# Patient Record
Sex: Female | Born: 1960 | Race: Black or African American | Hispanic: No | Marital: Married | Smoking: Former smoker
Health system: Southern US, Community
[De-identification: ages and names within clinical notes are randomized; demographics above are authoritative.]

## PROBLEM LIST (undated history)

## (undated) DIAGNOSIS — K635 Polyp of colon: Secondary | ICD-10-CM

## (undated) DIAGNOSIS — D126 Benign neoplasm of colon, unspecified: Secondary | ICD-10-CM

## (undated) DIAGNOSIS — D649 Anemia, unspecified: Secondary | ICD-10-CM

## (undated) DIAGNOSIS — I729 Aneurysm of unspecified site: Secondary | ICD-10-CM

## (undated) DIAGNOSIS — M1990 Unspecified osteoarthritis, unspecified site: Secondary | ICD-10-CM

## (undated) DIAGNOSIS — I10 Essential (primary) hypertension: Secondary | ICD-10-CM

## (undated) DIAGNOSIS — I471 Supraventricular tachycardia, unspecified: Secondary | ICD-10-CM

## (undated) DIAGNOSIS — I209 Angina pectoris, unspecified: Secondary | ICD-10-CM

## (undated) DIAGNOSIS — Z96651 Presence of right artificial knee joint: Secondary | ICD-10-CM

## (undated) DIAGNOSIS — C801 Malignant (primary) neoplasm, unspecified: Secondary | ICD-10-CM

## (undated) DIAGNOSIS — Z96652 Presence of left artificial knee joint: Secondary | ICD-10-CM

## (undated) DIAGNOSIS — R002 Palpitations: Secondary | ICD-10-CM

## (undated) DIAGNOSIS — J189 Pneumonia, unspecified organism: Secondary | ICD-10-CM

## (undated) DIAGNOSIS — E039 Hypothyroidism, unspecified: Principal | ICD-10-CM

## (undated) DIAGNOSIS — R112 Nausea with vomiting, unspecified: Secondary | ICD-10-CM

## (undated) DIAGNOSIS — M069 Rheumatoid arthritis, unspecified: Secondary | ICD-10-CM

## (undated) DIAGNOSIS — F419 Anxiety disorder, unspecified: Secondary | ICD-10-CM

## (undated) DIAGNOSIS — F329 Major depressive disorder, single episode, unspecified: Secondary | ICD-10-CM

## (undated) DIAGNOSIS — Z803 Family history of malignant neoplasm of breast: Secondary | ICD-10-CM

## (undated) DIAGNOSIS — Z9889 Other specified postprocedural states: Secondary | ICD-10-CM

## (undated) HISTORY — DX: Unspecified osteoarthritis, unspecified site: M19.90

## (undated) HISTORY — PX: BUNIONECTOMY: SHX129

## (undated) HISTORY — DX: Anemia, unspecified: D64.9

## (undated) HISTORY — DX: Benign neoplasm of colon, unspecified: D12.6

## (undated) HISTORY — DX: Palpitations: R00.2

## (undated) HISTORY — DX: Aneurysm of unspecified site: I72.9

## (undated) HISTORY — DX: Presence of left artificial knee joint: Z96.652

## (undated) HISTORY — DX: Family history of malignant neoplasm of breast: Z80.3

## (undated) HISTORY — DX: Polyp of colon: K63.5

## (undated) HISTORY — DX: Hypothyroidism, unspecified: E03.9

## (undated) HISTORY — DX: Presence of right artificial knee joint: Z96.651

## (undated) HISTORY — PX: ABDOMINAL HYSTERECTOMY: SHX81

## (undated) HISTORY — DX: Major depressive disorder, single episode, unspecified: F32.9

## (undated) HISTORY — PX: OTHER SURGICAL HISTORY: SHX169

## (undated) HISTORY — DX: Supraventricular tachycardia: I47.1

## (undated) HISTORY — PX: INGUINAL HERNIA REPAIR: SHX194

## (undated) HISTORY — PX: APPENDECTOMY: SHX54

## (undated) HISTORY — PX: HAMMER TOE SURGERY: SHX385

---
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
## 1999-09-20 HISTORY — PX: THYROIDECTOMY: SHX17

## 2004-05-10 ENCOUNTER — Emergency Department (HOSPITAL_COMMUNITY): Admission: EM | Admit: 2004-05-10 | Discharge: 2004-05-10 | Payer: Self-pay | Admitting: Emergency Medicine

## 2005-01-03 ENCOUNTER — Ambulatory Visit (HOSPITAL_COMMUNITY): Admission: RE | Admit: 2005-01-03 | Discharge: 2005-01-04 | Payer: Self-pay | Admitting: Neurosurgery

## 2006-03-09 ENCOUNTER — Emergency Department (HOSPITAL_COMMUNITY): Admission: EM | Admit: 2006-03-09 | Discharge: 2006-03-09 | Payer: Self-pay | Admitting: Family Medicine

## 2007-06-25 ENCOUNTER — Emergency Department (HOSPITAL_COMMUNITY): Admission: EM | Admit: 2007-06-25 | Discharge: 2007-06-25 | Payer: Self-pay | Admitting: Emergency Medicine

## 2007-08-14 ENCOUNTER — Emergency Department (HOSPITAL_COMMUNITY): Admission: EM | Admit: 2007-08-14 | Discharge: 2007-08-14 | Payer: Self-pay | Admitting: Emergency Medicine

## 2007-08-20 ENCOUNTER — Inpatient Hospital Stay (HOSPITAL_COMMUNITY): Admission: AD | Admit: 2007-08-20 | Discharge: 2007-08-20 | Payer: Self-pay | Admitting: Obstetrics and Gynecology

## 2007-09-07 ENCOUNTER — Emergency Department (HOSPITAL_COMMUNITY): Admission: EM | Admit: 2007-09-07 | Discharge: 2007-09-07 | Payer: Self-pay | Admitting: Sports Medicine

## 2007-09-26 ENCOUNTER — Ambulatory Visit: Payer: Self-pay | Admitting: Obstetrics and Gynecology

## 2007-10-17 ENCOUNTER — Ambulatory Visit: Payer: Self-pay | Admitting: Internal Medicine

## 2007-10-17 DIAGNOSIS — E039 Hypothyroidism, unspecified: Secondary | ICD-10-CM

## 2007-10-17 HISTORY — DX: Hypothyroidism, unspecified: E03.9

## 2007-10-18 LAB — CONVERTED CEMR LAB
ALT: 14 units/L (ref 0–35)
AST: 15 units/L (ref 0–37)
Albumin: 4 g/dL (ref 3.5–5.2)
Basophils Absolute: 0 10*3/uL (ref 0.0–0.1)
Bilirubin, Direct: 0.2 mg/dL (ref 0.0–0.3)
Calcium: 9.2 mg/dL (ref 8.4–10.5)
Chloride: 104 meq/L (ref 96–112)
Eosinophils Absolute: 0.1 10*3/uL (ref 0.0–0.6)
Eosinophils Relative: 1.8 % (ref 0.0–5.0)
GFR calc non Af Amer: 97 mL/min
HCT: 35.7 % — ABNORMAL LOW (ref 36.0–46.0)
Hemoglobin: 11.7 g/dL — ABNORMAL LOW (ref 12.0–15.0)
Lymphocytes Relative: 32.7 % (ref 12.0–46.0)
MCV: 86.5 fL (ref 78.0–100.0)
Monocytes Relative: 4.9 % (ref 3.0–11.0)
Platelets: 394 10*3/uL (ref 150–400)
RBC: 4.13 M/uL (ref 3.87–5.11)
RDW: 13.2 % (ref 11.5–14.6)
WBC: 7.3 10*3/uL (ref 4.5–10.5)

## 2007-10-23 ENCOUNTER — Encounter: Payer: Self-pay | Admitting: Internal Medicine

## 2007-10-23 ENCOUNTER — Ambulatory Visit: Payer: Self-pay

## 2007-10-30 ENCOUNTER — Telehealth: Payer: Self-pay | Admitting: Internal Medicine

## 2007-11-01 ENCOUNTER — Telehealth (INDEPENDENT_AMBULATORY_CARE_PROVIDER_SITE_OTHER): Payer: Self-pay | Admitting: *Deleted

## 2007-11-08 ENCOUNTER — Ambulatory Visit: Payer: Self-pay | Admitting: Cardiology

## 2007-12-13 ENCOUNTER — Ambulatory Visit: Payer: Self-pay | Admitting: Internal Medicine

## 2007-12-18 ENCOUNTER — Ambulatory Visit: Payer: Self-pay | Admitting: Internal Medicine

## 2008-01-25 ENCOUNTER — Ambulatory Visit: Payer: Self-pay | Admitting: Cardiology

## 2008-02-04 ENCOUNTER — Telehealth: Payer: Self-pay | Admitting: Internal Medicine

## 2008-02-05 ENCOUNTER — Ambulatory Visit: Payer: Self-pay | Admitting: Internal Medicine

## 2008-02-13 LAB — CONVERTED CEMR LAB
Albumin: 3.8 g/dL (ref 3.5–5.2)
Alkaline Phosphatase: 38 units/L — ABNORMAL LOW (ref 39–117)
Bilirubin, Direct: 0.1 mg/dL (ref 0.0–0.3)
CO2: 30 meq/L (ref 19–32)
GFR calc Af Amer: 116 mL/min
Glucose, Bld: 86 mg/dL (ref 70–99)
HCT: 34.8 % — ABNORMAL LOW (ref 36.0–46.0)
Iron: 28 ug/dL — ABNORMAL LOW (ref 42–145)
Lymphocytes Relative: 32.4 % (ref 12.0–46.0)
MCHC: 32.3 g/dL (ref 30.0–36.0)
Monocytes Absolute: 0.4 10*3/uL (ref 0.1–1.0)
Monocytes Relative: 6.4 % (ref 3.0–12.0)
Neutrophils Relative %: 59.1 % (ref 43.0–77.0)
Potassium: 4 meq/L (ref 3.5–5.1)
RDW: 14.6 % (ref 11.5–14.6)
Sodium: 140 meq/L (ref 135–145)
TSH: 17.67 microintl units/mL — ABNORMAL HIGH (ref 0.35–5.50)
Total Protein: 6.8 g/dL (ref 6.0–8.3)
Transferrin: 348.7 mg/dL (ref >=212.0)

## 2008-03-20 ENCOUNTER — Ambulatory Visit: Payer: Self-pay | Admitting: Obstetrics & Gynecology

## 2008-03-20 ENCOUNTER — Ambulatory Visit (HOSPITAL_COMMUNITY): Admission: RE | Admit: 2008-03-20 | Discharge: 2008-03-20 | Payer: Self-pay | Admitting: Obstetrics & Gynecology

## 2008-03-24 ENCOUNTER — Ambulatory Visit (HOSPITAL_COMMUNITY): Admission: RE | Admit: 2008-03-24 | Discharge: 2008-03-24 | Payer: Self-pay | Admitting: Obstetrics & Gynecology

## 2008-03-24 ENCOUNTER — Encounter: Payer: Self-pay | Admitting: Obstetrics & Gynecology

## 2008-03-24 ENCOUNTER — Ambulatory Visit: Payer: Self-pay | Admitting: Obstetrics & Gynecology

## 2008-03-26 ENCOUNTER — Ambulatory Visit: Payer: Self-pay | Admitting: Internal Medicine

## 2008-03-30 ENCOUNTER — Telehealth: Payer: Self-pay | Admitting: Internal Medicine

## 2008-04-02 ENCOUNTER — Ambulatory Visit: Payer: Self-pay | Admitting: Internal Medicine

## 2008-04-03 LAB — CONVERTED CEMR LAB
Lymphocytes Relative: 27.8 % (ref 12.0–46.0)
MCHC: 32.1 g/dL (ref 30.0–36.0)
Monocytes Absolute: 0.5 10*3/uL (ref 0.1–1.0)
Monocytes Relative: 9.3 % (ref 3.0–12.0)
Neutro Abs: 3.6 10*3/uL (ref 1.4–7.7)

## 2008-04-16 ENCOUNTER — Ambulatory Visit (HOSPITAL_COMMUNITY): Admission: RE | Admit: 2008-04-16 | Discharge: 2008-04-16 | Payer: Self-pay | Admitting: Obstetrics & Gynecology

## 2008-04-16 ENCOUNTER — Ambulatory Visit: Payer: Self-pay | Admitting: Internal Medicine

## 2008-04-16 LAB — CONVERTED CEMR LAB
Basophils Absolute: 0 10*3/uL (ref 0.0–0.1)
Eosinophils Absolute: 0.1 10*3/uL (ref 0.0–0.7)
HCT: 34 % — ABNORMAL LOW (ref 36.0–46.0)
Hemoglobin: 11.4 g/dL — ABNORMAL LOW (ref 12.0–15.0)
Iron: 130 ug/dL (ref 42–145)
MCHC: 33.6 g/dL (ref 30.0–36.0)
MCV: 86.7 fL (ref 78.0–100.0)
Monocytes Absolute: 0.4 10*3/uL (ref 0.1–1.0)
Monocytes Relative: 5.6 % (ref 3.0–12.0)
Platelets: 375 10*3/uL (ref 150–400)
RDW: 14.5 % (ref 11.5–14.6)
Saturation Ratios: 29.2 % (ref 20.0–50.0)

## 2008-04-17 ENCOUNTER — Ambulatory Visit: Payer: Self-pay | Admitting: Obstetrics and Gynecology

## 2008-04-23 ENCOUNTER — Ambulatory Visit: Payer: Self-pay | Admitting: Internal Medicine

## 2008-04-23 DIAGNOSIS — F329 Major depressive disorder, single episode, unspecified: Secondary | ICD-10-CM | POA: Insufficient documentation

## 2008-04-23 DIAGNOSIS — F3289 Other specified depressive episodes: Secondary | ICD-10-CM

## 2008-04-23 DIAGNOSIS — D649 Anemia, unspecified: Secondary | ICD-10-CM

## 2008-04-23 HISTORY — DX: Major depressive disorder, single episode, unspecified: F32.9

## 2008-04-23 HISTORY — DX: Other specified depressive episodes: F32.89

## 2008-04-23 HISTORY — DX: Anemia, unspecified: D64.9

## 2008-04-28 ENCOUNTER — Encounter: Payer: Self-pay | Admitting: Internal Medicine

## 2008-05-15 ENCOUNTER — Ambulatory Visit: Payer: Self-pay | Admitting: Obstetrics & Gynecology

## 2008-06-22 ENCOUNTER — Emergency Department (HOSPITAL_COMMUNITY): Admission: EM | Admit: 2008-06-22 | Discharge: 2008-06-22 | Payer: Self-pay | Admitting: Emergency Medicine

## 2008-06-23 ENCOUNTER — Telehealth: Payer: Self-pay | Admitting: Internal Medicine

## 2008-06-24 ENCOUNTER — Ambulatory Visit: Payer: Self-pay | Admitting: Internal Medicine

## 2008-06-30 ENCOUNTER — Other Ambulatory Visit: Payer: Self-pay | Admitting: Obstetrics and Gynecology

## 2008-07-01 ENCOUNTER — Encounter: Payer: Self-pay | Admitting: Obstetrics & Gynecology

## 2008-07-01 ENCOUNTER — Ambulatory Visit: Payer: Self-pay | Admitting: Obstetrics & Gynecology

## 2008-07-01 ENCOUNTER — Inpatient Hospital Stay (HOSPITAL_COMMUNITY): Admission: RE | Admit: 2008-07-01 | Discharge: 2008-07-02 | Payer: Self-pay | Admitting: Obstetrics & Gynecology

## 2008-07-18 ENCOUNTER — Ambulatory Visit: Payer: Self-pay | Admitting: Obstetrics & Gynecology

## 2008-08-19 ENCOUNTER — Ambulatory Visit: Payer: Self-pay | Admitting: Internal Medicine

## 2008-08-21 LAB — CONVERTED CEMR LAB
AST: 18 units/L (ref 0–37)
Bilirubin, Direct: 0.1 mg/dL (ref 0.0–0.3)
Chloride: 105 meq/L (ref 96–112)
Eosinophils Absolute: 0.1 10*3/uL (ref 0.0–0.7)
Eosinophils Relative: 0.8 % (ref 0.0–5.0)
GFR calc Af Amer: 100 mL/min
GFR calc non Af Amer: 82 mL/min
MCV: 84.1 fL (ref 78.0–100.0)
Monocytes Absolute: 0.3 10*3/uL (ref 0.1–1.0)
Potassium: 3.8 meq/L (ref 3.5–5.1)
Rhuematoid fact SerPl-aCnc: <20 intl units/mL — ABNORMAL LOW (ref 0.0–20.0)
Sodium: 138 meq/L (ref 135–145)
Total Bilirubin: 0.5 mg/dL (ref 0.3–1.2)
Total Protein: 7.1 g/dL (ref 6.0–8.3)
WBC: 7.2 10*3/uL (ref 4.5–10.5)

## 2008-09-04 ENCOUNTER — Encounter: Payer: Self-pay | Admitting: Internal Medicine

## 2008-11-06 ENCOUNTER — Encounter: Payer: Self-pay | Admitting: Internal Medicine

## 2008-12-23 ENCOUNTER — Emergency Department (HOSPITAL_COMMUNITY): Admission: EM | Admit: 2008-12-23 | Discharge: 2008-12-23 | Payer: Self-pay | Admitting: Emergency Medicine

## 2009-02-10 ENCOUNTER — Encounter: Payer: Self-pay | Admitting: Internal Medicine

## 2009-03-02 ENCOUNTER — Encounter: Payer: Self-pay | Admitting: Internal Medicine

## 2009-04-13 ENCOUNTER — Ambulatory Visit: Payer: Self-pay | Admitting: Internal Medicine

## 2009-04-14 ENCOUNTER — Telehealth: Payer: Self-pay | Admitting: Internal Medicine

## 2009-04-15 LAB — CONVERTED CEMR LAB
AST: 22 units/L (ref 0–37)
Alkaline Phosphatase: 37 units/L — ABNORMAL LOW (ref 39–117)
BUN: 11 mg/dL (ref 6–23)
Basophils Absolute: 0.1 10*3/uL (ref 0.0–0.1)
Basophils Relative: 0.8 % (ref 0.0–3.0)
Bilirubin, Direct: 0.1 mg/dL (ref 0.0–0.3)
Creatinine, Ser: 0.7 mg/dL (ref 0.4–1.2)
Eosinophils Absolute: 0.1 10*3/uL (ref 0.0–0.7)
GFR calc non Af Amer: 115.63 mL/min (ref >60)
HCT: 37.9 % (ref 36.0–46.0)
Hemoglobin: 12.6 g/dL (ref 12.0–15.0)
Lymphocytes Relative: 24.3 % (ref 12.0–46.0)
MCV: 88.6 fL (ref 78.0–100.0)
Monocytes Absolute: 0.4 10*3/uL (ref 0.1–1.0)
Neutro Abs: 4.8 10*3/uL (ref 1.4–7.7)
Neutrophils Relative %: 68.5 % (ref 43.0–77.0)
Platelets: 344 10*3/uL (ref 150.0–400.0)
TSH: 6.64 microintl units/mL — ABNORMAL HIGH (ref 0.35–5.50)
WBC: 7.1 10*3/uL (ref 4.5–10.5)

## 2009-04-28 ENCOUNTER — Telehealth: Payer: Self-pay | Admitting: Internal Medicine

## 2009-05-29 ENCOUNTER — Encounter: Payer: Self-pay | Admitting: Internal Medicine

## 2009-06-01 ENCOUNTER — Encounter: Admission: RE | Admit: 2009-06-01 | Discharge: 2009-06-01 | Payer: Self-pay | Admitting: Rheumatology

## 2009-06-29 ENCOUNTER — Ambulatory Visit: Payer: Self-pay | Admitting: Family Medicine

## 2009-06-29 DIAGNOSIS — R634 Abnormal weight loss: Secondary | ICD-10-CM | POA: Insufficient documentation

## 2009-06-29 LAB — CONVERTED CEMR LAB
Blood Glucose, Fingerstick: 91
Glucose, Urine, Semiquant: NEGATIVE
Specific Gravity, Urine: >=1.03
Urobilinogen, UA: 1
pH: 5

## 2009-07-01 LAB — CONVERTED CEMR LAB
ALT: 9 units/L (ref 0–35)
AST: 13 units/L (ref 0–37)
Albumin: 4.1 g/dL (ref 3.5–5.2)
Alkaline Phosphatase: 36 units/L — ABNORMAL LOW (ref 39–117)
Basophils Absolute: 0.1 10*3/uL (ref 0.0–0.1)
Basophils Relative: 1.1 % (ref 0.0–3.0)
Bilirubin, Direct: 0.1 mg/dL (ref 0.0–0.3)
CO2: 28 meq/L (ref 19–32)
Eosinophils Absolute: 0 10*3/uL (ref 0.0–0.7)
Eosinophils Relative: 0.8 % (ref 0.0–5.0)
GFR calc non Af Amer: 99.02 mL/min (ref >60)
Hemoglobin: 13.5 g/dL (ref 12.0–15.0)
Lymphs Abs: 2 10*3/uL (ref 0.7–4.0)
MCHC: 33.1 g/dL (ref 30.0–36.0)
Monocytes Absolute: 0.4 10*3/uL (ref 0.1–1.0)
Neutro Abs: 3.5 10*3/uL (ref 1.4–7.7)
Potassium: 4.2 meq/L (ref 3.5–5.1)
RBC: 4.52 M/uL (ref 3.87–5.11)
Sed Rate: 22 mm/hr (ref 0–22)
Sodium: 142 meq/L (ref 135–145)
Total Protein: 7.2 g/dL (ref 6.0–8.3)
WBC: 6 10*3/uL (ref 4.5–10.5)

## 2009-07-02 ENCOUNTER — Ambulatory Visit: Payer: Self-pay | Admitting: Family Medicine

## 2009-07-08 ENCOUNTER — Ambulatory Visit: Payer: Self-pay | Admitting: Internal Medicine

## 2009-07-10 ENCOUNTER — Telehealth (INDEPENDENT_AMBULATORY_CARE_PROVIDER_SITE_OTHER): Payer: Self-pay | Admitting: *Deleted

## 2009-07-13 ENCOUNTER — Encounter (INDEPENDENT_AMBULATORY_CARE_PROVIDER_SITE_OTHER): Payer: Self-pay | Admitting: *Deleted

## 2009-07-16 ENCOUNTER — Ambulatory Visit: Payer: Self-pay | Admitting: Cardiology

## 2009-07-21 ENCOUNTER — Telehealth: Payer: Self-pay | Admitting: Internal Medicine

## 2009-07-24 ENCOUNTER — Telehealth: Payer: Self-pay | Admitting: Internal Medicine

## 2009-08-04 ENCOUNTER — Emergency Department (HOSPITAL_COMMUNITY): Admission: EM | Admit: 2009-08-04 | Discharge: 2009-08-04 | Payer: Self-pay | Admitting: Emergency Medicine

## 2009-08-22 ENCOUNTER — Emergency Department (HOSPITAL_COMMUNITY): Admission: EM | Admit: 2009-08-22 | Discharge: 2009-08-22 | Payer: Self-pay | Admitting: Emergency Medicine

## 2009-09-30 ENCOUNTER — Encounter: Payer: Self-pay | Admitting: Internal Medicine

## 2009-10-27 ENCOUNTER — Ambulatory Visit: Payer: Self-pay | Admitting: Cardiology

## 2009-10-27 DIAGNOSIS — I471 Supraventricular tachycardia, unspecified: Secondary | ICD-10-CM

## 2009-10-27 HISTORY — DX: Supraventricular tachycardia, unspecified: I47.10

## 2009-10-27 HISTORY — DX: Supraventricular tachycardia: I47.1

## 2009-11-12 ENCOUNTER — Ambulatory Visit: Payer: Self-pay | Admitting: Cardiology

## 2009-11-17 LAB — CONVERTED CEMR LAB
Cholesterol: 169 mg/dL (ref 0–200)
HDL: 42.5 mg/dL (ref >39.00)
Total CHOL/HDL Ratio: 4
Triglycerides: 100 mg/dL (ref 0.0–149.0)

## 2009-11-26 ENCOUNTER — Ambulatory Visit: Payer: Self-pay | Admitting: Cardiology

## 2009-11-30 LAB — CONVERTED CEMR LAB
Free T4: 0.7 ng/dL (ref 0.6–1.6)
T3, Free: 2.5 pg/mL (ref 2.3–4.2)

## 2009-12-14 ENCOUNTER — Telehealth (INDEPENDENT_AMBULATORY_CARE_PROVIDER_SITE_OTHER): Payer: Self-pay | Admitting: *Deleted

## 2009-12-17 ENCOUNTER — Telehealth: Payer: Self-pay | Admitting: Cardiology

## 2010-01-12 ENCOUNTER — Encounter: Payer: Self-pay | Admitting: Internal Medicine

## 2010-01-13 ENCOUNTER — Encounter: Payer: Self-pay | Admitting: Internal Medicine

## 2010-01-20 ENCOUNTER — Ambulatory Visit: Payer: Self-pay | Admitting: Internal Medicine

## 2010-01-20 ENCOUNTER — Encounter (INDEPENDENT_AMBULATORY_CARE_PROVIDER_SITE_OTHER): Payer: Self-pay | Admitting: *Deleted

## 2010-01-20 LAB — CONVERTED CEMR LAB
Albumin: 3.9 g/dL (ref 3.5–5.2)
Alkaline Phosphatase: 46 units/L (ref 39–117)
BUN: 8 mg/dL (ref 6–23)
Basophils Absolute: 0 10*3/uL (ref 0.0–0.1)
Basophils Relative: 0.5 % (ref 0.0–3.0)
Calcium: 8.7 mg/dL (ref 8.4–10.5)
Eosinophils Absolute: 0.1 10*3/uL (ref 0.0–0.7)
Eosinophils Relative: 1.2 % (ref 0.0–5.0)
Hemoglobin: 12.9 g/dL (ref 12.0–15.0)
Lymphocytes Relative: 28.7 % (ref 12.0–46.0)
Lymphs Abs: 2 10*3/uL (ref 0.7–4.0)
MCHC: 33.7 g/dL (ref 30.0–36.0)
MCV: 88.3 fL (ref 78.0–100.0)
Monocytes Absolute: 0.3 10*3/uL (ref 0.1–1.0)
Monocytes Relative: 5.1 % (ref 3.0–12.0)
Potassium: 4.3 meq/L (ref 3.5–5.1)
RDW: 13.2 % (ref 11.5–14.6)
Sodium: 143 meq/L (ref 135–145)
Total Protein: 6.4 g/dL (ref 6.0–8.3)
WBC: 6.9 10*3/uL (ref 4.5–10.5)

## 2010-01-22 LAB — CONVERTED CEMR LAB

## 2010-03-03 ENCOUNTER — Ambulatory Visit: Payer: Self-pay | Admitting: Internal Medicine

## 2010-03-03 LAB — CONVERTED CEMR LAB: T3, Free: 2.6 pg/mL (ref 2.3–4.2)

## 2010-03-10 ENCOUNTER — Ambulatory Visit: Payer: Self-pay | Admitting: Internal Medicine

## 2010-03-24 ENCOUNTER — Ambulatory Visit: Payer: Self-pay | Admitting: Gastroenterology

## 2010-04-29 ENCOUNTER — Ambulatory Visit: Payer: Self-pay | Admitting: Internal Medicine

## 2010-04-29 LAB — CONVERTED CEMR LAB: TSH: 2.94 microintl units/mL (ref 0.35–5.50)

## 2010-09-23 ENCOUNTER — Ambulatory Visit
Admission: RE | Admit: 2010-09-23 | Discharge: 2010-09-23 | Payer: Self-pay | Source: Home / Self Care | Attending: Internal Medicine | Admitting: Internal Medicine

## 2010-09-30 ENCOUNTER — Encounter: Payer: Self-pay | Admitting: Internal Medicine

## 2010-10-10 ENCOUNTER — Encounter: Payer: Self-pay | Admitting: *Deleted

## 2010-10-10 ENCOUNTER — Encounter: Payer: Self-pay | Admitting: Internal Medicine

## 2010-10-21 NOTE — Progress Notes (Signed)
Summary: monitor results 11/12/09-12/02/09   Phone Note Outgoing Call   Call placed by: Katina Dung, RN, BSN,  December 17, 2009 12:47 PM Call placed to: Patient Summary of Call: monitor results  Follow-up for Phone Call        discussed results of monitor 11/12/09-12/02/09(NSR,no significant problems) reviewed by Dr Shirlee Latch  with pt by telephone

## 2010-10-21 NOTE — Letter (Signed)
Summary: Rheumatology-Dr. Stacey Drain  Rheumatology-Dr. Stacey Drain   Imported By: Maryln Gottron 01/21/2010 15:33:14  _____________________________________________________________________  External Attachment:    Type:   Image     Comment:   External Document

## 2010-10-21 NOTE — Progress Notes (Signed)
   Faxed [facility] over to [employer] to Joycelyn Schmid to fax [phone] [facility]  December 14, 2009 9:39 AM

## 2010-10-21 NOTE — Assessment & Plan Note (Signed)
Summary: low grade fever/bodyaches/njr   Vital Signs:  Patient profile:   50 year old female Menstrual status:  hysterectomy Weight:      143 pounds BMI:     26.04 Temp:     98.6 degrees F oral Pulse rate:   88 / minute Pulse rhythm:   regular Resp:     12 per minute BP sitting:   122 / 80  (left arm) Cuff size:   regular  Vitals Entered By: Gladis Riffle, RN (Jan 20, 2010 11:08 AM) CC: c/o low grade fever and body aches intermittent x few weeks--began as couple times a month to more frequent now Is Patient Diabetic? No   Primary Care Provider:  Glendora Clouatre  CC:  c/o low grade fever and body aches intermittent x few weeks--began as couple times a month to more frequent now.  History of Present Illness: She describes intermittent sxs of aching, and fever (she reports 102-max) no headache, no specific sxs: no rash, joint effusions, urinary, gi, cv, pulmonary sxs sxs ongoing for 1-2 years, worse past month  All other systems reviewed and were negative   Preventive Screening-Counseling & Management  Alcohol-Tobacco     Alcohol drinks/day: <1     Smoking Status: current     Smoking Cessation Counseling: yes     Packs/Day: 0.5     Year Started: 1989  Current Medications (verified): 1)  Levothyroxine Sodium 175 Mcg Tabs (Levothyroxine Sodium) .... Take 1 Tablet By Mouth Once A Day  Allergies: 1)  ! Tramadol Hcl (Tramadol Hcl) 2)  ! Vicodin  Physical Exam  General:  alert and well-developed.   Head:  normocephalic and atraumatic.   Eyes:  pupils equal and pupils round.   Ears:  R ear normal and L ear normal.   Neck:  No deformities, masses, or tenderness noted.no adenopathy noted Chest Wall:  No deformities, masses, or tenderness noted. Lungs:  normal respiratory effort and no intercostal retractions.   Heart:  normal rate and regular rhythm.   Abdomen:  she has mild midepigastric tenderness otherwise exam is unremarkable. No organomegaly noted Msk:  No deformity or  scoliosis noted of thoracic or lumbar spine.   Pulses:  R radial normal and L radial normal.   Skin:  no rash Psych:  normally interactive and good eye contact.     Impression & Recommendations:  Problem # 1:  FATIGUE (ICD-780.79)  sxs of fatigue, aching and fever (her report) reviewed records including labs and imaging  CT abnormality---re-refer GI---needs colonoscopy (some of her sxs could be explained by tumor---highly doubt)   Orders: TLB-BMP (Basic Metabolic Panel-BMET) (80048-METABOL) TLB-Hepatic/Liver Function Pnl (80076-HEPATIC) TLB-CBC Platelet - w/Differential (85025-CBCD) TLB-TSH (Thyroid Stimulating Hormone) (84443-TSH) Venipuncture (04540) Sedimentation Rate, non-automated (98119) T-HIV Antibody  (Reflex) ([id number])  Complete Medication List: 1)  Levothyroxine Sodium 175 Mcg Tabs (Levothyroxine sodium) .... Take 1 tablet by mouth once a day  Other Orders: Gastroenterology Referral (GI)  Appended Document: Orders Update     Clinical Lists Changes  Observations: Added new observation of COMMENTS2: Wynona Canes, CMA  Jan 20, 2010 12:27 PM  (01/20/2010 12:27)      Laboratory Results   Blood Tests   Date/Time Recieved: Jan 20, 2010 12:27 PM  Date/Time Reported: Jan 20, 2010 12:27 PM   SED rate: 10  Comments: Wynona Canes, CMA  Jan 20, 2010 12:27 PM

## 2010-10-21 NOTE — Assessment & Plan Note (Signed)
Summary: painful warts on fingers/cjr   Vital Signs:  Patient profile:   50 year old female Menstrual status:  hysterectomy Weight:      155 pounds Temp:     98.8 degrees F oral Pulse rate:   76 / minute Pulse rhythm:   regular BP sitting:   114 / 74  (left arm) Cuff size:   regular  Vitals Entered By: Alfred Levins, CMA (September 23, 2010 9:45 AM) CC: painful warts on hands and feet   Primary Care Provider:  Kjersten Ormiston  CC:  painful warts on hands and feet.  History of Present Illness:  recurrent warts on fingers. Both hands. Sometimes they're painful because of their location. They typically occur in the flexor aspect of the fingers. she denies any surrounding erythema. No other signs of cellulitis.  She denies other complaints.  Current Medications (verified): 1)  Levothyroxine Sodium 200 Mcg  Tabs (Levothyroxine Sodium) .Marland Kitchen.. 1 By Mouth Daily  Allergies (verified): 1)  ! Tramadol Hcl (Tramadol Hcl) 2)  ! Vicodin  Past History:  Past Medical History: Last updated: 11/26/2009 1. 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 Osteoarthritis 3. Hypertension 4. Anemia-NOS 5. Depression 8/09: no meds, no history of suicidal ideation 6. Palpitations: Holter monitor in past showed PACs/PVCs. Event monitor 3/11 showed no significant arrhythmia.   7. Active smoker 8. appendectomy  Past Surgical History: Last updated: 03/10/2010 Inguinal herniorrhaphy Thyroidectomy  2001 for nodules Appendectomy neck fusion X 3  right bunionectomy feb 2011 left bunionectomy may 18,2011  Family History: Last updated: 10/27/2009 father- alzheimers, ? hx of head and neck surgery mother- deceased unknown cancer 55 yo Family History Hypertension-father, mother Grandmother with MI in her 10s and CVA  Social History: Last updated: 10/27/2009 Married 2 healthy kids Current Smoker: 1/2 ppd Alcohol use-yes Regular exercise-no  Risk Factors: Alcohol Use: <1  (01/20/2010) Exercise: no (10/17/2007)  Risk Factors: Smoking Status: current (03/10/2010) Packs/Day: 0.25 (03/10/2010)  Physical Exam  General:   several hypertrophic warts on the flexor aspects of fingers bilaterally. 2 a quite large.   Impression & Recommendations:  Problem # 1:  WARTS, MULTIPLE (ICD-078.10)   I think given the hypertrophic nature she needs to have these frozen. This was done in the office after informed consent. I also asked her to use over-the-counter salicylic acid if they recur. she will call if symptoms persist.  Orders: Cryotherapy/Destruction benign or premalignant lesion (1st lesion)  (17000) Cryotherapy/Destruction benign or premalignant lesion (2nd-14th lesions) (17003)  Complete Medication List: 1)  Levothyroxine Sodium 200 Mcg Tabs (Levothyroxine sodium) .Marland Kitchen.. 1 by mouth daily   Orders Added: 1)  Est. Patient Level II [[address]] 2)  Cryotherapy/Destruction benign or premalignant lesion (1st lesion)  [17000] 3)  Cryotherapy/Destruction benign or premalignant lesion (2nd-14th lesions) [17003]

## 2010-10-21 NOTE — Assessment & Plan Note (Signed)
Summary: 3 month rov/et   Vital Signs:  Patient profile:   50 year old female Menstrual status:  hysterectomy Weight:      144 pounds Temp:     98.9 degrees F oral Pulse rate:   88 / minute Pulse rhythm:   regular Resp:     12 per minute BP sitting:   136 / 88  (left arm) Cuff size:   regular  Vitals Entered By: Gladis Riffle, RN (March 10, 2010 11:40 AM) CC: 3 month rov, labs done--sees GI beginning next month Is Patient Diabetic? No   Primary Care Provider:  Saranya Harlin  CC:  3 month rov and labs done--sees GI beginning next month.  History of Present Illness:  Follow-Up Visit      This is a 50 year old woman who presents for Follow-up visit.  The patient denies chest pain and palpitations.  Since the last visit the patient notes no new problems or concerns.  The patient reports taking meds as prescribed.  When questioned about possible medication side effects, the patient notes none.    All other systems reviewed and were negative   Preventive Screening-Counseling & Management  Alcohol-Tobacco     Smoking Status: current     Packs/Day: 0.25  Current Problems (verified): 1)  Pat  (ICD-427.0) 2)  Loss of Weight  (ICD-783.21) 3)  Depression  (ICD-311) 4)  Anemia-nos  (ICD-285.9) 5)  Hypothyroidism  (ICD-244.9)  Current Medications (verified): 1)  Levothyroxine Sodium 175 Mcg Tabs (Levothyroxine Sodium) .... Take 1 Tablet By Mouth Once A Day  Allergies: 1)  ! Tramadol Hcl (Tramadol Hcl) 2)  ! Vicodin  Past History:  Past Surgical History: Inguinal herniorrhaphy Thyroidectomy  2001 for nodules Appendectomy neck fusion X 3  right bunionectomy feb 2011 left bunionectomy may 18,2011  Social History: Packs/Day:  0.25  Physical Exam  General:  Well-developed,well-nourished,in no acute distress; alert,appropriate and cooperative throughout examination Head:  normocephalic and atraumatic.   Eyes:  pupils equal and pupils round.   Neck:  No deformities, masses, or  tenderness noted.no adenopathy noted Chest Wall:  No deformities, masses, or tenderness noted. Heart:  normal rate and regular rhythm.     Impression & Recommendations:  Problem # 1:  LOSS OF WEIGHT (ICD-783.21)  Problem # 2:  HYPOTHYROIDISM (ICD-244.9)  The following medications were removed from the medication list:    Levothyroxine Sodium 175 Mcg Tabs (Levothyroxine sodium) .Marland Kitchen... Take 1 tablet by mouth once a day Her updated medication list for this problem includes:    Levothyroxine Sodium 200 Mcg Tabs (Levothyroxine sodium) .Marland Kitchen... 1 by mouth daily  Labs Reviewed: TSH: 10.15 (03/03/2010)   Free T4: 0.90 (03/03/2010)    Chol: 169 (11/12/2009)   HDL: 42.50 (11/12/2009)   LDL: 107 (11/12/2009)   TG: 100.0 (11/12/2009)  Complete Medication List: 1)  Levothyroxine Sodium 200 Mcg Tabs (Levothyroxine sodium) .Marland Kitchen.. 1 by mouth daily  Other Orders: Tdap => 53yrs IM (91478) Admin 1st Vaccine (29562)  Patient Instructions: 1)  tsh in 6 weeks: no office visit Prescriptions: LEVOTHYROXINE SODIUM 200 MCG  TABS (LEVOTHYROXINE SODIUM) 1 by mouth daily  #90 x 3   Entered and Authorized by:   Birdie Sons MD   Signed by:   Birdie Sons MD on 03/10/2010   Method used:   Electronically to        [facility] (retail)       [address]  [place], [place]  [id number]       Ph: [phone]       Fax: [phone]   RxID:   [id number] LEVOTHYROXINE SODIUM 200 MCG  TABS (LEVOTHYROXINE SODIUM) 1 by mouth daily  #90 x 3   Entered and Authorized by:   Birdie Sons MD   Signed by:   Birdie Sons MD on 03/10/2010   Method used:   Electronically to        [facility]  [address] [phone][facility] (retail)       [address]       [place], [place]  [address]       Ph: [phone]       Fax: [phone]   RxID:   [id number]    Immunizations Administered:  Tetanus Vaccine:    Vaccine Type: Tdap    Site: right deltoid    Mfr: GlaxoSmithKline     Dose: 0.5 ml    Route: IM    Given by: Gladis Riffle, RN    Exp. Date: 12/11/2011    Lot #: [id number]

## 2010-10-21 NOTE — Letter (Signed)
Summary: New Patient letter  [facility]  [address] [place], [place] [address]   Phone: [phone]  Fax: [phone]       01/20/2010 MRN: [id number]  Iasha BRACHER [address] [place], [place]  [address]  Dear Ms. Laural Benes,  Welcome to the Gastroenterology Division at [facility].    You are scheduled to see Dr.  Christella Hartigan on 02-17-10 at 2:30pm on the 3rd floor at [facility], [address]  We ask that you try to arrive at our office 15 minutes prior to your appointment time to allow for check-in.  We would like you to complete the enclosed self-administered evaluation form prior to your visit and bring it with you on the day of your appointment.  We will review it with you.  Also, please bring a complete list of all your medications or, if you prefer, bring the medication bottles and we will list them.  Please bring your insurance card so that we may make a copy of it.  If your insurance requires a referral to see a specialist, please bring your referral form from your primary care physician.  Co-payments are due at the time of your visit and may be paid by cash, check or credit card.     Your office visit will consist of a consult with your physician (includes a physical exam), any laboratory testing he/she may order, scheduling of any necessary diagnostic testing (e.g. x-ray, ultrasound, CT-scan), and scheduling of a procedure (e.g. Endoscopy, Colonoscopy) if required.  Please allow enough time on your schedule to allow for any/all of these possibilities.    If you cannot keep your appointment, please call [phone] to cancel or reschedule prior to your appointment date.  This allows [place] the opportunity to schedule an appointment for another patient in need of care.  If you do not cancel or reschedule by 5 p.m. the business day prior to your appointment date, you will be charged a $50.00 late cancellation/no-show fee.    Thank you for choosing [facility] for your medical needs.  We appreciate the opportunity to care for you.  Please visit [place] at our website  to learn more about our practice.                     Sincerely,                                                             The Gastroenterology Division

## 2010-10-21 NOTE — Assessment & Plan Note (Signed)
Summary: 1 month rov      Allergies Added:   Primary Provider:  [facility]  CC:  1 month rov. pt feeling good.  recovering from foot surgery. Pt is still using monitor.  Her thyroid level was up last time.  Pt wanting to get repeat labs today.  Pt still smoking.  She has not picked up chantix.  Marland Kitchen  History of Present Illness: 50 yo with history of hypothyroidism and palpitations returns for evaluation of rapid heart rate.  She formerly saw Dr. Diona Browner for palpitations and was found to have PACs/PVCs.  She presened at last appointment for evaluation of rapid heart rate.  She has episodes every week or so where she feels like her heart is running too fast.  It seems to be regular.  She gets somewhat lightheaded but never passes out.  She has had these episodes ever since her thyroidectomy in 2001 but they are getting more frequent now.  Sometimes she feels like the fast heart rate lasts all day.  Sometimes it wakes her up from sleep.  No exertional chest pain or exertional shortness of breath.  She is smoking about 1/2 ppd and has not filled her Chantix prescription.    Since last appointment, cutting back on caffeine has helped the palpitations considerably.  Event monitor showed no significant arrhythmias.    Labs (10/10): creatinine 0.8 Labs (3/11): TSH 6.18 (elevated), LDL 107, HDL 43  Current Medications (verified): 1)  Levothyroxine Sodium 175 Mcg Tabs (Levothyroxine Sodium) .... Take 1 Tablet By Mouth Once A Day  Allergies (verified): 1)  ! Tramadol Hcl (Tramadol Hcl) 2)  ! Vicodin  Past History:  Past Medical History: 1. 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 Osteoarthritis 3. Hypertension 4. Anemia-NOS 5. Depression 8/09: no meds, no history of suicidal ideation 6. Palpitations: Holter monitor in past showed PACs/PVCs. Event monitor 3/11 showed no significant arrhythmia.   7. Active smoker 8. appendectomy  Family History: Reviewed history from  10/27/2009 and no changes required. father- alzheimers, ? hx of head and neck surgery mother- deceased unknown cancer 62 yo Family History Hypertension-father, mother Grandmother with MI in her 20s and CVA  Social History: Reviewed history from 10/27/2009 and no changes required. Married 2 healthy kids Current Smoker: 1/2 ppd Alcohol use-yes Regular exercise-no  Vital Signs:  Patient profile:   50 year old female Menstrual status:  hysterectomy Height:      62.25 inches Weight:      145 pounds BMI:     26.40 Pulse rate:   88 / minute Pulse rhythm:   regular BP sitting:   132 / 88  (left arm) Cuff size:   regular  Vitals Entered By: Judithe Modest CMA (November 26, 2009 10:37 AM)  Physical Exam  General:  Well developed, well nourished, in no acute distress. Neck:  Neck supple, no JVD. No masses, thyromegaly or abnormal cervical nodes. Lungs:  Clear bilaterally to auscultation and percussion. Heart:  Non-displaced PMI, chest non-tender; regular rate and rhythm, S1, S2 without murmurs, rubs or gallops. Carotid upstroke normal, no bruit.  Pedals normal pulses. No edema, no varicosities. Abdomen:  Bowel sounds positive; abdomen soft and non-tender without masses, organomegaly, or hernias noted. No hepatosplenomegaly. Extremities:  No clubbing or cyanosis. Neurologic:  Alert and oriented x 3. Psych:  Normal affect.    Impression & Recommendations:  Problem # 1:  PALPITATIONS (ICD-785.1) Patient has been having frequent palpitations.  These have been considerably improved by cutting back on  caffeine.  Event monitor showed no significant arrhythmia.  I expect that she is having PACs and/or PVCs.  At last appointment, we checked TSH and found it to be mildly elevated.  I will repeat TSH along with free T4 and free T3.  I will fax the results to Dr. Cato Mulligan.   Problem # 2:  SMOKING Patient needs to quit.  I have sent a Chantix prescription to the pharmacy for her and counselled her  to quit.    Other Orders: TLB-TSH (Thyroid Stimulating Hormone) (84443-TSH) TLB-T3, Free (Triiodothyronine) (84481-T3FREE) TLB-T4 (Thyrox), Free [phone])  Patient Instructions: 1)  Your physician recommends that you schedule a follow-up appointment in: one year with Dr. Shirlee Latch. 2)  Your physician recommends that you getr lab work today. 3)  Your physician recommends that you continue on your current medications as directed. Please refer to the Current Medication list given to you today.

## 2010-10-21 NOTE — Consult Note (Signed)
Summary: [facility]   Imported By: Maryln Gottron 10/07/2009 13:29:06  _____________________________________________________________________  External Attachment:    Type:   Image     Comment:   External Document

## 2010-10-21 NOTE — Assessment & Plan Note (Signed)
Summary: [facility]/Former pt of Dr.McDowell-[facility]  Medications Added CHANTIX STARTING MONTH PAK 0.5 MG X 11 & 1 MG X 42 TABS (VARENICLINE TARTRATE) take as directed CHANTIX CONTINUING MONTH PAK 1 MG TABS (VARENICLINE TARTRATE) take as directed      Allergies Added: ! VICODIN  Primary Provider:  Swords  CC:  new patient/former pt of McDowell.  Pt reports fast heartbeat since thyroid removed.  This tachycardia wakes her from her sleep and sometimes she states " it beats so fast my face gets numb and she gets hot and dizzy.  History of Present Illness: 50 yo with history of hypothyroidism and palpitations presents for evaluation of rapid heart rate.  She formerly saw Dr. Diona Browner for palpitations and was found to have PACs/PVCs.  She presents today for evaluation of rapid heart rate.  She has episodes every week or so where she feels like her heart is running too fast.  It seems to be regular.  She gets somewhat lightheaded but never passes out.  She has had these episodes ever since her thyroidectomy in 2001 but they are getting more frequent now.  Sometimes she feels like the fast heart rate lasts all day.  Sometimes it wakes her up from sleep.  No exertional chest pain or exertional shortness of breath.  She was taken off lisinopril in the fall because her BP was running low but it is actually a little high today.  She is smoking about 1/2 ppd.   Labs (10/10): creatinine 0.8  Current Medications (verified): 1)  Levothyroxine Sodium 175 Mcg Tabs (Levothyroxine Sodium) .... Take 1 Tablet By Mouth Once A Day 2)  Chantix Starting Month Pak 0.5 Mg X 11 & 1 Mg X 42 Tabs (Varenicline Tartrate) .... Take As Directed 3)  Chantix Continuing Month Pak 1 Mg Tabs (Varenicline Tartrate) .... Take As Directed  Allergies (verified): 1)  ! Tramadol Hcl (Tramadol Hcl) 2)  ! Vicodin  Past History:  Past Surgical History: Last updated: 10/17/2007 Inguinal herniorrhaphy Thyroidectomy  2001 for  nodules Appendectomy neck fusion X 3  Family History: Last updated: 10/27/2009 father- alzheimers, ? hx of head and neck surgery mother- deceased unknown cancer 3 yo Family History Hypertension-father, mother Grandmother with MI in her 32s and CVA  Social History: Last updated: 10/27/2009 Married 2 healthy kids Current Smoker: 1/2 ppd Alcohol use-yes Regular exercise-no  Past Medical History: 1. 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 Osteoarthritis 3. Hypertension 4. Anemia-NOS 5. Depression 8/09: no meds, no history of suicidal ideation 6. Palpitations: holter monitor in past showed PACs/PVCs 7. Active smoker 8. appendectomy  Family History: father- alzheimers, ? hx of head and neck surgery mother- deceased unknown cancer 48 yo Family History Hypertension-father, mother Grandmother with MI in her 76s and CVA  Social History: Married 2 healthy kids Current Smoker: 1/2 ppd Alcohol use-yes Regular exercise-no  Review of Systems       All systems reviewed and negative except as per HPI.   Vital Signs:  Patient profile:   50 year old female Menstrual status:  hysterectomy Height:      62.25 inches Weight:      145 pounds BMI:     26.40 Pulse rate:   72 / minute Pulse rhythm:   regular BP sitting:   148 / 94  (left arm) Cuff size:   regular  Vitals Entered By: Oswald Hillock (October 27, 2009 4:11 PM)  Physical Exam  General:  Well developed, well nourished, in no acute  distress. Neck:  Neck supple, no JVD. No masses, thyromegaly or abnormal cervical nodes. Lungs:  Clear bilaterally to auscultation and percussion. Heart:  Non-displaced PMI, chest non-tender; regular rate and rhythm, S1, S2 without murmurs, rubs or gallops. Carotid upstroke normal, no bruit.  Pedals normal pulses. No edema, no varicosities. Abdomen:  Bowel sounds positive; abdomen soft and non-tender without masses, organomegaly, or hernias noted. No  hepatosplenomegaly. Extremities:  No clubbing or cyanosis. Neurologic:  Alert and oriented x 3. Psych:  Normal affect.   Impression & Recommendations:  Problem # 1:  PALPITATIONS (ICD-785.1) Patient has runs of racing heart rate for a variable period.  These occur about once a week and are associated with some lightheadedness.  She has a past history of PACs/PVCs causing palpitations.  I will set her up for a 3 week event monitor to look for SVT.   I will check her TSH.   Problem # 2:  SMOKING Patient wants to quit.  I will have her try Chantix.  She carries a diagnosis of depression but is not on meds and has never had suicidal ideation.  She reports that her mood is not depressed currently.   We will check lipids.   Other Orders: EKG w/ Interpretation (93000) Event (Event)  Patient Instructions: 1)  Your physician has recommended you make the following change in your medication:  2)  Start Chantix to help you stop smoking 3)  Your physician recommends that you return for a FASTING lipid profile/TSH 785.1  4)  Your physician has recommended that you wear an event monitor.  Event monitors are medical devices that record the heart's electrical activity. Doctors most often use these monitors to diagnose arrhythmias. Arrhythmias are problems with the speed or rhythm of the heartbeat. The monitor is a small, portable device. You can wear one while you do your normal daily activities. This is usually used to diagnose what is causing palpitations/syncope (passing out). 5)  Your physician recommends that you schedule a follow-up appointment in:  4 weeks (after you have completed the event  monitor) with Dr Shirlee Latch Prescriptions: CHANTIX CONTINUING MONTH PAK 1 MG TABS (VARENICLINE TARTRATE) take as directed  #1 x 1   Entered by:   Katina Dung, RN, BSN   Authorized by:   Marca Ancona, MD   Signed by:   Katina Dung, RN, BSN on 10/27/2009   Method used:   Electronically to        [facility] (retail)       [address]       [place], [place]  [id number]       Ph: [phone]       Fax: [phone]   RxID:   [id number] CHANTIX STARTING MONTH PAK 0.5 MG X 11 & 1 MG X 42 TABS (VARENICLINE TARTRATE) take as directed  #1 x 0   Entered by:   Katina Dung, RN, BSN   Authorized by:   Marca Ancona, MD   Signed by:   Katina Dung, RN, BSN on 10/27/2009   Method used:   Electronically to        [facility] (retail)       [address]       [place], [place]  [id number]       Ph: [phone]       Fax: [phone]   RxID:   [id number]

## 2010-10-25 ENCOUNTER — Encounter: Payer: Self-pay | Admitting: Internal Medicine

## 2010-10-25 ENCOUNTER — Ambulatory Visit (INDEPENDENT_AMBULATORY_CARE_PROVIDER_SITE_OTHER): Payer: 59 | Admitting: Internal Medicine

## 2010-10-25 DIAGNOSIS — E039 Hypothyroidism, unspecified: Secondary | ICD-10-CM

## 2010-10-25 DIAGNOSIS — R51 Headache: Secondary | ICD-10-CM

## 2010-10-25 DIAGNOSIS — D649 Anemia, unspecified: Secondary | ICD-10-CM

## 2010-10-25 LAB — CBC WITH DIFFERENTIAL/PLATELET
Basophils Relative: 0.3 % (ref 0.0–3.0)
Eosinophils Absolute: 0.1 10*3/uL (ref 0.0–0.7)
Eosinophils Relative: 1.2 % (ref 0.0–5.0)
Lymphocytes Relative: 27.2 % (ref 12.0–46.0)
Neutrophils Relative %: 66.8 % (ref 43.0–77.0)
RBC: 4.57 Mil/uL (ref 3.87–5.11)
WBC: 7.5 10*3/uL (ref 4.5–10.5)

## 2010-10-25 LAB — TSH: TSH: 6.74 u[IU]/mL — ABNORMAL HIGH (ref 0.35–5.50)

## 2010-10-25 LAB — SEDIMENTATION RATE: Sed Rate: 27 mm/hr — ABNORMAL HIGH (ref 0–22)

## 2010-10-25 NOTE — Progress Notes (Signed)
  Subjective:    Patient ID: Paula Jacobs, female    DOB: Jun 30, 1961, 50 y.o.   MRN: [id number]  HPI  Headache for two weeks---constant pain that waxes and wanes. Associated symptoms includes blurred vision (I can't focs on the TV"). Pain can be severe. Minimal relief with ibuprofen. No other neurologic sxs.  Describes pain as a deep ache. No previous simialr sxs. "this is the worst headache ever"    Review of Systems No other complaints in a complete ROS: except has had some neck stiffness (seeing dr. Wynetta Emery)    Objective:   Physical Exam      Well-developed well-nourished female in no acute distress. HEENT exam atraumatic, normocephalic symmetric her muscles are intact. Pupils are round and react to light. Fundi appear normal. Neck supple without lymphadenopathy or thyromegaly. Chest clear to auscultation cardiac exam S1-S2 are regular. Abdominal exam active bowel sounds, soft and nontender. Extremities no clubbing cyanosis or edema. Neurologic exam she is alert without any motor or sensory deficits. Gait is normal.   Assessment & Plan:   new-onset headache. Headache is severe she describes it as the worst of her life. I think she needs further evaluation. We'll check laboratory work including a sedimentation rate. Also needs a CT head. I'm not really concerned because her neurologic exam is nonfocal. She does have some visual disturbance with the headache making the need for a CAT scan all the more obvious.

## 2010-10-26 ENCOUNTER — Other Ambulatory Visit: Payer: Self-pay | Admitting: Internal Medicine

## 2010-10-26 DIAGNOSIS — R519 Headache, unspecified: Secondary | ICD-10-CM

## 2010-10-27 ENCOUNTER — Other Ambulatory Visit: Payer: Self-pay | Admitting: *Deleted

## 2010-10-27 DIAGNOSIS — R51 Headache: Secondary | ICD-10-CM

## 2010-10-27 NOTE — Letter (Signed)
Summary: Rheumatology-Dr. Stacey Drain  Rheumatology-Dr. Stacey Drain   Imported By: Maryln Gottron 10/19/2010 13:04:07  _____________________________________________________________________  External Attachment:    Type:   Image     Comment:   External Document

## 2010-10-28 ENCOUNTER — Other Ambulatory Visit: Payer: Self-pay | Admitting: Internal Medicine

## 2010-10-28 DIAGNOSIS — R51 Headache: Secondary | ICD-10-CM

## 2010-10-28 DIAGNOSIS — R519 Headache, unspecified: Secondary | ICD-10-CM

## 2010-10-29 ENCOUNTER — Ambulatory Visit
Admission: RE | Admit: 2010-10-29 | Discharge: 2010-10-29 | Disposition: A | Discharge status: Home / Self Care | Payer: 59 | Source: Ambulatory Visit | Attending: Internal Medicine | Admitting: Internal Medicine

## 2010-11-02 ENCOUNTER — Other Ambulatory Visit: Payer: Self-pay | Admitting: Neurosurgery

## 2010-11-02 DIAGNOSIS — M542 Cervicalgia: Secondary | ICD-10-CM

## 2010-11-02 DIAGNOSIS — R51 Headache: Secondary | ICD-10-CM

## 2010-11-03 ENCOUNTER — Telehealth: Payer: Self-pay | Admitting: Internal Medicine

## 2010-11-03 DIAGNOSIS — R635 Abnormal weight gain: Secondary | ICD-10-CM

## 2010-11-03 DIAGNOSIS — R51 Headache: Secondary | ICD-10-CM

## 2010-11-03 NOTE — Progress Notes (Signed)
LMTCB

## 2010-11-03 NOTE — Telephone Encounter (Signed)
Concerned about her weight.  She has gained 14 lbs since her OV in June 2011.  She is concerned that it due to the levothyroxine.  She does not eat a lot.

## 2010-11-03 NOTE — Telephone Encounter (Signed)
Thyroid tests normal.. Weight gain not due to thyroid status.  Can refer to nutritionist

## 2010-11-04 NOTE — Telephone Encounter (Signed)
Pt would like a ref to a nutritionist.  Referral order entered.

## 2010-11-05 ENCOUNTER — Ambulatory Visit
Admission: RE | Admit: 2010-11-05 | Discharge: 2010-11-05 | Disposition: A | Discharge status: Home / Self Care | Payer: 59 | Source: Ambulatory Visit | Attending: Neurosurgery | Admitting: Neurosurgery

## 2010-11-05 DIAGNOSIS — R51 Headache: Secondary | ICD-10-CM

## 2010-11-05 DIAGNOSIS — M542 Cervicalgia: Secondary | ICD-10-CM

## 2010-11-10 ENCOUNTER — Encounter: Payer: 59 | Attending: Internal Medicine | Admitting: *Deleted

## 2010-11-10 DIAGNOSIS — Z713 Dietary counseling and surveillance: Secondary | ICD-10-CM | POA: Insufficient documentation

## 2010-11-10 DIAGNOSIS — R635 Abnormal weight gain: Secondary | ICD-10-CM | POA: Insufficient documentation

## 2010-11-19 ENCOUNTER — Ambulatory Visit: Payer: Self-pay | Admitting: Cardiology

## 2010-12-23 ENCOUNTER — Other Ambulatory Visit: Payer: Self-pay | Admitting: Neurology

## 2010-12-23 DIAGNOSIS — R51 Headache: Secondary | ICD-10-CM

## 2010-12-28 ENCOUNTER — Other Ambulatory Visit: Payer: 59

## 2011-01-01 ENCOUNTER — Ambulatory Visit
Admission: RE | Admit: 2011-01-01 | Discharge: 2011-01-01 | Disposition: A | Discharge status: Home / Self Care | Payer: 59 | Source: Ambulatory Visit | Attending: Neurology | Admitting: Neurology

## 2011-01-01 DIAGNOSIS — R51 Headache: Secondary | ICD-10-CM

## 2011-01-04 ENCOUNTER — Encounter: Payer: Self-pay | Admitting: Cardiology

## 2011-01-11 ENCOUNTER — Telehealth: Payer: Self-pay | Admitting: Internal Medicine

## 2011-01-11 DIAGNOSIS — F32A Depression, unspecified: Secondary | ICD-10-CM

## 2011-01-11 DIAGNOSIS — F329 Major depressive disorder, single episode, unspecified: Secondary | ICD-10-CM

## 2011-01-11 NOTE — Telephone Encounter (Signed)
Pt called and is req to get a referral to a spec re: depression. Pls advise.

## 2011-01-12 NOTE — Telephone Encounter (Signed)
Refer to psychiatry. 

## 2011-01-13 ENCOUNTER — Other Ambulatory Visit: Payer: Self-pay | Admitting: Neurosurgery

## 2011-01-13 ENCOUNTER — Ambulatory Visit
Admission: RE | Admit: 2011-01-13 | Discharge: 2011-01-13 | Disposition: A | Discharge status: Home / Self Care | Payer: 59 | Source: Ambulatory Visit | Attending: Neurosurgery | Admitting: Neurosurgery

## 2011-01-13 DIAGNOSIS — M25512 Pain in left shoulder: Secondary | ICD-10-CM

## 2011-01-13 DIAGNOSIS — M542 Cervicalgia: Secondary | ICD-10-CM

## 2011-01-16 ENCOUNTER — Other Ambulatory Visit: Payer: 59

## 2011-01-20 ENCOUNTER — Other Ambulatory Visit: Payer: 59

## 2011-01-26 ENCOUNTER — Ambulatory Visit
Admission: RE | Admit: 2011-01-26 | Discharge: 2011-01-26 | Disposition: A | Discharge status: Home / Self Care | Payer: 59 | Source: Ambulatory Visit | Attending: Neurosurgery | Admitting: Neurosurgery

## 2011-01-26 DIAGNOSIS — M25512 Pain in left shoulder: Secondary | ICD-10-CM

## 2011-01-26 MED ORDER — GADOBENATE DIMEGLUMINE 529 MG/ML IV SOLN
15.0000 mL | Freq: Once | INTRAVENOUS | Status: AC | PRN
  Administered 2011-01-26: 15 mL via INTRAVENOUS

## 2011-01-27 ENCOUNTER — Telehealth: Payer: Self-pay | Admitting: Internal Medicine

## 2011-01-27 NOTE — Telephone Encounter (Signed)
Pt called and said she went to the referral that Dr Cato Mulligan recommended re: Neurology, [facility]. Pts bp was at 162/100 during her referral appt, and was told to make an ov with Dr Cato Mulligan to get on a bp med. Pt req work in appt with Dr Cato Mulligan re: bp and to get bp med.

## 2011-01-28 NOTE — Telephone Encounter (Signed)
LMTCB

## 2011-01-31 ENCOUNTER — Ambulatory Visit (INDEPENDENT_AMBULATORY_CARE_PROVIDER_SITE_OTHER): Payer: 59 | Admitting: Family Medicine

## 2011-01-31 ENCOUNTER — Encounter: Payer: Self-pay | Admitting: Family Medicine

## 2011-01-31 DIAGNOSIS — E039 Hypothyroidism, unspecified: Secondary | ICD-10-CM

## 2011-01-31 DIAGNOSIS — R631 Polydipsia: Secondary | ICD-10-CM

## 2011-01-31 DIAGNOSIS — I10 Essential (primary) hypertension: Secondary | ICD-10-CM | POA: Insufficient documentation

## 2011-01-31 MED ORDER — LISINOPRIL-HYDROCHLOROTHIAZIDE 10-12.5 MG PO TABS: 1.0000 | ORAL_TABLET | Freq: Every day | ORAL | Status: DC

## 2011-01-31 NOTE — Telephone Encounter (Signed)
appt scheduled with Dr Caryl Never 5/14 at 3:30, pt aware

## 2011-01-31 NOTE — Progress Notes (Signed)
  Subjective:    Patient ID: Paula Jacobs, female    DOB: [birth year], 50 y.o.   MRN: [id number]  HPI Patient history of hypertension. Taken off medication about a year ago reportedly secondary to low blood pressure reaction. As had recent elevations at neurologist last week 162/100. No headache. 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 some increased peripheral edema. Denies any chest pains or dizziness. No dyspnea.  Also history of hypothyroidism. Recent TSH slightly high. Compliant with therapy. Needs repeat levels. Patient also complains of increased urine frequency and thirst. Nonfasting today- ate about one half hours ago. She does not have a history of diabetes but does have positive family history in grandmother   Review of Systems  Constitutional: Negative for fever, chills and unexpected weight change.  HENT: Negative for sore throat.   Respiratory: Negative for cough, choking, shortness of breath and wheezing.   Cardiovascular: Positive for leg swelling. Negative for chest pain and palpitations.  Gastrointestinal: Negative for abdominal pain.  Genitourinary: Positive for frequency. Negative for dysuria.  Neurological: Negative for dizziness, syncope, weakness and headaches.       Objective:   Physical Exam  Constitutional: She is oriented to person, place, and time. She appears well-developed and well-nourished. No distress.  HENT:  Right Ear: External ear normal.  Left Ear: External ear normal.  Mouth/Throat: No oropharyngeal exudate.  Eyes: Pupils are equal, round, and reactive to light.  Neck: No thyromegaly present.  Cardiovascular: Normal rate, regular rhythm and normal heart sounds.   Pulmonary/Chest: Effort normal and breath sounds normal. No respiratory distress. She has no wheezes. She has no rales.  Musculoskeletal: She exhibits no edema.  Lymphadenopathy:    She has no cervical adenopathy.  Neurological: She is alert and oriented to person, place, and time.    Psychiatric: She has a normal mood and affect. Her behavior is normal.          Assessment & Plan:  #1 hypertension currently not treated. Get back on lisinopril HCTZ 10/12.5 one daily and follow up with primary physician in one month #2 hypothyroidism followup for repeat TSH within the next week #3 symptoms of polyuria and increased thirst. She will return for fasting basic metabolic panel #4 ongoing nicotine use. Encouraged to quit smoking.

## 2011-02-01 NOTE — Op Note (Signed)
NAME:  Paula Jacobs, Paula Jacobs NO.:  [id number]   MEDICAL RECORD NO.:  [id number]          PATIENT TYPE:  INP   LOCATION:  9310                          FACILITY:  [facility]   PHYSICIAN:  Norton Blizzard, MD    DATE OF BIRTH:  1961-05-13   DATE OF PROCEDURE:  07/01/2008  DATE OF DISCHARGE:                               OPERATIVE REPORT   PREOPERATIVE DIAGNOSIS:  Menometrorrhagia.   POSTOPERATIVE DIAGNOSIS:  Menometrorrhagia.   PROCEDURE:  Total vaginal hysterectomy.   SURGEON:  Norton Blizzard, MD   ASSISTANT:  Javier Glazier. Rose, MD   ANESTHESIA:  General.   IV FLUIDS:  1500 mL of lactated Ringer's.   URINE OUTPUT:  50 mL.   ESTIMATED BLOOD LOSS:  100 mL.   INDICATIONS:  The patient is a 50 year old gravida 2, para 2 with a long  history of menometrorrhagia.  The patient also has a history of  hypothyroidism, which was adequately treated.  She did have an  endometrial biopsy which was benign, but the patient continued to have  bleeding despite being treated with Depo-Provera.  She desired  definitive surgical management.  Ultrasound prior to surgery showed a 9-  week size uterus and normal adnexae, and she was counseled regarding  having a total vaginal hysterectomy.  The risks of surgery including  bleeding which may require transfusion, infection, injury to surrounding  organs, need for additional procedures including conversion to  laparotomy, injury to ureters, bladder, rectum, postoperative and  anesthesia complications were also discussed with the patient and  written informed consent was obtained.   FINDINGS:  A 9-week size anteverted uterus, normal fallopian tubes and  ovaries bilaterally.   SPECIMENS:  Uterus and cervix sent to Pathology.   COMPLICATIONS:  None.   PROCEDURE IN DETAIL:  The patient received preoperative antibiotics and  had sequential compression devices placed on the lower extremities in  the preoperative area.  She was then taken to the  operating room where  general anesthesia was administered without difficulty.  The patient was  then placed in a dorsal lithotomy position and prepped and draped in a  sterile manner.  Her bladder was catheterized for an unmeasured amount  of clear yellow urine.  Attention was turned to the patient's pelvis  where a weighted speculum was placed in the vagina and the cervix was  grasped with a toothed tenaculum.  The cervix was then injected  circumferentially with 1% lidocaine plus epinephrine solution to  maintain hemostasis and provide hydrodissection.  The cervix was then  circumferentially incised and the bladder was dissected off the  pubocervical fascia anteriorly without complication.  The anterior cul-  de-sac was then entered sharply without difficulty.  The same procedure  was performed posteriorly, and the posterior cul-de-sac was entered  without difficulty.  A long weighted speculum was inserted into the  posterior cul-de-sac and a Heaney retractor was inserted into the  anterior cul-de-sac, to improve visualization and retract the rectum and  bladder respectively safely out of the surgical field.  The cardinal and  uterosacral ligament complexes were then clamped with Heaney  clamps,  cut, and suture ligated on both sides.  Of note, all the sutures used in  this operation are 0 Vicryl unless otherwise stated.  After the  uterosacral-cardinal ligaments were tied, the suture was not cut off and  was to be used later to transfix the uterosacral ligaments to the  vaginal walls bilaterally.  The uterine arteries and the broad ligaments  were then serially clamped, cut, and suture ligated on both sides.  Excellent hemostasis was noted.  The uterus was then delivered  posteriorly and both cornua were clamped, transected, and the uterus was  delivered and sent to Pathology.  These pedicles were doubly suture  ligated using a free tie and a transfixation suture to ensure  hemostasis.   After completion of the hysterectomy, all pedicles from the  uterosacral ligament to the cornua were examined and reexamined  bilaterally, and hemostasis was confirmed.  Using a free needle, the  suture of bilateral uterosacral ligaments were transfixed to the  ipsilateral vaginal walls to provide support to the vaginal apex.  Overall, good hemostasis was noted.  The vagina was then closed with a  running interlocking suture with care given to incorporate the vaginal  mucosa and the anterior and posterior peritoneal layers. After closure  of the vagina, adequate hemostasis was reconfirmed.  The vagina was  copiously irrigated with normal saline and a Foley catheter was placed  at the end which yielded of about 50 mL of clear yellow urine.  The  patient tolerated the procedure well.  Sponge, needle, and instrument  counts were correct x 3.  She was taken to the recovery room awake,  extubated, and in stable condition.      Norton Blizzard, MD  Electronically Signed     UAD/MEDQ  D:  07/01/2008  T:  07/02/2008  Job:  [id number]

## 2011-02-01 NOTE — Letter (Signed)
November 08, 2007    Bruce H. Swords, MD  [address] [place], [place] [address]   RE:  Paula Jacobs, Paula Jacobs  MRN:  [id number]  /  DOB:  1961-06-13   Dear Smitty Cords:   Thank you for your referral of Paula Jacobs.  As you know she is a  pleasant 50 year old woman with a history of thyroid nodules and goiter  status post thyroidectomy in 2001.  She reports a history of  intermittent palpitations for several years specifically citing the  onset of the symptoms after a thyroidectomy.  She has been on Synthroid  replacement and I see recent blood work documenting normal TSH, normal  T4, and normal T3.  She describes a sense of very rapid palpitations  that can last anywhere from minutes to hours and also a fluttering  sensation in her chest.  These symptoms are very sporadic without known  precipitant.  You referred her for an echocardiogram which was done on  February 3 demonstrating normal left ventricular systolic function with  no other major valvular abnormalities and no pericardial effusion.  She  also wore 24-hour Holter monitor which showed sinus rhythm ranging from  65-139 beats per minute as well as rare premature ventricular and atrial  contractions with a rare ventricular couplet, but no sustained  arrhythmias or pauses.  I reviewed this information with the patient  today.  Her electrocardiogram shows sinus rhythm with nonspecific ST-T  wave changes and normal intervals overall.  She had no frank syncope  associated with her symptoms.  Otherwise not reporting any exertional  chest pain or breathlessness.   ALLERGIES:  No known drug allergies.   MEDICATIONS:  Include Synthroid and 125 mcg p.o. daily.   PAST MEDICAL HISTORY:  As outlined above.  Additional problems include  degenerative joint disease, previous appendectomy, previous neck fusion  surgery, and previous inguinal herniorrhaphy.   SOCIAL HISTORY:  Finds that the patient is married.  She has two  children.   She works as a Financial risk analyst.  She smokes one-half-pack of cigarettes  per day and has done so for 16 years.  She drinks a rare alcoholic  beverage.  She has two caffeinated beverages daily.   FAMILY HISTORY:  Was reviewed significant for father with Alzheimer's  disease and mother with history of cancer who died at age 35.  No  obvious premature cardiovascular disease described.   REVIEW OF SYSTEMS:  As outlined above.  The patient states that she has  a history of anemia.  She also reports episodic feelings of fatigue and  body aching and joint pain with frank fevers at that time reporting  tempers up to 102.  She states this happened sporadically, perhaps twice  a month, and is of unknown etiology at this point.   EXAMINATION:  She is comfortable in no acute distress, appears normally  nourished.  Blood pressure is 129/88, heart rate is 73 and regular.  Weight 153  pounds.  HEENT:  Conjunctiva lids normal.  Pharynx clear.  NECK:  Supple.  No elevated was pressure.  No thyroid fullness or  tenderness.  No carotid bruits.  LUNGS:  Clear without labored breathing.  Cardiac exam reveals a regular rate and rhythm.  No S3 gallop or  pericardial rub.  ABDOMEN:  Soft, nontender, normoactive bowel sounds.  EXTREMITIES:  Exhibit no frank pitting edema.  Distal pulses are 2+.  SKIN:  Warm and dry.  MUSCULOSKELETAL:  No kyphosis noted.  Neuropsychiatric the patient  is alert x3.  Affect seems appropriate.   IMPRESSION/RECOMMENDATIONS:  Paula Jacobs is a 50 year old woman with a  longstanding history of intermittent palpitations as outlined above.  Workup so far reveals occasional premature ventricular and atrial  complexes with a rare couplet but no sustained arrhythmias or pauses.  Nothing to correlate specifically with a prolonged sense of rapid  palpitations, however.  Her ejection fraction is normal by  echocardiography and recent thyroid function studies were also normal.  Her electrocardiogram  shows normal intervals, nonspecific ST changes.  I  suppose it is possible that we missed a more severe episode of  palpitations while she was wearing her monitor for 24 hours.  We  discussed the situation today and my plan is a trial of Lopressor 25 mg  p.o. b.i.d. to see if this makes a positive impact on her symptoms.  If  so, observation may be the next best step.  If not, or she manifests  further symptoms, I would  suggest an event recorder to make sure that we are not missing any other  dysrhythmias.  Regarding her episodic feelings of weakness, arthralgias,  body aches and fevers I asked her to discuss this further with you.  Recent screening blood work was overall normal.  Perhaps other  rheumatologic causes should be considered.    Sincerely,      Jonelle Sidle, MD  Electronically Signed    SGM/MedQ  DD: 11/08/2007  DT: 11/09/2007  Job #: [id number]
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
## 2011-02-01 NOTE — Discharge Summary (Signed)
NAME:  Paula Jacobs, Paula Jacobs NO.:  [id number]   MEDICAL RECORD NO.:  [id number]          PATIENT TYPE:  INP   LOCATION:  9310                          [facility]ACILITY:  [facility]   PHYSICIAN:  Norton Blizzard, MD    DATE OF BIRTH:  September 13, 1961   DATE OF ADMISSION:  07/01/2008  DATE OF DISCHARGE:  07/02/2008                               DISCHARGE SUMMARY   ADMISSION DIAGNOSIS:  Menometrorrhagia.   DISCHARGE DIAGNOSIS:  Status post uncomplicated total vaginal  hysterectomy.   PROCEDURES:  Total vaginal hysterectomy that was done on July 01, 2008.   SURGICAL PATHOLOGY:  Uterus is symmetric measuring 8.7 x 5.5 x 5.5 cm,  weight 125 grams, contains multiple intramural and submucosal  leiomyomata varying in size from 0.3 to 1.5 cm in greatest dimension.  There is a 1.5 cm pedunculated submucosal leiomyoma. Secretory-type  endometrium. Cervix with mild chronic inflammation.   BRIEF HOSPITAL COURSE:  The patient is a 50 year old gravida 2, para 2  with a long history of menometrorrhagia which was not responsive to  hormonal management who desired surgical management.  She underwent an  uncomplicated total vaginal hysterectomy on July 01, 2008.  For  further details of the surgery, please refer to the separate dictated  operative report.  By postoperative day #1, the patient was ambulating  and pain was controlled on oral medications.  She was tolerating regular  diet and passing flatus.  The patient also was noted to have minimal  bleeding on examination and had stable vital signs.  Postoperative  hemoglobin was noted to be 9.1, compared to preoperative hemoglobin of  11.6.  This was rechecked and a repeat hemoglobin came back at 9.0.  The  patient was also noted to be voiding spontaneously with no postvoid  residual on bladder scan.  She was deemed stable for discharge to home.   DISCHARGE CONDITION:  Stable.   DISCHARGE MEDICATIONS:  1. Dilaudid 2-4 mg p.o. q.4 h. p.r.n.  pain.  2. Ibuprofen 600 mg p.o. q.6 h. p.r.n. pain.  3. Colace 100 mg p.o. b.i.d. p.r.n. constipation.   DISCHARGE INSTRUCTIONS:  The patient was given the routine sheet for  postoperative instructions, advised to practice pelvic rest for the next  6 weeks, and to also avoid bathing or sitting in pools of water.  She  was told to call the clinic or to come to the MAU for any postoperative  or gynecologic concerns.  A followup appointment has been scheduled for  the patient on July 18, 2008, at 10:00 a.m.  This was communicated to  the patient.     Norton Blizzard, MD  Electronically Signed    UAD/MEDQ  D:  07/02/2008  T:  07/03/2008  Job:  [id number]
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
## 2011-02-01 NOTE — Group Therapy Note (Signed)
NAME:  Paula Jacobs, Paula Jacobs NO.:  [id number]   MEDICAL RECORD NO.:  [id number]          PATIENT TYPE:  WOC   LOCATION:  [facility]                   FACILITY:  [facility]   PHYSICIAN:  Argentina Donovan, MD        DATE OF BIRTH:  Aug 09, 1961   DATE OF SERVICE:  09/26/2007                                  CLINIC NOTE   The patient is a 50 year old African American female who had a Bartholin  cyst drained several weeks ago in the MAU for which she says has healed  well.  She has not had a Pap smear in a couple years but says she is not  emotionally ready to have that today.  She is going to make another  appointment to come back for that.  However, significant is a history of  hypothyroidism secondary to thyroidectomy in early 2000.  She has been  on thyroid 1.25 mg some time but not having a private physician has had  to go the emergency room et [facility] to get her prescription.  She made a  mistake by coming to our clinic today she states and did not know she  had to go to a medical clinic.  She has a Media planner who told her  she could go to any private internal medicine specialists or make an  appointment in medical clinic.  Meanwhile drew TSH, T3-T4 on the patient  and at least make sure she is not too much thyroid.  She says she has  occasional chest pain and I told it would be best if she gets to make  appointment in very near future to be seen.  She also has not had a  mammogram in some time.  We will schedule her for that and give her  prescription for her renewal on the thyroid, and will give her call with  the results of the laboratory tests.   On examination she has no sign of any shaking, palpitations at this  time.  HEART:  No murmur, normal sinus rhythm.  No sign of exophthalmos.   IMPRESSION:  Hypothyroidism.   The patient needs Pap smear and was scheduled for mammogram.           ______________________________  Argentina Donovan, MD     PR/MEDQ  D:   09/26/2007  T:  09/26/2007  Job:  [id number]

## 2011-02-01 NOTE — Assessment & Plan Note (Signed)
[facility]                            CARDIOLOGY OFFICE NOTE   NAME:Paula Jacobs, CHLOE FLIS                   MRN:          [id number]  DATE:01/25/2008                            DOB:          02/26/1961    PRIMARY CARE PHYSICIAN:  Valetta Mole. Swords, MD   REASON FOR VISIT:  Routine follow-up.   HISTORY OF PRESENT ILLNESS:  I saw Ms. Nuss back in February.  Her  history is detailed in my previous note.  Given her history of  palpitations we instituted a trial of Lopressor 25 mg p.o. b.i.d.  She  stated significant improvement in her palpitations with this  intervention.  She ran out of it and began to feel more palpitations, a  sense of chest discomfort and breathlessness which improved as soon as  she was able to take the medicine again.  Electrocardiogram today shows  sinus rhythm with nonspecific T-wave changes.  She had no dizziness or  syncope.   ALLERGIES:  NO KNOWN DRUG ALLERGIES.   MEDICATIONS:  1. Synthroid 125 mcg p.o. daily.  2. Metoprolol 20 mg p.o. b.i.d.   REVIEW OF SYSTEMS:  As discussed in history of present illness.  She  complains of problems with arthralgias involving her right hand,  swelling in her right arm, occasional swelling in her legs, fatigue.  Otherwise negative.   PHYSICAL EXAMINATION:  VITAL SIGNS:  Blood pressure is 132/80, heart  rate is 80, weight is 156 pounds.  GENERAL:  The patient is comfortable in no acute distress.  NECK:  Reveals no elevated jugular venous pressure, no loud bruits,  thyromegaly.  LUNGS:  Clear without labored breathing.  CARDIAC:  Exam reveals a regular rate and rhythm.  Soft systolic murmur.  No S3 gallop.  EXTREMITIES:  Exhibit no frank pitting edema today.   IMPRESSION AND RECOMMENDATIONS:  Palpitations with previously documented  premature atrial and ventricular complexes in the setting of normal left  ventricular systolic function.  I am going to anticipate continuing beta  blocker therapy as this led to an improvement in her symptoms.  If she  manifests further progressive palpitations, would consider event  recorder on beta blocker therapy.  Otherwise, I would continue follow-up  with Dr. Cato Mulligan and we can see her back as needed.     Jonelle Sidle, MD  Electronically Signed    SGM/MedQ  DD: 01/25/2008  DT: 01/25/2008  Job #: [id number]   cc:   Valetta Mole. Swords, MD

## 2011-02-01 NOTE — Group Therapy Note (Signed)
NAME:  Paula Jacobs, Paula Jacobs NO.:  [id number]   MEDICAL RECORD NO.:  [id number]          PATIENT TYPE:  WOC   LOCATION:  [facility]                   FACILITY:  [facility]   PHYSICIAN:  Johnella Moloney, MD        DATE OF BIRTH:  1960/09/21   DATE OF SERVICE:  05/15/2008                                  CLINIC NOTE   The patient is a 50 year old gravida 2, para 2 with a history of  menometrorrhagia.  The patient was initially seen on March 20, 2008 for  this complaint.  Upon evaluation, the patient was noted to have TSH of  16.57, a hemoglobin of 10.9.  The patient also underwent Pap smear  endometrial biopsy in the operating room as part of her evaluation.  The  Pap smear was negative and the endometrial biopsy showed benign  secretory endometrium and no evidence of hyperplasia or malignancy.  This procedure was done March 24, 2008.  On her followup appointment in  April 17, 2008, the patient still reported having irregular bleeding.  Her last period lasted for 2 weeks prior to that visit.  The patient was  given Depo-Provera injection during this visit to help her  menometrorrhagia.  Since that time the patient reported bleeding for 3  weeks while on Depo-Provera.  She is very frustrated with the  menometrorrhagia.  She has followed up with her primary care physician,  given her hypothyroidism and elevated TSH level.  The patient has also  followed for her hypertension and heart murmur.  She has been placed on  lisinopril 20 mg p.o. q.d. and is still taking metoprolol 25 mg p.o.  b.i.d.  The patient also has reported being very depressed she was  started on Celexa 20 mg p.o. q.d. by her primary care physician.  On  evaluation today, the patient is requesting definitive surgical  management in the form of hysterectomy.  Her last ultrasound on March 20, 2008 showed a 9-week size uterus and normal adnexa.  She does not have  any remarkable surgical history.  For further details of her  medical and  surgical history, please refer to my note on March 20, 2008.  The patient  was told that she would be an excellent candidate for a vaginal  hysterectomy.  The risks of vaginal hysterectomy including bleeding,  infection, injury to surrounding organs, need for additional procedures  including conversion to laparotomy, injury to ureters, bladder, rectum  and anesthesia complications were discussed with the patient.  The  patient is agreeable to this plan.  The surgical scheduler will call the  patient with a date and time for her vaginal hysterectomy.  Of note,  ovaries will be left in place according to the patient's wishes, and so  this was this just be booked as a total vaginal hysterectomy.  The  routine expectations for the surgery were discussed with the patient and  she was also given written information from the Care Notes system,  detailed pre-care, inpatient care, discharge care and post care after an  vaginal hysterectomy.  The patient will have a preoperative appointment  with anesthesia  prior to her surgery given her medical history.           ______________________________  Johnella Moloney, MD     UD/MEDQ  D:  05/15/2008  T:  05/15/2008  Job:  [id number]

## 2011-02-04 NOTE — Op Note (Signed)
NAME:  Paula Jacobs, Paula Jacobs NO.:  [id number]   MEDICAL RECORD NO.:  [id number]          PATIENT TYPE:  OIB   LOCATION:  2899                         FACILITY:  [facility]   PHYSICIAN:  Donalee Citrin, M.D.        DATE OF BIRTH:  [birth year]   DATE OF PROCEDURE:  01/03/2005  DATE OF DISCHARGE:                                 OPERATIVE REPORT   PREOPERATIVE DIAGNOSIS:  Left C5 radiculopathy from cervical spondylosis,  ruptured disk C4-5 left.   PROCEDURE:  Anterior cervical diskectomy and fusion, C4-5 left, using a 6 mm  patellar wedge and a 23 mm Atlantis plate with four 30 mm variable-angled  screws.   SURGEON:  Donalee Citrin, M.D.   ASSISTANT:  Kathaleen Maser. Pool, M.D.   ANESTHESIA:  General endotracheal.   HISTORY OF PRESENT ILLNESS:  The patient is a very pleasant 50 year old  female who has had a long history of neck and left arm pain, underwent C5-6  ACDF many years ago, however over the last several month got progressively  worsening neck and finally left shoulder pain radiating down to just above  her elbow.  This was consistent with a C5 nerve root distribution.  The  patient failed all forms of conservative treatment.  Preoperative imaging  showed disk degeneration with central and leftward disk bulge with free  fragment in the foramen at the C5 nerve root on the left.  Due to the size  of the patient __________ and failure of conservative treatment, the patient  was recommended an anterior cervical diskectomy and fusion.  I extensively  went over the risks and benefits of surgery with her.  She understands and  agreed to proceed forward.   The patient brought into the OR, was induced under general anesthesia, was  positioned supine and the neck in slight extension, five pounds of Holter  traction.  The right side of her neck was prepped and draped in the routine  sterile fashion.  A preoperative x-ray localized the C4-5 disk space.  A  curvilinear incision was made just  off the midline to the anterior border of  the sternocleidomastoid and the superficial layer of platysma was dissected  out and divided longitudinally.  The avascular plane between the  sternocleidomastoid and the strap muscles was developed down to the  prevertebral fascia.  The prevertebral fascia was dissected with Kitners.  Intraoperative x-ray confirmed localization at the C4-5 disk space.  Then  annulotomy was made to mark the disk space with a 15 blade scalpel.  Pituitary rongeurs were then used to remove the anterior margin of the  annulus.  Then the longus colli was reflected laterally and a self-retaining  retractor was placed, annulotomy was extended and a high-speed drill was  used to drill down the disk space down to the posterior annulus and  posterior longitudinal ligament.  At this point the operating microscope was  draped and brought into the field.  Under microscopic illumination, the  posterior longitudinal ligament was removed in piecemeal fashion with a 1 mm  Kerrison punch as well as a large osteophytic  complex coming from the C5  vertebral body was also underbitten.  This exposed the thecal sac and the  proximal C5 nerve root.  Several free fragment disks were removed from the  foramen at the level of the pedicle of the C5 nerve root and after these  were removed, the foramen was widely patent.  It was explored with a blunt  nerve hook and noted to have no further stenosis.  Then attention was taken  to the right side. The right side of the thecal sac and right proximal C5  nerve root was identified and decompressed.  Then the end plates were  scraped and prepared to receive bone graft.  A 6 mm patellar wedge was  sized, selected and inserted approximately 1 mm deep to the anterior  vertebral body line.  Then a 23 mm Atlantis plate was placed and four 30 mm  screws were drilled, tapped, all screws noted to have good purchase.  They  were subsequently tightened down  and the wound was copiously irrigated and  meticulous hemostasis was maintained.  The platysma was reapproximated with  3-0 interrupted Vicryl, the subcutaneous tissue was closed with a running 4-  0 subcuticular, Benzoin and Steri-Strips applied, and the patient went to  the recovery room in stable condition.  At the end of the case the needle  counts and sponge counts were correct.      GC/MEDQ  D:  01/03/2005  T:  01/03/2005  Job:  [id number]

## 2011-02-08 ENCOUNTER — Other Ambulatory Visit (INDEPENDENT_AMBULATORY_CARE_PROVIDER_SITE_OTHER): Payer: 59 | Admitting: Internal Medicine

## 2011-02-08 DIAGNOSIS — I10 Essential (primary) hypertension: Secondary | ICD-10-CM

## 2011-02-08 DIAGNOSIS — E039 Hypothyroidism, unspecified: Secondary | ICD-10-CM

## 2011-02-08 LAB — BASIC METABOLIC PANEL
BUN: 20 mg/dL (ref 6–23)
Calcium: 9.4 mg/dL (ref 8.4–10.5)
Creatinine, Ser: 0.9 mg/dL (ref 0.4–1.2)
GFR: 91.7 mL/min (ref >60.00)
Glucose, Bld: 107 mg/dL — ABNORMAL HIGH (ref 70–99)

## 2011-02-08 NOTE — Progress Notes (Signed)
Quick Note:  Pt informed and she is making a follow-up visit ______

## 2011-03-04 ENCOUNTER — Ambulatory Visit: Payer: 59 | Admitting: Internal Medicine

## 2011-03-04 DIAGNOSIS — Z0289 Encounter for other administrative examinations: Secondary | ICD-10-CM

## 2011-06-16 LAB — CBC
Hemoglobin: 11.3 — ABNORMAL LOW
MCHC: 33.3
RDW: 15.2

## 2011-06-16 LAB — PROTIME-INR: INR: 1

## 2011-06-20 LAB — CBC
HCT: 27.6 — ABNORMAL LOW
HCT: 27.9 — ABNORMAL LOW
HCT: 35.8 — ABNORMAL LOW
Hemoglobin: 9 — ABNORMAL LOW
Hemoglobin: 9.1 — ABNORMAL LOW
MCHC: 32.3
MCV: 86.4
MCV: 87.1
Platelets: 405 — ABNORMAL HIGH
RDW: 13.9
RDW: 14
WBC: 14.9 — ABNORMAL HIGH
WBC: 6.6

## 2011-06-20 LAB — ABO/RH: ABO/RH(D): A POS

## 2011-09-21 ENCOUNTER — Telehealth: Payer: Self-pay | Admitting: Internal Medicine

## 2011-09-21 NOTE — Telephone Encounter (Signed)
Pt is aware waiting on MD °

## 2011-09-21 NOTE — Telephone Encounter (Signed)
Pt would her tsh levels checked. She is on Synthroid. Ok to order?

## 2011-09-22 ENCOUNTER — Encounter: Payer: Self-pay | Admitting: Family

## 2011-09-22 ENCOUNTER — Ambulatory Visit (INDEPENDENT_AMBULATORY_CARE_PROVIDER_SITE_OTHER): Payer: 59 | Admitting: Family

## 2011-09-22 VITALS — BP 128/80 | HR 112 | Temp 98.3°F | Wt 167.0 lb

## 2011-09-22 DIAGNOSIS — F17200 Nicotine dependence, unspecified, uncomplicated: Secondary | ICD-10-CM

## 2011-09-22 DIAGNOSIS — F19939 Other psychoactive substance use, unspecified with withdrawal, unspecified: Secondary | ICD-10-CM

## 2011-09-22 DIAGNOSIS — R002 Palpitations: Secondary | ICD-10-CM

## 2011-09-22 DIAGNOSIS — F17203 Nicotine dependence unspecified, with withdrawal: Secondary | ICD-10-CM

## 2011-09-22 DIAGNOSIS — E039 Hypothyroidism, unspecified: Secondary | ICD-10-CM

## 2011-09-22 LAB — BASIC METABOLIC PANEL
CO2: 32 mEq/L (ref 19–32)
Chloride: 104 mEq/L (ref 96–112)
Glucose, Bld: 116 mg/dL — ABNORMAL HIGH (ref 70–99)
Potassium: 3.8 mEq/L (ref 3.5–5.1)
Sodium: 141 mEq/L (ref 135–145)

## 2011-09-22 MED ORDER — TRAZODONE HCL 50 MG PO TABS: 50.0000 mg | ORAL_TABLET | Freq: Every evening | ORAL | Status: DC | PRN

## 2011-09-22 NOTE — Telephone Encounter (Signed)
Ordered by Adline Mango

## 2011-09-22 NOTE — Progress Notes (Signed)
  Subjective:    Patient ID: Paula Jacobs, female    DOB: May 08, 1961, 51 y.o.   MRN: [id number]  HPI 51 year old white female, in with complaints of heart palpitations has been going on for about a week. Patient recently stopped smoking about a week and half ago and believes the palpitations are coming from her recent smoking cessation. Along the palpitations she found that she is more irritable, unable to sleep, and feeling more depressed. She also has a history of hypothyroidism and has not had a TSH drawn in greater than 6 months. Her biggest concern today is insomnia related to not smoking. She is requesting medication for that. She denies any feelings of helplessness, hopelessness, no thoughts of death or dying.   Review of Systems  Constitutional: Negative.   HENT: Negative.   Eyes: Negative.   Cardiovascular: Positive for palpitations.  Gastrointestinal: Negative.   Genitourinary: Negative.   Musculoskeletal: Negative.   Neurological: Negative.   Hematological: Negative.   Psychiatric/Behavioral: Negative.        Objective:   Physical Exam  Constitutional: She is oriented to person, place, and time. She appears well-developed and well-nourished.  HENT:  Right Ear: External ear normal.  Left Ear: External ear normal.  Nose: Nose normal.  Mouth/Throat: Oropharynx is clear and moist.  Eyes: Conjunctivae are normal.  Neck: Normal range of motion. Neck supple.  Cardiovascular: Normal rate, regular rhythm and normal heart sounds.   Pulmonary/Chest: Effort normal and breath sounds normal.  Abdominal: Soft. Bowel sounds are normal.  Musculoskeletal: Normal range of motion.  Neurological: She is alert and oriented to person, place, and time.  Skin: Skin is warm and dry.  Psychiatric: She has a normal mood and affect.    EKG within normal limits      Assessment & Plan:  Assessment: Palpitations, nicotine withdrawal and abuse, hypothyroidism   Plan: TSH, CMP drawn. She  requests to go back to cardiology. Discussed treatment options for patient today for nicotine withdrawal. Patient elects to simply address the insomnia. Will comment the insomnia and depression with trazodone 50 mg 1 by mouth each bedtime. Patient will recheck in 2 weeks for a complete physical exam. Recheck sooner when necessary

## 2011-09-22 NOTE — Patient Instructions (Signed)
Smoking Cessation This document explains the best ways for you to quit smoking and new treatments to help. It lists new medicines that can double or triple your chances of quitting and quitting for good. It also considers ways to avoid relapses and concerns you may have about quitting, including weight gain. NICOTINE: A POWERFUL ADDICTION If you have tried to quit smoking, you know how hard it can be. It is hard because nicotine is a very addictive drug. For some people, it can be as addictive as heroin or cocaine. Usually, people make 2 or 3 tries, or more, before finally being able to quit. Each time you try to quit, you can learn about what helps and what hurts. Quitting takes hard work and a lot of effort, but you can quit smoking. QUITTING SMOKING IS ONE OF THE MOST IMPORTANT THINGS YOU WILL EVER DO.  You will live longer, feel better, and live better.   The impact on your body of quitting smoking is felt almost immediately:   Within 20 minutes, blood pressure decreases. Pulse returns to its normal level.   After 8 hours, carbon monoxide levels in the blood return to normal. Oxygen level increases.   After 24 hours, chance of heart attack starts to decrease. Breath, hair, and body stop smelling like smoke.   After 48 hours, damaged nerve endings begin to recover. Sense of taste and smell improve.   After 72 hours, the body is virtually free of nicotine. Bronchial tubes relax and breathing becomes easier.   After 2 to 12 weeks, lungs can hold more air. Exercise becomes easier and circulation improves.   Quitting will reduce your risk of having a heart attack, stroke, cancer, or lung disease:   After 1 year, the risk of coronary heart disease is cut in half.   After 5 years, the risk of stroke falls to the same as a nonsmoker.   After 10 years, the risk of lung cancer is cut in half and the risk of other cancers decreases significantly.   After 15 years, the risk of coronary heart  disease drops, usually to the level of a nonsmoker.   If you are pregnant, quitting smoking will improve your chances of having a healthy baby.   The people you live with, especially your children, will be healthier.   You will have extra money to spend on things other than cigarettes.  FIVE KEYS TO QUITTING Studies have shown that these 5 steps will help you quit smoking and quit for good. You have the best chances of quitting if you use them together: 1. Get ready.  2. Get support and encouragement.  3. Learn new skills and behaviors.  4. Get medicine to reduce your nicotine addiction and use it correctly.  5. Be prepared for relapse or difficult situations. Be determined to continue trying to quit, even if you do not succeed at first.  1. GET READY  Set a quit date.   Change your environment.   Get rid of ALL cigarettes, ashtrays, matches, and lighters in your home, car, and place of work.   Do not let people smoke in your home.   Review your past attempts to quit. Think about what worked and what did not.   Once you quit, do not smoke. NOT EVEN A PUFF!  2. GET SUPPORT AND ENCOURAGEMENT Studies have shown that you have a better chance of being successful if you have help. You can get support in many ways.  Tell   your family, friends, and coworkers that you are going to quit and need their support. Ask them not to smoke around you.   Talk to your caregivers (doctor, dentist, nurse, pharmacist, psychologist, and/or smoking counselor).   Get individual, group, or telephone counseling and support. The more counseling you have, the better your chances are of quitting. Programs are available at local hospitals and health centers. Call your local health department for information about programs in your area.   Spiritual beliefs and practices may help some smokers quit.   Quit meters are small computer programs online or downloadable that keep track of quit statistics, such as amount  of "quit-time," cigarettes not smoked, and money saved.   Many smokers find one or more of the many self-help books available useful in helping them quit and stay off tobacco.  3. LEARN NEW SKILLS AND BEHAVIORS  Try to distract yourself from urges to smoke. Talk to someone, go for a walk, or occupy your time with a task.   When you first try to quit, change your routine. Take a different route to work. Drink tea instead of coffee. Eat breakfast in a different place.   Do something to reduce your stress. Take a hot bath, exercise, or read a book.   Plan something enjoyable to do every day. Reward yourself for not smoking.   Explore interactive web-based programs that specialize in helping you quit.  4. GET MEDICINE AND USE IT CORRECTLY Medicines can help you stop smoking and decrease the urge to smoke. Combining medicine with the above behavioral methods and support can quadruple your chances of successfully quitting smoking. The [place]. Food and Drug Administration (FDA) has approved 7 medicines to help you quit smoking. These medicines fall into 3 categories.  Nicotine replacement therapy (delivers nicotine to your body without the negative effects and risks of smoking):   Nicotine gum: Available over-the-counter.   Nicotine lozenges: Available over-the-counter.   Nicotine inhaler: Available by prescription.   Nicotine nasal spray: Available by prescription.   Nicotine skin patches (transdermal): Available by prescription and over-the-counter.   Antidepressant medicine (helps people abstain from smoking, but how this works is unknown):   Bupropion sustained-release (SR) tablets: Available by prescription.   Nicotinic receptor partial agonist (simulates the effect of nicotine in your brain):   Varenicline tartrate tablets: Available by prescription.   Ask your caregiver for advice about which medicines to use and how to use them. Carefully read the information on the package.    Everyone who is trying to quit may benefit from using a medicine. If you are pregnant or trying to become pregnant, nursing an infant, you are under age 18, or you smoke fewer than 10 cigarettes per day, talk to your caregiver before taking any nicotine replacement medicines.   You should stop using a nicotine replacement product and call your caregiver if you experience nausea, dizziness, weakness, vomiting, fast or irregular heartbeat, mouth problems with the lozenge or gum, or redness or swelling of the skin around the patch that does not go away.   Do not use any other product containing nicotine while using a nicotine replacement product.   Talk to your caregiver before using these products if you have diabetes, heart disease, asthma, stomach ulcers, you had a recent heart attack, you have high blood pressure that is not controlled with medicine, a history of irregular heartbeat, or you have been prescribed medicine to help you quit smoking.  5. BE PREPARED FOR RELAPSE OR   DIFFICULT SITUATIONS  Most relapses occur within the first 3 months after quitting. Do not be discouraged if you start smoking again. Remember, most people try several times before they finally quit.   You may have symptoms of withdrawal because your body is used to nicotine. You may crave cigarettes, be irritable, feel very hungry, cough often, get headaches, or have difficulty concentrating.   The withdrawal symptoms are only temporary. They are strongest when you first quit, but they will go away within 10 to 14 days.  Here are some difficult situations to watch for:  Alcohol. Avoid drinking alcohol. Drinking lowers your chances of successfully quitting.   Caffeine. Try to reduce the amount of caffeine you consume. It also lowers your chances of successfully quitting.   Other smokers. Being around smoking can make you want to smoke. Avoid smokers.   Weight gain. Many smokers will gain weight when they quit, usually  less than 10 pounds. Eat a healthy diet and stay active. Do not let weight gain distract you from your main goal, quitting smoking. Some medicines that help you quit smoking may also help delay weight gain. You can always lose the weight gained after you quit.   Bad mood or depression. There are a lot of ways to improve your mood other than smoking.  If you are having problems with any of these situations, talk to your caregiver. SPECIAL SITUATIONS AND CONDITIONS Studies suggest that everyone can quit smoking. Your situation or condition can give you a special reason to quit.  Pregnant women/new mothers: By quitting, you protect your baby's health and your own.   Hospitalized patients: By quitting, you reduce health problems and help healing.   Heart attack patients: By quitting, you reduce your risk of a second heart attack.   Lung, head, and neck cancer patients: By quitting, you reduce your chance of a second cancer.   Parents of children and adolescents: By quitting, you protect your children from illnesses caused by secondhand smoke.  QUESTIONS TO THINK ABOUT Think about the following questions before you try to stop smoking. You may want to talk about your answers with your caregiver.  Why do you want to quit?   If you tried to quit in the past, what helped and what did not?   What will be the most difficult situations for you after you quit? How will you plan to handle them?   Who can help you through the tough times? Your family? Friends? Caregiver?   What pleasures do you get from smoking? What ways can you still get pleasure if you quit?  Here are some questions to ask your caregiver:  How can you help me to be successful at quitting?   What medicine do you think would be best for me and how should I take it?   What should I do if I need more help?   What is smoking withdrawal like? How can I get information on withdrawal?  Quitting takes hard work and a lot of effort,  but you can quit smoking. FOR MORE INFORMATION  Smokefree.gov (http://www.smokefree.gov) provides free, accurate, evidence-based information and professional assistance to help support the immediate and long-term needs of people trying to quit smoking. Document Released: 08/30/2001 Document Revised: 05/18/2011 Document Reviewed: 06/22/2009 Exit[employer] Patient Information 2012 [employer]. 

## 2011-09-26 ENCOUNTER — Telehealth: Payer: Self-pay | Admitting: *Deleted

## 2011-09-26 NOTE — Telephone Encounter (Signed)
Pt is calling for lab results from last week.

## 2011-09-27 ENCOUNTER — Telehealth: Payer: Self-pay | Admitting: *Deleted

## 2011-09-27 NOTE — Telephone Encounter (Signed)
Labs normal.

## 2011-09-27 NOTE — Telephone Encounter (Signed)
Pt aware of lab results and note ready to pick up

## 2011-09-27 NOTE — Telephone Encounter (Signed)
Labs are normal.

## 2011-09-27 NOTE — Telephone Encounter (Signed)
Pt is calling for lab work and a note to return to work, please.

## 2011-09-28 ENCOUNTER — Telehealth: Payer: Self-pay | Admitting: Internal Medicine

## 2011-09-28 MED ORDER — LEVOTHYROXINE SODIUM 200 MCG PO TABS: 200.0000 ug | ORAL_TABLET | Freq: Every day | ORAL | Status: DC

## 2011-09-28 NOTE — Telephone Encounter (Signed)
rx sent in electronically 

## 2011-09-28 NOTE — Telephone Encounter (Signed)
Pt requesting refill on levothyroxine (SYNTHROID, LEVOTHROID) 200 MCG tablet    [facility]

## 2011-10-03 ENCOUNTER — Encounter (HOSPITAL_COMMUNITY): Payer: Self-pay

## 2011-10-03 ENCOUNTER — Emergency Department (INDEPENDENT_AMBULATORY_CARE_PROVIDER_SITE_OTHER)
Admission: EM | Admit: 2011-10-03 | Discharge: 2011-10-03 | Disposition: A | Discharge status: Home / Self Care | Payer: 59 | Source: Home / Self Care

## 2011-10-03 DIAGNOSIS — S40861A Insect bite (nonvenomous) of right upper arm, initial encounter: Secondary | ICD-10-CM

## 2011-10-03 DIAGNOSIS — IMO0001 Reserved for inherently not codable concepts without codable children: Secondary | ICD-10-CM

## 2011-10-03 DIAGNOSIS — T148XXA Other injury of unspecified body region, initial encounter: Secondary | ICD-10-CM

## 2011-10-03 HISTORY — DX: Essential (primary) hypertension: I10

## 2011-10-03 MED ORDER — TRIAMCINOLONE ACETONIDE 0.1 % EX CREA: TOPICAL_CREAM | Freq: Two times a day (BID) | CUTANEOUS | Status: DC

## 2011-10-03 NOTE — ED Provider Notes (Signed)
Medical screening examination/treatment/procedure(s) were performed by non-physician practitioner and as supervising physician I was immediately available for consultation/collaboration.  Luiz Blare MD   Luiz Blare, MD 10/03/11 [phone]

## 2011-10-03 NOTE — ED Notes (Signed)
C/o 3 insect bites to rt arm- states she noted them on Saturday and they are getting worse.  C/o slight itching.  Took one benadryl this am.

## 2011-10-03 NOTE — ED Provider Notes (Signed)
History     CSN: [id number]  Arrival date & time 10/03/11  4098   None     Chief Complaint  Patient presents with  . Rash    (Consider location/radiation/quality/duration/timing/severity/associated sxs/prior treatment) HPI Comments: Pt states she lied down on her bed 2 days ago to take a nap. When she awoke she had 3 bumps on her Rt arm. They are itchy and have increased in size. She has not gotten anymore and her husband has no bumps. She was not outdoors prior to this. She took one Benadryl this morning and has helped with her symptoms.    Past Medical History  Diagnosis Date  . HYPOTHYROIDISM 10/17/2007  . ANEMIA-NOS 04/23/2008  . DEPRESSION 04/23/2008  . Palpitations   . Osteoarthritis   . Hypertension     Past Surgical History  Procedure Date  . Appendectomy   . Inguinal hernia repair   . Thyroidectomy 2001    for nodules  . Neck fusion     x3  . Bunionectomy 10/2009 right & 02/03/10 left  . Abdominal hysterectomy     Family History  Problem Relation Age of Onset  . Cancer Mother     unknown  . Dementia Father   . Heart attack Maternal Grandmother   . Stroke Maternal Grandmother     History  Substance Use Topics  . Smoking status: Former Smoker -- 0.5 packs/day for 20 years    Quit date: 09/15/2011  . Smokeless tobacco: Not on file  . Alcohol Use: No    OB History    Grav Para Term Preterm Abortions TAB SAB Ect Mult Living                  Review of Systems  Constitutional: Negative for fever and chills.  Skin: Positive for rash.    Allergies  Hydrocodone-acetaminophen and Tramadol hcl  Home Medications   Current Outpatient Rx  Name Route Sig Dispense Refill  . LEVOTHYROXINE SODIUM 200 MCG PO TABS Oral Take 1 tablet (200 mcg total) by mouth daily. 90 tablet 0  . LISINOPRIL-HYDROCHLOROTHIAZIDE 10-12.5 MG PO TABS Oral Take 1 tablet by mouth daily. 90 tablet 3  . TRAZODONE HCL 50 MG PO TABS Oral Take 1 tablet (50 mg total) by mouth at bedtime as  needed for sleep. 30 tablet 3  . TRIAMCINOLONE ACETONIDE 0.1 % EX CREA Topical Apply topically 2 (two) times daily. 15 g 0    BP 135/87  Pulse 87  Temp(Src) 97.6 F (36.4 C) (Oral)  Resp 16  SpO2 99%  Physical Exam  Nursing note and vitals reviewed. Constitutional: She appears well-developed and well-nourished. No distress.  HENT:  Head: Normocephalic and atraumatic.  Right Ear: Tympanic membrane, external ear and ear canal normal.  Left Ear: Tympanic membrane, external ear and ear canal normal.  Nose: Nose normal.  Mouth/Throat: Uvula is midline, oropharynx is clear and moist and mucous membranes are normal. No oropharyngeal exudate, posterior oropharyngeal edema or posterior oropharyngeal erythema.  Neck: Neck supple.  Cardiovascular: Normal rate, regular rhythm and normal heart sounds.   Pulmonary/Chest: Effort normal and breath sounds normal. No respiratory distress.  Lymphadenopathy:    She has no cervical adenopathy.  Neurological: She is alert.  Skin: Skin is warm and dry.     Psychiatric: She has a normal mood and affect.    ED Course  Procedures (including critical care time)  Labs Reviewed - No data to display No results found.   1.  Insect bite of arm, right       MDM  Probable insect bites Rt UE.         Melody Comas, [place] 10/03/11 0930

## 2011-10-13 ENCOUNTER — Ambulatory Visit (INDEPENDENT_AMBULATORY_CARE_PROVIDER_SITE_OTHER): Payer: 59 | Admitting: Cardiovascular Disease

## 2011-10-13 ENCOUNTER — Encounter: Payer: Self-pay | Admitting: Cardiovascular Disease

## 2011-10-13 DIAGNOSIS — R002 Palpitations: Secondary | ICD-10-CM

## 2011-10-13 NOTE — Patient Instructions (Signed)
Your physician recommends that you schedule a follow-up appointment in: as needed basis  

## 2011-10-13 NOTE — Assessment & Plan Note (Addendum)
Her symptoms are consistent with premature ventricular contractions. Her thyroid seems to be adequately replaced-her TSH level is normal.  His quite possible that even oh her levels were normal but her bodies not getting to the exact physiologic amount of Synthroid that it requires.  Another alternative is that her potassium levels are low. Her last potassium was 3.8. She also notes that she's been having some muscle cramps. At this point I think that we should try to increase her potassium intake.  I recommended that she drink V8 juice every day for this.  We'll see her again on as-needed basis

## 2011-10-13 NOTE — Progress Notes (Signed)
    Conni Elliot Date of Birth  1960/11/26 [facility]     [place] [facility]  [address]    [address]   [address] [place], [place]  [address]    [place], [place]  [address] [phone]  Fax  [phone]  [phone]  Fax [phone]   Problem List: 1. Palpitations- 2. Tthyroidectomy for goiter in 2001. 3.  Cervical disc disease   History of Present Illness: Amethyst is a 51 year old female who presents today for further evaluation of palpitations. She's had these palpitations several years. They typically occur worse at night. He can keep her awake. They typically do not occur through the day. These episodes Last for variable length of time.  She quit smoking on 09/10/2011.  She doesn't exercise class when she would like  Current Outpatient Prescriptions on File Prior to Visit  Medication Sig Dispense Refill  . levothyroxine (SYNTHROID, LEVOTHROID) 200 MCG tablet Take 1 tablet (200 mcg total) by mouth daily.  90 tablet  0  . lisinopril-hydrochlorothiazide (PRINZIDE,ZESTORETIC) 10-12.5 MG per tablet Take 1 tablet by mouth daily.  90 tablet  3  . traZODone (DESYREL) 50 MG tablet Take 1 tablet (50 mg total) by mouth at bedtime as needed for sleep.  30 tablet  3  . triamcinolone cream (KENALOG) 0.1 % Apply topically 2 (two) times daily.  15 g  0    Allergies  Allergen Reactions  . Hydrocodone-Acetaminophen   . Tramadol Hcl     REACTION: itching, nausea    Past Medical History  Diagnosis Date  . HYPOTHYROIDISM 10/17/2007  . ANEMIA-NOS 04/23/2008  . DEPRESSION 04/23/2008  . Palpitations   . Osteoarthritis   . Hypertension     Past Surgical History  Procedure Date  . Appendectomy   . Inguinal hernia repair   . Thyroidectomy 2001    for nodules  . Neck fusion     x3  . Bunionectomy 10/2009 right & 02/03/10 left  . Abdominal hysterectomy     History  Smoking status  . Former Smoker -- 0.5 packs/day for 20 years  . Quit date: 09/15/2011    Smokeless tobacco  . Not on file   she works for [employer]  History  Alcohol Use No    Family History  Problem Relation Age of Onset  . Cancer Mother     unknown  . Dementia Father   . Heart attack Maternal Grandmother   . Stroke Maternal Grandmother     Reviw of Systems:  Reviewed in the HPI.  All other systems are negative.  Physical Exam: BP 125/80  Pulse 87  Ht 5\' 2"  (1.575 m)  Wt 175 lb 6.4 oz (79.561 kg)  BMI 32.08 kg/m2 The patient is alert and oriented x 3.  The mood and affect are normal.   Skin: warm and dry.  Color is normal.    HEENT:   Moist normocephalic/atraumatic. Carotids normal. No JVD.  Lungs: Lungs are clear.   Heart: Regular rate S1-S2. She has no arrhythmias.    Abdomen: Is normal bowel sounds. There is no hepatosplenomegaly.  Extremities:  No Clubbing cyanosis or edema   Neuro:  Normal neuro exam. Cranial nerves are intact. Motor and sensory function are intact. Her gait is normal.    ECG: And Dr. Marliss Coots office reveals normal sinus rhythm. There are no arrhythmias.  Assessment / Plan:

## 2011-10-21 ENCOUNTER — Encounter: Payer: 59 | Admitting: Internal Medicine

## 2011-11-03 ENCOUNTER — Encounter: Payer: 59 | Admitting: Internal Medicine

## 2011-11-28 ENCOUNTER — Ambulatory Visit (INDEPENDENT_AMBULATORY_CARE_PROVIDER_SITE_OTHER): Payer: 59 | Admitting: Family

## 2011-11-28 ENCOUNTER — Encounter: Payer: Self-pay | Admitting: Family

## 2011-11-28 VITALS — BP 128/84 | Temp 98.1°F | Wt 176.0 lb

## 2011-11-28 DIAGNOSIS — L738 Other specified follicular disorders: Secondary | ICD-10-CM

## 2011-11-28 DIAGNOSIS — L739 Follicular disorder, unspecified: Secondary | ICD-10-CM

## 2011-11-28 DIAGNOSIS — L299 Pruritus, unspecified: Secondary | ICD-10-CM

## 2011-11-28 MED ORDER — CLOTRIMAZOLE-BETAMETHASONE 1-0.05 % EX CREA: TOPICAL_CREAM | Freq: Two times a day (BID) | CUTANEOUS | Status: AC

## 2011-11-28 MED ORDER — DOXYCYCLINE HYCLATE 100 MG PO TABS: 100.0000 mg | ORAL_TABLET | Freq: Two times a day (BID) | ORAL | Status: AC

## 2011-11-28 NOTE — Progress Notes (Signed)
  Subjective:    Patient ID: Paula Jacobs, female    DOB: 11-Mar-1961, 51 y.o.   MRN: [id number]  HPI Comments: C/o painful, itching lesion rt inner forearm new onset 2 months ago. "Went away and then came back 2 weeks ago, and now there is a painful site on lt pinky" Stating was seen originally in urgent care and prescribed hydrocortisone cream with no relief. Denies using any new products, medications, or having pet exposure. Pain described as "tender" 9/10 with palpation.      Review of Systems  Constitutional: Negative.   Respiratory: Negative.   Cardiovascular: Negative.   Skin: Negative for color change and pallor.       Objective:   Physical Exam  Constitutional: She is oriented to person, place, and time. She appears well-developed and well-nourished. No distress.  Cardiovascular: Normal rate, regular rhythm, normal heart sounds and intact distal pulses.  Exam reveals no gallop and no friction rub.   No murmur heard. Pulmonary/Chest: Effort normal and breath sounds normal. No respiratory distress. She has no wheezes. She has no rales. She exhibits no tenderness.  Neurological: She is alert and oriented to person, place, and time.  Skin: Skin is warm and dry. She is not diaphoretic.          Assessment & Plan:  Assessment: Folliculitis, pruritis  Plan: Doxycycline,lotrisone cream, if lesion is oozing keep covered and wash hands with soap and water. OTC ibuprofen prn for discomfort. Teaching handout provided on treatment and diagnosis

## 2011-11-28 NOTE — Patient Instructions (Signed)
Folliculitis  °Folliculitis is an infection and inflammation of the hair follicles. Hair follicles become red and irritated. This inflammation is usually caused by bacteria. The bacteria thrive in warm, moist environments. This condition can be seen anywhere on the body.  °CAUSES °The most common cause of folliculitis is an infection by germs (bacteria). Fungal and viral infections can also cause the condition. Viral infections may be more common in people whose bodies are unable to fight disease well (weakened immune systems). Examples include people with: °· AIDS.  °· An organ transplant.  °· Cancer.  °People with depressed immune systems, diabetes, or obesity, have a greater risk of getting folliculitis than the general population. Certain chemicals, especially oils and tars, also can cause folliculitis. °SYMPTOMS °· An early sign of folliculitis is a small, white or yellow pus-filled, itchy lesion (pustule). These lesions appear on a red, inflamed follicle. They are usually less than 5 mm (.20 inches).  °· The most likely starting points are the scalp, thighs, legs, back and buttocks. Folliculitis is also frequently found in areas of repeated shaving.  °· When an infection of the follicle goes deeper, it becomes a boil or furuncle. A group of closely packed boils create a larger lesion (a carbuncle). These sores (lesions) tend to occur in hairy, sweaty areas of the body.  °TREATMENT  °· A doctor who specializes in skin problems (dermatologists) treats mild cases of folliculitis with antiseptic washes.  °· They also use a skin application which kills germs (topical antibiotics). Tea tree oil is a good topical antiseptic as well. It can be found at a health food store. A small percentage of individuals may develop an allergy to the tea tree oil.  °· Mild to moderate boils respond well to warm water compresses applied three times daily.  °· In some cases, oral antibiotics should be taken with the skin treatment.    °· If lesions contain large quantities of pus or fluid, your caregiver may drain them. This allows the topical antibiotics to get to the affected areas better.  °· Stubborn cases of folliculitis may respond to laser hair removal. This process uses a high intensity light beam (a laser) to destroy the follicle and reduces the scarring from folliculitis. After laser hair removal, hair will no longer grow in the laser treated area.  °Patients with long-lasting folliculitis need to find out where the infection is coming from. Germs can live in the nostrils of the patient. This can trigger an outbreak now and then. Sometimes the bacteria live in the nostrils of a family member. This person does not develop the disorder but they repeatedly re-expose others to the germ. To break the cycle of recurrence in the patient, the family member must also undergo treatment. °PREVENTION  °· Individuals who are predisposed to folliculitis should be extremely careful about personal hygiene.  °· Application of antiseptic washes may help prevent recurrences.  °· A topical antibiotic cream, mupirocin (Bactroban®), has been effective at reducing bacteria in the nostrils. It is applied inside the nose with your little finger. This is done twice daily for a week. Then it is repeated every 6 months.  °· Because follicle disorders tend to come back, patients must receive follow-up care. Your caregiver may be able to recognize a recurrence before it becomes severe.  °SEEK IMMEDIATE MEDICAL CARE IF:  °· You develop redness, swelling, or increasing pain in the area.  °· You have a fever.  °· You are not improving with treatment   or are getting worse.  °· You have any other questions or concerns.  °Document Released: 11/14/2001 Document Revised: 08/25/2011 Document Reviewed: 09/10/2008 °Exit[employer]® Patient Information ©2012 [employer]. °

## 2011-11-30 ENCOUNTER — Encounter: Payer: Self-pay | Admitting: Internal Medicine

## 2011-11-30 ENCOUNTER — Ambulatory Visit (INDEPENDENT_AMBULATORY_CARE_PROVIDER_SITE_OTHER): Payer: 59 | Admitting: Internal Medicine

## 2011-11-30 VITALS — BP 110/80 | HR 72 | Temp 98.2°F | Ht 68.0 in | Wt 178.0 lb

## 2011-11-30 DIAGNOSIS — Z0000 Encounter for general adult medical examination without abnormal findings: Secondary | ICD-10-CM

## 2011-11-30 LAB — LDL CHOLESTEROL, DIRECT: Direct LDL: 134.4 mg/dL

## 2011-11-30 LAB — CBC WITH DIFFERENTIAL/PLATELET
Basophils Relative: 0.1 % (ref 0.0–3.0)
Eosinophils Absolute: 0.3 10*3/uL (ref 0.0–0.7)
HCT: 39.2 % (ref 36.0–46.0)
Lymphs Abs: 1.4 10*3/uL (ref 0.7–4.0)
MCHC: 31.9 g/dL (ref 30.0–36.0)
MCV: 87.5 fl (ref 78.0–100.0)
Monocytes Absolute: 0.4 10*3/uL (ref 0.1–1.0)
Neutrophils Relative %: 68.7 % (ref 43.0–77.0)
Platelets: 346 10*3/uL (ref 150.0–400.0)
RBC: 4.48 Mil/uL (ref 3.87–5.11)

## 2011-11-30 LAB — HEPATIC FUNCTION PANEL
ALT: 13 U/L (ref 0–35)
AST: 15 U/L (ref 0–37)
Bilirubin, Direct: 0 mg/dL (ref 0.0–0.3)
Total Bilirubin: 0.1 mg/dL — ABNORMAL LOW (ref 0.3–1.2)

## 2011-11-30 LAB — LIPID PANEL: Total CHOL/HDL Ratio: 4

## 2011-11-30 NOTE — Progress Notes (Addendum)
Patient ID: Paula Jacobs, female   DOB: 1960/10/15, 51 y.o.   MRN: [id number]   Past Medical History  Diagnosis Date  . HYPOTHYROIDISM 10/17/2007  . ANEMIA-NOS 04/23/2008  . DEPRESSION 04/23/2008  . Palpitations   . Osteoarthritis   . Hypertension     History   Social History  . Marital Status: Married    Spouse Name: N/A    Number of Children: N/A  . Years of Education: N/A   Occupational History  . Not on file.   Social History Main Topics  . Smoking status: Former Smoker -- 0.5 packs/day for 20 years    Quit date: 09/15/2011  . Smokeless tobacco: Not on file  . Alcohol Use: No  . Drug Use: Not on file  . Sexually Active: Not on file   Other Topics Concern  . Not on file   Social History Narrative  . No narrative on file    Past Surgical History  Procedure Date  . Appendectomy   . Inguinal hernia repair   . Thyroidectomy 2001    for nodules  . Neck fusion     x3  . Bunionectomy 10/2009 right & 02/03/10 left  . Abdominal hysterectomy     Family History  Problem Relation Age of Onset  . Cancer Mother     unknown  . Dementia Father   . Heart attack Maternal Grandmother   . Stroke Maternal Grandmother     Allergies  Allergen Reactions  . Hydrocodone-Acetaminophen   . Tramadol Hcl     REACTION: itching, nausea    Current Outpatient Prescriptions on File Prior to Visit  Medication Sig Dispense Refill  . clotrimazole-betamethasone (LOTRISONE) cream Apply topically 2 (two) times daily.  30 g  0  . doxycycline (VIBRA-TABS) 100 MG tablet Take 1 tablet (100 mg total) by mouth 2 (two) times daily.  14 tablet  0  . levothyroxine (SYNTHROID, LEVOTHROID) 200 MCG tablet Take 1 tablet (200 mcg total) by mouth daily.  90 tablet  0  . lisinopril-hydrochlorothiazide (PRINZIDE,ZESTORETIC) 10-12.5 MG per tablet Take 1 tablet by mouth daily.  90 tablet  3     patient denies chest pain, shortness of breath, orthopnea. Denies lower extremity edema, abdominal  pain, change in appetite, change in bowel movements. Patient denies rashes, musculoskeletal complaints. No other specific complaints in a complete review of systems.   BP 110/80  Pulse 72  Temp(Src) 98.2 F (36.8 C) (Oral)  Ht 5\' 8"  (1.727 m)  Wt 178 lb (80.74 kg)  BMI 27.06 kg/m2  Well-developed well-nourished female in no acute distress. HEENT exam atraumatic, normocephalic, extraocular muscles are intact. Neck is supple. No jugular venous distention no thyromegaly. Chest clear to auscultation without increased work of breathing. Cardiac exam S1 and S2 are regular. Abdominal exam active bowel sounds, soft, nontender. Extremities no edema. Neurologic exam she is alert without any motor sensory deficits. Gait is normal.   A/P: Well visit: health maint UTD Discussed need for aggressive weight loss.  She states that she has not been to work since previous OV---return to work note written today

## 2011-12-19 ENCOUNTER — Emergency Department (HOSPITAL_COMMUNITY): Admission: EM | Admit: 2011-12-19 | Discharge: 2011-12-19 | Discharge status: Against Medical Advice | Payer: 59

## 2011-12-19 NOTE — ED Notes (Signed)
Pt called x2 for triage without response. Not found in waiting area.

## 2011-12-19 NOTE — ED Notes (Signed)
Pt called x1 for triage with no response, not found in waiting room. Pt called multiple times previously at 15:40, 15:49 and 15:59 by registration with no response.

## 2012-01-26 ENCOUNTER — Ambulatory Visit (INDEPENDENT_AMBULATORY_CARE_PROVIDER_SITE_OTHER): Payer: 59 | Admitting: Family

## 2012-01-26 ENCOUNTER — Encounter: Payer: Self-pay | Admitting: Family

## 2012-01-26 VITALS — BP 120/72 | Temp 98.4°F | Wt 184.0 lb

## 2012-01-26 DIAGNOSIS — R5383 Other fatigue: Secondary | ICD-10-CM

## 2012-01-26 DIAGNOSIS — E039 Hypothyroidism, unspecified: Secondary | ICD-10-CM

## 2012-01-26 DIAGNOSIS — R635 Abnormal weight gain: Secondary | ICD-10-CM

## 2012-01-26 DIAGNOSIS — I671 Cerebral aneurysm, nonruptured: Secondary | ICD-10-CM

## 2012-01-26 DIAGNOSIS — R350 Frequency of micturition: Secondary | ICD-10-CM

## 2012-01-26 LAB — CBC WITH DIFFERENTIAL/PLATELET
Basophils Relative: 0.4 % (ref 0.0–3.0)
Eosinophils Relative: 1.1 % (ref 0.0–5.0)
HCT: 39.6 % (ref 36.0–46.0)
Hemoglobin: 13.1 g/dL (ref 12.0–15.0)
Lymphs Abs: 2.3 10*3/uL (ref 0.7–4.0)
MCV: 86.1 fl (ref 78.0–100.0)
Monocytes Absolute: 0.5 10*3/uL (ref 0.1–1.0)
Neutro Abs: 4.5 10*3/uL (ref 1.4–7.7)
Platelets: 427 10*3/uL — ABNORMAL HIGH (ref 150.0–400.0)
WBC: 7.4 10*3/uL (ref 4.5–10.5)

## 2012-01-26 LAB — BASIC METABOLIC PANEL
BUN: 16 mg/dL (ref 6–23)
CO2: 27 mEq/L (ref 19–32)
Chloride: 104 mEq/L (ref 96–112)
Creatinine, Ser: 0.8 mg/dL (ref 0.4–1.2)
Glucose, Bld: 84 mg/dL (ref 70–99)
Potassium: 3.8 mEq/L (ref 3.5–5.1)

## 2012-01-26 LAB — POCT URINALYSIS DIPSTICK
Bilirubin, UA: NEGATIVE
Blood, UA: NEGATIVE
Glucose, UA: NEGATIVE
Nitrite, UA: NEGATIVE
Spec Grav, UA: 1.01
pH, UA: 5

## 2012-01-26 LAB — TSH: TSH: 0.99 u[IU]/mL (ref 0.35–5.50)

## 2012-01-26 NOTE — Patient Instructions (Signed)
Urinary Incontinence Your doctor wants you to have this information about urinary incontinence. This is the inability to keep urine in your body until you decide to release it. CAUSES  Prostate gland enlargement is a common cause of urinary incontinence. But there are many different causes for losing urinary control. They include:  Medicines.   Infections.   Prostate problems.   Surgery.   Neurological diseases.   Emotional factors.  DIAGNOSIS  Evaluating the cause of incontinence is important in choosing the best treatment. This may require:  An ultrasound exam.   Kidney and bladder X-rays.   Cystoscopy. This is an exam of the bladder using a narrow scope.  TREATMENT  For incontinent patients, normal daily hygiene and using changing pads or adult diapers regularly will prevent offensive odors and skin damage from the moisture. Changing your medicines may help control incontinence. Your caregiver may prescribe some medicines to help you regain control. Avoid caffeine. It can over-stimulate the bladder. Use the bathroom regularly. Try about every 2 to 3 hours even if you do not feel the need. Take time to empty your bladder completely. After urinating, wait a minute. Then try to urinate again. External devices used to catch urine or an indwelling urine catheter (Foley catheter) may be needed as well. Some prostate gland problems require surgery to correct. Call your caregiver for more information. Document Released: 10/13/2004 Document Revised: 08/25/2011 Document Reviewed: 10/08/2008 [facility] Patient Information 2012 [place], [place].  Depression, Adolescent and Adult Depression is a true and treatable medical condition. In general there are two kinds of depression:  Depression we all experience in some form. For example depression from the death of a loved one, financial distress or natural disasters will trigger or increase depression.   Clinical depression, on the other hand,  appears without an apparent cause or reason. This depression is a disease. Depression may be caused by chemical imbalance in the body and brain or may come as a response to a physical illness. Alcohol and other drugs can cause depression.  DIAGNOSIS  The diagnosis of depression is usually based upon symptoms and medical history. TREATMENT  Treatments for depression fall into three categories. These are:  Drug therapy. There are many medicines that treat depression. Responses may vary and sometimes trial and error is necessary to determine the best medicines and dosage for a particular patient.   Psychotherapy, also called talking treatments, helps people resolve their problems by looking at them from a different point of view and by giving people insight into their own personal makeup. Traditional psychotherapy looks at a childhood source of a problem. Other psychotherapy will look at current conflicts and move toward solving those. If the cause of depression is drug use, counseling is available to help abstain. In time the depression will usually improve. If there were underlying causes for the chemical use, they can be addressed.   ECT (electroconvulsive therapy) or shock treatment is not as commonly used today. It is a very effective treatment for severe suicidal depression. During ECT electrical impulses are applied to the head. These impulses cause a generalized seizure. It can be effective but causes a loss of memory for recent events. Sometimes this loss of memory may include the last several months.  Treat all depression or suicide threats as serious. Obtain professional help. Do not wait to see if serious depression will get better over time without help. Seek help for yourself or those around you. In the [place] the number to the [facility]  Suicide Help Lines With 24 Hour Help Are: 1-800-SUICIDE (314) 768-2095 Document Released: 09/02/2000 Document Revised: 08/25/2011 Document Reviewed:  04/23/2008 [facility] Patient Information 2012 [place], [place].

## 2012-01-26 NOTE — Progress Notes (Signed)
Subjective:    Patient ID: Paula Jacobs, female    DOB: 10/14/1960, 51 y.o.   MRN: [id number]  HPI 51 year old African American female, nonsmoker, patient of Dr. Cato Mulligan is in today with complaints of feeling sluggish, fatigue, increasing thirst, generalized just not feeling well. She stopped smoking 4 months ago. Occasionally she has symptoms of depression. Patient also has concerns of leaking urine throughout the day particularly with coughing and sneezing. She has a history of hypothyroidism and takes Synthroid 200 mics a day. She denies any double vision, blurred vision, lightheadedness, dizziness, chest pain, palpitations, shortness of breath or edema.  Patient also has a history of a brain aneurysm. She is supposed to have MRI annually to have it rechecked. Reports his medical years since she's had it done.  Review of Systems  Constitutional: Positive for fatigue.  HENT: Negative.   Cardiovascular: Negative.   Gastrointestinal: Negative.  Negative for abdominal pain.  Genitourinary: Positive for urgency and frequency. Negative for vaginal bleeding, vaginal discharge and vaginal pain.  Musculoskeletal: Negative.   Skin: Negative.   Neurological: Negative.   Hematological: Negative.   Psychiatric/Behavioral: Negative.    Past Medical History  Diagnosis Date  . HYPOTHYROIDISM 10/17/2007  . ANEMIA-NOS 04/23/2008  . DEPRESSION 04/23/2008  . Palpitations   . Osteoarthritis   . Hypertension     History   Social History  . Marital Status: Married    Spouse Name: N/A    Number of Children: N/A  . Years of Education: N/A   Occupational History  . Not on file.   Social History Main Topics  . Smoking status: Former Smoker -- 0.5 packs/day for 20 years    Quit date: 09/15/2011  . Smokeless tobacco: Not on file  . Alcohol Use: No  . Drug Use: Not on file  . Sexually Active: Not on file   Other Topics Concern  . Not on file   Social History Narrative  . No narrative on  file    Past Surgical History  Procedure Date  . Appendectomy   . Inguinal hernia repair   . Thyroidectomy 2001    for nodules  . Neck fusion     x3  . Bunionectomy 10/2009 right & 02/03/10 left  . Abdominal hysterectomy     Family History  Problem Relation Age of Onset  . Cancer Mother     unknown  . Dementia Father   . Heart attack Maternal Grandmother   . Stroke Maternal Grandmother     Allergies  Allergen Reactions  . Hydrocodone-Acetaminophen   . Tramadol Hcl     REACTION: itching, nausea    Current Outpatient Prescriptions on File Prior to Visit  Medication Sig Dispense Refill  . clotrimazole-betamethasone (LOTRISONE) cream Apply topically 2 (two) times daily.  30 g  0  . levothyroxine (SYNTHROID, LEVOTHROID) 200 MCG tablet Take 1 tablet (200 mcg total) by mouth daily.  90 tablet  0  . lisinopril-hydrochlorothiazide (PRINZIDE,ZESTORETIC) 10-12.5 MG per tablet Take 1 tablet by mouth daily.  90 tablet  3  . traZODone (DESYREL) 50 MG tablet Take 1 tablet by mouth At bedtime.        BP 120/72  Temp(Src) 98.4 F (36.9 C) (Oral)  Wt 184 lb (83.462 kg)chart    Objective:   Physical Exam  Constitutional: She is oriented to person, place, and time. She appears well-developed and well-nourished.  HENT:  Right Ear: External ear normal.  Left Ear: External ear normal.  Nose: Nose  normal.  Mouth/Throat: Oropharynx is clear and moist.  Neck: Normal range of motion. Neck supple.  Cardiovascular: Normal rate, regular rhythm and normal heart sounds.   Pulmonary/Chest: Effort normal and breath sounds normal.  Abdominal: Soft. Bowel sounds are normal.  Musculoskeletal: Normal range of motion.  Neurological: She is alert and oriented to person, place, and time.  Skin: Skin is warm and dry.  Psychiatric: She has a normal mood and affect.          Assessment & Plan:  Assessment: Fatigue, hypothyroidism, weight gain, stress incontinence, brain aneurysm  Plan: Samples  of Detrol LA 4 mg once a day given. Lab sent to include TSH, BMP, CBC will notify patient pending results. Encouraged healthy diet and exercise. MRI of the brain was ordered to evaluate aneurysm. Will bring patient back for recheck of her symptoms in 2 weeks and sooner when necessary.

## 2012-01-27 ENCOUNTER — Other Ambulatory Visit: Payer: Self-pay

## 2012-01-27 DIAGNOSIS — I671 Cerebral aneurysm, nonruptured: Secondary | ICD-10-CM

## 2012-01-30 ENCOUNTER — Telehealth: Payer: Self-pay | Admitting: Family

## 2012-01-30 NOTE — Telephone Encounter (Signed)
Pt needs blood work results °

## 2012-01-30 NOTE — Telephone Encounter (Signed)
Pt aware labs normal  

## 2012-02-01 ENCOUNTER — Other Ambulatory Visit: Payer: Self-pay

## 2012-02-01 ENCOUNTER — Telehealth: Payer: Self-pay | Admitting: Internal Medicine

## 2012-02-01 MED ORDER — DIAZEPAM 5 MG PO TABS: ORAL_TABLET | ORAL | Status: DC

## 2012-02-01 NOTE — Telephone Encounter (Signed)
Done

## 2012-02-01 NOTE — Telephone Encounter (Signed)
Pt is sch for MRI this Friday 02/03/12 at 7:30pm. Pt said that she is closterfobic and is req to get some Valium called in to [facility] on [address].

## 2012-02-03 ENCOUNTER — Ambulatory Visit
Admission: RE | Admit: 2012-02-03 | Discharge: 2012-02-03 | Disposition: A | Discharge status: Home / Self Care | Payer: 59 | Source: Ambulatory Visit | Attending: Family | Admitting: Family

## 2012-02-03 ENCOUNTER — Other Ambulatory Visit: Payer: 59

## 2012-02-03 DIAGNOSIS — I671 Cerebral aneurysm, nonruptured: Secondary | ICD-10-CM

## 2012-02-07 ENCOUNTER — Ambulatory Visit: Payer: 59 | Admitting: Family

## 2012-02-07 DIAGNOSIS — Z0289 Encounter for other administrative examinations: Secondary | ICD-10-CM

## 2012-02-08 ENCOUNTER — Ambulatory Visit (INDEPENDENT_AMBULATORY_CARE_PROVIDER_SITE_OTHER): Payer: 59 | Admitting: Family

## 2012-02-08 ENCOUNTER — Ambulatory Visit: Payer: 59 | Admitting: Family

## 2012-02-08 ENCOUNTER — Ambulatory Visit: Payer: 59 | Admitting: Family Medicine

## 2012-02-08 ENCOUNTER — Encounter: Payer: Self-pay | Admitting: Family

## 2012-02-08 VITALS — BP 118/70 | Temp 98.1°F | Wt 180.0 lb

## 2012-02-08 DIAGNOSIS — R32 Unspecified urinary incontinence: Secondary | ICD-10-CM

## 2012-02-08 DIAGNOSIS — E039 Hypothyroidism, unspecified: Secondary | ICD-10-CM

## 2012-02-08 DIAGNOSIS — E669 Obesity, unspecified: Secondary | ICD-10-CM

## 2012-02-08 MED ORDER — THYROID 120 MG PO TABS: 120.0000 mg | ORAL_TABLET | Freq: Every day | ORAL | Status: DC

## 2012-02-08 NOTE — Progress Notes (Signed)
Subjective:    Patient ID: Paula Jacobs, female    DOB: 03-08-1961, 51 y.o.   MRN: [id number]  HPI 51 year old [place] American female, nonsmoker is in for recheck of urinary incontinence. She was started on Detrol LA 4 mg a day. Reports she continues to trouble urine. The medication has been ineffective. She will like referral to urology.  Since she stopped smoking several months ago, she's had weight gain. However, she attributed to her Synthroid and will like to try a different thyroid medication. She denies any sensitivity to cold or hot, no changes in her skin hair and nails. She has gained 17lbs.    Review of Systems  Constitutional: Negative.   HENT: Negative.   Respiratory: Negative.   Cardiovascular: Negative.   Gastrointestinal: Negative.   Musculoskeletal: Negative.   Skin: Negative.   Neurological: Negative.   Hematological: Negative.   Psychiatric/Behavioral: Negative.    Past Medical History  Diagnosis Date  . HYPOTHYROIDISM 10/17/2007  . ANEMIA-NOS 04/23/2008  . DEPRESSION 04/23/2008  . Palpitations   . Osteoarthritis   . Hypertension     History   Social History  . Marital Status: Married    Spouse Name: N/A    Number of Children: N/A  . Years of Education: N/A   Occupational History  . Not on file.   Social History Main Topics  . Smoking status: Former Smoker -- 0.5 packs/day for 20 years    Quit date: 09/15/2011  . Smokeless tobacco: Not on file  . Alcohol Use: No  . Drug Use: Not on file  . Sexually Active: Not on file   Other Topics Concern  . Not on file   Social History Narrative  . No narrative on file    Past Surgical History  Procedure Date  . Appendectomy   . Inguinal hernia repair   . Thyroidectomy 2001    for nodules  . Neck fusion     x3  . Bunionectomy 10/2009 right & 02/03/10 left  . Abdominal hysterectomy     Family History  Problem Relation Age of Onset  . Cancer Mother     unknown  . Dementia Father   . Heart  attack Maternal Grandmother   . Stroke Maternal Grandmother     Allergies  Allergen Reactions  . Hydrocodone-Acetaminophen   . Tramadol Hcl     REACTION: itching, nausea    Current Outpatient Prescriptions on File Prior to Visit  Medication Sig Dispense Refill  . clotrimazole-betamethasone (LOTRISONE) cream Apply topically 2 (two) times daily.  30 g  0  . diazepam (VALIUM) 5 MG tablet Take one tablet 30 minutes prior to procedure  5 tablet  0  . levothyroxine (SYNTHROID, LEVOTHROID) 200 MCG tablet Take 1 tablet (200 mcg total) by mouth daily.  90 tablet  0  . traZODone (DESYREL) 50 MG tablet Take 1 tablet by mouth At bedtime.      Marland Kitchen lisinopril-hydrochlorothiazide (PRINZIDE,ZESTORETIC) 10-12.5 MG per tablet Take 1 tablet by mouth daily.  90 tablet  3    BP 118/70  Temp(Src) 98.1 F (36.7 C) (Oral)  Wt 180 lb (81.647 kg)chart    Objective:   Physical Exam  Constitutional: She is oriented to person, place, and time. She appears well-developed and well-nourished.  HENT:  Right Ear: External ear normal.  Left Ear: External ear normal.  Nose: Nose normal.  Mouth/Throat: Oropharynx is clear and moist.  Neck: Normal range of motion. Neck supple. No thyromegaly present.  Cardiovascular:  Normal rate, regular rhythm and normal heart sounds.   Pulmonary/Chest: Effort normal and breath sounds normal.  Abdominal: Soft.  Musculoskeletal: Normal range of motion.  Neurological: She is alert and oriented to person, place, and time.  Skin: Skin is warm and dry.  Psychiatric: She has a normal mood and affect.          Assessment & Plan:  Assessment: Hypothyroidism, urinary incontinence  Plan: We'll refer patient to urology for further management. Start Armour Thyroid 2 gr once daily. We'll recheck her thyroid levels in about 6 weeks. Call the office if symptoms worsen or persist. Followup as discussed and sooner when necessary.

## 2012-02-14 ENCOUNTER — Ambulatory Visit: Payer: 59 | Admitting: Family

## 2012-02-29 ENCOUNTER — Other Ambulatory Visit: Payer: Self-pay | Admitting: Family Medicine

## 2012-06-27 ENCOUNTER — Encounter: Payer: Self-pay | Admitting: Internal Medicine

## 2012-06-27 ENCOUNTER — Ambulatory Visit (INDEPENDENT_AMBULATORY_CARE_PROVIDER_SITE_OTHER): Payer: 59 | Admitting: Internal Medicine

## 2012-06-27 ENCOUNTER — Ambulatory Visit: Payer: 59 | Admitting: Family

## 2012-06-27 VITALS — BP 112/80 | Temp 98.0°F | Wt 196.0 lb

## 2012-06-27 DIAGNOSIS — J029 Acute pharyngitis, unspecified: Secondary | ICD-10-CM

## 2012-06-27 MED ORDER — AMOXICILLIN 500 MG PO CAPS: 500.0000 mg | ORAL_CAPSULE | Freq: Two times a day (BID) | ORAL | Status: DC

## 2012-06-27 NOTE — Assessment & Plan Note (Signed)
51 year old [place] American female with acute pharyngitis. She had fever over the weekend to 101F. Treat with amoxicillin 500 mg twice daily for 10 days.  Patient advised to call office if symptoms persist or worsen.

## 2012-06-27 NOTE — Patient Instructions (Addendum)
Gargle with warm salt water 3-4 times per day Please call our office if your symptoms do not improve or gets worse.  

## 2012-06-27 NOTE — Progress Notes (Signed)
  Subjective:    Patient ID: Paula Jacobs, female    DOB: 1960/11/09, 51 y.o.   MRN: [id number]  Sore Throat  This is a new problem. The current episode started in the past 7 days. The problem has been unchanged. The maximum temperature recorded prior to her arrival was 101 - 101.9 F. The fever has been present for less than 1 day. The pain is moderate. Pertinent negatives include no shortness of breath.      Review of Systems  Respiratory: Negative for shortness of breath.    Past Medical History  Diagnosis Date  . HYPOTHYROIDISM 10/17/2007  . ANEMIA-NOS 04/23/2008  . DEPRESSION 04/23/2008  . Palpitations   . Osteoarthritis   . Hypertension     History   Social History  . Marital Status: Married    Spouse Name: N/A    Number of Children: N/A  . Years of Education: N/A   Occupational History  . Not on file.   Social History Main Topics  . Smoking status: Former Smoker -- 0.5 packs/day for 20 years    Quit date: 09/15/2011  . Smokeless tobacco: Not on file  . Alcohol Use: No  . Drug Use: Not on file  . Sexually Active: Not on file   Other Topics Concern  . Not on file   Social History Narrative  . No narrative on file    Past Surgical History  Procedure Date  . Appendectomy   . Inguinal hernia repair   . Thyroidectomy 2001    for nodules  . Neck fusion     x3  . Bunionectomy 10/2009 right & 02/03/10 left  . Abdominal hysterectomy     Family History  Problem Relation Age of Onset  . Cancer Mother     unknown  . Dementia Father   . Heart attack Maternal Grandmother   . Stroke Maternal Grandmother     Allergies  Allergen Reactions  . Hydrocodone-Acetaminophen   . Tramadol Hcl     REACTION: itching, nausea    Current Outpatient Prescriptions on File Prior to Visit  Medication Sig Dispense Refill  . clotrimazole-betamethasone (LOTRISONE) cream Apply topically 2 (two) times daily.  30 g  0  . diazepam (VALIUM) 5 MG tablet Take one tablet 30  minutes prior to procedure  5 tablet  0  . levothyroxine (SYNTHROID, LEVOTHROID) 200 MCG tablet Take 1 tablet (200 mcg total) by mouth daily.  90 tablet  0  . lisinopril-hydrochlorothiazide (PRINZIDE,ZESTORETIC) 10-12.5 MG per tablet take 1 tablet by mouth once daily  90 tablet  3  . thyroid (ARMOUR THYROID) 120 MG tablet Take 1 tablet (120 mg total) by mouth daily.  30 tablet  4  . traZODone (DESYREL) 50 MG tablet Take 1 tablet by mouth At bedtime.        BP 112/80  Temp 98 F (36.7 C) (Oral)  Wt 196 lb (88.905 kg)       Objective:   Physical Exam  Constitutional: She appears well-developed and well-nourished.  HENT:  Head: Normocephalic and atraumatic.  Right Ear: External ear normal.  Left Ear: External ear normal.  Mouth/Throat: No oropharyngeal exudate.       Oropharyngeal erythema  Cardiovascular: Normal rate, regular rhythm and normal heart sounds.   No murmur heard. Pulmonary/Chest: Effort normal and breath sounds normal. She has no wheezes.  Skin: Skin is warm and dry.          Assessment & Plan:

## 2012-10-29 ENCOUNTER — Ambulatory Visit (INDEPENDENT_AMBULATORY_CARE_PROVIDER_SITE_OTHER): Payer: Self-pay | Admitting: Family Medicine

## 2012-10-29 ENCOUNTER — Encounter: Payer: Self-pay | Admitting: Family Medicine

## 2012-10-29 VITALS — BP 110/80 | HR 106 | Temp 98.3°F | Wt 198.0 lb

## 2012-10-29 DIAGNOSIS — T148 Other injury of unspecified body region: Secondary | ICD-10-CM

## 2012-10-29 DIAGNOSIS — W57XXXA Bitten or stung by nonvenomous insect and other nonvenomous arthropods, initial encounter: Secondary | ICD-10-CM

## 2012-10-29 NOTE — Progress Notes (Signed)
Chief Complaint  Patient presents with  . Abrasion    left arm and back; painful and itchy     HPI:  Acute visit for rash on back: -started: noticed yesterday -symptoms: itchy bumps on arms and and back, thought she saw a flea -denies: fevers, chills, pain -has tried: benadryl  ROS: See pertinent positives and negatives per HPI.  Past Medical History  Diagnosis Date  . HYPOTHYROIDISM 10/17/2007  . ANEMIA-NOS 04/23/2008  . DEPRESSION 04/23/2008  . Palpitations   . Osteoarthritis   . Hypertension     Family History  Problem Relation Age of Onset  . Cancer Mother     unknown  . Dementia Father   . Heart attack Maternal Grandmother   . Stroke Maternal Grandmother     History   Social History  . Marital Status: Married    Spouse Name: N/A    Number of Children: N/A  . Years of Education: N/A   Social History Main Topics  . Smoking status: Former Smoker -- 0.50 packs/day for 20 years    Quit date: 09/15/2011  . Smokeless tobacco: None  . Alcohol Use: No  . Drug Use: None  . Sexually Active: None   Other Topics Concern  . None   Social History Narrative  . None    Current outpatient prescriptions:clotrimazole-betamethasone (LOTRISONE) cream, Apply topically 2 (two) times daily., Disp: 30 g, Rfl: 0;  lisinopril-hydrochlorothiazide (PRINZIDE,ZESTORETIC) 10-12.5 MG per tablet, take 1 tablet by mouth once daily, Disp: 90 tablet, Rfl: 3;  thyroid (ARMOUR THYROID) 120 MG tablet, Take 1 tablet (120 mg total) by mouth daily., Disp: 30 tablet, Rfl: 4 traZODone (DESYREL) 50 MG tablet, Take 1 tablet by mouth At bedtime., Disp: , Rfl: ;  amoxicillin (AMOXIL) 500 MG capsule, Take 1 capsule (500 mg total) by mouth 2 (two) times daily., Disp: 20 capsule, Rfl: 0;  diazepam (VALIUM) 5 MG tablet, Take one tablet 30 minutes prior to procedure, Disp: 5 tablet, Rfl: 0;  levothyroxine (SYNTHROID, LEVOTHROID) 200 MCG tablet, Take 1 tablet (200 mcg total) by mouth daily., Disp: 90 tablet, Rfl:  0  EXAM:  Filed Vitals:   10/29/12 1501  BP: 110/80  Pulse: 106  Temp: 98.3 F (36.8 C)    Body mass index is 30.11 kg/(m^2).  GENERAL: vitals reviewed and listed above, alert, oriented, appears well hydrated and in no acute distress  HEENT: atraumatic, conjunttiva clear, no obvious abnormalities on inspection of external nose and ears  NECK: no obvious masses on inspection  SKIN: few scattered papules in patch on arm and on back, do not appear infected  MS: moves all extremities without noticeable abnormality  PSYCH: pleasant and cooperative, no obvious depression or anxiety  ASSESSMENT AND PLAN:  Discussed the following assessment and plan:  1. Insect bites    -lesions consistent with bug bites - likely flea - recs per below -Patient advised to return or notify a doctor immediately if symptoms worsen or persist or new concerns arise.  There are no Patient Instructions on file for this visit.   Kriste Basque R.

## 2012-10-29 NOTE — Patient Instructions (Signed)
-  take claritin or zyrtec daily  -use over the counter hydrocotisone cream for the itchy and do not itch lesions  -call exterminator to home  -follow up if worsening or not improving over next few weeks

## 2012-11-27 ENCOUNTER — Ambulatory Visit (INDEPENDENT_AMBULATORY_CARE_PROVIDER_SITE_OTHER): Payer: Self-pay | Admitting: Family

## 2012-11-27 ENCOUNTER — Encounter: Payer: Self-pay | Admitting: Family

## 2012-11-27 VITALS — BP 120/80 | Temp 98.2°F | Wt 196.0 lb

## 2012-11-27 DIAGNOSIS — R1013 Epigastric pain: Secondary | ICD-10-CM

## 2012-11-27 DIAGNOSIS — Z8719 Personal history of other diseases of the digestive system: Secondary | ICD-10-CM

## 2012-11-27 DIAGNOSIS — K219 Gastro-esophageal reflux disease without esophagitis: Secondary | ICD-10-CM

## 2012-11-27 DIAGNOSIS — N951 Menopausal and female climacteric states: Secondary | ICD-10-CM

## 2012-11-27 DIAGNOSIS — Z8711 Personal history of peptic ulcer disease: Secondary | ICD-10-CM

## 2012-11-27 MED ORDER — OMEPRAZOLE 40 MG PO CPDR: 40.0000 mg | DELAYED_RELEASE_CAPSULE | Freq: Every day | ORAL | Status: DC

## 2012-11-27 MED ORDER — VENLAFAXINE HCL ER 37.5 MG PO CP24: 37.5000 mg | ORAL_CAPSULE | Freq: Every day | ORAL | Status: DC

## 2012-11-27 NOTE — Progress Notes (Signed)
Subjective:    Patient ID: Paula Jacobs, female    DOB: 1961/05/29, 52 y.o.   MRN: [id number]  HPI 52 year old [place] American female, former smoker, patient of Dr. Cato Mulligan is in today with complaints of epigastric pain, blood in her stools, decreased appetite, heartburn and indigestion x1 week and worsening. She has not taken any medication over-the-counter. Reports seeing blood in her stools that was neither bright red nor dark black. She has a history of bleeding ulcers in her 28s. Denies any increase in stress. Attempts to avoid caffeine.  Patient has concerns of hot flashes and night sweats. She also reports unstable mood. Feels like she cries more now than she used to. She had a hysterectomy for benign reasons several years ago. Still has her ovaries. Mother had female cancer, specifics unknown.   Review of Systems  Constitutional: Negative.   HENT: Negative.   Eyes: Negative.   Respiratory: Negative.   Cardiovascular: Negative.   Gastrointestinal: Positive for abdominal pain and blood in stool. Negative for nausea, diarrhea and constipation.  Endocrine: Negative.   Genitourinary: Negative.   Musculoskeletal: Negative.   Skin: Negative.   Allergic/Immunologic: Negative.   Neurological: Negative.   Hematological: Negative.   Psychiatric/Behavioral: Positive for sleep disturbance. Negative for suicidal ideas. The patient is nervous/anxious.    Past Medical History  Diagnosis Date  . HYPOTHYROIDISM 10/17/2007  . ANEMIA-NOS 04/23/2008  . DEPRESSION 04/23/2008  . Palpitations   . Osteoarthritis   . Hypertension     History   Social History  . Marital Status: Married    Spouse Name: N/A    Number of Children: N/A  . Years of Education: N/A   Occupational History  . Not on file.   Social History Main Topics  . Smoking status: Former Smoker -- 0.50 packs/day for 20 years    Quit date: 09/15/2011  . Smokeless tobacco: Never Used  . Alcohol Use: Yes     Comment: rare   . Drug Use: No  . Sexually Active: Not on file   Other Topics Concern  . Not on file   Social History Narrative  . No narrative on file    Past Surgical History  Procedure Laterality Date  . Appendectomy    . Inguinal hernia repair    . Thyroidectomy  2001    for nodules  . Neck fusion      x3  . Bunionectomy  10/2009 right & 02/03/10 left  . Abdominal hysterectomy      Family History  Problem Relation Age of Onset  . Cancer Mother     unknown  . Dementia Father   . Heart attack Maternal Grandmother   . Stroke Maternal Grandmother     Allergies  Allergen Reactions  . Hydrocodone-Acetaminophen   . Tramadol Hcl     REACTION: itching, nausea    Current Outpatient Prescriptions on File Prior to Visit  Medication Sig Dispense Refill  . clotrimazole-betamethasone (LOTRISONE) cream Apply topically 2 (two) times daily.  30 g  0  . levothyroxine (SYNTHROID, LEVOTHROID) 200 MCG tablet Take 1 tablet (200 mcg total) by mouth daily.  90 tablet  0  . lisinopril-hydrochlorothiazide (PRINZIDE,ZESTORETIC) 10-12.5 MG per tablet take 1 tablet by mouth once daily  90 tablet  3  . thyroid (ARMOUR THYROID) 120 MG tablet Take 1 tablet (120 mg total) by mouth daily.  30 tablet  4  . traZODone (DESYREL) 50 MG tablet Take 1 tablet by mouth At bedtime.      Marland Kitchen  amoxicillin (AMOXIL) 500 MG capsule Take 1 capsule (500 mg total) by mouth 2 (two) times daily.  20 capsule  0  . diazepam (VALIUM) 5 MG tablet Take one tablet 30 minutes prior to procedure  5 tablet  0   No current facility-administered medications on file prior to visit.    BP 120/80  Temp(Src) 98.2 F (36.8 C)  Wt 196 lb (88.905 kg)  BMI 29.81 kg/m2chart    Objective:   Physical Exam  Constitutional: She is oriented to person, place, and time. She appears well-developed and well-nourished.  HENT:  Right Ear: External ear normal.  Left Ear: External ear normal.  Nose: Nose normal.  Mouth/Throat: Oropharynx is clear and  moist.  Neck: Normal range of motion. Neck supple.  Cardiovascular: Normal rate, regular rhythm and normal heart sounds.   Pulmonary/Chest: Effort normal and breath sounds normal.  Abdominal: Soft.  Epigastric pain to palpation  Neurological: She is alert and oriented to person, place, and time.  Skin: Skin is warm and dry.  Psychiatric: She has a normal mood and affect.          Assessment & Plan:  Assessment:  1. GERD 2. Epigastric pain 3. Menopausal symptoms  Plan: She has no active blood in her stool. Will treat with omeprazole 40 mg once daily. Recheck in 2 weeks. Start Effexor 37.5 mg once daily to help control menopausal symptoms and moods. Patient to call the office with any questions or concerns. GERD friendly diet.

## 2012-11-27 NOTE — Patient Instructions (Addendum)
Diet for Gastroesophageal Reflux Disease, Adult  Reflux (acid reflux) is when acid from your stomach flows up into the esophagus. When acid comes in contact with the esophagus, the acid causes irritation and soreness (inflammation) in the esophagus. When reflux happens often or so severely that it causes damage to the esophagus, it is called gastroesophageal reflux disease (GERD). Nutrition therapy can help ease the discomfort of GERD.  FOODS OR DRINKS TO AVOID OR LIMIT  · Smoking or chewing tobacco. Nicotine is one of the most potent stimulants to acid production in the gastrointestinal tract.  · Caffeinated and decaffeinated coffee and black tea.  · Regular or low-calorie carbonated beverages or energy drinks (caffeine-free carbonated beverages are allowed).    · Strong spices, such as black pepper, white pepper, red pepper, cayenne, curry powder, and chili powder.  · Peppermint or spearmint.  · Chocolate.  · High-fat foods, including meats and fried foods. Extra added fats including oils, butter, salad dressings, and nuts. Limit these to less than 8 tsp per day.  · Fruits and vegetables if they are not tolerated, such as citrus fruits or tomatoes.  · Alcohol.  · Any food that seems to aggravate your condition.  If you have questions regarding your diet, call your caregiver or a registered dietitian.  OTHER THINGS THAT MAY HELP GERD INCLUDE:   · Eating your meals slowly, in a relaxed setting.  · Eating 5 to 6 small meals per day instead of 3 large meals.  · Eliminating food for a period of time if it causes distress.  · Not lying down until 3 hours after eating a meal.  · Keeping the head of your bed raised 6 to 9 inches (15 to 23 cm) by using a foam wedge or blocks under the legs of the bed. Lying flat may make symptoms worse.  · Being physically active. Weight loss may be helpful in reducing reflux in overweight or obese adults.  · Wear loose fitting clothing  EXAMPLE MEAL PLAN  This meal plan is approximately  2,000 calories based on ChooseMyPlate.gov meal planning guidelines.  Breakfast  · ½ cup cooked oatmeal.  · 1 cup strawberries.  · 1 cup low-fat milk.  · 1 oz almonds.  Snack  · 1 cup cucumber slices.  · 6 oz yogurt (made from low-fat or fat-free milk).  Lunch  · 2 slice whole-wheat bread.  · 2½ oz sliced turkey.  · 2 tsp mayonnaise.  · 1 cup blueberries.  · 1 cup snap peas.  Snack  · 6 whole-wheat crackers.  · 1 oz string cheese.  Dinner  · ½ cup brown rice.  · 1 cup mixed veggies.  · 1 tsp olive oil.  · 3 oz grilled fish.  Document Released: 09/05/2005 Document Revised: 11/28/2011 Document Reviewed: 07/22/2011  ExitCare® Patient Information ©2013 [employer].  Gastroesophageal Reflux Disease, Adult  Gastroesophageal reflux disease (GERD) happens when acid from your stomach flows up into the esophagus. When acid comes in contact with the esophagus, the acid causes soreness (inflammation) in the esophagus. Over time, GERD may create small holes (ulcers) in the lining of the esophagus.  CAUSES   · Increased body weight. This puts pressure on the stomach, making acid rise from the stomach into the esophagus.  · Smoking. This increases acid production in the stomach.  · Drinking alcohol. This causes decreased pressure in the lower esophageal sphincter (valve or ring of muscle between the esophagus and stomach), allowing acid from the stomach   into the esophagus.  · Late evening meals and a full stomach. This increases pressure and acid production in the stomach.  · A malformed lower esophageal sphincter.  Sometimes, no cause is found.  SYMPTOMS   · Burning pain in the lower part of the mid-chest behind the breastbone and in the mid-stomach area. This may occur twice a week or more often.  · Trouble swallowing.  · Sore throat.  · Dry cough.  · Asthma-like symptoms including chest tightness, shortness of breath, or wheezing.  DIAGNOSIS   Your caregiver may be able to diagnose GERD based on your symptoms. In some cases,  X-rays and other tests may be done to check for complications or to check the condition of your stomach and esophagus.  TREATMENT   Your caregiver may recommend over-the-counter or prescription medicines to help decrease acid production. Ask your caregiver before starting or adding any new medicines.   HOME CARE INSTRUCTIONS   · Change the factors that you can control. Ask your caregiver for guidance concerning weight loss, quitting smoking, and alcohol consumption.  · Avoid foods and drinks that make your symptoms worse, such as:  · Caffeine or alcoholic drinks.  · Chocolate.  · Peppermint or mint flavorings.  · Garlic and onions.  · Spicy foods.  · Citrus fruits, such as oranges, lemons, or limes.  · Tomato-based foods such as sauce, chili, salsa, and pizza.  · Fried and fatty foods.  · Avoid lying down for the 3 hours prior to your bedtime or prior to taking a nap.  · Eat small, frequent meals instead of large meals.  · Wear loose-fitting clothing. Do not wear anything tight around your waist that causes pressure on your stomach.  · Raise the head of your bed 6 to 8 inches with wood blocks to help you sleep. Extra pillows will not help.  · Only take over-the-counter or prescription medicines for pain, discomfort, or fever as directed by your caregiver.  · Do not take aspirin, ibuprofen, or other nonsteroidal anti-inflammatory drugs (NSAIDs).  SEEK IMMEDIATE MEDICAL CARE IF:   · You have pain in your arms, neck, jaw, teeth, or back.  · Your pain increases or changes in intensity or duration.  · You develop nausea, vomiting, or sweating (diaphoresis).  · You develop shortness of breath, or you faint.  · Your vomit is green, yellow, black, or looks like coffee grounds or blood.  · Your stool is red, bloody, or black.  These symptoms could be signs of other problems, such as heart disease, gastric bleeding, or esophageal bleeding.  MAKE SURE YOU:   · Understand these instructions.  · Will watch your  condition.  · Will get help right away if you are not doing well or get worse.  Document Released: 06/15/2005 Document Revised: 11/28/2011 Document Reviewed: 03/25/2011  ExitCare® Patient Information ©2013 [employer].

## 2012-12-07 ENCOUNTER — Telehealth: Payer: Self-pay | Admitting: Internal Medicine

## 2012-12-07 MED ORDER — THYROID 120 MG PO TABS: 120.0000 mg | ORAL_TABLET | Freq: Every day | ORAL | Status: DC

## 2012-12-07 NOTE — Telephone Encounter (Signed)
Patient called stating she need a refill of her thyroid (ARMOUR THYROID) 120 MG tablet 1poqd sent to [facility] [address]. Please assist.

## 2012-12-07 NOTE — Telephone Encounter (Signed)
Sending to pharmacy 

## 2013-04-10 ENCOUNTER — Other Ambulatory Visit: Payer: Self-pay | Admitting: Family Medicine

## 2013-05-23 ENCOUNTER — Encounter: Payer: Self-pay | Admitting: Family

## 2013-05-23 ENCOUNTER — Ambulatory Visit (INDEPENDENT_AMBULATORY_CARE_PROVIDER_SITE_OTHER): Payer: BC Managed Care – PPO | Admitting: Family

## 2013-05-23 VITALS — BP 124/82 | HR 61 | Wt 203.0 lb

## 2013-05-23 DIAGNOSIS — M25569 Pain in unspecified knee: Secondary | ICD-10-CM

## 2013-05-23 DIAGNOSIS — M79644 Pain in right finger(s): Secondary | ICD-10-CM

## 2013-05-23 DIAGNOSIS — M2550 Pain in unspecified joint: Secondary | ICD-10-CM

## 2013-05-23 DIAGNOSIS — M25561 Pain in right knee: Secondary | ICD-10-CM

## 2013-05-23 DIAGNOSIS — W19XXXD Unspecified fall, subsequent encounter: Secondary | ICD-10-CM

## 2013-05-23 DIAGNOSIS — M79609 Pain in unspecified limb: Secondary | ICD-10-CM

## 2013-05-23 DIAGNOSIS — W19XXXA Unspecified fall, initial encounter: Secondary | ICD-10-CM

## 2013-05-23 DIAGNOSIS — N951 Menopausal and female climacteric states: Secondary | ICD-10-CM

## 2013-05-23 LAB — BASIC METABOLIC PANEL
BUN: 20 mg/dL (ref 6–23)
CO2: 26 mEq/L (ref 19–32)
Chloride: 105 mEq/L (ref 96–112)
Creatinine, Ser: 0.7 mg/dL (ref 0.4–1.2)
Glucose, Bld: 83 mg/dL (ref 70–99)

## 2013-05-23 LAB — CBC WITH DIFFERENTIAL/PLATELET
Basophils Relative: 0.6 % (ref 0.0–3.0)
Eosinophils Relative: 1.4 % (ref 0.0–5.0)
HCT: 35.6 % — ABNORMAL LOW (ref 36.0–46.0)
Hemoglobin: 11.6 g/dL — ABNORMAL LOW (ref 12.0–15.0)
Lymphs Abs: 2.2 10*3/uL (ref 0.7–4.0)
MCV: 84.6 fl (ref 78.0–100.0)
Monocytes Absolute: 0.4 10*3/uL (ref 0.1–1.0)
Monocytes Relative: 4.6 % (ref 3.0–12.0)
Neutro Abs: 4.9 10*3/uL (ref 1.4–7.7)
WBC: 7.7 10*3/uL (ref 4.5–10.5)

## 2013-05-23 MED ORDER — THYROID 120 MG PO TABS: 120.0000 mg | ORAL_TABLET | Freq: Every day | ORAL | Status: DC

## 2013-05-23 MED ORDER — VENLAFAXINE HCL ER 75 MG PO CP24: 75.0000 mg | ORAL_CAPSULE | Freq: Every day | ORAL | Status: DC

## 2013-05-23 MED ORDER — LISINOPRIL-HYDROCHLOROTHIAZIDE 10-12.5 MG PO TABS: ORAL_TABLET | ORAL | Status: DC

## 2013-05-23 MED ORDER — METHYLPREDNISOLONE 4 MG PO KIT: PACK | ORAL | Status: AC

## 2013-05-23 NOTE — Patient Instructions (Signed)
1. Increase Effexor to 75mg  daily.

## 2013-05-24 ENCOUNTER — Other Ambulatory Visit: Payer: Self-pay | Admitting: Family

## 2013-05-24 ENCOUNTER — Ambulatory Visit (INDEPENDENT_AMBULATORY_CARE_PROVIDER_SITE_OTHER)
Admission: RE | Admit: 2013-05-24 | Discharge: 2013-05-24 | Disposition: A | Discharge status: Home / Self Care | Payer: BC Managed Care – PPO | Source: Ambulatory Visit | Attending: Family | Admitting: Family

## 2013-05-24 DIAGNOSIS — W19XXXD Unspecified fall, subsequent encounter: Secondary | ICD-10-CM

## 2013-05-24 DIAGNOSIS — M2550 Pain in unspecified joint: Secondary | ICD-10-CM

## 2013-05-24 DIAGNOSIS — W19XXXA Unspecified fall, initial encounter: Secondary | ICD-10-CM

## 2013-05-24 LAB — RHEUMATOID FACTOR: Rhuematoid fact SerPl-aCnc: <10 IU/mL (ref <=14)

## 2013-05-24 MED ORDER — POTASSIUM CHLORIDE CRYS ER 20 MEQ PO TBCR: 20.0000 meq | EXTENDED_RELEASE_TABLET | Freq: Every day | ORAL | Status: DC

## 2013-05-24 NOTE — Progress Notes (Signed)
Subjective:    Patient ID: Paula Jacobs, female    DOB: 15-Nov-1960, 52 y.o.   MRN: [id number]  HPI 52 year old [place] American female, nonsmoker is in with complaints of a swollen right middle finger x3 months. She had a fall 3 months ago in [place]. She was seen at an urgent care clinic x-rayed her finger and said that it was normal. Today, she continues to have swelling, decreased range of motion and pain. She had been applying ice and taking ibuprofen.  Patient has concerns of multiple joint pain. She has a history of osteoarthritis and hypothyroidism. She takes ibuprofen daily that helps her symptoms. Rates the pain 5/10, better as the day progresses but worse first thing in the morning.   Review of Systems  Constitutional: Negative.   HENT: Negative.   Respiratory: Negative.   Cardiovascular: Negative.   Gastrointestinal: Negative.   Endocrine: Negative.   Genitourinary: Negative.   Musculoskeletal: Positive for myalgias, joint swelling and arthralgias.  Skin: Negative.   Allergic/Immunologic: Negative.   Neurological: Negative.   Hematological: Negative.   Psychiatric/Behavioral: Negative.    Past Medical History  Diagnosis Date  . HYPOTHYROIDISM 10/17/2007  . ANEMIA-NOS 04/23/2008  . DEPRESSION 04/23/2008  . Palpitations   . Osteoarthritis   . Hypertension     History   Social History  . Marital Status: Married    Spouse Name: N/A    Number of Children: N/A  . Years of Education: N/A   Occupational History  . Not on file.   Social History Main Topics  . Smoking status: Former Smoker -- 0.50 packs/day for 20 years    Quit date: 09/15/2011  . Smokeless tobacco: Never Used  . Alcohol Use: Yes     Comment: rare  . Drug Use: No  . Sexual Activity: Not on file   Other Topics Concern  . Not on file   Social History Narrative  . No narrative on file    Past Surgical History  Procedure Laterality Date  . Appendectomy    . Inguinal hernia repair    .  Thyroidectomy  2001    for nodules  . Neck fusion      x3  . Bunionectomy  10/2009 right & 02/03/10 left  . Abdominal hysterectomy      Family History  Problem Relation Age of Onset  . Cancer Mother     unknown  . Dementia Father   . Heart attack Maternal Grandmother   . Stroke Maternal Grandmother     Allergies  Allergen Reactions  . Hydrocodone-Acetaminophen   . Tramadol Hcl     REACTION: itching, nausea    Current Outpatient Prescriptions on File Prior to Visit  Medication Sig Dispense Refill  . amoxicillin (AMOXIL) 500 MG capsule Take 1 capsule (500 mg total) by mouth 2 (two) times daily.  20 capsule  0  . diazepam (VALIUM) 5 MG tablet Take one tablet 30 minutes prior to procedure  5 tablet  0  . levothyroxine (SYNTHROID, LEVOTHROID) 200 MCG tablet Take 1 tablet (200 mcg total) by mouth daily.  90 tablet  0  . omeprazole (PRILOSEC) 40 MG capsule Take 1 capsule (40 mg total) by mouth daily.  30 capsule  3  . traZODone (DESYREL) 50 MG tablet Take 1 tablet by mouth At bedtime.       No current facility-administered medications on file prior to visit.    BP 124/82  Pulse 61  Wt 203 lb (92.08 kg)  BMI 30.87 kg/m2chart    Objective:   Physical Exam  Constitutional: She is oriented to person, place, and time. She appears well-developed.  Neck: Normal range of motion. Neck supple. No thyromegaly present.  Cardiovascular: Normal rate, regular rhythm and normal heart sounds.   Pulmonary/Chest: Breath sounds normal.  Abdominal: Soft. Bowel sounds are normal.  Musculoskeletal: She exhibits edema. She exhibits no tenderness.   Left middle finger: Swollen, decreased range of motion. Tender at the medial joint.  Neurological: She is alert and oriented to person, place, and time. She has normal reflexes.  Skin: Skin is dry.  Psychiatric: She has a normal mood and affect.          Assessment & Plan:  Assessment: 1. Fall 2. Injury right middle finger 3.  Osteoarthritis 4. Hypothyroidism  Plan: Her labs sent to include rheumatoid arthritis factor. If labs are negative, we'll order an x-ray of her right middle finger. Continue ibuprofen as needed. Continue all other medications the same. On the office with any questions or concerns. Recheck as scheduled and as needed.

## 2013-06-26 ENCOUNTER — Other Ambulatory Visit: Payer: Self-pay | Admitting: Internal Medicine

## 2013-06-26 DIAGNOSIS — Z1231 Encounter for screening mammogram for malignant neoplasm of breast: Secondary | ICD-10-CM

## 2013-07-10 ENCOUNTER — Ambulatory Visit (HOSPITAL_COMMUNITY): Payer: BC Managed Care – PPO | Attending: Internal Medicine

## 2013-08-01 ENCOUNTER — Telehealth: Payer: Self-pay | Admitting: Internal Medicine

## 2013-08-01 ENCOUNTER — Ambulatory Visit (HOSPITAL_COMMUNITY): Payer: BC Managed Care – PPO

## 2013-08-01 NOTE — Telephone Encounter (Signed)
Pt request refill of: lisinopril-hydrochlorothiazide (PRINZIDE,ZESTORETIC) 10-12.5 MG per tablet 1/day thyroid (ARMOUR THYROID) 120 MG tablet  1 /day 90 day on both padonda had written earlier but pt did not fill then b/c she wasn't out. Now they have expired and pt would like them to be called in again, [facility] [address]

## 2013-08-02 MED ORDER — LEVOTHYROXINE SODIUM 200 MCG PO TABS: 200.0000 ug | ORAL_TABLET | Freq: Every day | ORAL | Status: DC

## 2013-08-02 MED ORDER — LISINOPRIL-HYDROCHLOROTHIAZIDE 10-12.5 MG PO TABS: ORAL_TABLET | ORAL | Status: DC

## 2013-08-19 NOTE — Telephone Encounter (Signed)
Pt did not request levothyroxine (SYNTHROID, LEVOTHROID) 200 MCG tablet but that is what was sent to her. Pt has no idea why this was changed. Pt has been taking armour thyroid . Pt has also requested 90 day of both. Only received 30 day. pls advise. [facility]

## 2013-08-19 NOTE — Telephone Encounter (Signed)
Left message for pt to call back  °

## 2013-08-20 ENCOUNTER — Other Ambulatory Visit: Payer: Self-pay

## 2013-08-20 MED ORDER — THYROID 120 MG PO TABS: 120.0000 mg | ORAL_TABLET | Freq: Every day | ORAL | Status: DC

## 2013-08-20 NOTE — Telephone Encounter (Signed)
Pt needed Armour thyroid refilled. Rx sent to pharmacy

## 2013-08-20 NOTE — Telephone Encounter (Signed)
Left message to advise pt to call back if she still has questions about meds

## 2013-09-09 ENCOUNTER — Telehealth: Payer: Self-pay | Admitting: Internal Medicine

## 2013-09-09 NOTE — Telephone Encounter (Signed)
RN/CAN tried returning call, no answer and left message.

## 2013-09-13 ENCOUNTER — Telehealth: Payer: Self-pay | Admitting: Internal Medicine

## 2013-09-13 ENCOUNTER — Encounter (HOSPITAL_COMMUNITY): Payer: Self-pay | Admitting: Emergency Medicine

## 2013-09-13 ENCOUNTER — Emergency Department (HOSPITAL_COMMUNITY)
Admission: EM | Admit: 2013-09-13 | Discharge: 2013-09-13 | Disposition: A | Discharge status: Home / Self Care | Payer: BC Managed Care – PPO | Attending: Emergency Medicine | Admitting: Emergency Medicine

## 2013-09-13 ENCOUNTER — Emergency Department (HOSPITAL_COMMUNITY): Payer: BC Managed Care – PPO

## 2013-09-13 DIAGNOSIS — Y929 Unspecified place or not applicable: Secondary | ICD-10-CM | POA: Insufficient documentation

## 2013-09-13 DIAGNOSIS — R079 Chest pain, unspecified: Secondary | ICD-10-CM | POA: Insufficient documentation

## 2013-09-13 DIAGNOSIS — R0609 Other forms of dyspnea: Secondary | ICD-10-CM | POA: Insufficient documentation

## 2013-09-13 DIAGNOSIS — R059 Cough, unspecified: Secondary | ICD-10-CM | POA: Insufficient documentation

## 2013-09-13 DIAGNOSIS — Z862 Personal history of diseases of the blood and blood-forming organs and certain disorders involving the immune mechanism: Secondary | ICD-10-CM | POA: Insufficient documentation

## 2013-09-13 DIAGNOSIS — Z91040 Latex allergy status: Secondary | ICD-10-CM | POA: Insufficient documentation

## 2013-09-13 DIAGNOSIS — I10 Essential (primary) hypertension: Secondary | ICD-10-CM | POA: Insufficient documentation

## 2013-09-13 DIAGNOSIS — F329 Major depressive disorder, single episode, unspecified: Secondary | ICD-10-CM | POA: Insufficient documentation

## 2013-09-13 DIAGNOSIS — T381X1A Poisoning by thyroid hormones and substitutes, accidental (unintentional), initial encounter: Secondary | ICD-10-CM | POA: Insufficient documentation

## 2013-09-13 DIAGNOSIS — Z87891 Personal history of nicotine dependence: Secondary | ICD-10-CM | POA: Insufficient documentation

## 2013-09-13 DIAGNOSIS — Y939 Activity, unspecified: Secondary | ICD-10-CM | POA: Insufficient documentation

## 2013-09-13 DIAGNOSIS — T38801A Poisoning by unspecified hormones and synthetic substitutes, accidental (unintentional), initial encounter: Secondary | ICD-10-CM | POA: Insufficient documentation

## 2013-09-13 DIAGNOSIS — R0602 Shortness of breath: Secondary | ICD-10-CM

## 2013-09-13 DIAGNOSIS — R05 Cough: Secondary | ICD-10-CM | POA: Insufficient documentation

## 2013-09-13 DIAGNOSIS — T50901A Poisoning by unspecified drugs, medicaments and biological substances, accidental (unintentional), initial encounter: Secondary | ICD-10-CM

## 2013-09-13 DIAGNOSIS — F3289 Other specified depressive episodes: Secondary | ICD-10-CM | POA: Insufficient documentation

## 2013-09-13 DIAGNOSIS — R0989 Other specified symptoms and signs involving the circulatory and respiratory systems: Secondary | ICD-10-CM | POA: Insufficient documentation

## 2013-09-13 DIAGNOSIS — Z79899 Other long term (current) drug therapy: Secondary | ICD-10-CM | POA: Insufficient documentation

## 2013-09-13 DIAGNOSIS — R002 Palpitations: Secondary | ICD-10-CM | POA: Insufficient documentation

## 2013-09-13 DIAGNOSIS — E039 Hypothyroidism, unspecified: Secondary | ICD-10-CM | POA: Insufficient documentation

## 2013-09-13 DIAGNOSIS — Z8739 Personal history of other diseases of the musculoskeletal system and connective tissue: Secondary | ICD-10-CM | POA: Insufficient documentation

## 2013-09-13 LAB — POCT I-STAT, CHEM 8
BUN: 15 mg/dL (ref 6–23)
Creatinine, Ser: 0.7 mg/dL (ref 0.50–1.10)
Hemoglobin: 12.9 g/dL (ref 12.0–15.0)
Potassium: 3.5 mEq/L (ref 3.5–5.1)
Sodium: 144 mEq/L (ref 135–145)

## 2013-09-13 LAB — CBC WITH DIFFERENTIAL/PLATELET
HCT: 37.1 % (ref 36.0–46.0)
Lymphocytes Relative: 25 % (ref 12–46)
Lymphs Abs: 1.6 10*3/uL (ref 0.7–4.0)
MCHC: 32.1 g/dL (ref 30.0–36.0)
Neutrophils Relative %: 68 % (ref 43–77)
Platelets: 375 10*3/uL (ref 150–400)
RBC: 4.3 MIL/uL (ref 3.87–5.11)
WBC: 6.5 10*3/uL (ref 4.0–10.5)

## 2013-09-13 LAB — POCT I-STAT TROPONIN I: Troponin i, poc: 0 ng/mL (ref 0.00–0.08)

## 2013-09-13 LAB — T3, FREE: T3, Free: 5.3 pg/mL — ABNORMAL HIGH (ref 2.3–4.2)

## 2013-09-13 LAB — TSH: TSH: 0.016 u[IU]/mL — ABNORMAL LOW (ref 0.350–4.500)

## 2013-09-13 LAB — T4, FREE: Free T4: 1.87 ng/dL — ABNORMAL HIGH (ref 0.80–1.80)

## 2013-09-13 MED ORDER — DIAZEPAM 5 MG PO TABS
5.0000 mg | ORAL_TABLET | Freq: Once | ORAL | Status: AC
  Administered 2013-09-13: 5 mg via ORAL
  Filled 2013-09-13: qty 1

## 2013-09-13 NOTE — ED Provider Notes (Signed)
Medical screening examination/treatment/procedure(s) were performed by non-physician practitioner and as supervising physician I was immediately available for consultation/collaboration.  EKG Interpretation    Date/Time:  Friday September 13 2013 11:32:33 EST Ventricular Rate:  76 PR Interval:  161 QRS Duration: 83 QT Interval:  387 QTC Calculation: 435 R Axis:   52 Text Interpretation:  Sinus rhythm Confirmed by Kysen Wetherington  DO, Marlia Schewe ([id number]) on 09/13/2013 11:58:47 AM              Layla Maw Jocob Dambach, DO 09/13/13 1541

## 2013-09-13 NOTE — Telephone Encounter (Signed)
Pt is in the ER now.

## 2013-09-13 NOTE — Telephone Encounter (Signed)
Patient Information:  Caller Name: [name]  Phone: [phone]  Patient: Paula, Jacobs  Gender: Female  DOB: Aug 23, 1961  Age: 52 Years  PCP: Birdie Sons (Adults only)  Pregnant: No  Office Follow Up:  Does the office need to follow up with this patient?: No  Instructions For The Office: N/A  RN Note:  Pt agrees with calling 911 for transport to ED for evaluation  Symptoms  Reason For Call & Symptoms: Pt states that increased HR for several days.  SHOB and Left sided breast pain.  Not constant.  Pt states that she has been taking her synthroid instead of Xanex at night for the past 2 weeks.  Pt called the office with the main concern of increased HR.  Pt states that she called office on 12/22.  Pt is reporting an unintentional OD on Synthroid and has been advised to call 911 and transport to ED for eval  Reviewed Health History In EMR: Yes  Reviewed Medications In EMR: Yes  Reviewed Allergies In EMR: Yes  Reviewed Surgeries / Procedures: Yes  Date of Onset of Symptoms: 08/30/2013 OB / GYN:  LMP: Unknown  Guideline(s) Used:  Heart Rate and Heartbeat Questions  Disposition Per Guideline:   Call EMS 911 Now  Reason For Disposition Reached:   Visible sweat on face or sweat dripping down face  Advice Given:  N/A  Patient Will Follow Care Advice:  YES

## 2013-09-13 NOTE — ED Notes (Addendum)
Poison Control, stephanie recommends drawing thyroid labs and DC home.

## 2013-09-13 NOTE — ED Provider Notes (Signed)
CSN: [id number]     Arrival date & time 09/13/13  4098 History   First MD Initiated Contact with Patient 09/13/13 1056     Chief Complaint  Patient presents with  . Drug Overdose    Pt accidentally taking 2 Levothyroxine nightly instead of 2 Xanax x 2 weeks   (Consider location/radiation/quality/duration/timing/severity/associated sxs/prior Treatment) Patient is a 52 y.o. female presenting with Overdose. The history is provided by the patient.  Drug Overdose Associated symptoms include chest pain and coughing (chronic, mild, "from lisinopril"). Pertinent negatives include no abdominal pain, chills, fever, nausea or vomiting.   Patient reports she has had shortness of breath and dyspnea on exertion for the past 2 weeks and several days of left-sided chest pain. States that this all began when she accidentally started taking 2 levothyroxin every night instead of her Xanax. Her last dose was 2 days ago. Left-sided chest pain occurs under her breast that comes and goes occurs every few minutes and lasts only seconds, states it feels like gas. The chest pain and the shortness of breath are worse with exertion patient states she has had intermittent palpitations that have been worse at night on and off for years. Her pulse rate gets as high as 130 or 140. This has been evaluated by [facility] cardiology without known diagnosis.  States he usually only happens every few months but has been constant for the past 2 weeks.  She has also had increased defecation in the past few days.  Denies leg swelling, abdominal pain, N/V.  No hx immobilization, no exogenous estrogen, denies personal or family hx blood clots.    Past Medical History  Diagnosis Date  . HYPOTHYROIDISM 10/17/2007  . ANEMIA-NOS 04/23/2008  . DEPRESSION 04/23/2008  . Palpitations   . Osteoarthritis   . Hypertension    Past Surgical History  Procedure Laterality Date  . Appendectomy    . Inguinal hernia repair    . Thyroidectomy  2001     for nodules  . Neck fusion      x3  . Bunionectomy  10/2009 right & 02/03/10 left  . Abdominal hysterectomy     Family History  Problem Relation Age of Onset  . Cancer Mother     unknown  . Dementia Father   . Heart attack Maternal Grandmother   . Stroke Maternal Grandmother    History  Substance Use Topics  . Smoking status: Former Smoker -- 0.50 packs/day for 20 years    Quit date: 09/15/2011  . Smokeless tobacco: Never Used  . Alcohol Use: Yes     Comment: rare   OB History   Grav Para Term Preterm Abortions TAB SAB Ect Mult Living                 Review of Systems  Constitutional: Negative for fever and chills.  Respiratory: Positive for cough (chronic, mild, "from lisinopril") and shortness of breath.   Cardiovascular: Positive for chest pain and palpitations. Negative for leg swelling.  Gastrointestinal: Negative for nausea, vomiting, abdominal pain and diarrhea.  Genitourinary: Negative for dysuria, urgency and frequency.    Allergies  Hydrocodone-acetaminophen; Latex; and Tramadol hcl  Home Medications   Current Outpatient Rx  Name  Route  Sig  Dispense  Refill  . ALPRAZolam (XANAX) 0.5 MG tablet   Oral   Take 0.5 mg by mouth at bedtime as needed for sleep.         . diazepam (VALIUM) 5 MG tablet  Take one tablet 30 minutes prior to procedure   5 tablet   0   . levothyroxine (SYNTHROID, LEVOTHROID) 200 MCG tablet   Oral   Take 200 mcg by mouth daily.         Marland Kitchen lisinopril-hydrochlorothiazide (PRINZIDE,ZESTORETIC) 10-12.5 MG per tablet   Oral   Take 1 tablet by mouth daily.         Marland Kitchen omeprazole (PRILOSEC) 40 MG capsule   Oral   Take 1 capsule (40 mg total) by mouth daily.   30 capsule   3   . potassium chloride SA (K-DUR,KLOR-CON) 20 MEQ tablet   Oral   Take 1 tablet (20 mEq total) by mouth daily.   30 tablet   3   . thyroid (ARMOUR THYROID) 120 MG tablet   Oral   Take 1 tablet (120 mg total) by mouth daily.   90 tablet   1    . traZODone (DESYREL) 50 MG tablet   Oral   Take 1 tablet by mouth At bedtime.          BP 131/65  Pulse 88  Temp(Src) 98.7 F (37.1 C) (Oral)  Resp 18  Wt 200 lb (90.719 kg)  SpO2 99% Physical Exam  Nursing note and vitals reviewed. Constitutional: She appears well-developed and well-nourished. No distress.  HENT:  Head: Normocephalic and atraumatic.  Neck: Neck supple.  Cardiovascular: Normal rate and regular rhythm.   Pulmonary/Chest: Effort normal and breath sounds normal. No respiratory distress. She has no wheezes. She has no rales.  Abdominal: Soft. She exhibits no distension. There is no tenderness. There is no rebound and no guarding.  Neurological: She is alert.  Skin: She is not diaphoretic.    ED Course  Procedures (including critical care time) Labs Review Labs Reviewed  CBC WITH DIFFERENTIAL - Abnormal; Notable for the following:    Hemoglobin 11.9 (*)    All other components within normal limits  POCT I-STAT, CHEM 8 - Abnormal; Notable for the following:    Glucose, Bld 129 (*)    All other components within normal limits  TSH  T4, FREE  T3, FREE  POCT I-STAT TROPONIN I   Imaging Review Dg Chest 2 View  09/13/2013   CLINICAL DATA:  Chest pain shortness of breath, reported drug overdose.  EXAM: CHEST  2 VIEW  COMPARISON:  None.  FINDINGS: The heart size and mediastinal contours are within normal limits. Both lungs are clear. The visualized skeletal structures are unremarkable. There is mild hyper inflation and flattening of the hemidiaphragms.  IMPRESSION: No active cardiopulmonary disease.  Hyperinflation.   Electronically Signed   By: Salome Holmes M.D.   On: 09/13/2013 11:49    EKG Interpretation    Date/Time:  Friday September 13 2013 11:32:33 EST Ventricular Rate:  76 PR Interval:  161 QRS Duration: 83 QT Interval:  387 QTC Calculation: 435 R Axis:   52 Text Interpretation:  Sinus rhythm Confirmed by WARD  DO, KRISTEN ([id number]) on 09/13/2013  11:58:47 AM           12:37 PM Discussed patient, workup and plan with Dr Elesa Massed.   MDM   1. SOB (shortness of breath)   2. Accidental medication overdose, initial encounter     Pt with SOB/DoE x 2 weeks since accidentally taking 2 of her levothyroxine qHS daily until two days ago.  Notes symptoms are gradually improving.  Has had interrmitent racing HR x years and has previously been evaluated by  [facility]. Likely palpitations and increased stooling related to synthroid.  Thayer Ohm, RN, spoke with poison control and related thyroid labs recommended to order for baseline, to provide PCP levels at follow up.  Pt is not having any withdrawal symptoms from not using her xanax.  Pt to follow up with PCP, cardiology.  D/C home.  Discussed results, findings, treatment, and follow up  with patient.  Pt given return precautions.  Pt verbalizes understanding and agrees with plan.       Trixie Dredge, PA-C 09/13/13 1501

## 2013-09-13 NOTE — ED Notes (Signed)
Pt states she has been accidentally taking 2 Levothyroxine tablets instead of 2 Xanax tablets nightly x 2 weeks.  Pt called her MD office and they told her to call EMS to come to the ED immediately.  Pt states she's had a "fast HR" and trouble sleeping which is why she finally double checked the bottle of meds and realized what she was doing.  Pt used to take Levothyroxine, but no longer takes it.  Pt denies pain.

## 2013-09-16 ENCOUNTER — Ambulatory Visit (INDEPENDENT_AMBULATORY_CARE_PROVIDER_SITE_OTHER): Payer: BC Managed Care – PPO | Admitting: Family

## 2013-09-16 ENCOUNTER — Encounter: Payer: Self-pay | Admitting: Family

## 2013-09-16 VITALS — BP 110/78 | HR 88 | Wt 200.0 lb

## 2013-09-16 DIAGNOSIS — F411 Generalized anxiety disorder: Secondary | ICD-10-CM

## 2013-09-16 DIAGNOSIS — R739 Hyperglycemia, unspecified: Secondary | ICD-10-CM

## 2013-09-16 DIAGNOSIS — R0602 Shortness of breath: Secondary | ICD-10-CM

## 2013-09-16 DIAGNOSIS — Z86010 Personal history of colonic polyps: Secondary | ICD-10-CM

## 2013-09-16 DIAGNOSIS — E039 Hypothyroidism, unspecified: Secondary | ICD-10-CM

## 2013-09-16 DIAGNOSIS — Z87891 Personal history of nicotine dependence: Secondary | ICD-10-CM

## 2013-09-16 DIAGNOSIS — R7309 Other abnormal glucose: Secondary | ICD-10-CM

## 2013-09-16 LAB — TSH: TSH: 0.06 u[IU]/mL — ABNORMAL LOW (ref 0.35–5.50)

## 2013-09-16 LAB — HEMOGLOBIN A1C: Hgb A1c MFr Bld: 5.5 % (ref 4.6–6.5)

## 2013-09-16 NOTE — Progress Notes (Signed)
Subjective:    Patient ID: Paula Jacobs, female    DOB: 12/25/1960, 52 y.o.   MRN: [id number]  HPI  52 year old AAF, nonsmoker, is in today as a hospital follow-up after an accidental overdose of synthroid. She reports she was taking 2 synthroid accidentally, thinking she was taking synthroid and xanax. She presented to the ED 09-13-13 with c/o SOB, accidental OD, increased anxiety related to overdose of Synthroid. She is also reporting a history of colon polyps and is due for colonoscopy. Requesting a GI referral. Patient also complains of shortness of breath and to like her lungs are not expanding well. She has a history of smoking in the past. She had a chest x-ray done at the hospital it showed hyperinflation, otherwise normal.  She also reports having elevated blood sugars at the hospital and in the past.    Review of Systems  Constitutional: Negative.   HENT: Negative.   Respiratory: Positive for shortness of breath. Negative for wheezing.   Cardiovascular: Negative.   Gastrointestinal: Negative.   Endocrine: Negative.   Genitourinary: Negative.   Musculoskeletal: Positive for arthralgias.       Joint stiffness   Skin: Negative.   Allergic/Immunologic: Negative.   Neurological: Negative.   Hematological: Negative.   Psychiatric/Behavioral: Negative.    Past Medical History  Diagnosis Date  . HYPOTHYROIDISM 10/17/2007  . ANEMIA-NOS 04/23/2008  . DEPRESSION 04/23/2008  . Palpitations   . Osteoarthritis   . Hypertension     History   Social History  . Marital Status: Married    Spouse Name: N/A    Number of Children: N/A  . Years of Education: N/A   Occupational History  . Not on file.   Social History Main Topics  . Smoking status: Former Smoker -- 0.50 packs/day for 20 years    Quit date: 09/15/2011  . Smokeless tobacco: Never Used  . Alcohol Use: Yes     Comment: rare  . Drug Use: No  . Sexual Activity: Not on file   Other Topics Concern  . Not on  file   Social History Narrative  . No narrative on file    Past Surgical History  Procedure Laterality Date  . Appendectomy    . Inguinal hernia repair    . Thyroidectomy  2001    for nodules  . Neck fusion      x3  . Bunionectomy  10/2009 right & 02/03/10 left  . Abdominal hysterectomy      Family History  Problem Relation Age of Onset  . Cancer Mother     unknown  . Dementia Father   . Heart attack Maternal Grandmother   . Stroke Maternal Grandmother     Allergies  Allergen Reactions  . Hydrocodone-Acetaminophen Itching  . Latex Itching  . Tramadol Hcl     REACTION: itching, nausea    Current Outpatient Prescriptions on File Prior to Visit  Medication Sig Dispense Refill  . ALPRAZolam (XANAX) 0.5 MG tablet Take 0.5 mg by mouth at bedtime as needed for sleep.      . diazepam (VALIUM) 5 MG tablet Take one tablet 30 minutes prior to procedure  5 tablet  0  . lisinopril-hydrochlorothiazide (PRINZIDE,ZESTORETIC) 10-12.5 MG per tablet Take 1 tablet by mouth daily.      Marland Kitchen omeprazole (PRILOSEC) 40 MG capsule Take 1 capsule (40 mg total) by mouth daily.  30 capsule  3  . potassium chloride SA (K-DUR,KLOR-CON) 20 MEQ tablet Take 1  tablet (20 mEq total) by mouth daily.  30 tablet  3  . thyroid (ARMOUR THYROID) 120 MG tablet Take 1 tablet (120 mg total) by mouth daily.  90 tablet  1  . traZODone (DESYREL) 50 MG tablet Take 1 tablet by mouth At bedtime.       No current facility-administered medications on file prior to visit.    BP 110/78  Pulse 88  Wt 200 lb (90.719 kg)chart    Objective:   Physical Exam  Constitutional: She is oriented to person, place, and time. She appears well-developed and well-nourished.  HENT:  Right Ear: External ear normal.  Left Ear: External ear normal.  Nose: Nose normal.  Mouth/Throat: Oropharynx is clear and moist.  Neck: Normal range of motion. Neck supple.  Cardiovascular: Normal rate, regular rhythm and normal heart sounds.     Pulmonary/Chest: Effort normal and breath sounds normal.  Abdominal: Soft. Bowel sounds are normal. She exhibits no distension. There is no tenderness. There is no rebound.  Musculoskeletal: Normal range of motion. She exhibits no edema and no tenderness.  Neurological: She is alert and oriented to person, place, and time. She has normal reflexes. She displays normal reflexes. No cranial nerve deficit. Coordination normal.  Skin: Skin is warm and dry.  Psychiatric: She has a normal mood and affect.          Assessment & Plan:  Assessment: 1. Hypothyroidism 2. Hypertension 3. Accidental Overdose  4. Colon Polyps  5. Hyperglycemia 6. Shortness of breath related to hyperinflation  Plan: Refer to GI. Recheck TSH in 4 weeks.  Sample of Symbicort 160/12.5 provided today advised patient to let me now how things are in approximately 2 weeks. Continue exercising. Recheck as scheduled, and as needed.

## 2013-09-16 NOTE — Patient Instructions (Signed)

## 2013-09-16 NOTE — Progress Notes (Signed)
Pre visit review using our clinic review tool, if applicable. No additional management support is needed unless otherwise documented below in the visit note. 

## 2013-09-18 ENCOUNTER — Emergency Department (HOSPITAL_COMMUNITY)
Admission: EM | Admit: 2013-09-18 | Discharge: 2013-09-19 | Disposition: A | Discharge status: Home / Self Care | Payer: BC Managed Care – PPO | Attending: Emergency Medicine | Admitting: Emergency Medicine

## 2013-09-18 ENCOUNTER — Telehealth: Payer: Self-pay | Admitting: Internal Medicine

## 2013-09-18 ENCOUNTER — Encounter: Payer: Self-pay | Admitting: Internal Medicine

## 2013-09-18 ENCOUNTER — Emergency Department (HOSPITAL_COMMUNITY): Payer: BC Managed Care – PPO

## 2013-09-18 ENCOUNTER — Encounter (HOSPITAL_COMMUNITY): Payer: Self-pay | Admitting: Emergency Medicine

## 2013-09-18 DIAGNOSIS — I10 Essential (primary) hypertension: Secondary | ICD-10-CM | POA: Insufficient documentation

## 2013-09-18 DIAGNOSIS — R296 Repeated falls: Secondary | ICD-10-CM | POA: Insufficient documentation

## 2013-09-18 DIAGNOSIS — Y939 Activity, unspecified: Secondary | ICD-10-CM | POA: Insufficient documentation

## 2013-09-18 DIAGNOSIS — S86912A Strain of unspecified muscle(s) and tendon(s) at lower leg level, left leg, initial encounter: Secondary | ICD-10-CM

## 2013-09-18 DIAGNOSIS — Z862 Personal history of diseases of the blood and blood-forming organs and certain disorders involving the immune mechanism: Secondary | ICD-10-CM | POA: Insufficient documentation

## 2013-09-18 DIAGNOSIS — F3289 Other specified depressive episodes: Secondary | ICD-10-CM | POA: Insufficient documentation

## 2013-09-18 DIAGNOSIS — F329 Major depressive disorder, single episode, unspecified: Secondary | ICD-10-CM | POA: Insufficient documentation

## 2013-09-18 DIAGNOSIS — M1990 Unspecified osteoarthritis, unspecified site: Secondary | ICD-10-CM | POA: Insufficient documentation

## 2013-09-18 DIAGNOSIS — Z87891 Personal history of nicotine dependence: Secondary | ICD-10-CM | POA: Insufficient documentation

## 2013-09-18 DIAGNOSIS — IMO0002 Reserved for concepts with insufficient information to code with codable children: Secondary | ICD-10-CM | POA: Insufficient documentation

## 2013-09-18 DIAGNOSIS — E039 Hypothyroidism, unspecified: Secondary | ICD-10-CM | POA: Insufficient documentation

## 2013-09-18 DIAGNOSIS — Z91040 Latex allergy status: Secondary | ICD-10-CM | POA: Insufficient documentation

## 2013-09-18 DIAGNOSIS — Y929 Unspecified place or not applicable: Secondary | ICD-10-CM | POA: Insufficient documentation

## 2013-09-18 DIAGNOSIS — Z79899 Other long term (current) drug therapy: Secondary | ICD-10-CM | POA: Insufficient documentation

## 2013-09-18 MED ORDER — MORPHINE SULFATE 4 MG/ML IJ SOLN
4.0000 mg | Freq: Once | INTRAMUSCULAR | Status: AC
  Administered 2013-09-18: 4 mg via INTRAVENOUS
  Filled 2013-09-18: qty 1

## 2013-09-18 MED ORDER — ONDANSETRON HCL 4 MG/2ML IJ SOLN
4.0000 mg | Freq: Once | INTRAMUSCULAR | Status: AC
  Administered 2013-09-18: 4 mg via INTRAVENOUS
  Filled 2013-09-18: qty 2

## 2013-09-18 MED ORDER — OXYCODONE-ACETAMINOPHEN 5-325 MG PO TABS: 2.0000 | ORAL_TABLET | ORAL | Status: DC | PRN

## 2013-09-18 NOTE — ED Notes (Signed)
ems gave pt 50 mcg of fenatanly. Pt with #20 in right ac.
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
## 2013-09-18 NOTE — Telephone Encounter (Signed)
Patient called stating she dropped information off from having a previous colonoscopy done in [facility],  Patient stated she was told by the gastro doctor that she was referred to they will cancel her appointment  If the information is not showing in there system -Please contact  Patient and advise

## 2013-09-18 NOTE — ED Notes (Signed)
Bed: GN56 Expected date: 09/18/13 Expected time: 9:26 PM Means of arrival: Ambulance Comments: Fall, knee pain

## 2013-09-19 HISTORY — PX: COLONOSCOPY W/ POLYPECTOMY: SHX1380

## 2013-09-19 MED ORDER — OXYCODONE-ACETAMINOPHEN 5-325 MG PO TABS
2.0000 | ORAL_TABLET | Freq: Once | ORAL | Status: AC
  Administered 2013-09-19: 2 via ORAL
  Filled 2013-09-19: qty 2

## 2013-09-24 NOTE — Telephone Encounter (Signed)
Padonda, pt stated that you had seen this report.  Its not scanned in and I can't find it.  Do you know where it is?

## 2013-09-24 NOTE — Telephone Encounter (Signed)
It was sent to scan. I have no idea where it is after it leaves my office. Request records if unable to locate

## 2013-09-24 NOTE — Telephone Encounter (Signed)
Left message for pt to call back  °

## 2013-09-26 ENCOUNTER — Ambulatory Visit (INDEPENDENT_AMBULATORY_CARE_PROVIDER_SITE_OTHER): Payer: BC Managed Care – PPO | Admitting: Internal Medicine

## 2013-09-26 ENCOUNTER — Encounter: Payer: Self-pay | Admitting: Internal Medicine

## 2013-09-26 VITALS — BP 120/90 | HR 82 | Temp 97.5°F | Wt 212.0 lb

## 2013-09-26 DIAGNOSIS — R059 Cough, unspecified: Secondary | ICD-10-CM

## 2013-09-26 DIAGNOSIS — M069 Rheumatoid arthritis, unspecified: Secondary | ICD-10-CM | POA: Insufficient documentation

## 2013-09-26 DIAGNOSIS — R05 Cough: Secondary | ICD-10-CM

## 2013-09-26 MED ORDER — PREDNISONE 10 MG PO TABS: 10.0000 mg | ORAL_TABLET | Freq: Every day | ORAL | Status: DC

## 2013-09-26 MED ORDER — PROMETHAZINE-CODEINE 6.25-10 MG/5ML PO SYRP: 10.0000 mL | ORAL_SOLUTION | Freq: Every evening | ORAL | Status: DC | PRN

## 2013-09-26 NOTE — Progress Notes (Signed)
Pre visit review using our clinic review tool, if applicable. No additional management support is needed unless otherwise documented below in the visit note. 

## 2013-09-26 NOTE — Progress Notes (Signed)
Subjective:    Patient ID: Paula Jacobs, female    DOB: 03-26-1961, 53 y.o.   MRN: [id number]  HPI Cold and cough for the last 10 days: congestion, wheezing. Mucus is yellow. She has had some fever in the beginning but with ibuprofen and theraflu she has done better. Mild myalgia. She is not taking any medication for cough. No N/V/D.   Past Medical History  Diagnosis Date  . HYPOTHYROIDISM 10/17/2007  . ANEMIA-NOS 04/23/2008  . DEPRESSION 04/23/2008  . Palpitations   . Osteoarthritis   . Hypertension    Past Surgical History  Procedure Laterality Date  . Appendectomy    . Inguinal hernia repair    . Thyroidectomy  2001    for nodules  . Neck fusion      x3  . Bunionectomy  10/2009 right & 02/03/10 left  . Abdominal hysterectomy     Family History  Problem Relation Age of Onset  . Cancer Mother     unknown  . Dementia Father   . Heart attack Maternal Grandmother   . Stroke Maternal Grandmother    History   Social History  . Marital Status: Married    Spouse Name: N/A    Number of Children: N/A  . Years of Education: N/A   Occupational History  . Not on file.   Social History Main Topics  . Smoking status: Former Smoker -- 0.50 packs/day for 20 years    Quit date: 09/15/2011  . Smokeless tobacco: Never Used  . Alcohol Use: Yes     Comment: rare  . Drug Use: No  . Sexual Activity: Not on file   Other Topics Concern  . Not on file   Social History Narrative  . No narrative on file    Current Outpatient Prescriptions on File Prior to Visit  Medication Sig Dispense Refill  . ALPRAZolam (XANAX) 0.5 MG tablet Take 0.5 mg by mouth at bedtime as needed for sleep.      Marland Kitchen lisinopril-hydrochlorothiazide (PRINZIDE,ZESTORETIC) 10-12.5 MG per tablet Take 1 tablet by mouth daily.      Marland Kitchen omeprazole (PRILOSEC) 40 MG capsule Take 1 capsule (40 mg total) by mouth daily.  30 capsule  3  . oxyCODONE-acetaminophen (PERCOCET) 5-325 MG per tablet Take 2 tablets by mouth  every 4 (four) hours as needed.  20 tablet  0  . potassium chloride SA (K-DUR,KLOR-CON) 20 MEQ tablet Take 1 tablet (20 mEq total) by mouth daily.  30 tablet  3  . thyroid (ARMOUR THYROID) 120 MG tablet Take 1 tablet (120 mg total) by mouth daily.  90 tablet  1   No current facility-administered medications on file prior to visit.      Review of Systems System review is negative for any constitutional, cardiac, pulmonary, GI or neuro symptoms or complaints other than as described in the HPI.     Objective:   Physical Exam Filed Vitals:   09/26/13 1057  BP: 120/90  Pulse: 82  Temp: 97.5 F (36.4 C)   Wt Readings from Last 3 Encounters:  09/26/13 212 lb (96.163 kg)  09/16/13 200 lb (90.719 kg)  09/13/13 200 lb (90.719 kg)   BP Readings from Last 3 Encounters:  09/26/13 120/90  09/18/13 138/79  09/16/13 110/78   Gen'l - obese woman in no distress HEENT- TMs normal, no sinus tenderness Cor- RRR Pulm - CTAP with deep inspiration Neuro - A&O x 3.       Assessment & Plan:  Cough - no signs of an active bacterial infection; lungs are clear. Plan Robitussin DM every 4 hours during the day  Phenergan/codeine - 1-2 tsp at bedtime for cough and sleep  Prednisone 20 mg daily x 7 days - to treat the inflammation and irritation of the trachea which keeps you coughing (double up on you current dose)  Hydrate.

## 2013-09-26 NOTE — Patient Instructions (Signed)
Cough - no signs of an active bacterial infection; lungs are clear. Plan Robitussin DM every 4 hours during the day  Phenergan/codeine - 1-2 tsp at bedtime for cough and sleep  Prednisone 20 mg daily x 7 days - to treat the inflammation and irritation of the trachea which keeps you coughing (double up on you current dose)  Hydrate.

## 2013-09-26 NOTE — Telephone Encounter (Signed)
Report was just scanned in per Padonda.  Pt aware

## 2013-10-02 ENCOUNTER — Ambulatory Visit (AMBULATORY_SURGERY_CENTER): Payer: BC Managed Care – PPO | Admitting: *Deleted

## 2013-10-02 VITALS — Ht 62.0 in | Wt 210.2 lb

## 2013-10-02 DIAGNOSIS — Z800 Family history of malignant neoplasm of digestive organs: Secondary | ICD-10-CM

## 2013-10-02 DIAGNOSIS — Z86010 Personal history of colonic polyps: Secondary | ICD-10-CM

## 2013-10-02 MED ORDER — MOVIPREP 100 G PO SOLR: 1.0000 | Freq: Once | ORAL | Status: DC

## 2013-10-02 NOTE — Progress Notes (Signed)
Denies allergies to eggs or soy products. Denies complications with sedation or anesthesia. 

## 2013-10-03 ENCOUNTER — Encounter: Payer: Self-pay | Admitting: Internal Medicine

## 2013-10-14 ENCOUNTER — Other Ambulatory Visit: Payer: BC Managed Care – PPO

## 2013-10-16 ENCOUNTER — Encounter: Payer: Self-pay | Admitting: *Deleted

## 2013-10-16 ENCOUNTER — Encounter: Payer: Self-pay | Admitting: Internal Medicine

## 2013-10-16 ENCOUNTER — Ambulatory Visit (AMBULATORY_SURGERY_CENTER): Payer: BC Managed Care – PPO | Admitting: Internal Medicine

## 2013-10-16 VITALS — BP 122/75 | HR 70 | Temp 97.6°F | Resp 20 | Ht 62.0 in | Wt 210.0 lb

## 2013-10-16 DIAGNOSIS — D126 Benign neoplasm of colon, unspecified: Secondary | ICD-10-CM

## 2013-10-16 DIAGNOSIS — Z86010 Personal history of colonic polyps: Secondary | ICD-10-CM

## 2013-10-16 DIAGNOSIS — Z800 Family history of malignant neoplasm of digestive organs: Secondary | ICD-10-CM

## 2013-10-16 MED ORDER — SODIUM CHLORIDE 0.9 % IV SOLN: 500.0000 mL | INTRAVENOUS | Status: DC

## 2013-10-16 MED ORDER — OMEPRAZOLE 40 MG PO CPDR: 40.0000 mg | DELAYED_RELEASE_CAPSULE | Freq: Every day | ORAL | Status: DC

## 2013-10-16 NOTE — Patient Instructions (Signed)

## 2013-10-16 NOTE — Progress Notes (Signed)
Stable to RR 

## 2013-10-16 NOTE — Progress Notes (Signed)
Called to room to assist during endoscopic procedure.  Patient ID and intended procedure confirmed with present staff. Received instructions for my participation in the procedure from the performing physician.  

## 2013-10-16 NOTE — Op Note (Signed)
[facility]  [address]. [place], [address]   COLONOSCOPY PROCEDURE REPORT  PATIENT: Paula, Jacobs  MR#: [id number] BIRTHDATE: 07/19/1961 , 52  yrs. old GENDER: Female ENDOSCOPIST: Lafayette Dragon, MD REFERRED ID:[id number] PROCEDURE DATE:  10/16/2013 PROCEDURE:   Colonoscopy with cold biopsy polypectomy First Screening Colonoscopy - Avg.  risk and is 50 yrs.  old or older - No.  Prior Negative Screening - Now for repeat screening. N/A  History of Adenoma - Now for follow-up colonoscopy & has been > or = to 3 yrs.  Polyps Removed Today? Yes. ASA CLASS:   Class II INDICATIONS:ccolonoscopy in [place] in May 2014 showed 2 cm cecal polyp which was a tubal villous adenoma.  Positive family history of colon cancer in a direct relative. MEDICATIONS: MAC sedation, administered by CRNA and Propofol (Diprivan) 340 mg IV  DESCRIPTION OF PROCEDURE:   After the risks benefits and alternatives of the procedure were thoroughly explained, informed consent was obtained.  A digital rectal exam revealed no abnormalities of the rectum.   The LB [id number]  endoscope was introduced through the anus and advanced to the cecum, which was identified by both the appendix and ileocecal valve. No adverse events experienced.   The quality of the prep was good, using MoviPrep  The instrument was then slowly withdrawn as the colon was fully examined.      COLON FINDINGS: Three polypoid shaped sessile polyps ranging between 3-61mm in size were found at the cecum x2.Endo Clip was intubated in the cecal pouch at the site of the prior polyp pieces were taken from around the metal clip to remove remaining polyp tissue.,another 3 mm polyp was  located  in the vicinity and was removed with cold biopsies .there were no other remnants from the cecal polyp ring .sigmoid polyp was removed with cold biopsies from 10 cm  The resection was complete and the polyp tissue was completely  retrieved.  Retroflexed views revealed no abnormalities. The time to cecum=9 minutes 11 seconds.  Withdrawal time=12 minutes 32 seconds.  The scope was withdrawn and the procedure completed. COMPLICATIONS: There were no complications.  ENDOSCOPIC IMPRESSION: Three sessile polyps ranging between 3-4mm in size were found at the cecum and in the sigmoid colon; polypectomy was performed with cold forceps Metal clip embedded in the cecal pouch at the site of previous polypectomy from 01/2013, not removed  RECOMMENDATIONS: 1.  Await pathology results 2.  high-fiber diet Recall colonoscopy pending biopsies Upper abdominal ultrasound says dyspepsia Trial of PPI Office visit for followup of dyspepsia   eSigned:  Lafayette Dragon, MD 10/16/2013 8:49 AM   cc:

## 2013-10-17 ENCOUNTER — Telehealth: Payer: Self-pay | Admitting: *Deleted

## 2013-10-17 ENCOUNTER — Other Ambulatory Visit (INDEPENDENT_AMBULATORY_CARE_PROVIDER_SITE_OTHER): Payer: BC Managed Care – PPO

## 2013-10-17 ENCOUNTER — Other Ambulatory Visit: Payer: Self-pay | Admitting: *Deleted

## 2013-10-17 DIAGNOSIS — E039 Hypothyroidism, unspecified: Secondary | ICD-10-CM

## 2013-10-17 DIAGNOSIS — R1013 Epigastric pain: Secondary | ICD-10-CM

## 2013-10-17 LAB — TSH: TSH: 0.51 u[IU]/mL (ref 0.35–5.50)

## 2013-10-17 NOTE — Telephone Encounter (Signed)
Message copied by Hulan Saas on Thu Oct 17, 2013  9:28 AM ------      Message from: Lafayette Dragon      Created: Wed Oct 16, 2013  1:28 PM       Yes, please, schedule uppe abd. Ultrasound for dyspepsia before her  OV. thanx DB      ----- Message -----         From: Hulan Saas, RN         Sent: 10/16/2013   9:26 AM           To: Lafayette Dragon, MD            Dr. Olevia Perches,      I was not clear on procedure report if this patient needs a abdominal ultrasound for dyspepsia.  Does she?      Letrell Attwood       ------

## 2013-10-17 NOTE — Telephone Encounter (Signed)
Patient given appointment date, time and instructions for Korea and OV.

## 2013-10-17 NOTE — Telephone Encounter (Signed)
Scheduled Korea of abdomen at [facility] radiology(alisha) on 10/21/13 at 8:00 AM. NPO after midnight.Scheduled OV on 10/29/13 at 3:00 PM with Dr. Olevia Perches. Left a message for patient to call me.

## 2013-10-17 NOTE — Telephone Encounter (Signed)
Disregard call,cancelled call

## 2013-10-17 NOTE — Telephone Encounter (Signed)
  Follow up Call-  Call back number 10/16/2013  Post procedure Call Back phone  # [phone]  Permission to leave phone message Yes     Patient questions:  Do you have a fever, pain , or abdominal swelling? no Pain Score  0 *  Have you tolerated food without any problems? yes  Have you been able to return to your normal activities? yes  Do you have any questions about your discharge instructions: Diet   no Medications  no Follow up visit  no  Do you have questions or concerns about your Care? no  Actions: * If pain score is 4 or above: No action needed, pain <4.

## 2013-10-18 ENCOUNTER — Encounter: Payer: Self-pay | Admitting: *Deleted

## 2013-10-21 ENCOUNTER — Ambulatory Visit (HOSPITAL_COMMUNITY)
Admission: RE | Admit: 2013-10-21 | Discharge: 2013-10-21 | Disposition: A | Discharge status: Home / Self Care | Payer: BC Managed Care – PPO | Source: Ambulatory Visit | Attending: Internal Medicine | Admitting: Internal Medicine

## 2013-10-21 DIAGNOSIS — R0989 Other specified symptoms and signs involving the circulatory and respiratory systems: Secondary | ICD-10-CM | POA: Insufficient documentation

## 2013-10-21 DIAGNOSIS — R109 Unspecified abdominal pain: Secondary | ICD-10-CM | POA: Insufficient documentation

## 2013-10-21 DIAGNOSIS — R1013 Epigastric pain: Secondary | ICD-10-CM

## 2013-10-21 DIAGNOSIS — R0609 Other forms of dyspnea: Secondary | ICD-10-CM | POA: Insufficient documentation

## 2013-10-22 ENCOUNTER — Encounter: Payer: Self-pay | Admitting: Internal Medicine

## 2013-10-23 ENCOUNTER — Encounter: Payer: Self-pay | Admitting: *Deleted

## 2013-10-29 ENCOUNTER — Encounter: Payer: BC Managed Care – PPO | Admitting: Internal Medicine

## 2013-10-29 NOTE — Progress Notes (Signed)
This encounter was created in error - please disregard.

## 2013-11-12 ENCOUNTER — Ambulatory Visit: Payer: BC Managed Care – PPO | Admitting: Family

## 2013-11-14 ENCOUNTER — Ambulatory Visit: Payer: BC Managed Care – PPO | Admitting: Family

## 2013-11-22 ENCOUNTER — Ambulatory Visit: Payer: BC Managed Care – PPO | Admitting: Family

## 2013-11-27 ENCOUNTER — Encounter: Payer: Self-pay | Admitting: Family

## 2013-11-27 ENCOUNTER — Ambulatory Visit (INDEPENDENT_AMBULATORY_CARE_PROVIDER_SITE_OTHER): Payer: BC Managed Care – PPO | Admitting: Family

## 2013-11-27 ENCOUNTER — Telehealth: Payer: Self-pay | Admitting: Internal Medicine

## 2013-11-27 VITALS — BP 112/78 | HR 87 | Temp 97.9°F | Wt 216.0 lb

## 2013-11-27 DIAGNOSIS — E669 Obesity, unspecified: Secondary | ICD-10-CM

## 2013-11-27 DIAGNOSIS — E039 Hypothyroidism, unspecified: Secondary | ICD-10-CM

## 2013-11-27 DIAGNOSIS — R0781 Pleurodynia: Secondary | ICD-10-CM

## 2013-11-27 DIAGNOSIS — I10 Essential (primary) hypertension: Secondary | ICD-10-CM

## 2013-11-27 DIAGNOSIS — R079 Chest pain, unspecified: Secondary | ICD-10-CM

## 2013-11-27 DIAGNOSIS — E876 Hypokalemia: Secondary | ICD-10-CM

## 2013-11-27 LAB — TSH: TSH: 7.95 u[IU]/mL — ABNORMAL HIGH (ref 0.35–5.50)

## 2013-11-27 LAB — COMPREHENSIVE METABOLIC PANEL
ALK PHOS: 50 U/L (ref 39–117)
ALT: 16 U/L (ref 0–35)
AST: 13 U/L (ref 0–37)
Albumin: 3.6 g/dL (ref 3.5–5.2)
BUN: 14 mg/dL (ref 6–23)
CO2: 26 mEq/L (ref 19–32)
Calcium: 8.6 mg/dL (ref 8.4–10.5)
Chloride: 107 mEq/L (ref 96–112)
Creatinine, Ser: 0.7 mg/dL (ref 0.4–1.2)
GFR: 106.38 mL/min (ref >60.00)
Glucose, Bld: 78 mg/dL (ref 70–99)
POTASSIUM: 3.6 meq/L (ref 3.5–5.1)
SODIUM: 139 meq/L (ref 135–145)
Total Bilirubin: 0.3 mg/dL (ref 0.3–1.2)
Total Protein: 6.6 g/dL (ref 6.0–8.3)

## 2013-11-27 MED ORDER — AMLODIPINE BESYLATE 5 MG PO TABS: 5.0000 mg | ORAL_TABLET | Freq: Every day | ORAL | Status: DC

## 2013-11-27 NOTE — Progress Notes (Signed)
Subjective:    Patient ID: Paula Jacobs, female    DOB: 03/30/1961, 53 y.o.   MRN: [id number]  HPI 53 y.o. Black female who presents today with chief complaints of " Cramping due to bp medicine, rib pain and SOB when walking up stairs". Pt states that the cramping is all day, almost every day and at time almost brings her to tears. Pt states that she knows she should be taking potassium supplements but that they make her nauseous. She does not get any relief from the cramping. Pt states that her rib pain is a 8-9 on a 1-10 scale, it last for about one hour, occurs at least twice every day, does not radiate, is dull at times and sharp at other times and IBuprofen helps relieve pain. She recently had an Ultra Sound done that showed structures were normal according to patient. Pt states she gets SOB when walking up stairs and when laying down. This has been occuring for 3 months now. Pt denies fever, fatigue, chills, change in appetite.     Review of Systems  Constitutional: Positive for activity change.  HENT: Negative.   Eyes: Negative.   Respiratory: Positive for shortness of breath.        When walking up stairs and laying down.   Cardiovascular: Negative.   Gastrointestinal: Negative.   Endocrine: Negative.   Genitourinary: Negative.   Musculoskeletal:       Constant generalized cramping   Skin: Negative.   Neurological: Negative.   Hematological: Negative.   Psychiatric/Behavioral: Negative.    Past Medical History  Diagnosis Date  . HYPOTHYROIDISM 10/17/2007  . ANEMIA-NOS 04/23/2008  . DEPRESSION 04/23/2008  . Palpitations   . Osteoarthritis   . Hypertension   . Tubulovillous adenoma of colon     History   Social History  . Marital Status: Married    Spouse Name: N/A    Number of Children: 2  . Years of Education: 14   Occupational History  .  [employer]   Social History Main Topics  . Smoking status: Former Smoker -- 0.50 packs/day for 20 years    Quit  date: 09/15/2011  . Smokeless tobacco: Never Used  . Alcohol Use: Yes     Comment: rare  . Drug Use: No  . Sexual Activity: Not on file   Other Topics Concern  . 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 Married - 2006  1 son '92  1 dtr '94. Work - at [employer].    Past Surgical History  Procedure Laterality Date  . Appendectomy    . Inguinal hernia repair    . Thyroidectomy  2001    for nodules  . Neck fusion      x3  . Bunionectomy  10/2009 right & 02/03/10 left  . Abdominal hysterectomy      Family History  Problem Relation Age of Onset  . Cancer Mother     unknown  . Colon cancer Mother   . Dementia Father   . Heart attack Maternal Grandmother   . Stroke Maternal Grandmother   . Diabetes Maternal Grandmother   . Heart disease Maternal Grandmother   . Esophageal cancer Neg Hx   . Rectal cancer Neg Hx   . Stomach cancer Neg Hx     Allergies  Allergen Reactions  . Hydrocodone-Acetaminophen Itching  . Latex Itching  . Tramadol Hcl     REACTION:  itching, nausea    Current Outpatient Prescriptions on File Prior to Visit  Medication Sig Dispense Refill  . ALPRAZolam (XANAX) 0.5 MG tablet Take 0.5 mg by mouth at bedtime as needed for sleep.      Marland Kitchen omeprazole (PRILOSEC) 40 MG capsule Take 1 capsule (40 mg total) by mouth daily.  30 capsule  6  . potassium chloride SA (K-DUR,KLOR-CON) 20 MEQ tablet Take 1 tablet (20 mEq total) by mouth daily.  30 tablet  3  . predniSONE (DELTASONE) 10 MG tablet Take 1 tablet (10 mg total) by mouth daily.  21 tablet  0  . thyroid (ARMOUR THYROID) 120 MG tablet Take 1 tablet (120 mg total) by mouth daily.  90 tablet  1   No current facility-administered medications on file prior to visit.    BP 112/78  Pulse 87  Temp(Src) 97.9 F (36.6 C) (Oral)  Wt 216 lb (97.977 kg)  SpO2 98%chart    Objective:   Physical Exam  Constitutional: She is oriented to person, place, and time. She appears  well-developed and well-nourished. She is active.  Cardiovascular: Normal rate, regular rhythm and normal heart sounds.   Pulmonary/Chest: Effort normal and breath sounds normal.  Neurological: She is alert and oriented to person, place, and time.  Skin: Skin is warm, dry and intact.  Psychiatric: She has a normal mood and affect. Her speech is normal and behavior is normal. Thought content normal.          Assessment & Plan:  53 y.o. Black female presents today with multiple chief complaints including: "cramping, rib pain and SOB with walking stairs and laying down".  - Cramping/Rib pain: Pt has been taking Hydrochlorothiazide and not taking potassium supplements as instructed.   - CMP to check electrolytes  - DC Lisinopril/Hydrochlorothiazide and start Norvas 5mg  po. QDAY -Hypothyroid: Recheck TSH to see if any adjustment needs to be made.  - Education: Exercise daily, Eat healthy diet, eat foods high in potassium.  - Follow up: 2 weeks

## 2013-11-27 NOTE — Telephone Encounter (Signed)
Relevant patient education assigned to patient using Emmi. ° °

## 2013-11-27 NOTE — Patient Instructions (Signed)

## 2013-11-27 NOTE — Progress Notes (Signed)
Pre visit review using our clinic review tool, if applicable. No additional management support is needed unless otherwise documented below in the visit note. 

## 2013-11-28 ENCOUNTER — Telehealth: Payer: Self-pay | Admitting: Internal Medicine

## 2013-11-28 NOTE — Telephone Encounter (Signed)
Pt would like a referral to another orthopedic, not [facility]. Pt was a pt there before, and states they gave her a hard time when she tried to make an appt w/ them. They were rude, and acting like they did not want to schedule her anything. Pt states her knee is hurting severely and would like appt with ortho ASAP. Whoever you think would be good, just not [facility].

## 2013-11-29 MED ORDER — THYROID 130 MG PO TABS: 130.0000 mg | ORAL_TABLET | Freq: Every day | ORAL | Status: DC

## 2013-11-29 NOTE — Telephone Encounter (Signed)
Left message to advise pt to call back with reason for ortho referral

## 2013-11-29 NOTE — Telephone Encounter (Signed)
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 She is awaiting a returned call concerning that. Advise pt that if she still needs a referral for ortho she will need to schedule an appt for documentation purposes because we have nothing documented about her knee injury.  Pt also states that she has been taking her thyroid medication consistently. Increase armour thyroid to 130mg , per Padonda. Pt awared

## 2013-12-12 ENCOUNTER — Ambulatory Visit: Payer: BC Managed Care – PPO | Admitting: Family

## 2013-12-19 ENCOUNTER — Ambulatory Visit (INDEPENDENT_AMBULATORY_CARE_PROVIDER_SITE_OTHER): Payer: BC Managed Care – PPO | Admitting: Family

## 2013-12-19 ENCOUNTER — Encounter: Payer: Self-pay | Admitting: Family

## 2013-12-19 VITALS — BP 120/84 | HR 88 | Wt 222.0 lb

## 2013-12-19 DIAGNOSIS — R609 Edema, unspecified: Secondary | ICD-10-CM

## 2013-12-19 DIAGNOSIS — I10 Essential (primary) hypertension: Secondary | ICD-10-CM

## 2013-12-19 DIAGNOSIS — M069 Rheumatoid arthritis, unspecified: Secondary | ICD-10-CM

## 2013-12-19 DIAGNOSIS — E039 Hypothyroidism, unspecified: Secondary | ICD-10-CM

## 2013-12-19 MED ORDER — ALPRAZOLAM 0.5 MG PO TABS: 0.5000 mg | ORAL_TABLET | Freq: Every evening | ORAL | Status: DC | PRN

## 2013-12-19 MED ORDER — FUROSEMIDE 20 MG PO TABS: 20.0000 mg | ORAL_TABLET | Freq: Every day | ORAL | Status: DC | PRN

## 2013-12-19 NOTE — Patient Instructions (Signed)

## 2013-12-19 NOTE — Progress Notes (Signed)
Pre visit review using our clinic review tool, if applicable. No additional management support is needed unless otherwise documented below in the visit note. 

## 2013-12-19 NOTE — Progress Notes (Signed)
Subjective:    Patient ID: Paula Jacobs, female    DOB: 15-Mar-1961, 53 y.o.   MRN: [id number]  HPI 53 year old [place] American female, nonsmoker who presents today for recheck of hypertension. At her last office visit we discontinued lisinopril HCT due to cramping and initiated Norvasc 5 mg once a day. She is doing well on the medication and it is controlling her blood pressure well. However, is beginning to have peripheral edema. Is not currently exercising. Has a history of rheumatoid arthritis and is on prednisone daily.   Review of Systems  Constitutional: Negative.   HENT: Negative.   Respiratory: Negative.   Cardiovascular: Positive for leg swelling.  Gastrointestinal: Negative.   Endocrine: Negative.   Genitourinary: Negative.   Musculoskeletal: Negative.   Skin: Negative.   Neurological: Negative.   Hematological: Negative.   Psychiatric/Behavioral: Negative.    Past Medical History  Diagnosis Date  . HYPOTHYROIDISM 10/17/2007  . ANEMIA-NOS 04/23/2008  . DEPRESSION 04/23/2008  . Palpitations   . Osteoarthritis   . Hypertension   . Tubulovillous adenoma of colon     History   Social History  . Marital Status: Married    Spouse Name: N/A    Number of Children: 2  . Years of Education: 14   Occupational History  .  [employer]   Social History Main Topics  . Smoking status: Former Smoker -- 0.50 packs/day for 20 years    Quit date: 09/15/2011  . Smokeless tobacco: Never Used  . Alcohol Use: Yes     Comment: rare  . Drug Use: No  . Sexual Activity: Not on file   Other Topics Concern  . 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 Married - 2006  1 son '92  1 dtr '94. Work - at [employer].    Past Surgical History  Procedure Laterality Date  . Appendectomy    . Inguinal hernia repair    . Thyroidectomy  2001    for nodules  . Neck fusion      x3  . Bunionectomy  10/2009 right & 02/03/10 left  . Abdominal  hysterectomy      Family History  Problem Relation Age of Onset  . Cancer Mother     unknown  . Colon cancer Mother   . Dementia Father   . Heart attack Maternal Grandmother   . Stroke Maternal Grandmother   . Diabetes Maternal Grandmother   . Heart disease Maternal Grandmother   . Esophageal cancer Neg Hx   . Rectal cancer Neg Hx   . Stomach cancer Neg Hx     Allergies  Allergen Reactions  . Hydrocodone-Acetaminophen Itching  . Latex Itching  . Tramadol Hcl     REACTION: itching, nausea    Current Outpatient Prescriptions on File Prior to Visit  Medication Sig Dispense Refill  . amLODipine (NORVASC) 5 MG tablet Take 1 tablet (5 mg total) by mouth daily.  90 tablet  3  . omeprazole (PRILOSEC) 40 MG capsule Take 1 capsule (40 mg total) by mouth daily.  30 capsule  6  . potassium chloride SA (K-DUR,KLOR-CON) 20 MEQ tablet Take 1 tablet (20 mEq total) by mouth daily.  30 tablet  3  . predniSONE (DELTASONE) 10 MG tablet Take 1 tablet (10 mg total) by mouth daily.  21 tablet  0  . thyroid (ARMOUR) 130 MG tablet Take 1 tablet (130 mg total) by mouth  daily.  90 tablet  1   No current facility-administered medications on file prior to visit.    BP 120/84  Pulse 88  Wt 222 lb (100.699 kg)chart    Objective:   Physical Exam  Constitutional: She is oriented to person, place, and time. She appears well-developed and well-nourished.  HENT:  Right Ear: External ear normal.  Left Ear: External ear normal.  Nose: Nose normal.  Mouth/Throat: Oropharynx is clear and moist.  Neck: Normal range of motion. Neck supple.  Cardiovascular: Normal rate, regular rhythm and normal heart sounds.   Pulmonary/Chest: Effort normal and breath sounds normal.  Abdominal: Soft. Bowel sounds are normal.  Musculoskeletal: She exhibits edema. She exhibits no tenderness.  Neurological: She is oriented to person, place, and time.  Skin: Skin is warm and dry.  Psychiatric: She has a normal mood and  affect.          Assessment & Plan:  Paula Jacobs was seen today for hypertension.  Diagnoses and associated orders for this visit:  Rheumatoid arthritis(714.0)  Unspecified essential hypertension  Unspecified hypothyroidism  Peripheral edema  Other Orders - furosemide (LASIX) 20 MG tablet; Take 1 tablet (20 mg total) by mouth daily as needed. - ALPRAZolam (XANAX) 0.5 MG tablet; Take 1 tablet (0.5 mg total) by mouth at bedtime as needed for sleep.   Call the office with any questions or concerns. Recheck as scheduled

## 2014-01-14 ENCOUNTER — Ambulatory Visit (INDEPENDENT_AMBULATORY_CARE_PROVIDER_SITE_OTHER): Payer: BC Managed Care – PPO | Admitting: Family

## 2014-01-14 ENCOUNTER — Encounter: Payer: Self-pay | Admitting: Family

## 2014-01-14 VITALS — BP 144/80 | HR 87 | Temp 98.0°F | Wt 222.0 lb

## 2014-01-14 DIAGNOSIS — H6120 Impacted cerumen, unspecified ear: Secondary | ICD-10-CM

## 2014-01-14 DIAGNOSIS — I10 Essential (primary) hypertension: Secondary | ICD-10-CM

## 2014-01-14 DIAGNOSIS — J45909 Unspecified asthma, uncomplicated: Secondary | ICD-10-CM

## 2014-01-14 MED ORDER — AMLODIPINE BESY-BENAZEPRIL HCL 5-10 MG PO CAPS: 1.0000 | ORAL_CAPSULE | Freq: Every day | ORAL | Status: DC

## 2014-01-14 MED ORDER — METHYLPREDNISOLONE 4 MG PO KIT: PACK | ORAL | Status: AC

## 2014-01-14 NOTE — Progress Notes (Signed)
Subjective:    Patient ID: Paula Jacobs, female    DOB: [birth year], 53 y.o.   MRN: [id number]  HPI 53 year old African American female, nonsmoker is in today with complaints of cough, congestion, sore throat x4 days and worsening. She's been taking TheraFlu and Advil without much relief. Has a history of allergic rhinitis. Also reports elevated blood pressure that was found and the neurologist's office. Due to having an aneurysm, they have suggested her blood pressure be less than 762 systolically. Also reports cerumen impaction bilaterally.   Review of Systems  Constitutional: Negative.   HENT: Positive for congestion, postnasal drip, rhinorrhea, sneezing and sore throat.        Cerumen impaction  Respiratory: Positive for cough and wheezing.   Cardiovascular: Negative.   Gastrointestinal: Negative.   Endocrine: Negative.   Genitourinary: Negative.   Musculoskeletal: Negative.   Skin: Negative.   Allergic/Immunologic: Positive for environmental allergies.  Psychiatric/Behavioral: Negative.    Past Medical History  Diagnosis Date  . HYPOTHYROIDISM 10/17/2007  . ANEMIA-NOS 04/23/2008  . DEPRESSION 04/23/2008  . Palpitations   . Osteoarthritis   . Hypertension   . Tubulovillous adenoma of colon     History   Social History  . Marital Status: Married    Spouse Name: N/A    Number of Children: 2  . Years of Education: 14   Occupational History  .  [employer]   Social History Main Topics  . Smoking status: Former Smoker -- 0.50 packs/day for 20 years    Quit date: 09/15/2011  . Smokeless tobacco: Never Used  . Alcohol Use: Yes     Comment: rare  . Drug Use: No  . Sexual Activity: Not on file   Other Topics Concern  . 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 Married - 2006  1 son '92  1 dtr '94. Work - at [employer].    Past Surgical History  Procedure Laterality Date  . Appendectomy    . Inguinal hernia repair    .  Thyroidectomy  2001    for nodules  . Neck fusion      x3  . Bunionectomy  10/2009 right & 02/03/10 left  . Abdominal hysterectomy      Family History  Problem Relation Age of Onset  . Cancer Mother     unknown  . Colon cancer Mother   . Dementia Father   . Heart attack Maternal Grandmother   . Stroke Maternal Grandmother   . Diabetes Maternal Grandmother   . Heart disease Maternal Grandmother   . Esophageal cancer Neg Hx   . Rectal cancer Neg Hx   . Stomach cancer Neg Hx     Allergies  Allergen Reactions  . Hydrocodone-Acetaminophen Itching  . Latex Itching  . Tramadol Hcl     REACTION: itching, nausea    Current Outpatient Prescriptions on File Prior to Visit  Medication Sig Dispense Refill  . ALPRAZolam (XANAX) 0.5 MG tablet Take 1 tablet (0.5 mg total) by mouth at bedtime as needed for sleep.  30 tablet  2  . furosemide (LASIX) 20 MG tablet Take 1 tablet (20 mg total) by mouth daily as needed.  30 tablet  3  . omeprazole (PRILOSEC) 40 MG capsule Take 1 capsule (40 mg total) by mouth daily.  30 capsule  6  . potassium chloride SA (K-DUR,KLOR-CON) 20 MEQ tablet Take 1 tablet (20 mEq total) by  mouth daily.  30 tablet  3  . thyroid (ARMOUR) 130 MG tablet Take 1 tablet (130 mg total) by mouth daily.  90 tablet  1   No current facility-administered medications on file prior to visit.    BP 144/80  Pulse 87  Temp(Src) 98 F (36.7 C) (Oral)  Wt 222 lb (100.699 kg)  SpO2 95%chart    Objective:   Physical Exam  Constitutional: She is oriented to person, place, and time. She appears well-developed and well-nourished.  HENT:  Nose: Nose normal.  Mouth/Throat: Oropharynx is clear and moist.  Cerumen impaction bilaterally.   Neck: Normal range of motion. Neck supple.  Cardiovascular: Normal rate, regular rhythm and normal heart sounds.   Pulmonary/Chest: Effort normal and breath sounds normal.  Abdominal: Soft. Bowel sounds are normal.  Musculoskeletal: Normal range of  motion.  Neurological: She is alert and oriented to person, place, and time.  Skin: Skin is warm and dry.  Psychiatric: She has a normal mood and affect.          Assessment & Plan:  Romayne was seen today for sore throat, generalized body aches, otalgia and nasal congestion.  Diagnoses and associated orders for this visit:  Hay fever with asthma  Cerumen impaction  Unspecified essential hypertension  Other Orders - amLODipine-benazepril (LOTREL) 5-10 MG per capsule; Take 1 capsule by mouth daily. - methylPREDNISolone (MEDROL DOSEPAK) 4 MG tablet; follow package directions   Recheck in 2-3 week to assess blood pressure.

## 2014-01-14 NOTE — Patient Instructions (Signed)
Sodium-Controlled Diet Sodium is a mineral. It is found in many foods. Sodium may be found naturally or added during the making of a food. The most common form of sodium is salt, which is made up of sodium and chloride. Reducing your sodium intake involves changing your eating habits. The following guidelines will help you reduce the sodium in your diet:  Stop using the salt shaker.  Use salt sparingly in cooking and baking.  Substitute with sodium-free seasonings and spices.  Do not use a salt substitute (potassium chloride) without your caregiver's permission.  Include a variety of fresh, unprocessed foods in your diet.  Limit the use of processed and convenience foods that are high in sodium. USE THE FOLLOWING FOODS SPARINGLY: Breads/Starches  Commercial bread stuffing, commercial pancake or waffle mixes, coating mixes. Waffles. Croutons. Prepared (boxed or frozen) potato, rice, or noodle mixes that contain salt or sodium. Salted French fries or hash browns. Salted popcorn, breads, crackers, chips, or snack foods. Vegetables  Vegetables canned with salt or prepared in cream, butter, or cheese sauces. Sauerkraut. Tomato or vegetable juices canned with salt.  Fresh vegetables are allowed if rinsed thoroughly. Fruit  Fruit is okay to eat. Meat and Meat Substitutes  Salted or smoked meats, such as bacon or Canadian bacon, chipped or corned beef, hot dogs, salt pork, luncheon meats, pastrami, ham, or sausage. Canned or smoked fish, poultry, or meat. Processed cheese or cheese spreads, blue or Roquefort cheese. Battered or frozen fish products. Prepared spaghetti sauce. Baked beans. Reuben sandwiches. Salted nuts. Caviar. Milk  Limit buttermilk to 1 cup per week. Soups and Combination Foods  Bouillon cubes, canned or dried soups, broth, consomm. Convenience (frozen or packaged) dinners with more than 600 mg sodium. Pot pies, pizza, Asian food, fast food cheeseburgers, and specialty  sandwiches. Desserts and Sweets  Regular (salted) desserts, pie, commercial fruit snack pies, commercial snack cakes, canned puddings.  Eat desserts and sweets in moderation. Fats and Oils  Gravy mixes or canned gravy. No more than 1 to 2 tbs of salad dressing. Chip dips.  Eat fats and oils in moderation. Beverages  See those listed under the vegetables and milk groups. Condiments  Ketchup, mustard, meat sauces, salsa, regular (salted) and lite soy sauce or mustard. Dill pickles, olives, meat tenderizer. Prepared horseradish or pickle relish. Dutch-processed cocoa. Baking powder or baking soda used medicinally. Worcestershire sauce. "Light" salt. Salt substitute, unless approved by your caregiver. Document Released: 02/25/2002 Document Revised: 11/28/2011 Document Reviewed: 09/28/2009 Exit[employer] Patient Information 2014 [employer].  

## 2014-01-14 NOTE — Progress Notes (Signed)
Pre visit review using our clinic review tool, if applicable. No additional management support is needed unless otherwise documented below in the visit note. 

## 2014-01-16 ENCOUNTER — Other Ambulatory Visit: Payer: Self-pay | Admitting: Neurology

## 2014-01-16 DIAGNOSIS — I729 Aneurysm of unspecified site: Secondary | ICD-10-CM

## 2014-01-23 ENCOUNTER — Ambulatory Visit
Admission: RE | Admit: 2014-01-23 | Discharge: 2014-01-23 | Disposition: A | Discharge status: Home / Self Care | Payer: BC Managed Care – PPO | Source: Ambulatory Visit | Attending: Neurology | Admitting: Neurology

## 2014-01-23 DIAGNOSIS — I729 Aneurysm of unspecified site: Secondary | ICD-10-CM

## 2014-02-18 ENCOUNTER — Ambulatory Visit: Payer: BC Managed Care – PPO | Admitting: Family

## 2014-03-25 ENCOUNTER — Other Ambulatory Visit: Payer: Self-pay | Admitting: Family

## 2014-03-26 NOTE — Telephone Encounter (Signed)
Last filled December 19, 2013 for #30 with 2 refills. Ok to fill?

## 2014-03-27 NOTE — Telephone Encounter (Signed)
Pt called back to ask if her ALPRAZolam Duanne Moron) 0.5 MG tablet is going to refilled   Pharmacy [facility] [address]

## 2014-03-28 ENCOUNTER — Encounter: Payer: Self-pay | Admitting: Family

## 2014-03-28 ENCOUNTER — Ambulatory Visit (INDEPENDENT_AMBULATORY_CARE_PROVIDER_SITE_OTHER): Payer: BC Managed Care – PPO | Admitting: Family

## 2014-03-28 VITALS — BP 118/78 | HR 88 | Wt 218.3 lb

## 2014-03-28 DIAGNOSIS — G4700 Insomnia, unspecified: Secondary | ICD-10-CM

## 2014-03-28 DIAGNOSIS — I10 Essential (primary) hypertension: Secondary | ICD-10-CM

## 2014-03-28 MED ORDER — TRAZODONE HCL 50 MG PO TABS: 25.0000 mg | ORAL_TABLET | Freq: Every evening | ORAL | Status: DC | PRN

## 2014-03-28 NOTE — Progress Notes (Signed)
Subjective:    Patient ID: Paula Jacobs, female    DOB: 1960/11/04, 53 y.o.   MRN: [id number]  HPI 53 year old nonsmoking African American female presents to clinic for follow up on insomnia. Her PMH is complicated by hypothryroidism, HTN, RA, and insomnia. She has been taking 0.5 mg Xanax daily. Her last dose of Xanax was on Sunday. She denies any symptoms of withdrawal at this time.    Review of Systems  Constitutional: Negative.   HENT: Negative.   Eyes: Negative.   Respiratory: Negative.   Cardiovascular: Negative.   Gastrointestinal: Negative.   Endocrine: Negative.   Genitourinary: Negative.   Musculoskeletal: Positive for arthralgias.       RA - managed by Rheumatologist   Skin: Negative.   Allergic/Immunologic: Negative.   Neurological: Negative.   Hematological: Negative.   Psychiatric/Behavioral: Positive for sleep disturbance. Negative for suicidal ideas, confusion, self-injury and agitation.   Past Medical History  Diagnosis Date  . HYPOTHYROIDISM 10/17/2007  . ANEMIA-NOS 04/23/2008  . DEPRESSION 04/23/2008  . Palpitations   . Osteoarthritis   . Hypertension   . Tubulovillous adenoma of colon     History   Social History  . Marital Status: Married    Spouse Name: N/A    Number of Children: 2  . Years of Education: 14   Occupational History  .  [employer]   Social History Main Topics  . Smoking status: Former Smoker -- 0.50 packs/day for 20 years    Quit date: 09/15/2011  . Smokeless tobacco: Never Used  . Alcohol Use: Yes     Comment: rare  . Drug Use: No  . Sexual Activity: Not on file   Other Topics Concern  . 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 Married - 2006  1 son '92  1 dtr '94. Work - at [employer].    Past Surgical History  Procedure Laterality Date  . Appendectomy    . Inguinal hernia repair    . Thyroidectomy  2001    for nodules  . Neck fusion      x3  . Bunionectomy  10/2009  right & 02/03/10 left  . Abdominal hysterectomy      Family History  Problem Relation Age of Onset  . Cancer Mother     unknown  . Colon cancer Mother   . Dementia Father   . Heart attack Maternal Grandmother   . Stroke Maternal Grandmother   . Diabetes Maternal Grandmother   . Heart disease Maternal Grandmother   . Esophageal cancer Neg Hx   . Rectal cancer Neg Hx   . Stomach cancer Neg Hx     Allergies  Allergen Reactions  . Hydrocodone-Acetaminophen Itching  . Latex Itching  . Tramadol Hcl     REACTION: itching, nausea    Current Outpatient Prescriptions on File Prior to Visit  Medication Sig Dispense Refill  . ALPRAZolam (XANAX) 0.5 MG tablet Take 1 tablet (0.5 mg total) by mouth at bedtime as needed for sleep.  30 tablet  2  . amLODipine-benazepril (LOTREL) 5-10 MG per capsule Take 1 capsule by mouth daily.  30 capsule  2  . furosemide (LASIX) 20 MG tablet Take 1 tablet (20 mg total) by mouth daily as needed.  30 tablet  3  . omeprazole (PRILOSEC) 40 MG capsule Take 1 capsule (40 mg total) by mouth daily.  30 capsule  6  .  potassium chloride SA (K-DUR,KLOR-CON) 20 MEQ tablet Take 1 tablet (20 mEq total) by mouth daily.  30 tablet  3  . thyroid (ARMOUR) 130 MG tablet Take 1 tablet (130 mg total) by mouth daily.  90 tablet  1   No current facility-administered medications on file prior to visit.    BP 118/78  Pulse 88  Wt 218 lb 4.8 oz (99.02 kg)chart     Objective:   Physical Exam  Constitutional: She is oriented to person, place, and time. She appears well-developed and well-nourished.  HENT:  Head: Normocephalic and atraumatic.  Eyes: EOM are normal. Pupils are equal, round, and reactive to light.  Neck: Normal range of motion.  Cardiovascular: Normal rate, regular rhythm and normal heart sounds.   Pulmonary/Chest: Effort normal and breath sounds normal.  Musculoskeletal: She exhibits no edema.  Cane as assistive device  Neurological: She is alert and  oriented to person, place, and time.  Skin: Skin is warm and dry.  Psychiatric: She has a normal mood and affect. Her behavior is normal. Judgment and thought content normal.      Assessment & Plan:  Paula Jacobs was seen today for medication refill.  Diagnoses and associated orders for this visit:  Insomnia - traZODone (DESYREL) 50 MG tablet; Take 0.5-1 tablets (25-50 mg total) by mouth at bedtime as needed for sleep.   Follow up in 3 weeks.

## 2014-03-28 NOTE — Progress Notes (Signed)
Pre visit review using our clinic review tool, if applicable. No additional management support is needed unless otherwise documented below in the visit note. 

## 2014-03-28 NOTE — Patient Instructions (Signed)
Insomnia Insomnia is frequent trouble falling and/or staying asleep. Insomnia can be a long term problem or a short term problem. Both are common. Insomnia can be a short term problem when the wakefulness is related to a certain stress or worry. Long term insomnia is often related to ongoing stress during waking hours and/or poor sleeping habits. Overtime, sleep deprivation itself can make the problem worse. Every little thing feels more severe because you are overtired and your ability to cope is decreased. CAUSES   Stress, anxiety, and depression.  Poor sleeping habits.  Distractions such as TV in the bedroom.  Naps close to bedtime.  Engaging in emotionally charged conversations before bed.  Technical reading before sleep.  Alcohol and other sedatives. They may make the problem worse. They can hurt normal sleep patterns and normal dream activity.  Stimulants such as caffeine for several hours prior to bedtime.  Pain syndromes and shortness of breath can cause insomnia.  Exercise late at night.  Changing time zones may cause sleeping problems (jet lag). It is sometimes helpful to have someone observe your sleeping patterns. They should look for periods of not breathing during the night (sleep apnea). They should also look to see how long those periods last. If you live alone or observers are uncertain, you can also be observed at a sleep clinic where your sleep patterns will be professionally monitored. Sleep apnea requires a checkup and treatment. Give your caregivers your medical history. Give your caregivers observations your family has made about your sleep.  SYMPTOMS   Not feeling rested in the morning.  Anxiety and restlessness at bedtime.  Difficulty falling and staying asleep. TREATMENT   Your caregiver may prescribe treatment for an underlying medical disorders. Your caregiver can give advice or help if you are using alcohol or other drugs for self-medication. Treatment  of underlying problems will usually eliminate insomnia problems.  Medications can be prescribed for short time use. They are generally not recommended for lengthy use.  Over-the-counter sleep medicines are not recommended for lengthy use. They can be habit forming.  You can promote easier sleeping by making lifestyle changes such as:  Using relaxation techniques that help with breathing and reduce muscle tension.  Exercising earlier in the day.  Changing your diet and the time of your last meal. No night time snacks.  Establish a regular time to go to bed.  Counseling can help with stressful problems and worry.  Soothing music and white noise may be helpful if there are background noises you cannot remove.  Stop tedious detailed work at least one hour before bedtime. HOME CARE INSTRUCTIONS   Keep a diary. Inform your caregiver about your progress. This includes any medication side effects. See your caregiver regularly. Take note of:  Times when you are asleep.  Times when you are awake during the night.  The quality of your sleep.  How you feel the next day. This information will help your caregiver care for you.  Get out of bed if you are still awake after 15 minutes. Read or do some quiet activity. Keep the lights down. Wait until you feel sleepy and go back to bed.  Keep regular sleeping and waking hours. Avoid naps.  Exercise regularly.  Avoid distractions at bedtime. Distractions include watching television or engaging in any intense or detailed activity like attempting to balance the household checkbook.  Develop a bedtime ritual. Keep a familiar routine of bathing, brushing your teeth, climbing into bed at the same   time each night, listening to soothing music. Routines increase the success of falling to sleep faster.  Use relaxation techniques. This can be using breathing and muscle tension release routines. It can also include visualizing peaceful scenes. You can  also help control troubling or intruding thoughts by keeping your mind occupied with boring or repetitive thoughts like the old concept of counting sheep. You can make it more creative like imagining planting one beautiful flower after another in your backyard garden.  During your day, work to eliminate stress. When this is not possible use some of the previous suggestions to help reduce the anxiety that accompanies stressful situations. MAKE SURE YOU:   Understand these instructions.  Will watch your condition.  Will get help right away if you are not doing well or get worse. Document Released: 09/02/2000 Document Revised: 11/28/2011 Document Reviewed: 10/03/2007 Exit[employer] Patient Information 2015 [employer]. This information is not intended to replace advice given to you by your health care provider. Make sure you discuss any questions you have with your health care provider.  

## 2014-04-02 ENCOUNTER — Encounter: Payer: Self-pay | Admitting: Internal Medicine

## 2014-04-03 ENCOUNTER — Encounter: Payer: Self-pay | Admitting: *Deleted

## 2014-04-12 ENCOUNTER — Other Ambulatory Visit: Payer: Self-pay | Admitting: Family

## 2014-04-18 ENCOUNTER — Encounter: Payer: Self-pay | Admitting: Family

## 2014-04-18 ENCOUNTER — Ambulatory Visit (INDEPENDENT_AMBULATORY_CARE_PROVIDER_SITE_OTHER): Payer: BC Managed Care – PPO | Admitting: Family

## 2014-04-18 VITALS — BP 118/80 | HR 87 | Wt 218.0 lb

## 2014-04-18 DIAGNOSIS — N951 Menopausal and female climacteric states: Secondary | ICD-10-CM

## 2014-04-18 DIAGNOSIS — G4700 Insomnia, unspecified: Secondary | ICD-10-CM

## 2014-04-18 DIAGNOSIS — Z1239 Encounter for other screening for malignant neoplasm of breast: Secondary | ICD-10-CM

## 2014-04-18 DIAGNOSIS — Z1231 Encounter for screening mammogram for malignant neoplasm of breast: Secondary | ICD-10-CM

## 2014-04-18 DIAGNOSIS — I10 Essential (primary) hypertension: Secondary | ICD-10-CM

## 2014-04-18 NOTE — Progress Notes (Signed)
Pre visit review using our clinic review tool, if applicable. No additional management support is needed unless otherwise documented below in the visit note. 

## 2014-04-18 NOTE — Progress Notes (Signed)
Subjective:    Patient ID: Paula Jacobs, female    DOB: [birth year], 53 y.o.   MRN: [id number]  HPI 53 year old [place] American female, nonsmoker is in today for recheck of insomnia. She has a history of hypertension, hypothyroidism. Patient had initially stopped Xanax and then off of the medication for about 3 days prior to starting trazodone for sleep. Reports feeling muscle weakness and feeling strange overall after discontinuing the Xanax. Had been on the medication for several months. She discontinued the trazodone as a result.  Also has concerns of vaginal dryness. Had a partial hysterectomy 4 years ago. She is not sexually active and denies any desire for intercourse.   Review of Systems  Constitutional: Negative.   HENT: Negative.   Respiratory: Negative.   Cardiovascular: Negative.   Gastrointestinal: Negative.   Endocrine: Positive for heat intolerance. Negative for cold intolerance, polydipsia and polyphagia.  Genitourinary: Negative.        Vaginal dryness  Musculoskeletal: Negative.   Skin: Negative.   Neurological: Negative.   Psychiatric/Behavioral: Negative.    Past Medical History  Diagnosis Date  . HYPOTHYROIDISM 10/17/2007  . ANEMIA-NOS 04/23/2008  . DEPRESSION 04/23/2008  . Palpitations   . Osteoarthritis   . Hypertension   . Tubulovillous adenoma of colon   . Hyperplastic colon polyp     History   Social History  . Marital Status: Married    Spouse Name: N/A    Number of Children: 2  . Years of Education: 14   Occupational History  .  [employer]   Social History Main Topics  . Smoking status: Former Smoker -- 0.50 packs/day for 20 years    Quit date: 09/15/2011  . Smokeless tobacco: Never Used  . Alcohol Use: Yes     Comment: rare  . Drug Use: No  . Sexual Activity: Not on file   Other Topics Concern  . 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 Married - 2006  1 son '92  1 dtr '94. Work - at [employer].    Past Surgical History  Procedure Laterality Date  . Appendectomy    . Inguinal hernia repair    . Thyroidectomy  2001    for nodules  . Neck fusion      x3  . Bunionectomy  10/2009 right & 02/03/10 left  . Abdominal hysterectomy      Family History  Problem Relation Age of Onset  . Colon cancer Mother   . Dementia Father   . Heart attack Maternal Grandmother   . Stroke Maternal Grandmother   . Diabetes Maternal Grandmother   . Heart disease Maternal Grandmother   . Esophageal cancer Neg Hx   . Rectal cancer Neg Hx   . Stomach cancer Neg Hx     Allergies  Allergen Reactions  . Hydrocodone-Acetaminophen Itching  . Latex Itching  . Tramadol Hcl     REACTION: itching, nausea    Current Outpatient Prescriptions on File Prior to Visit  Medication Sig Dispense Refill  . amLODipine-benazepril (LOTREL) 5-10 MG per capsule take 1 capsule by mouth once daily  30 capsule  2  . furosemide (LASIX) 20 MG tablet Take 1 tablet (20 mg total) by mouth daily as needed.  30 tablet  3  . omeprazole (PRILOSEC) 40 MG capsule Take 1 capsule (40 mg total) by mouth daily.  30 capsule  6  . potassium chloride SA (K-DUR,KLOR-CON)  20 MEQ tablet Take 1 tablet (20 mEq total) by mouth daily.  30 tablet  3  . thyroid (ARMOUR) 130 MG tablet Take 1 tablet (130 mg total) by mouth daily.  90 tablet  1  . traZODone (DESYREL) 50 MG tablet Take 0.5-1 tablets (25-50 mg total) by mouth at bedtime as needed for sleep.  30 tablet  2   No current facility-administered medications on file prior to visit.    BP 118/80  Pulse 87  Wt 218 lb (98.884 kg)chart    Objective:   Physical Exam  Constitutional: She is oriented to person, place, and time. She appears well-developed and well-nourished.  HENT:  Right Ear: External ear normal.  Left Ear: External ear normal.  Nose: Nose normal.  Mouth/Throat: Oropharynx is clear and moist.  Neck: Normal range of motion. Neck supple. No thyromegaly  present.  Cardiovascular: Normal rate, regular rhythm and normal heart sounds.   Pulmonary/Chest: Effort normal and breath sounds normal.  Abdominal: Soft. Bowel sounds are normal.  Musculoskeletal: Normal range of motion.  Neurological: She is alert and oriented to person, place, and time.  Skin: Skin is warm and dry.  Psychiatric: She has a normal mood and affect.          Assessment & Plan:  Paula Jacobs was seen today for follow-up.  Diagnoses and associated orders for this visit:  Menopausal symptom  Insomnia  Unspecified essential hypertension  Breast cancer screening, high risk patient - MM Digital Screening; Future    Resume trazodone at bedtime for sleep. Try OTC Estroven or a water based lubricant for vaginal dryness.Recheck for pelvic in 3 months.

## 2014-04-18 NOTE — Patient Instructions (Signed)
Menopause and Herbal Products Menopause is the normal time of life when menstrual periods stop completely. Menopause is complete when you have missed 12 consecutive menstrual periods. It usually occurs between the ages of 48 to 55, with an average age of 51. Very rarely does a woman develop menopause before 51 years old. At menopause, your ovaries stop producing the female hormones, estrogen and progesterone. This can cause undesirable symptoms and also affect your health. Sometimes the symptoms can occur 4 to 5 years before the menopause begins. There is no relationship between menopause and:  Oral contraceptives.  Number of children you had.  Race.  The age your menstrual periods started (menarche). Heavy smokers and very thin women may develop menopause earlier in life. Estrogen and progesterone hormone treatment is the usual method of treating menopausal symptoms. However, there are women who should not take hormone treatment. This is true of:   Women that have breast or uterine cancer.  Women who prefer not to take hormones because of certain side effects (abnormal uterine bleeding).  Women who are afraid that hormones may cause breast cancer.  Women who have a history of liver disease, heart disease, stroke, or blood clots. For these women, there are other medications that may help treat their menopausal symptoms. These medications are found in plants and botanical products. They can be found in the form of herbs, teas, oils, tinctures, and pills.  CAUSES:  The ovaries stop producing the female hormones estrogen and progesterone.  Other causes include:  Surgery to remove both ovaries.  The ovaries stop functioning for no know reason.  Tumors of the pituitary gland in the brain.  Medical disease that affects the ovaries and hormone production.  Radiation treatment to the abdomen or pelvis.  Chemotherapy that affects the ovaries. PHYTOESTROGENS: Phytoestrogens occur  naturally in plants and plant products. They act like estrogen in the body. Herbal medications are made from these plants and botanical steroids. There are 3 types of phytoestrogens:  Isoflavones (genistein and daidzein) are found in soy, garbanzo beans, miso and tofu foods.  Ligins are found in the shell of seeds. They are used to make oils like flaxseed oil. The bacteria in your intestine act on these foods to produce the estrogen-like hormones.  Coumestans are estrogen-like. Some of the foods they are found in include sunflower seeds and bean sprouts. CONDITIONS AND THEIR POSSIBLE HERBAL TREATMENT:  Hot flashes and night sweats.  Soy, black cohosh and evening primrose.  Irritability, insomnia, depression and memory problems.  Chasteberry, ginseng, and soy.  St. John's wort may be helpful for depression. However, there is a concern of it causing cataracts of the eye and may have bad effects on other medications. St. John's wort should not be taken for long time and without your caregiver's advice.  Loss of libido and vaginal and skin dryness.  Wild yam and soy.  Prevention of coronary heart disease and osteoporosis.  Soy and Isoflavones. Several studies have shown that some women benefit from herbal medications, but most of the studies have not consistently shown that these supplements are much better than placebo. Other forms of treatment to help women with menopausal symptoms include a balanced diet, rest, exercise, vitamin and calcium (with vitamin D) supplements, acupuncture, and group therapy when necessary. THOSE WHO SHOULD NOT TAKE HERBAL MEDICATIONS INCLUDE:  Women who are planning on getting pregnant unless told by your caregiver.  Women who are breastfeeding unless told by your caregiver.  Women who are taking other   prescription medications unless told by your caregiver.  Infants, children, and elderly women unless told by your caregiver. Different herbal medications  have different and unmeasured amounts of the herbal ingredients. There are no regulations, quality control, and standardization of the ingredients in herbal medications. Therefore, the amount of the ingredient in the medication may vary from one herb, pill, tea, oil or tincture to another. Many herbal medications can cause serious problems and can even have poisonous effects if taken too much or too long. If problems develop, the medication should be stopped and recorded by your caregiver. HOME CARE INSTRUCTIONS  Do not take or give children herbal medications without your caregiver's advice.  Let your caregiver know all the medications you are taking. This includes prescription, over-the-counter, eye drops, and creams.  Do not take herbal medications longer or more than recommended.  Tell your caregiver about any side effects from the medication. SEEK MEDICAL CARE IF:  You develop a fever of 102 F (38.9 C), or as directed by your caregiver.  You feel sick to your stomach (nauseous), vomit, or have diarrhea.  You develop a rash.  You develop abdominal pain.  You develop severe headaches.  You start to have vision problems.  You feel dizzy or faint.  You start to feel numbness in any part of your body.  You start shaking (have convulsions). Document Released: 02/22/2008 Document Revised: 08/22/2012 Document Reviewed: 09/21/2010 [facility] Patient Information 2015 [place], [place]. This information is not intended to replace advice given to you by your health care provider. Make sure you discuss any questions you have with your health care provider.  1. Trazadone at bedtime to help sleep 2. Consider Estoven OTC or a water based lubricant for vaginal dryness.

## 2014-04-30 ENCOUNTER — Ambulatory Visit: Payer: BC Managed Care – PPO

## 2014-05-02 ENCOUNTER — Ambulatory Visit
Admission: RE | Admit: 2014-05-02 | Discharge: 2014-05-02 | Disposition: A | Discharge status: Home / Self Care | Payer: BC Managed Care – PPO | Source: Ambulatory Visit | Attending: Family | Admitting: Family

## 2014-05-02 DIAGNOSIS — Z1239 Encounter for other screening for malignant neoplasm of breast: Secondary | ICD-10-CM

## 2014-05-05 ENCOUNTER — Other Ambulatory Visit: Payer: Self-pay | Admitting: Family

## 2014-05-05 DIAGNOSIS — R928 Other abnormal and inconclusive findings on diagnostic imaging of breast: Secondary | ICD-10-CM

## 2014-05-12 ENCOUNTER — Ambulatory Visit
Admission: RE | Admit: 2014-05-12 | Discharge: 2014-05-12 | Disposition: A | Discharge status: Home / Self Care | Payer: BC Managed Care – PPO | Source: Ambulatory Visit | Attending: Family | Admitting: Family

## 2014-05-12 DIAGNOSIS — R928 Other abnormal and inconclusive findings on diagnostic imaging of breast: Secondary | ICD-10-CM

## 2014-05-19 ENCOUNTER — Ambulatory Visit: Payer: BC Managed Care – PPO | Admitting: Family

## 2014-06-10 ENCOUNTER — Encounter: Payer: Self-pay | Admitting: Internal Medicine

## 2014-06-10 ENCOUNTER — Ambulatory Visit: Payer: BC Managed Care – PPO | Admitting: Internal Medicine

## 2014-06-10 NOTE — Progress Notes (Signed)
The patient's chart has been reviewed by Dr. Delfin Edis  and the recommendations are noted below.  No follow up necessary. If patient wishes to follow up, she may do so at her convenience.  I have attempted to reach patient at home number. There was no answer. Letter sent.

## 2014-07-18 ENCOUNTER — Encounter: Payer: Self-pay | Admitting: Family

## 2014-07-18 ENCOUNTER — Ambulatory Visit (INDEPENDENT_AMBULATORY_CARE_PROVIDER_SITE_OTHER): Payer: BC Managed Care – PPO | Admitting: Family

## 2014-07-18 VITALS — BP 130/80 | HR 87 | Wt 222.0 lb

## 2014-07-18 DIAGNOSIS — L739 Follicular disorder, unspecified: Secondary | ICD-10-CM

## 2014-07-18 DIAGNOSIS — M069 Rheumatoid arthritis, unspecified: Secondary | ICD-10-CM

## 2014-07-18 MED ORDER — CEPHALEXIN 500 MG PO CAPS: 500.0000 mg | ORAL_CAPSULE | Freq: Three times a day (TID) | ORAL | Status: DC

## 2014-07-18 NOTE — Progress Notes (Signed)
Subjective:    Patient ID: Paula Jacobs, female    DOB: Jul 09, 1961, 53 y.o.   MRN: [id number]  HPI 53 year old African-American female with a history of rheumatoid arthritis and osteoarthritis is in today with complaints of a rash to the back of her right upper arm present 4 days that she describes as itching and burning. Reports this rash pops up about once a month typically during stressful times and resolves on its own. She describes is blisters with clear drainage and tender to touch. Denies any changes in detergents, soaps, lotions. No new medications. Continues to have significant joint pain with rheumatoid arthritis.   Review of Systems  Constitutional: Negative.   HENT: Negative.   Respiratory: Negative.   Cardiovascular: Negative.   Endocrine: Negative.   Genitourinary: Negative.   Musculoskeletal: Positive for arthralgias.  Skin: Positive for rash. Pallor: rash to the back of the right arm.   Neurological: Negative.   Psychiatric/Behavioral: Negative.    Past Medical History  Diagnosis Date  . HYPOTHYROIDISM 10/17/2007  . ANEMIA-NOS 04/23/2008  . DEPRESSION 04/23/2008  . Palpitations   . Osteoarthritis   . Hypertension   . Tubulovillous adenoma of colon   . Hyperplastic colon polyp     History   Social History  . Marital Status: Married    Spouse Name: N/A    Number of Children: 2  . Years of Education: 14   Occupational History  .  [employer]   Social History Main Topics  . Smoking status: Former Smoker -- 0.50 packs/day for 20 years    Quit date: 09/15/2011  . Smokeless tobacco: Never Used  . Alcohol Use: Yes     Comment: rare  . Drug Use: No  . Sexual Activity: Not on file   Other Topics Concern  . 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 Married - 2006  1 son '92  1 dtr '94. Work - at [employer].    Past Surgical History  Procedure Laterality Date  . Appendectomy    . Inguinal hernia repair    .  Thyroidectomy  2001    for nodules  . Neck fusion      x3  . Bunionectomy  10/2009 right & 02/03/10 left  . Abdominal hysterectomy      Family History  Problem Relation Age of Onset  . Colon cancer Mother   . Dementia Father   . Heart attack Maternal Grandmother   . Stroke Maternal Grandmother   . Diabetes Maternal Grandmother   . Heart disease Maternal Grandmother   . Esophageal cancer Neg Hx   . Rectal cancer Neg Hx   . Stomach cancer Neg Hx     Allergies  Allergen Reactions  . Hydrocodone-Acetaminophen Itching  . Latex Itching  . Tramadol Hcl     REACTION: itching, nausea    Current Outpatient Prescriptions on File Prior to Visit  Medication Sig Dispense Refill  . amLODipine-benazepril (LOTREL) 5-10 MG per capsule take 1 capsule by mouth once daily  30 capsule  2  . furosemide (LASIX) 20 MG tablet Take 1 tablet (20 mg total) by mouth daily as needed.  30 tablet  3  . omeprazole (PRILOSEC) 40 MG capsule Take 1 capsule (40 mg total) by mouth daily.  30 capsule  6  . potassium chloride SA (K-DUR,KLOR-CON) 20 MEQ tablet Take 1 tablet (20 mEq total) by mouth daily.  30 tablet  3  . thyroid (ARMOUR) 130 MG tablet Take 1 tablet (130 mg total) by mouth daily.  90 tablet  1  . traZODone (DESYREL) 50 MG tablet Take 0.5-1 tablets (25-50 mg total) by mouth at bedtime as needed for sleep.  30 tablet  2   No current facility-administered medications on file prior to visit.    BP 130/80  Pulse 87  Wt 222 lb (100.699 kg)chart    Objective:   Physical Exam  Constitutional: She is oriented to person, place, and time. She appears well-developed and well-nourished.  Neck: Normal range of motion. Neck supple. No thyromegaly present.  Cardiovascular: Normal rate, regular rhythm and normal heart sounds.   Pulmonary/Chest: Effort normal and breath sounds normal.  Musculoskeletal: Normal range of motion.  Neurological: She is alert and oriented to person, place, and time.  Skin: Rash  noted.  Red, minimally tender rash to the posterior right upper arm. Clear drainage.  Psychiatric: She has a normal mood and affect.          Assessment & Plan:  Aivah was seen today for no specified reason.  Diagnoses and associated orders for this visit:  Folliculitis  Rheumatoid arthritis involving multiple joints  Other Orders - cephALEXin (KEFLEX) 500 MG capsule; Take 1 capsule (500 mg total) by mouth 3 (three) times daily.    We'll treat for folluculitis. Consider dermatology referral if necessary. Consider autoimmune cause. Continue current medications.

## 2014-07-18 NOTE — Progress Notes (Signed)
Pre visit review using our clinic review tool, if applicable. No additional management support is needed unless otherwise documented below in the visit note. 

## 2014-07-18 NOTE — Patient Instructions (Signed)

## 2014-08-15 ENCOUNTER — Other Ambulatory Visit: Payer: Self-pay | Admitting: Family

## 2014-09-03 ENCOUNTER — Ambulatory Visit: Payer: BC Managed Care – PPO | Admitting: Family

## 2014-10-04 ENCOUNTER — Other Ambulatory Visit: Payer: Self-pay | Admitting: Family

## 2014-10-15 ENCOUNTER — Encounter (HOSPITAL_COMMUNITY)
Admission: RE | Admit: 2014-10-15 | Discharge: 2014-10-15 | Disposition: A | Discharge status: Home / Self Care | Payer: BLUE CROSS/BLUE SHIELD | Source: Ambulatory Visit | Attending: Orthopedic Surgery | Admitting: Orthopedic Surgery

## 2014-10-15 ENCOUNTER — Other Ambulatory Visit: Payer: Self-pay

## 2014-10-15 ENCOUNTER — Ambulatory Visit (HOSPITAL_COMMUNITY)
Admission: RE | Admit: 2014-10-15 | Discharge: 2014-10-15 | Disposition: A | Discharge status: Home / Self Care | Payer: BLUE CROSS/BLUE SHIELD | Source: Ambulatory Visit | Attending: Anesthesiology | Admitting: Anesthesiology

## 2014-10-15 ENCOUNTER — Encounter (HOSPITAL_COMMUNITY): Payer: Self-pay

## 2014-10-15 DIAGNOSIS — I10 Essential (primary) hypertension: Secondary | ICD-10-CM

## 2014-10-15 HISTORY — DX: Rheumatoid arthritis, unspecified: M06.9

## 2014-10-15 HISTORY — DX: Other specified postprocedural states: R11.2

## 2014-10-15 HISTORY — DX: Unspecified osteoarthritis, unspecified site: M19.90

## 2014-10-15 HISTORY — DX: Other specified postprocedural states: Z98.890

## 2014-10-15 LAB — BASIC METABOLIC PANEL
ANION GAP: 7 (ref 5–15)
BUN: 14 mg/dL (ref 6–23)
CO2: 28 mmol/L (ref 19–32)
Calcium: 9 mg/dL (ref 8.4–10.5)
Chloride: 103 mmol/L (ref 96–112)
Creatinine, Ser: 0.6 mg/dL (ref 0.50–1.10)
GFR calc non Af Amer: >90 mL/min (ref >90)
Glucose, Bld: 107 mg/dL — ABNORMAL HIGH (ref 70–99)
POTASSIUM: 4 mmol/L (ref 3.5–5.1)
SODIUM: 138 mmol/L (ref 135–145)

## 2014-10-15 LAB — URINALYSIS, ROUTINE W REFLEX MICROSCOPIC
BILIRUBIN URINE: NEGATIVE
GLUCOSE, UA: NEGATIVE mg/dL
Hgb urine dipstick: NEGATIVE
KETONES UR: NEGATIVE mg/dL
LEUKOCYTES UA: NEGATIVE
NITRITE: NEGATIVE
Protein, ur: NEGATIVE mg/dL
SPECIFIC GRAVITY, URINE: 1.012 (ref 1.005–1.030)
Urobilinogen, UA: 0.2 mg/dL (ref 0.0–1.0)
pH: 7 (ref 5.0–8.0)

## 2014-10-15 LAB — CBC
HEMATOCRIT: 39 % (ref 36.0–46.0)
Hemoglobin: 12.1 g/dL (ref 12.0–15.0)
MCH: 26.8 pg (ref 26.0–34.0)
MCHC: 31 g/dL (ref 30.0–36.0)
MCV: 86.3 fL (ref 78.0–100.0)
Platelets: 421 10*3/uL — ABNORMAL HIGH (ref 150–400)
RBC: 4.52 MIL/uL (ref 3.87–5.11)
RDW: 14 % (ref 11.5–15.5)
WBC: 6.7 10*3/uL (ref 4.0–10.5)

## 2014-10-15 LAB — SURGICAL PCR SCREEN
MRSA, PCR: NEGATIVE
STAPHYLOCOCCUS AUREUS: POSITIVE — AB

## 2014-10-15 LAB — ABO/RH: ABO/RH(D): A POS

## 2014-10-15 LAB — APTT: APTT: 33 s (ref 24–37)

## 2014-10-15 LAB — PROTIME-INR
INR: 1.07 (ref 0.00–1.49)
PROTHROMBIN TIME: 14 s (ref 11.6–15.2)

## 2014-10-15 NOTE — Progress Notes (Signed)
10-15-14 1705 Positive for Staph aureus -Mupirocin called to [facility] [phone], pt  To use as directed. Note to Dr. Alvan Dame pod [phone].

## 2014-10-15 NOTE — Patient Instructions (Addendum)
Paula Jacobs  10/15/2014   Your procedure is scheduled on: Tuesday 10/21/14  Report to [facility] Main  Entrance and follow signs to               [place] at 09:55 AM.  Call this number if you have problems the morning of surgery [phone]   Remember:  Do not eat food or drink liquids :After Midnight.     Take these medicines the morning of surgery with A SIP OF WATER: thyroid                               You may not have any metal on your body including hair pins and              piercings  Do not wear jewelry, make-up, lotions, powders or perfumes.             Do not wear nail polish.  Do not shave  48 hours prior to surgery.              Men may shave face and neck.  Do not bring valuables to the hospital. [place].  Contacts, dentures or bridgework may not be worn into surgery.  Leave suitcase in the car. After surgery it may be brought to your room.               Please read over the following fact sheets you were given: MRSA information _____________________________________________________________________           [facility] - Preparing for Surgery Before surgery, you can play an important role.  Because skin is not sterile, your skin needs to be as free of germs as possible.  You can reduce the number of germs on your skin by washing with CHG (chlorahexidine gluconate) soap before surgery.  CHG is an antiseptic cleaner which kills germs and bonds with the skin to continue killing germs even after washing. Please DO NOT use if you have an allergy to CHG or antibacterial soaps.  If your skin becomes reddened/irritated stop using the CHG and inform your nurse when you arrive at Short Stay. Do not shave (including legs and underarms) for at least 48 hours prior to the first CHG shower.  You may shave your face/neck. Please follow these instructions carefully:  1.  Shower with CHG Soap the  night before surgery and the  morning of Surgery.  2.  If you choose to wash your hair, wash your hair first as usual with your  normal  shampoo.  3.  After you shampoo, rinse your hair and body thoroughly to remove the  shampoo.                            4.  Use CHG as you would any other liquid soap.  You can apply chg directly  to the skin and wash                       Gently with a scrungie or clean washcloth.  5.  Apply the CHG Soap to your body ONLY FROM THE NECK DOWN.   Do not use on face/  open                           Wound or open sores. Avoid contact with eyes, ears mouth and genitals (private parts).                       Wash face,  Genitals (private parts) with your normal soap.             6.  Wash thoroughly, paying special attention to the area where your surgery  will be performed.  7.  Thoroughly rinse your body with warm water from the neck down.  8.  DO NOT shower/wash with your normal soap after using and rinsing off  the CHG Soap.                9.  Pat yourself dry with a clean towel.            10.  Wear clean pajamas.            11.  Place clean sheets on your bed the night of your first shower and do not  sleep with pets. Day of Surgery : Do not apply any lotions/deodorants the morning of surgery.  Please wear clean clothes to the hospital/surgery center.  FAILURE TO FOLLOW THESE INSTRUCTIONS MAY RESULT IN THE CANCELLATION OF YOUR SURGERY PATIENT SIGNATURE_________________________________  NURSE SIGNATURE__________________________________  ________________________________________________________________________  WHAT IS A BLOOD TRANSFUSION? Blood Transfusion Information  A transfusion is the replacement of blood or some of its parts. Blood is made up of multiple cells which provide different functions.  Red blood cells carry oxygen and are used for blood loss replacement.  White blood cells fight against infection.  Platelets control bleeding.  Plasma  helps clot blood.  Other blood products are available for specialized needs, such as hemophilia or other clotting disorders. BEFORE THE TRANSFUSION  Who gives blood for transfusions?   Healthy volunteers who are fully evaluated to make sure their blood is safe. This is blood bank blood. Transfusion therapy is the safest it has ever been in the practice of medicine. Before blood is taken from a donor, a complete history is taken to make sure that person has no history of diseases nor engages in risky social behavior (examples are intravenous drug use or sexual activity with multiple partners). The donor's travel history is screened to minimize risk of transmitting infections, such as malaria. The donated blood is tested for signs of infectious diseases, such as HIV and hepatitis. The blood is then tested to be sure it is compatible with you in order to minimize the chance of a transfusion reaction. If you or a relative donates blood, this is often done in anticipation of surgery and is not appropriate for emergency situations. It takes many days to process the donated blood. RISKS AND COMPLICATIONS Although transfusion therapy is very safe and saves many lives, the main dangers of transfusion include:  1. Getting an infectious disease. 2. Developing a transfusion reaction. This is an allergic reaction to something in the blood you were given. Every precaution is taken to prevent this. The decision to have a blood transfusion has been considered carefully by your caregiver before blood is given. Blood is not given unless the benefits outweigh the risks. AFTER THE TRANSFUSION  Right after receiving a blood transfusion, you will usually feel much better and more energetic. This is especially true if your red blood  cells have gotten low (anemic). The transfusion raises the level of the red blood cells which carry oxygen, and this usually causes an energy increase.  The nurse administering the transfusion  will monitor you carefully for complications. HOME CARE INSTRUCTIONS  No special instructions are needed after a transfusion. You may find your energy is better. Speak with your caregiver about any limitations on activity for underlying diseases you may have. SEEK MEDICAL CARE IF:   Your condition is not improving after your transfusion.  You develop redness or irritation at the intravenous (IV) site. SEEK IMMEDIATE MEDICAL CARE IF:  Any of the following symptoms occur over the next 12 hours:  Shaking chills.  You have a temperature by mouth above 102 F (38.9 C), not controlled by medicine.  Chest, back, or muscle pain.  People around you feel you are not acting correctly or are confused.  Shortness of breath or difficulty breathing.  Dizziness and fainting.  You get a rash or develop hives.  You have a decrease in urine output.  Your urine turns a dark color or changes to pink, red, or brown. Any of the following symptoms occur over the next 10 days:  You have a temperature by mouth above 102 F (38.9 C), not controlled by medicine.  Shortness of breath.  Weakness after normal activity.  The white part of the eye turns yellow (jaundice).  You have a decrease in the amount of urine or are urinating less often.  Your urine turns a dark color or changes to pink, red, or brown. Document Released: 09/02/2000 Document Revised: 11/28/2011 Document Reviewed: 04/21/2008 ExitCare Patient Information 2014 [place].  _______________________________________________________________________  Incentive Spirometer  An incentive spirometer is a tool that can help keep your lungs clear and active. This tool measures how well you are filling your lungs with each breath. Taking long deep breaths may help reverse or decrease the chance of developing breathing (pulmonary) problems (especially infection) following:  A long period of time when you are unable to move or be  active. BEFORE THE PROCEDURE   If the spirometer includes an indicator to show your best effort, your nurse or respiratory therapist will set it to a desired goal.  If possible, sit up straight or lean slightly forward. Try not to slouch.  Hold the incentive spirometer in an upright position. INSTRUCTIONS FOR USE  3. Sit on the edge of your bed if possible, or sit up as far as you can in bed or on a chair. 4. Hold the incentive spirometer in an upright position. 5. Breathe out normally. 6. Place the mouthpiece in your mouth and seal your lips tightly around it. 7. Breathe in slowly and as deeply as possible, raising the piston or the ball toward the top of the column. 8. Hold your breath for 3-5 seconds or for as long as possible. Allow the piston or ball to fall to the bottom of the column. 9. Remove the mouthpiece from your mouth and breathe out normally. 10. Rest for a few seconds and repeat Steps 1 through 7 at least 10 times every 1-2 hours when you are awake. Take your time and take a few normal breaths between deep breaths. 11. The spirometer may include an indicator to show your best effort. Use the indicator as a goal to work toward during each repetition. 12. After each set of 10 deep breaths, practice coughing to be sure your lungs are clear. If you have an incision (the cut made  at the time of surgery), support your incision when coughing by placing a pillow or rolled up towels firmly against it. Once you are able to get out of bed, walk around indoors and cough well. You may stop using the incentive spirometer when instructed by your caregiver.  RISKS AND COMPLICATIONS  Take your time so you do not get dizzy or light-headed.  If you are in pain, you may need to take or ask for pain medication before doing incentive spirometry. It is harder to take a deep breath if you are having pain. AFTER USE  Rest and breathe slowly and easily.  It can be helpful to keep track of a log of  your progress. Your caregiver can provide you with a simple table to help with this. If you are using the spirometer at home, follow these instructions: [place] IF:   You are having difficultly using the spirometer.  You have trouble using the spirometer as often as instructed.  Your pain medication is not giving enough relief while using the spirometer.  You develop fever of 100.5 F (38.1 C) or higher. SEEK IMMEDIATE MEDICAL CARE IF:   You cough up bloody sputum that had not been present before.  You develop fever of 102 F (38.9 C) or greater.  You develop worsening pain at or near the incision site. MAKE SURE YOU:   Understand these instructions.  Will watch your condition.  Will get help right away if you are not doing well or get worse. Document Released: 01/16/2007 Document Revised: 11/28/2011 Document Reviewed: 03/19/2007 [facility] Patient Information 2014 [place], [place].   ________________________________________________________________________

## 2014-10-15 NOTE — Pre-Procedure Instructions (Signed)
10-15-14 1710- Pt. Left voice message to pick up Mupirocin Ointment from [facility] [address] [facility] and use as directed, please return a call to inform of your receiving this message.

## 2014-10-17 ENCOUNTER — Telehealth: Payer: Self-pay

## 2014-10-17 NOTE — Telephone Encounter (Signed)
Left message to advise pt surgical clearance form faxed to Ortho

## 2014-10-19 NOTE — H&P (Signed)
TOTAL KNEE ADMISSION H&P  Patient is being admitted for left total knee arthroplasty.  Subjective:  Chief Complaint:   Left knee primary OA / pain.  HPI: Paula Jacobs, 54 y.o. female, has a history of pain and functional disability in the left knee due to arthritis and has failed non-surgical conservative treatments for greater than 12 weeks to include NSAID's and/or analgesics, corticosteriod injections, viscosupplementation injections, use of assistive devices and activity modification.  Onset of symptoms was gradual, starting September 18, 2013 with gradually worsening course since that time. The patient noted prior procedures on the knee to include  arthroscopy and menisectomy on the left knee(s).  Patient currently rates pain in the left knee(s) at 8 out of 10 with activity. Patient has night pain, worsening of pain with activity and weight bearing, pain that interferes with activities of daily living, pain with passive range of motion, crepitus and joint swelling.  Patient has evidence of periarticular osteophytes and joint space narrowing by imaging studies.  There is no active infection.  Risks, benefits and expectations were discussed with the patient.  Risks including but not limited to the risk of anesthesia, blood clots, nerve damage, blood vessel damage, failure of the prosthesis, infection and up to and including death.  Patient understand the risks, benefits and expectations and wishes to proceed with surgery.   PCP: CAMPBELL, PADONDA BOYD, FNP  D/C Plans:      SNF  Post-op Meds:       No Rx given  Tranexamic Acid:      To be given - IV   Decadron:      Is to be given  FYI:     ASA post-op  Oxycodone post-op    Patient Active Problem List   Diagnosis Date Noted  . Rheumatoid arthritis 09/26/2013  . Acute pharyngitis 06/27/2012  . Hypertension 01/31/2011  . PAT 10/27/2009  . HYPOTHYROIDISM 10/17/2007   Past Medical History  Diagnosis Date  . HYPOTHYROIDISM  10/17/2007  . ANEMIA-NOS 04/23/2008  . DEPRESSION 04/23/2008  . Palpitations     freq at night  . Osteoarthritis   . Hypertension   . Tubulovillous adenoma of colon   . Hyperplastic colon polyp     x2  . RA (rheumatoid arthritis)     "problems in feet, hands, and knees"  . DJD (degenerative joint disease)   . PONV (postoperative nausea and vomiting)     only after 1979 surgery    Past Surgical History  Procedure Laterality Date  . Appendectomy    . Inguinal hernia repair    . Thyroidectomy  2001    for nodules  . Neck fusion      x3  . Bunionectomy  10/2009 right & 02/03/10 left  . Abdominal hysterectomy    . Colonoscopy w/ polypectomy  2015  . 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 Hammer toe surgery      "all toes have pins, done with bunionectomy"    No prescriptions prior to admission   Allergies  Allergen Reactions  . Hydrocodone-Acetaminophen Itching  . Latex Itching  . Tramadol Hcl     REACTION: itching, nausea    History  Substance Use Topics  . Smoking status: Former Smoker -- 0.50 packs/day for 33 years    Quit date: 09/15/2011  . Smokeless tobacco: Never Used  . Alcohol Use: Yes     Comment: rare    Family History  Problem Relation Age of Onset  . Colon cancer Mother   . Dementia Father   . Heart attack Maternal Grandmother   . Stroke Maternal Grandmother   . Diabetes Maternal Grandmother   . Heart disease Maternal Grandmother   . Esophageal cancer Neg Hx   . Rectal cancer Neg Hx   . Stomach cancer Neg Hx      Review of Systems  Constitutional: Negative.   HENT: Negative.   Eyes: Negative.   Respiratory: Negative.   Cardiovascular: Negative.   Gastrointestinal: Negative.   Genitourinary: Negative.   Musculoskeletal: Positive for joint pain.  Skin: Negative.   Neurological: Negative.   Endo/Heme/Allergies: Negative.   Psychiatric/Behavioral: Negative.     Objective:  Physical Exam  Constitutional: She is oriented to  person, place, and time. She appears well-developed and well-nourished.  HENT:  Head: Normocephalic and atraumatic.  Eyes: Pupils are equal, round, and reactive to light.  Neck: Neck supple. No JVD present. No tracheal deviation present. No thyromegaly present.  Cardiovascular: Normal rate, regular rhythm, normal heart sounds and intact distal pulses.   Respiratory: Effort normal and breath sounds normal. No respiratory distress. She has no wheezes.  GI: Soft. There is no tenderness. There is no guarding.  Musculoskeletal:       Left knee: She exhibits decreased range of motion, swelling and bony tenderness. She exhibits no ecchymosis, no deformity, no laceration and no erythema. Tenderness found.  Lymphadenopathy:    She has no cervical adenopathy.  Neurological: She is alert and oriented to person, place, and time.  Skin: Skin is warm and dry.  Psychiatric: She has a normal mood and affect.      Labs:  Estimated body mass index is 40.59 kg/(m^2) as calculated from the following:   Height as of 10/16/13: 5\' 2"  (1.575 m).   Weight as of 07/18/14: 100.699 kg (222 lb).   Imaging Review Plain radiographs demonstrate severe degenerative joint disease of the left knee(s). The overall alignment is neutral. The bone quality appears to be good for age and reported activity level.  Assessment/Plan:  End stage arthritis, left knee   The patient history, physical examination, clinical judgment of the provider and imaging studies are consistent with end stage degenerative joint disease of the left knee(s) and total knee arthroplasty is deemed medically necessary. The treatment options including medical management, injection therapy arthroscopy and arthroplasty were discussed at length. The risks and benefits of total knee arthroplasty were presented and reviewed. The risks due to aseptic loosening, infection, stiffness, patella tracking problems, thromboembolic complications and other  imponderables were discussed. The patient acknowledged the explanation, agreed to proceed with the plan and consent was signed. Patient is being admitted for inpatient treatment for surgery, pain control, PT, OT, prophylactic antibiotics, VTE prophylaxis, progressive ambulation and ADL's and discharge planning. The patient is planning to be discharged to skilled nursing facility.      West Pugh Rahel Carlton   PA-C  10/19/2014, 12:40 PM

## 2014-10-21 ENCOUNTER — Inpatient Hospital Stay (HOSPITAL_COMMUNITY): Payer: BLUE CROSS/BLUE SHIELD | Admitting: Anesthesiology

## 2014-10-21 ENCOUNTER — Encounter (HOSPITAL_COMMUNITY): Payer: Self-pay | Admitting: *Deleted

## 2014-10-21 ENCOUNTER — Inpatient Hospital Stay (HOSPITAL_COMMUNITY)
Admission: RE | Admit: 2014-10-21 | Discharge: 2014-10-22 | DRG: 470 | Disposition: A | Discharge status: Home Health | Payer: BLUE CROSS/BLUE SHIELD | Source: Ambulatory Visit | Attending: Orthopedic Surgery | Admitting: Orthopedic Surgery

## 2014-10-21 ENCOUNTER — Encounter (HOSPITAL_COMMUNITY)
Admission: RE | Disposition: A | Discharge status: Home Health | Payer: Self-pay | Source: Ambulatory Visit | Attending: Orthopedic Surgery

## 2014-10-21 DIAGNOSIS — Z96652 Presence of left artificial knee joint: Secondary | ICD-10-CM

## 2014-10-21 DIAGNOSIS — M659 Synovitis and tenosynovitis, unspecified: Secondary | ICD-10-CM | POA: Diagnosis present

## 2014-10-21 DIAGNOSIS — I10 Essential (primary) hypertension: Secondary | ICD-10-CM | POA: Diagnosis present

## 2014-10-21 DIAGNOSIS — Z89429 Acquired absence of other toe(s), unspecified side: Secondary | ICD-10-CM

## 2014-10-21 DIAGNOSIS — Z87891 Personal history of nicotine dependence: Secondary | ICD-10-CM | POA: Diagnosis not present

## 2014-10-21 DIAGNOSIS — Z6841 Body Mass Index (BMI) 40.0 and over, adult: Secondary | ICD-10-CM

## 2014-10-21 DIAGNOSIS — M25562 Pain in left knee: Secondary | ICD-10-CM | POA: Diagnosis present

## 2014-10-21 DIAGNOSIS — E039 Hypothyroidism, unspecified: Secondary | ICD-10-CM | POA: Diagnosis present

## 2014-10-21 DIAGNOSIS — M1712 Unilateral primary osteoarthritis, left knee: Secondary | ICD-10-CM | POA: Diagnosis present

## 2014-10-21 DIAGNOSIS — Z981 Arthrodesis status: Secondary | ICD-10-CM | POA: Diagnosis not present

## 2014-10-21 HISTORY — PX: TOTAL KNEE ARTHROPLASTY: SHX125

## 2014-10-21 HISTORY — DX: Presence of left artificial knee joint: Z96.652

## 2014-10-21 LAB — TYPE AND SCREEN
ABO/RH(D): A POS
Antibody Screen: NEGATIVE

## 2014-10-21 SURGERY — ARTHROPLASTY, KNEE, TOTAL
Anesthesia: Spinal | Site: Knee | Laterality: Left

## 2014-10-21 MED ORDER — MEPERIDINE HCL 50 MG/ML IJ SOLN: 6.2500 mg | INTRAMUSCULAR | Status: DC | PRN

## 2014-10-21 MED ORDER — METHOCARBAMOL 1000 MG/10ML IJ SOLN
500.0000 mg | Freq: Four times a day (QID) | INTRAMUSCULAR | Status: DC | PRN
  Administered 2014-10-21: 500 mg via INTRAVENOUS
  Filled 2014-10-21 (×2): qty 5

## 2014-10-21 MED ORDER — BISACODYL 10 MG RE SUPP: 10.0000 mg | Freq: Every day | RECTAL | Status: DC | PRN

## 2014-10-21 MED ORDER — SODIUM CHLORIDE 0.9 % IJ SOLN
INTRAMUSCULAR | Status: AC
  Filled 2014-10-21: qty 50

## 2014-10-21 MED ORDER — PROPOFOL 10 MG/ML IV BOLUS
INTRAVENOUS | Status: AC
  Filled 2014-10-21: qty 20

## 2014-10-21 MED ORDER — FERROUS SULFATE 325 (65 FE) MG PO TABS
325.0000 mg | ORAL_TABLET | Freq: Three times a day (TID) | ORAL | Status: DC
  Filled 2014-10-21 (×4): qty 1

## 2014-10-21 MED ORDER — PROPOFOL INFUSION 10 MG/ML OPTIME
INTRAVENOUS | Status: DC | PRN
  Administered 2014-10-21: 70 ug/kg/min via INTRAVENOUS

## 2014-10-21 MED ORDER — PROMETHAZINE HCL 25 MG/ML IJ SOLN: 6.2500 mg | INTRAMUSCULAR | Status: DC | PRN

## 2014-10-21 MED ORDER — MIDAZOLAM HCL 2 MG/2ML IJ SOLN
INTRAMUSCULAR | Status: AC
  Filled 2014-10-21: qty 2

## 2014-10-21 MED ORDER — AMLODIPINE BESYLATE 5 MG PO TABS
5.0000 mg | ORAL_TABLET | Freq: Once | ORAL | Status: AC
  Administered 2014-10-21: 5 mg via ORAL
  Filled 2014-10-21: qty 1

## 2014-10-21 MED ORDER — DEXAMETHASONE SODIUM PHOSPHATE 10 MG/ML IJ SOLN
INTRAMUSCULAR | Status: AC
  Filled 2014-10-21: qty 1

## 2014-10-21 MED ORDER — DEXAMETHASONE SODIUM PHOSPHATE 10 MG/ML IJ SOLN
10.0000 mg | Freq: Once | INTRAMUSCULAR | Status: AC
  Administered 2014-10-21: 10 mg via INTRAVENOUS

## 2014-10-21 MED ORDER — FENTANYL CITRATE 0.05 MG/ML IJ SOLN
INTRAMUSCULAR | Status: DC | PRN
  Administered 2014-10-21: 100 ug via INTRAVENOUS

## 2014-10-21 MED ORDER — ONDANSETRON HCL 4 MG PO TABS
4.0000 mg | ORAL_TABLET | Freq: Four times a day (QID) | ORAL | Status: DC | PRN
  Administered 2014-10-22: 4 mg via ORAL
  Filled 2014-10-21: qty 1

## 2014-10-21 MED ORDER — FENTANYL CITRATE 0.05 MG/ML IJ SOLN
INTRAMUSCULAR | Status: AC
  Filled 2014-10-21: qty 2

## 2014-10-21 MED ORDER — CELECOXIB 200 MG PO CAPS
200.0000 mg | ORAL_CAPSULE | Freq: Two times a day (BID) | ORAL | Status: DC
  Administered 2014-10-21 – 2014-10-22 (×2): 200 mg via ORAL
  Filled 2014-10-21 (×3): qty 1

## 2014-10-21 MED ORDER — SODIUM CHLORIDE 0.9 % IR SOLN
Status: DC | PRN
  Administered 2014-10-21: 1000 mL

## 2014-10-21 MED ORDER — METOCLOPRAMIDE HCL 5 MG/ML IJ SOLN: 5.0000 mg | Freq: Three times a day (TID) | INTRAMUSCULAR | Status: DC | PRN

## 2014-10-21 MED ORDER — BUPIVACAINE-EPINEPHRINE 0.25% -1:200000 IJ SOLN
INTRAMUSCULAR | Status: AC
  Filled 2014-10-21: qty 1

## 2014-10-21 MED ORDER — MIDAZOLAM HCL 5 MG/5ML IJ SOLN
INTRAMUSCULAR | Status: DC | PRN
  Administered 2014-10-21 (×2): 2 mg via INTRAVENOUS

## 2014-10-21 MED ORDER — ONDANSETRON HCL 4 MG/2ML IJ SOLN
INTRAMUSCULAR | Status: DC | PRN
  Administered 2014-10-21: 4 mg via INTRAVENOUS

## 2014-10-21 MED ORDER — MENTHOL 3 MG MT LOZG
1.0000 | LOZENGE | OROMUCOSAL | Status: DC | PRN
  Filled 2014-10-21: qty 9

## 2014-10-21 MED ORDER — METOCLOPRAMIDE HCL 10 MG PO TABS: 5.0000 mg | ORAL_TABLET | Freq: Three times a day (TID) | ORAL | Status: DC | PRN

## 2014-10-21 MED ORDER — SODIUM CHLORIDE 0.9 % IV SOLN
INTRAVENOUS | Status: DC
  Administered 2014-10-21 – 2014-10-22 (×2): via INTRAVENOUS
  Filled 2014-10-21 (×5): qty 1000

## 2014-10-21 MED ORDER — DOCUSATE SODIUM 100 MG PO CAPS
100.0000 mg | ORAL_CAPSULE | Freq: Two times a day (BID) | ORAL | Status: DC
  Administered 2014-10-21 – 2014-10-22 (×2): 100 mg via ORAL

## 2014-10-21 MED ORDER — OXYCODONE HCL 5 MG PO TABS
5.0000 mg | ORAL_TABLET | ORAL | Status: DC
  Administered 2014-10-21: 15 mg via ORAL
  Administered 2014-10-21: 5 mg via ORAL
  Administered 2014-10-21: 10 mg via ORAL
  Administered 2014-10-22 (×3): 15 mg via ORAL
  Filled 2014-10-21: qty 3
  Filled 2014-10-21: qty 1
  Filled 2014-10-21: qty 3
  Filled 2014-10-21: qty 2
  Filled 2014-10-21 (×2): qty 3

## 2014-10-21 MED ORDER — BUPIVACAINE-EPINEPHRINE (PF) 0.25% -1:200000 IJ SOLN
INTRAMUSCULAR | Status: DC | PRN
  Administered 2014-10-21: 30 mL

## 2014-10-21 MED ORDER — CEFAZOLIN SODIUM-DEXTROSE 2-3 GM-% IV SOLR
INTRAVENOUS | Status: AC
  Filled 2014-10-21: qty 50

## 2014-10-21 MED ORDER — THYROID 120 MG PO TABS
120.0000 mg | ORAL_TABLET | Freq: Every day | ORAL | Status: DC
  Administered 2014-10-22: 120 mg via ORAL
  Filled 2014-10-21 (×2): qty 1

## 2014-10-21 MED ORDER — HYDROMORPHONE HCL 1 MG/ML IJ SOLN
0.5000 mg | INTRAMUSCULAR | Status: DC | PRN
  Administered 2014-10-21 – 2014-10-22 (×4): 1 mg via INTRAVENOUS
  Filled 2014-10-21 (×5): qty 1

## 2014-10-21 MED ORDER — CEFAZOLIN SODIUM-DEXTROSE 2-3 GM-% IV SOLR
2.0000 g | Freq: Four times a day (QID) | INTRAVENOUS | Status: AC
  Administered 2014-10-21 – 2014-10-22 (×2): 2 g via INTRAVENOUS
  Filled 2014-10-21 (×2): qty 50

## 2014-10-21 MED ORDER — ONDANSETRON HCL 4 MG/2ML IJ SOLN
4.0000 mg | Freq: Four times a day (QID) | INTRAMUSCULAR | Status: DC | PRN
  Administered 2014-10-22: 4 mg via INTRAVENOUS
  Filled 2014-10-21: qty 2

## 2014-10-21 MED ORDER — KETOROLAC TROMETHAMINE 30 MG/ML IJ SOLN
INTRAMUSCULAR | Status: AC
  Filled 2014-10-21: qty 1

## 2014-10-21 MED ORDER — ALUM & MAG HYDROXIDE-SIMETH 200-200-20 MG/5ML PO SUSP: 30.0000 mL | ORAL | Status: DC | PRN

## 2014-10-21 MED ORDER — BUPIVACAINE HCL (PF) 0.75 % IJ SOLN
INTRAMUSCULAR | Status: DC | PRN
  Administered 2014-10-21: 1.8 mL

## 2014-10-21 MED ORDER — SODIUM CHLORIDE 0.9 % IJ SOLN
INTRAMUSCULAR | Status: DC | PRN
  Administered 2014-10-21: 30 mL

## 2014-10-21 MED ORDER — CEFAZOLIN SODIUM-DEXTROSE 2-3 GM-% IV SOLR
2.0000 g | INTRAVENOUS | Status: AC
  Administered 2014-10-21: 2 g via INTRAVENOUS

## 2014-10-21 MED ORDER — LACTATED RINGERS IV SOLN
INTRAVENOUS | Status: DC
  Administered 2014-10-21 (×2): via INTRAVENOUS
  Administered 2014-10-21: 1000 mL via INTRAVENOUS

## 2014-10-21 MED ORDER — ASPIRIN EC 325 MG PO TBEC
325.0000 mg | DELAYED_RELEASE_TABLET | Freq: Two times a day (BID) | ORAL | Status: DC
  Administered 2014-10-22: 325 mg via ORAL
  Filled 2014-10-21 (×3): qty 1

## 2014-10-21 MED ORDER — METHOCARBAMOL 500 MG PO TABS
500.0000 mg | ORAL_TABLET | Freq: Four times a day (QID) | ORAL | Status: DC | PRN
  Administered 2014-10-21 – 2014-10-22 (×3): 500 mg via ORAL
  Filled 2014-10-21 (×4): qty 1

## 2014-10-21 MED ORDER — KETOROLAC TROMETHAMINE 30 MG/ML IJ SOLN
INTRAMUSCULAR | Status: DC | PRN
  Administered 2014-10-21: 30 mg

## 2014-10-21 MED ORDER — DIPHENHYDRAMINE HCL 25 MG PO CAPS
25.0000 mg | ORAL_CAPSULE | Freq: Four times a day (QID) | ORAL | Status: DC | PRN
  Administered 2014-10-22: 25 mg via ORAL
  Filled 2014-10-21: qty 1

## 2014-10-21 MED ORDER — ONDANSETRON HCL 4 MG/2ML IJ SOLN
INTRAMUSCULAR | Status: AC
  Filled 2014-10-21: qty 2

## 2014-10-21 MED ORDER — MAGNESIUM CITRATE PO SOLN: 1.0000 | Freq: Once | ORAL | Status: AC | PRN

## 2014-10-21 MED ORDER — DEXAMETHASONE SODIUM PHOSPHATE 10 MG/ML IJ SOLN
10.0000 mg | Freq: Once | INTRAMUSCULAR | Status: DC
  Filled 2014-10-21: qty 1

## 2014-10-21 MED ORDER — POLYETHYLENE GLYCOL 3350 17 G PO PACK
17.0000 g | PACK | Freq: Two times a day (BID) | ORAL | Status: DC
  Administered 2014-10-21: 17 g via ORAL

## 2014-10-21 MED ORDER — TRANEXAMIC ACID 100 MG/ML IV SOLN
1000.0000 mg | Freq: Once | INTRAVENOUS | Status: AC
  Administered 2014-10-21: 1000 mg via INTRAVENOUS
  Filled 2014-10-21: qty 10

## 2014-10-21 MED ORDER — HYDROMORPHONE HCL 1 MG/ML IJ SOLN: 0.2500 mg | INTRAMUSCULAR | Status: DC | PRN

## 2014-10-21 MED ORDER — PHENOL 1.4 % MT LIQD
1.0000 | OROMUCOSAL | Status: DC | PRN
  Filled 2014-10-21: qty 177

## 2014-10-21 SURGICAL SUPPLY — 51 items
BAG ZIPLOCK 12X15 (MISCELLANEOUS) IMPLANT
BANDAGE ELASTIC 6 VELCRO ST LF (GAUZE/BANDAGES/DRESSINGS) ×2 IMPLANT
BANDAGE ESMARK 6X9 LF (GAUZE/BANDAGES/DRESSINGS) ×1 IMPLANT
BLADE SAW SGTL 13.0X1.19X90.0M (BLADE) ×2 IMPLANT
BNDG ESMARK 6X9 LF (GAUZE/BANDAGES/DRESSINGS) ×2
BOWL SMART MIX CTS (DISPOSABLE) ×2 IMPLANT
CAPT KNEE TOTAL 3 ATTUNE ×2 IMPLANT
CEMENT HV SMART SET (Cement) ×4 IMPLANT
CUFF TOURN SGL QUICK 34 (TOURNIQUET CUFF) ×1
CUFF TRNQT CYL 34X4X40X1 (TOURNIQUET CUFF) ×1 IMPLANT
DECANTER SPIKE VIAL GLASS SM (MISCELLANEOUS) ×2 IMPLANT
DRAPE EXTREMITY T 121X128X90 (DRAPE) ×2 IMPLANT
DRAPE POUCH INSTRU U-SHP 10X18 (DRAPES) ×2 IMPLANT
DRAPE U-SHAPE 47X51 STRL (DRAPES) ×2 IMPLANT
DRSG AQUACEL AG ADV 3.5X10 (GAUZE/BANDAGES/DRESSINGS) ×2 IMPLANT
DURAPREP 26ML APPLICATOR (WOUND CARE) ×4 IMPLANT
ELECT REM PT RETURN 9FT ADLT (ELECTROSURGICAL) ×2
ELECTRODE REM PT RTRN 9FT ADLT (ELECTROSURGICAL) ×1 IMPLANT
FACESHIELD WRAPAROUND (MASK) ×10 IMPLANT
GLOVE BIOGEL PI IND STRL 7.5 (GLOVE) ×1 IMPLANT
GLOVE BIOGEL PI IND STRL 8.5 (GLOVE) ×1 IMPLANT
GLOVE BIOGEL PI INDICATOR 7.5 (GLOVE) ×1
GLOVE BIOGEL PI INDICATOR 8.5 (GLOVE) ×1
GLOVE ECLIPSE 8.0 STRL XLNG CF (GLOVE) IMPLANT
GLOVE ORTHO TXT STRL SZ7.5 (GLOVE) IMPLANT
GLOVE SURG SS PI 7.5 STRL IVOR (GLOVE) ×4 IMPLANT
GLOVE SURG SS PI 8.0 STRL IVOR (GLOVE) ×4 IMPLANT
GOWN SPEC L3 XXLG W/TWL (GOWN DISPOSABLE) ×2 IMPLANT
GOWN STRL REUS W/TWL LRG LVL3 (GOWN DISPOSABLE) ×2 IMPLANT
HANDPIECE INTERPULSE COAX TIP (DISPOSABLE) ×1
KIT BASIN OR (CUSTOM PROCEDURE TRAY) ×2 IMPLANT
LIQUID BAND (GAUZE/BANDAGES/DRESSINGS) ×2 IMPLANT
MANIFOLD NEPTUNE II (INSTRUMENTS) ×2 IMPLANT
NDL SAFETY ECLIPSE 18X1.5 (NEEDLE) ×2 IMPLANT
NEEDLE HYPO 18GX1.5 SHARP (NEEDLE) ×2
PACK TOTAL JOINT (CUSTOM PROCEDURE TRAY) ×2 IMPLANT
POSITIONER SURGICAL ARM (MISCELLANEOUS) ×2 IMPLANT
SET HNDPC FAN SPRY TIP SCT (DISPOSABLE) ×1 IMPLANT
SET PAD KNEE POSITIONER (MISCELLANEOUS) ×2 IMPLANT
SUCTION FRAZIER 12FR DISP (SUCTIONS) ×2 IMPLANT
SUT MNCRL AB 4-0 PS2 18 (SUTURE) ×2 IMPLANT
SUT VIC AB 1 CT1 36 (SUTURE) ×2 IMPLANT
SUT VIC AB 2-0 CT1 27 (SUTURE) ×3
SUT VIC AB 2-0 CT1 TAPERPNT 27 (SUTURE) ×3 IMPLANT
SUT VLOC 180 0 24IN GS25 (SUTURE) ×2 IMPLANT
TOWEL OR 17X26 10 PK STRL BLUE (TOWEL DISPOSABLE) ×2 IMPLANT
TOWEL OR NON WOVEN STRL DISP B (DISPOSABLE) IMPLANT
TRAY FOLEY CATH 14FRSI W/METER (CATHETERS) IMPLANT
TRAY FOLEY METER SIL LF 16FR (CATHETERS) ×2 IMPLANT
WATER STERILE IRR 1500ML POUR (IV SOLUTION) ×2 IMPLANT
WRAP KNEE MAXI GEL POST OP (GAUZE/BANDAGES/DRESSINGS) ×2 IMPLANT

## 2014-10-21 NOTE — Interval H&P Note (Signed)
History and Physical Interval Note:  10/21/2014 11:19 AM  [name]  has presented today for surgery, with the diagnosis of left knee osteoarthritis  The various methods of treatment have been discussed with the patient and family. After consideration of risks, benefits and other options for treatment, the patient has consented to  Procedure(s): LEFT TOTAL KNEE ARTHROPLASTY (Left) as a surgical intervention .  The patient's history has been reviewed, patient examined, no change in status, stable for surgery.  I have reviewed the patient's chart and labs.  Questions were answered to the patient's satisfaction.     Paula Jacobs

## 2014-10-21 NOTE — Anesthesia Postprocedure Evaluation (Signed)
Anesthesia Post Note  Patient: Paula Jacobs  Procedure(s) Performed: Procedure(s) (LRB): LEFT TOTAL KNEE ARTHROPLASTY (Left)  Anesthesia type: Spinal  Patient location: PACU  Post pain: Pain level controlled  Post assessment: Post-op Vital signs reviewed  Last Vitals: BP 117/67 mmHg  Pulse 67  Temp(Src) 36.5 C (Oral)  Resp 16  Ht 5\' 2"  (1.575 m)  Wt 220 lb (99.791 kg)  BMI 40.23 kg/m2  SpO2 97%  Post vital signs: Reviewed  Level of consciousness: sedated  Complications: No apparent anesthesia complications

## 2014-10-21 NOTE — Transfer of Care (Signed)
Immediate Anesthesia Transfer of Care Note  Patient: Paula Jacobs  Procedure(s) Performed: Procedure(s): LEFT TOTAL KNEE ARTHROPLASTY (Left)  Patient Location: PACU  Anesthesia Type:Spinal  Level of Consciousness: sedated  Airway & Oxygen Therapy: Patient Spontanous Breathing and Patient connected to face mask oxygen  Post-op Assessment: Report given to RN and Post -op Vital signs reviewed and stable  Post vital signs: Reviewed and stable  Last Vitals:  Filed Vitals:   10/21/14 0936  BP: 132/78  Pulse: 81  Temp: 36.6 C  Resp: 18    Complications: No apparent anesthesia complications

## 2014-10-21 NOTE — Anesthesia Procedure Notes (Signed)
Spinal Patient location during procedure: OR Start time: 10/21/2014 12:20 PM End time: 10/21/2014 12:25 PM Staffing Resident/CRNA: Harle Stanford R Performed by: resident/CRNA  Preanesthetic Checklist Completed: patient identified, site marked, surgical consent, pre-op evaluation, timeout performed, IV checked, risks and benefits discussed and monitors and equipment checked Spinal Block Patient position: sitting Prep: Betadine Patient monitoring: heart rate, cardiac monitor, continuous pulse ox and blood pressure Approach: midline Location: L3-4 Injection technique: single-shot Needle Needle type: Spinocan  Needle gauge: 22 G Needle length: 9 cm Needle insertion depth: 7 cm Assessment Sensory level: T6

## 2014-10-21 NOTE — Progress Notes (Signed)
Utilization review completed.  

## 2014-10-21 NOTE — Progress Notes (Signed)
CSW met with pt / family this afternoon to assist with d/c planning. Pt is interested in CIR vs home. Pt is  Declining  SNF placement at this time. " If I'm unable to go to in pt. rehab I would prefer to go home. I have my husband and two grown children living with me. I have a very good support system. "  CSW is available to assist with SNF placement if pt changes her mind. Will review PT recommendations and assist with d/c planning as needed.  Werner Lean LCSW [phone]

## 2014-10-21 NOTE — Anesthesia Preprocedure Evaluation (Signed)
Anesthesia Evaluation  Patient identified by MRN, date of birth, ID band Patient awake    Reviewed: Allergy & Precautions, NPO status , Patient's Chart, lab work & pertinent test results  History of Anesthesia Complications (+) PONV and history of anesthetic complications  Airway Mallampati: II  TM Distance: >3 FB Neck ROM: Full    Dental no notable dental hx.    Pulmonary neg pulmonary ROS, former smoker,  breath sounds clear to auscultation  Pulmonary exam normal       Cardiovascular hypertension, Pt. on medications Rhythm:Regular Rate:Normal     Neuro/Psych PSYCHIATRIC DISORDERS Depression negative neurological ROS     GI/Hepatic negative GI ROS, Neg liver ROS,   Endo/Other  Hypothyroidism Morbid obesity  Renal/GU negative Renal ROS     Musculoskeletal  (+) Arthritis -,   Abdominal (+) + obese,   Peds  Hematology negative hematology ROS (+) anemia ,   Anesthesia Other Findings   Reproductive/Obstetrics negative OB ROS                             Anesthesia Physical Anesthesia Plan  ASA: III  Anesthesia Plan: Spinal   Post-op Pain Management:    Induction: Intravenous  Airway Management Planned:   Additional Equipment:   Intra-op Plan:   Post-operative Plan:   Informed Consent: I have reviewed the patients History and Physical, chart, labs and discussed the procedure including the risks, benefits and alternatives for the proposed anesthesia with the patient or authorized representative who has indicated his/her understanding and acceptance.   Dental advisory given  Plan Discussed with: CRNA  Anesthesia Plan Comments:         Anesthesia Quick Evaluation

## 2014-10-21 NOTE — Op Note (Signed)
NAME:  Paula Jacobs RECORD NO.:  [id number]                             FACILITY:  [facility]      PHYSICIAN:  Pietro Cassis. Alvan Dame, M.D.  DATE OF BIRTH:  Aug 12, 1961      DATE OF PROCEDURE:  10/21/2014                                     OPERATIVE REPORT         PREOPERATIVE DIAGNOSIS:  Left knee osteoarthritis.      POSTOPERATIVE DIAGNOSIS:  Left knee osteoarthritis.      FINDINGS:  The patient was noted to have complete loss of cartilage and   bone-on-bone arthritis with associated osteophytes in the lateral and patellofemoral compartments of   the knee with a significant synovitis and associated effusion.      PROCEDURE:  Left total knee replacement.      COMPONENTS USED:  DePuy Attune rotating platform posterior stabilized knee   system, a size 3 femur, 4 tibia, size 8 mm AOX PS insert, and 32 patellar   button.      SURGEON:  Pietro Cassis. Alvan Dame, M.D.      ASSISTANT:  Danae Orleans, PA-C.      ANESTHESIA:  Spinal.      SPECIMENS:  None.      COMPLICATION:  None.      DRAINS:  None.  EBL: <100cc      TOURNIQUET TIME:   Total Tourniquet Time Documented: Thigh (Left) - 33 minutes Total: Thigh (Left) - 33 minutes  .      The patient was stable to the recovery room.      INDICATION FOR PROCEDURE:  Paula Jacobs is a 54 y.o. female patient of   mine.  The patient had been seen, evaluated, and treated conservatively in the   office with medication, activity modification, and injections.  The patient had   radiographic changes of bone-on-bone arthritis with endplate sclerosis and osteophytes noted.      The patient failed conservative measures including medication, injections, and activity modification, and at this Paula was ready for more definitive measures.   Based on the radiographic changes and failed conservative measures, the patient   decided to proceed with total knee replacement.  Risks of infection,   DVT, component failure,  need for revision surgery, postop course, and   expectations were all   discussed and reviewed.  Consent was obtained for benefit of pain   relief.      PROCEDURE IN DETAIL:  The patient was brought to the operative theater.   Once adequate anesthesia, preoperative antibiotics, 2 gm of Ancef, 1 gm of Tranexamic Acid, and 10mg  of Decadron administered, the patient was positioned supine with the left thigh tourniquet placed.  The  left lower extremity was prepped and draped in sterile fashion.  A time-   out was performed identifying the patient, planned procedure, and   extremity.      The left lower extremity was placed in the [facility] leg holder.  The leg was   exsanguinated, tourniquet elevated to 250 mmHg.  A midline incision was   made followed by  median parapatellar arthrotomy.  Following initial   exposure, attention was first directed to the patella.  Precut   measurement was noted to be 20 mm.  I resected down to 14 mm and used a   32 patellar button to restore patellar height as well as cover the cut   surface.      The lug holes were drilled and a metal shim was placed to protect the   patella from retractors and saw blades.      At this Paula, attention was now directed to the femur.  The femoral   canal was opened with a drill, irrigated to try to prevent fat emboli.  An   intramedullary rod was passed at 3 degrees valgus, 8 mm of bone was   resected off the distal femur due to pre-operative hyper-extension.  Following this resection, the tibia was   subluxated anteriorly.  Using the extramedullary guide, 4 mm of bone was resected off   the proximal medial tibia.  We confirmed the gap would be   stable medially and laterally with a size 6 mm insert as well as confirmed   the cut was perpendicular in the coronal plane, checking with an alignment rod.      Once this was done, I sized the femur to be a size 3 in the anterior-   posterior dimension, chose a standard component  based on medial and   lateral dimension.  The size 3 rotation block was then pinned in   position anterior referenced using the C-clamp to set rotation.  The   anterior, posterior, and  chamfer cuts were made without difficulty nor   notching making certain that I was along the anterior cortex to help   with flexion gap stability.      The final box cut was made off the lateral aspect of distal femur.      At this Paula, the tibia was sized to be a size 4, the size 4 tray was   then pinned in position through the medial third of the tubercle,   drilled, and keel punched.  Trial reduction was now carried with a 3 femur,  4 tibia, a 7 then 8 mm insert, and the 32 patella botton.  The knee was brought to   extension, full extension with good flexion stability with the patella   tracking through the trochlea without application of pressure.  Given   all these findings, the trial components removed.  Final components were   opened and cement was mixed.  The knee was irrigated with normal saline   solution and pulse lavage.  The synovial lining was   then injected with 30cc of 0.25% Marcaine with epinephrine and 1 cc of Toradol plus 30cc of NS for a   total of 61 cc.      The knee was irrigated.  Final implants were then cemented onto clean and   dried cut surfaces of bone with the knee brought to extension with a 8 mm trial insert.      Once the cement had fully cured, the excess cement was removed   throughout the knee.  I confirmed I was satisfied with the range of   motion and stability, and the final 8 mm AOX PS insert was chosen.  It was   placed into the knee.      The tourniquet had been let down at 33 minutes.  No significant   hemostasis required.  The  extensor mechanism was then reapproximated using #1 Vicryl and # 0 V-lock sutures with the knee   in flexion.  The   remaining wound was closed with 2-0 Vicryl and running 4-0 Monocryl.   The knee was cleaned, dried, dressed  sterilely using Dermabond and   Aquacel dressing.  The patient was then   brought to recovery room in stable condition, tolerating the procedure   well.   Please note that Physician Assistant, Danae Orleans, PA-C, was present for the entirety of the case, and was utilized for pre-operative positioning, peri-operative retractor management, general facilitation of the procedure.  He was also utilized for primary wound closure at the end of the case.              Pietro Cassis Alvan Dame, M.D.    10/21/2014 1:59 PM

## 2014-10-22 ENCOUNTER — Encounter (HOSPITAL_COMMUNITY): Payer: Self-pay | Admitting: Orthopedic Surgery

## 2014-10-22 LAB — CBC
HEMATOCRIT: 32.7 % — AB (ref 36.0–46.0)
HEMOGLOBIN: 10.1 g/dL — AB (ref 12.0–15.0)
MCH: 26.9 pg (ref 26.0–34.0)
MCHC: 30.9 g/dL (ref 30.0–36.0)
MCV: 87 fL (ref 78.0–100.0)
Platelets: 367 10*3/uL (ref 150–400)
RBC: 3.76 MIL/uL — AB (ref 3.87–5.11)
RDW: 14 % (ref 11.5–15.5)
WBC: 14.7 10*3/uL — ABNORMAL HIGH (ref 4.0–10.5)

## 2014-10-22 LAB — BASIC METABOLIC PANEL
Anion gap: 7 (ref 5–15)
BUN: 14 mg/dL (ref 6–23)
CO2: 27 mmol/L (ref 19–32)
CREATININE: 0.71 mg/dL (ref 0.50–1.10)
Calcium: 8.5 mg/dL (ref 8.4–10.5)
Chloride: 105 mmol/L (ref 96–112)
Glucose, Bld: 140 mg/dL — ABNORMAL HIGH (ref 70–99)
POTASSIUM: 4.2 mmol/L (ref 3.5–5.1)
Sodium: 139 mmol/L (ref 135–145)

## 2014-10-22 MED ORDER — METHOCARBAMOL 500 MG PO TABS: 500.0000 mg | ORAL_TABLET | Freq: Four times a day (QID) | ORAL | Status: DC | PRN

## 2014-10-22 MED ORDER — POLYETHYLENE GLYCOL 3350 17 G PO PACK: 17.0000 g | PACK | Freq: Two times a day (BID) | ORAL | Status: DC

## 2014-10-22 MED ORDER — FERROUS SULFATE 325 (65 FE) MG PO TABS: 325.0000 mg | ORAL_TABLET | Freq: Three times a day (TID) | ORAL | Status: DC

## 2014-10-22 MED ORDER — OXYCODONE HCL 5 MG PO TABS: 5.0000 mg | ORAL_TABLET | ORAL | Status: DC | PRN

## 2014-10-22 MED ORDER — DOCUSATE SODIUM 100 MG PO CAPS: 100.0000 mg | ORAL_CAPSULE | Freq: Two times a day (BID) | ORAL | Status: DC

## 2014-10-22 MED ORDER — ASPIRIN 325 MG PO TBEC: 325.0000 mg | DELAYED_RELEASE_TABLET | Freq: Two times a day (BID) | ORAL | Status: AC

## 2014-10-22 NOTE — Evaluation (Signed)
Physical Therapy Evaluation Patient Details Name: KALAYNA NOY MRN: [id number] DOB: 07-06-1961 Today's Date: 10/22/2014   History of Present Illness  LTKR  Clinical Impression  Patient  tolerating activity well. plans DC today. Patient will benefit from PT to address problems listed in note below.    Follow Up Recommendations Home health PT;Supervision/Assistance - 24 hour    Equipment Recommendations  Rolling walker with 5" wheels;3in1 (PT)    Recommendations for Other Services       Precautions / Restrictions Precautions Precautions: Knee      Mobility  Bed Mobility Overal bed mobility: Needs Assistance Bed Mobility: Supine to Sit     Supine to sit: Min assist     General bed mobility comments: cues for sequence, support LLE  Transfers Overall transfer level: Needs assistance Equipment used: Rolling walker (2 wheeled) Transfers: Sit to/from Stand Sit to Stand: Supervision         General transfer comment: cues for hand and LLE position  Ambulation/Gait Ambulation/Gait assistance: Min assist Ambulation Distance (Feet): 125 Feet Assistive device: Rolling walker (2 wheeled) Gait Pattern/deviations: Step-to pattern;Step-through pattern;Antalgic     General Gait Details: cues for sequence and step length and posture  Stairs            Wheelchair Mobility    Modified Rankin (Stroke Patients Only)       Balance                                             Pertinent Vitals/Pain Pain Assessment: 0-10 Pain Score: 4  Pain Location: L knee Pain Descriptors / Indicators: Sore;Discomfort Pain Intervention(s): Monitored during session;Premedicated before session;Ice applied    Home Living Family/patient expects to be discharged to:: Private residence Living Arrangements: Spouse/significant other Available Help at Discharge: Family Type of Home: House Home Access: Stairs to enter Entrance Stairs-Rails: None Entrance  Stairs-Number of Steps: 15   Home Equipment: None      Prior Function Level of Independence: Independent               Hand Dominance        Extremity/Trunk Assessment   Upper Extremity Assessment: Overall WFL for tasks assessed           Lower Extremity Assessment: LLE deficits/detail   LLE Deficits / Details: knee flexion 70 in sitting, performs SLR.     Communication   Communication: No difficulties  Cognition Arousal/Alertness: Awake/alert Behavior During Therapy: WFL for tasks assessed/performed Overall Cognitive Status: Within Functional Limits for tasks assessed                      General Comments      Exercises Total Joint Exercises Quad Sets: AROM;Left;10 reps Towel Squeeze: AROM;Left;10 reps Short Arc Quad: AROM;Left;10 reps Heel Slides: AROM;Left;10 reps Hip ABduction/ADduction: AROM;Left;10 reps Straight Leg Raises: AAROM;Left;10 reps      Assessment/Plan    PT Assessment Patient needs continued PT services  PT Diagnosis Difficulty walking;Acute pain   PT Problem List Decreased strength;Decreased range of motion;Decreased activity tolerance;Decreased mobility;Decreased knowledge of precautions;Decreased safety awareness;Decreased knowledge of use of DME;Pain  PT Treatment Interventions DME instruction;Gait training;Stair training;Functional mobility training;Therapeutic activities;Therapeutic exercise;Patient/family education   PT Goals (Current goals can be found in the Care Plan section) Acute Rehab PT Goals Patient Stated Goal: to go home, work hard. PT Goal  Formulation: With patient Time For Goal Achievement: 10/23/14 Potential to Achieve Goals: Good    Frequency 7X/week   Barriers to discharge        Co-evaluation               End of Session   Activity Tolerance: Patient tolerated treatment well Patient left: in chair;with call bell/phone within reach Nurse Communication: Mobility status          Time: 1026-1052 PT Time Calculation (min) (ACUTE ONLY): 26 min   Charges:   PT Evaluation $Initial PT Evaluation Tier I: 1 Procedure PT Treatments $Gait Training: 8-22 mins   PT G Codes:        Claretha Cooper 10/22/2014, 12:55 PM

## 2014-10-22 NOTE — Progress Notes (Signed)
     Subjective: 1 Day Post-Op Procedure(s) (LRB): LEFT TOTAL KNEE ARTHROPLASTY (Left)   Patient reports pain as mild, pain controlled. No events throughout the night. Ready to be discharged home.  Objective:   VITALS:   Filed Vitals:   10/22/14 0612  BP: 112/82  Pulse: 63  Temp: 98.6 F (37 C)  Resp: 16    Dorsiflexion/Plantar flexion intact Incision: dressing C/D/I No cellulitis present Compartment soft  LABS  Recent Labs  10/22/14 0523  HGB 10.1*  HCT 32.7*  WBC 14.7*  PLT 367     Recent Labs  10/22/14 0523  NA 139  K 4.2  BUN 14  CREATININE 0.71  GLUCOSE 140*     Assessment/Plan: 1 Day Post-Op Procedure(s) (LRB): LEFT TOTAL KNEE ARTHROPLASTY (Left) Foley cath d/c'ed Advance diet Up with therapy D/C IV fluids Discharge home with home health  Follow up in 2 weeks at [facility]. Follow up with OLIN,Chrysten Woulfe D in 2 weeks.  Contact information:  [facility] [address], [place] [address] [phone]    Morbid Obesity (BMI >40)  Estimated body mass index is 40.23 kg/(m^2) as calculated from the following:   Height as of this encounter: 5\' 2"  (1.575 m).   Weight as of this encounter: 99.791 kg (220 lb). Patient also counseled that weight may inhibit the healing process Patient counseled that losing weight will help with future health issues        Paula Jacobs. Paula Jacobs   PAC  10/22/2014, 9:32 AM

## 2014-10-22 NOTE — Care Management Note (Signed)
    Page 1 of 2   10/22/2014     11:18:22 AM CARE MANAGEMENT NOTE 10/22/2014  Patient:  ZSOFIA, PROUT   Account Number:  [id number]  Date Initiated:  10/22/2014  Documentation initiated by:  [facility]  Subjective/Objective Assessment:   adm: LEFT TOTAL KNEE ARTHROPLASTY (Left)     Action/Plan:   discharge plannig   Anticipated DC Date:  10/22/2014   Anticipated DC Plan:  [place]  CM consult      [facility] Choice  HOME HEALTH   Choice offered to / List presented to:  C-1 Patient   DME arranged  3-N-1  Vassie Moselle      DME agency  [employer] arranged  [place]   Status of service:  Completed, signed off Medicare Important Message given?   (If response is "NO", the following Medicare IM given date fields will be blank) Date Medicare IM given:   Medicare IM given by:   Date Additional Medicare IM given:   Additional Medicare IM given by:    Discharge Disposition:  [place]  Per UR Regulation:    If discussed at Long Length of Stay Meetings, dates discussed:    Comments:  10/22/14 07:50 Cm met with pt in room to offer choice of home health agency.  Pt chooses [facility] to render HHPT.  Address and contact information verified by pt.  CM called [facility] DME rep, Lecretia to please deliver the rolling walker and 3n1 to room prior to discharge.  Referral given to [employer], Tim.  NO other Cm needs were communicated. Mariane Masters, BSN, CM [phone].

## 2014-10-22 NOTE — Progress Notes (Signed)
Physical Therapy Treatment Patient Details Name: Paula Jacobs MRN: [id number] DOB: December 29, 1960 Today's Date: 10/22/2014    History of Present Illness LTKR    PT Comments    Patient has practiced steps, has a broken rail so used crutches.  Can review with spouse as needed.   Follow Up Recommendations  Home health PT;Supervision/Assistance - 24 hour     Equipment Recommendations  Rolling walker with 5" wheels;3in1 (PT)    Recommendations for Other Services       Precautions / Restrictions Precautions Precautions: Knee    Mobility  Bed Mobility Overal bed mobility: Needs Assistance Bed Mobility: Supine to Sit     Supine to sit: Min assist     General bed mobility comments: cues for sequence, support LLE  Transfers Overall transfer level: Needs assistance Equipment used: Rolling walker (2 wheeled) Transfers: Sit to/from Stand Sit to Stand: Supervision         General transfer comment: cues for hand and LLE position  Ambulation/Gait Ambulation/Gait assistance: Supervision Ambulation Distance (Feet): 100 Feet Assistive device: Rolling walker (2 wheeled) Gait Pattern/deviations: Step-to pattern;Step-through pattern     General Gait Details: cues for sequence and step length and posture   Stairs Stairs: Yes     Number of Stairs: 4 General stair comments: up  forwards with 2 crutches and down with 1 crutch and rail, min assist and cues for sequence and safety. left handout on steps for pt to review with spouse and page me if needed to go over with spouse.  Wheelchair Mobility    Modified Rankin (Stroke Patients Only)       Balance                                    Cognition Arousal/Alertness: Awake/alert Behavior During Therapy: WFL for tasks assessed/performed Overall Cognitive Status: Within Functional Limits for tasks assessed                      Exercises Total Joint Exercises Quad Sets: AROM;Left;10  reps Towel Squeeze: AROM;Left;10 reps Short Arc Quad: AROM;Left;10 reps Heel Slides: AROM;Left;10 reps Hip ABduction/ADduction: AROM;Left;10 reps Straight Leg Raises: AAROM;Left;10 reps    General Comments        Pertinent Vitals/Pain Pain Assessment: 0-10 Pain Score: 4  Pain Location: L knee Pain Descriptors / Indicators: Sore;Discomfort Pain Intervention(s): Monitored during session;Premedicated before session;Ice applied    Home Living Family/patient expects to be discharged to:: Private residence Living Arrangements: Spouse/significant other Available Help at Discharge: Family Type of Home: House Home Access: Stairs to enter Entrance Stairs-Rails: None   Home Equipment: None      Prior Function Level of Independence: Independent          PT Goals (current goals can now be found in the care plan section) Acute Rehab PT Goals Patient Stated Goal: to go home, work hard. PT Goal Formulation: With patient Time For Goal Achievement: 10/23/14 Potential to Achieve Goals: Good Progress towards PT goals: Progressing toward goals    Frequency  7X/week    PT Plan Current plan remains appropriate    Co-evaluation             End of Session   Activity Tolerance: Patient tolerated treatment well Patient left: in chair;with call bell/phone within reach     Time: 1120-1137 PT Time Calculation (min) (ACUTE ONLY): 17 min  Charges:  $Gait  Training: 8-22 mins                    G Codes:      Claretha Cooper 10/22/2014, 1:02 PM Tresa Endo PT [phone]

## 2014-10-22 NOTE — Evaluation (Signed)
Occupational Therapy Evaluation Patient Details Name: DELONA CLASBY MRN: [id number] DOB: [birth year] Today's Date: 10/22/2014    History of Present Illness LTKR   Clinical Impression   Education complete s/p TKR    Follow Up Recommendations  No OT follow up    Equipment Recommendations  3 in 1 bedside comode    Recommendations for Other Services       Precautions / Restrictions Precautions Precautions: Knee      Mobility Bed Mobility               General bed mobility comments: pt in chair  Transfers Overall transfer level: Needs assistance Equipment used: Rolling walker (2 wheeled) Transfers: Sit to/from Stand Sit to Stand: Modified independent (Device/Increase time)              Balance                                            ADL Overall ADL's : At baseline                                       General ADL Comments: Educated on ADL activity s/p TKR.  Pt able to perform with increased time and VC.  Education provided     Environmental education officer      Pertinent Vitals/Pain Pain Assessment: 0-10 Pain Score: 4  Pain Descriptors / Indicators: Sore Pain Intervention(s): Ice applied;Limited activity within patient's tolerance;Monitored during session     Hand Dominance     Extremity/Trunk Assessment Upper Extremity Assessment Upper Extremity Assessment: Overall WFL for tasks assessed           Communication Communication Communication: No difficulties   Cognition Arousal/Alertness: Awake/alert Behavior During Therapy: WFL for tasks assessed/performed Overall Cognitive Status: Within Functional Limits for tasks assessed                     General Comments       Exercises       Shoulder Instructions      Home Living Family/patient expects to be discharged to:: Private residence Living Arrangements: Alone Available Help at Discharge:  Family Type of Home: House             Bathroom Shower/Tub: Tub/shower unit Shower/tub characteristics: Door       Home Equipment: None          Prior Functioning/Environment Level of Independence: Independent             OT Diagnosis:     OT Problem List:     OT Treatment/Interventions:      OT Goals(Current goals can be found in the care plan section)    OT Frequency:     Barriers to D/C:            Co-evaluation              End of Session Nurse Communication: Mobility status  Activity Tolerance: Patient tolerated treatment well Patient left: in chair;with call bell/phone within reach   Time: 1150-1213 OT Time Calculation (min): 23 min Charges:  OT Evaluation $Initial OT Evaluation Tier I: 1  Procedure OT Treatments $Self Care/Home Management : 8-22 mins G-Codes:    Betsy Pries 10-29-2014, 12:22 PM

## 2014-10-28 ENCOUNTER — Ambulatory Visit: Payer: BC Managed Care – PPO | Admitting: Family

## 2014-10-29 NOTE — Discharge Summary (Signed)
Physician Discharge Summary  Patient ID: Paula Jacobs MRN: [id number] DOB/AGE: 1961/01/03 54 y.o.  Admit date: 10/21/2014 Discharge date: 10/22/2014   Procedures:  Procedure(s) (LRB): LEFT TOTAL KNEE ARTHROPLASTY (Left)  Attending Physician:  Dr. [name]   Admission Diagnoses:   Left knee primary OA / pain.  Discharge Diagnoses:  Principal Problem:   S/P left TKA Active Problems:   S/P knee replacement   Morbid obesity  Past Medical History  Diagnosis Date  . HYPOTHYROIDISM 10/17/2007  . ANEMIA-NOS 04/23/2008  . DEPRESSION 04/23/2008  . Palpitations     freq at night  . Osteoarthritis   . Hypertension   . Tubulovillous adenoma of colon   . Hyperplastic colon polyp     x2  . RA (rheumatoid arthritis)     "problems in feet, hands, and knees"  . DJD (degenerative joint disease)   . PONV (postoperative nausea and vomiting)     only after 1979 surgery    HPI:    Paula Jacobs, 54 y.o. female, has a history of pain and functional disability in the left knee due to arthritis and has failed non-surgical conservative treatments for greater than 12 weeks to include NSAID's and/or analgesics, corticosteriod injections, viscosupplementation injections, use of assistive devices and activity modification. Onset of symptoms was gradual, starting September 18, 2013 with gradually worsening course since that time. The patient noted prior procedures on the knee to include arthroscopy and menisectomy on the left knee(s). Patient currently rates pain in the left knee(s) at 8 out of 10 with activity. Patient has night pain, worsening of pain with activity and weight bearing, pain that interferes with activities of daily living, pain with passive range of motion, crepitus and joint swelling. Patient has evidence of periarticular osteophytes and joint space narrowing by imaging studies. There is no active infection. Risks, benefits and expectations were discussed with the patient.  Risks including but not limited to the risk of anesthesia, blood clots, nerve damage, blood vessel damage, failure of the prosthesis, infection and up to and including death. Patient understand the risks, benefits and expectations and wishes to proceed with surgery.   PCP: Donia Ast, FNP   Discharged Condition: good  Hospital Course:  Patient underwent the above stated procedure on 10/21/2014. Patient tolerated the procedure well and brought to the recovery room in good condition and subsequently to the floor.  POD #1 BP: 112/82 ; Pulse: 63 ; Temp: 98.6 F (37 C) ; Resp: 16 Patient reports pain as mild, pain controlled. No events throughout the night. Ready to be discharged home. Dorsiflexion/plantar flexion intact, incision: dressing C/D/I, no cellulitis present and compartment soft.   LABS  Basename    HGB  10.1  HCT  32.7    Discharge Exam: General appearance: alert, cooperative and no distress Extremities: Homans sign is negative, no sign of DVT, no edema, redness or tenderness in the calves or thighs and no ulcers, gangrene or trophic changes  Disposition: Home with follow up in 2 weeks   Follow-up Information    Follow up with [name], MD. Schedule an appointment as soon as possible for a visit in 2 weeks.   Specialty:  Orthopedic Surgery   Contact information:   [address] [place] [address] [phone]       Follow up with [facility].   Why:  rolling walker and 3n1 (commode)   Contact information:   [address] [facility]  [place] [address] [phone]       Follow up with [facility].   Why:  home health physical therapy   Contact information:   [place] [address] [phone]       Discharge Instructions    Call MD / Call 911    Complete by:  As directed   If you experience chest pain or shortness of breath, CALL 911 and be transported to the hospital  emergency room.  If you develope a fever above 101 F, pus (white drainage) or increased drainage or redness at the wound, or calf pain, call your surgeon's office.     Change dressing    Complete by:  As directed   Maintain surgical dressing until follow up in the clinic. If the edges start to pull up, may reinforce with tape. If the dressing is no longer working, may remove and cover with gauze and tape, but must keep the area dry and clean.  Call with any questions or concerns.     Constipation Prevention    Complete by:  As directed   Drink plenty of fluids.  Prune juice may be helpful.  You may use a stool softener, such as Colace (over the counter) 100 mg twice a day.  Use MiraLax (over the counter) for constipation as needed.     Diet - low sodium heart healthy    Complete by:  As directed      Discharge instructions    Complete by:  As directed   Maintain surgical dressing until follow up in the clinic. If the edges start to pull up, may reinforce with tape. If the dressing is no longer working, may remove and cover with gauze and tape, but must keep the area dry and clean.  Follow up in 2 weeks at [facility]. Call with any questions or concerns.     Increase activity slowly as tolerated    Complete by:  As directed      TED hose    Complete by:  As directed   Use stockings (TED hose) for 2 weeks on both leg(s).  You may remove them at night for sleeping.     Weight bearing as tolerated    Complete by:  As directed   Laterality:  left  Extremity:  Lower             Medication List    STOP taking these medications        cephALEXin 500 MG capsule  Commonly known as:  KEFLEX     ibuprofen 200 MG tablet  Commonly known as:  ADVIL,MOTRIN      TAKE these medications        amLODipine-benazepril 5-10 MG per capsule  Commonly known as:  LOTREL  take 1 capsule by mouth once daily     ARMOUR THYROID 120 MG tablet  Generic drug:  thyroid  take 1 tablet by mouth  once daily     aspirin 325 MG EC tablet  Take 1 tablet (325 mg total) by mouth 2 (two) times daily.     docusate sodium 100 MG capsule  Commonly known as:  COLACE  Take 1 capsule (100 mg total) by mouth 2 (two) times daily.     ferrous sulfate 325 (65 FE) MG tablet  Take 1 tablet (325 mg total) by mouth 3 (three) times daily after meals.     furosemide 20 MG tablet  Commonly known as:  LASIX  Take 1 tablet (20 mg total) by mouth daily as needed.     methocarbamol 500 MG tablet  Commonly known as:  ROBAXIN  Take 1 tablet (500 mg total) by mouth every 6 (six) hours as needed for muscle spasms.     omeprazole 40 MG capsule  Commonly known as:  PRILOSEC  Take 1 capsule (40 mg total) by mouth daily.     oxyCODONE 5 MG immediate release tablet  Commonly known as:  Oxy IR/ROXICODONE  Take 1-3 tablets (5-15 mg total) by mouth every 4 (four) hours as needed for severe pain.     polyethylene glycol packet  Commonly known as:  MIRALAX / GLYCOLAX  Take 17 g by mouth 2 (two) times daily.     potassium chloride SA 20 MEQ tablet  Commonly known as:  K-DUR,KLOR-CON  Take 1 tablet (20 mEq total) by mouth daily.     traZODone 50 MG tablet  Commonly known as:  DESYREL  Take 0.5-1 tablets (25-50 mg total) by mouth at bedtime as needed for sleep.         Signed: [place] P[place]. Theophile Harvie   PA-C  10/29/2014, 1:03 PM

## 2014-12-30 ENCOUNTER — Ambulatory Visit (INDEPENDENT_AMBULATORY_CARE_PROVIDER_SITE_OTHER): Payer: BLUE CROSS/BLUE SHIELD | Admitting: Family Medicine

## 2014-12-30 DIAGNOSIS — R69 Illness, unspecified: Secondary | ICD-10-CM

## 2014-12-30 NOTE — Progress Notes (Signed)
NO SHOW

## 2015-01-26 ENCOUNTER — Telehealth: Payer: Self-pay | Admitting: Family Medicine

## 2015-01-26 DIAGNOSIS — Z96652 Presence of left artificial knee joint: Secondary | ICD-10-CM

## 2015-01-26 NOTE — Telephone Encounter (Signed)
Patient informed of the message below and she is aware the referral was placed.

## 2015-01-26 NOTE — Telephone Encounter (Signed)
Pt would like a referral to see Dr Rush Farmer at [facility] for a second opinion for her left knee pain status post total knee in Feb.

## 2015-01-26 NOTE — Telephone Encounter (Signed)
It appears she recently had a surgery with Dr. Alvan Dame, did she follow up there? If, she did but still wants referral, ok to place, but the other ortho office may require review of ortho records before they see her. Just a heads up.

## 2015-01-26 NOTE — Telephone Encounter (Signed)
Yes - she would like a second opinion.

## 2015-01-29 ENCOUNTER — Ambulatory Visit (INDEPENDENT_AMBULATORY_CARE_PROVIDER_SITE_OTHER): Payer: BLUE CROSS/BLUE SHIELD | Admitting: Family Medicine

## 2015-01-29 ENCOUNTER — Encounter: Payer: Self-pay | Admitting: Family Medicine

## 2015-01-29 VITALS — BP 124/90 | HR 104 | Temp 98.2°F | Ht 62.0 in | Wt 200.5 lb

## 2015-01-29 DIAGNOSIS — Z96652 Presence of left artificial knee joint: Secondary | ICD-10-CM | POA: Diagnosis not present

## 2015-01-29 DIAGNOSIS — M069 Rheumatoid arthritis, unspecified: Secondary | ICD-10-CM

## 2015-01-29 DIAGNOSIS — G8929 Other chronic pain: Secondary | ICD-10-CM

## 2015-01-29 DIAGNOSIS — F39 Unspecified mood [affective] disorder: Secondary | ICD-10-CM | POA: Diagnosis not present

## 2015-01-29 DIAGNOSIS — I10 Essential (primary) hypertension: Secondary | ICD-10-CM

## 2015-01-29 DIAGNOSIS — E039 Hypothyroidism, unspecified: Secondary | ICD-10-CM

## 2015-01-29 DIAGNOSIS — G894 Chronic pain syndrome: Secondary | ICD-10-CM

## 2015-01-29 LAB — TSH: TSH: 4.36 u[IU]/mL (ref 0.35–4.50)

## 2015-01-29 MED ORDER — DULOXETINE HCL 30 MG PO CPEP: 60.0000 mg | ORAL_CAPSULE | Freq: Every day | ORAL | Status: DC

## 2015-01-29 MED ORDER — AMLODIPINE BESY-BENAZEPRIL HCL 5-10 MG PO CAPS: 1.0000 | ORAL_CAPSULE | Freq: Every day | ORAL | Status: DC

## 2015-01-29 NOTE — Progress Notes (Signed)
HPI:  Paula Jacobs is a 54 yo prior patient of Dr. Leanne Chang and Roxy Cedar with a PMH of RA, HTN, GERD, hypothyroidism and Depression, without a PCP currently, here for an acute visit for:  Depressed mood: -diagnosed: reports intermittent her whole adult life -symptoms: depressed mood, irritable, poor concentration, low energy, hopeless at times -denies: no SI, hx of manic symptoms, hospitalizations recently (hx of SI as a teenager and 8 years ago in [place]) -treatments: trazadone, seroquel -she used to see a psychiatrist remotely -denies: alcohol, smoking, drugs  HTN: -meds:amlodipine-benazepril -denies: CP, SOB, DOE  Hypothyroidism: -meds: armor thyroid 120mg  daily; reports changed to this as the levels were up and down on synthroid -wants to check level  Chronic Pain: -had surgery in 10/2014 for L knee and still in pain -seeing a 2nd orthopedic doctor for 2nd opinion -also has RA - sees rheumatologist for this (Dr. Charlestine Night) sees him on a regular basis   ROS: See pertinent positives and negatives per HPI.  Past Medical History  Diagnosis Date  . HYPOTHYROIDISM 10/17/2007  . ANEMIA-NOS 04/23/2008  . DEPRESSION 04/23/2008  . Palpitations     freq at night  . Osteoarthritis   . Hypertension   . Tubulovillous adenoma of colon   . Hyperplastic colon polyp     x2  . RA (rheumatoid arthritis)     "problems in feet, hands, and knees"  . DJD (degenerative joint disease)   . PONV (postoperative nausea and vomiting)     only after 1979 surgery    Past Surgical History  Procedure Laterality Date  . Appendectomy    . Inguinal hernia repair    . Thyroidectomy  2001    for nodules  . Neck fusion      x3  . Bunionectomy  10/2009 right & 02/03/10 left  . Abdominal hysterectomy    . Colonoscopy w/ polypectomy  2015  . 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 Hammer toe surgery      "all toes have pins, done with bunionectomy"  . Total knee  arthroplasty Left 10/21/2014    Procedure: LEFT TOTAL KNEE ARTHROPLASTY;  Surgeon: [name], MD;  Location: [facility];  Service: Orthopedics;  Laterality: Left;    Family History  Problem Relation Age of Onset  . Colon cancer Mother   . Dementia Father   . Heart attack Maternal Grandmother   . Stroke Maternal Grandmother   . Diabetes Maternal Grandmother   . Heart disease Maternal Grandmother   . Esophageal cancer Neg Hx   . Rectal cancer Neg Hx   . Stomach cancer Neg Hx     History   Social History  . Marital Status: Married    Spouse Name: N/A  . Number of Children: 2  . Years of Education: 14   Occupational History  .  [employer]   Social History Main Topics  . Smoking status: Former Smoker -- 0.50 packs/day for 33 years    Quit date: 09/15/2011  . Smokeless tobacco: Never Used  . Alcohol Use: Yes     Comment: rare  . Drug Use: No  . Sexual Activity: Not on file   Other Topics Concern  . 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 Married - 2006  1 son '92  1 dtr '94. Work - at [employer].     Current outpatient prescriptions:  .  amLODipine-benazepril (LOTREL) 5-10 MG per capsule, take 1 capsule by mouth once daily, Disp: 30 capsule, Rfl: 2 .  ARMOUR THYROID 120 MG tablet, take 1 tablet by mouth once daily, Disp: 90 tablet, Rfl: 1 .  omeprazole (PRILOSEC) 40 MG capsule, Take 1 capsule (40 mg total) by mouth daily., Disp: 30 capsule, Rfl: 6 .  DULoxetine (CYMBALTA) 30 MG capsule, Take 2 capsules (60 mg total) by mouth daily., Disp: 60 capsule, Rfl: 3  EXAM:  Filed Vitals:   01/29/15 1335  BP: 124/90  Pulse: 104  Temp: 98.2 F (36.8 C)    Body mass index is 36.66 kg/(m^2).  GENERAL: vitals reviewed and listed above, alert, oriented, appears well hydrated and in no acute distress  HEENT: atraumatic, conjunttiva clear, no obvious abnormalities on inspection of external nose and ears  NECK: no obvious masses on  inspection  LUNGS: clear to auscultation bilaterally, no wheezes, rales or rhonchi, good air movement  CV: HRRR, no peripheral edema  MS: moves all extremities without noticeable abnormality  PSYCH: pleasant and cooperative, no obvious depression or anxiety  ASSESSMENT AND PLAN:  Discussed the following assessment and plan:  Mood disorder  - Plan: DULoxetine (CYMBALTA) 30 MG capsule, TSH -discussed tx options for depression and opted for trial of cymbalta -she denies current or recent SI, thoughts of self harm or any past hx of manic symptoms -advised CBT, regular exercise, healthy diet good sleep hygeine -advised of emergency psychiatric care options and return precautions  Chronic pain syndrome -discussed tx options for chronic musculoskeletal pain and advised against narcotic or opoid pain medications on a regular basis -follow up with rheum and ortho -cymbalta, comprehensive approach to whole body health  Essential hypertension/morbid obesity -stable  Hypothyroidism, unspecified hypothyroidism type -check TSH, may change back to synthroid  New patient visit  -Patient advised to return or notify a doctor immediately if symptoms worsen or persist or new concerns arise.  There are no Patient Instructions on file for this visit.   Colin Benton R.

## 2015-01-29 NOTE — Progress Notes (Signed)
Pre visit review using our clinic review tool, if applicable. No additional management support is needed unless otherwise documented below in the visit note. 

## 2015-01-29 NOTE — Addendum Note (Signed)
Addended by: Agnes Lawrence on: 01/29/2015 02:44 PM   Modules accepted: Orders

## 2015-01-29 NOTE — Patient Instructions (Addendum)
BEFORE YOU LEAVE: -labs  Start the Cymbalta 30mg  daily for 1 week, then may increase to 60 mg (2 tablets) daily. Do not every stop this medication suddenly - is to be tapered when stopped.  Call to schedule an appointment with Dr Glennon Hamilton  Follow up as scheduled or in 1 month if needed  Seek emergency care or psychiatric care immediately is worsening depression, thoughts of hurting yourself or other or severe psychiatric symptoms

## 2015-01-30 MED ORDER — THYROID 120 MG PO TABS: 120.0000 mg | ORAL_TABLET | Freq: Every day | ORAL | Status: DC

## 2015-01-30 NOTE — Addendum Note (Signed)
Addended by: Agnes Lawrence on: 01/30/2015 08:25 AM   Modules accepted: Orders

## 2015-02-05 ENCOUNTER — Encounter: Payer: Self-pay | Admitting: Family Medicine

## 2015-02-05 ENCOUNTER — Ambulatory Visit (INDEPENDENT_AMBULATORY_CARE_PROVIDER_SITE_OTHER): Payer: BLUE CROSS/BLUE SHIELD | Admitting: Family Medicine

## 2015-02-05 ENCOUNTER — Telehealth: Payer: Self-pay | Admitting: Family Medicine

## 2015-02-05 VITALS — BP 122/84 | HR 88 | Temp 98.2°F | Ht 62.0 in | Wt 195.2 lb

## 2015-02-05 DIAGNOSIS — I10 Essential (primary) hypertension: Secondary | ICD-10-CM

## 2015-02-05 DIAGNOSIS — R000 Tachycardia, unspecified: Secondary | ICD-10-CM | POA: Diagnosis not present

## 2015-02-05 DIAGNOSIS — E039 Hypothyroidism, unspecified: Secondary | ICD-10-CM | POA: Diagnosis not present

## 2015-02-05 NOTE — Telephone Encounter (Signed)
Patient saw DO Maudie Mercury today

## 2015-02-05 NOTE — Progress Notes (Signed)
Pre visit review using our clinic review tool, if applicable. No additional management support is needed unless otherwise documented below in the visit note. 

## 2015-02-05 NOTE — Telephone Encounter (Signed)
[facility] - Client [facility] Patient Name: Paula Jacobs DOB: 11-05-1960 Initial Comment Caller states she had 150 heart rate at physical therapy, and was told to call her dr, Pervis Hocking from office for triage Nurse Assessment Nurse: Erlene Quan, RN, Manuela Schwartz Date/Time Eilene Ghazi Time): 02/05/2015 12:21:14 PM Confirm and document reason for call. If symptomatic, describe symptoms. ---Caller states she had 150 heart rate at physical therapy, and was told to call her doctor - she had knee replacement in February - xfer from office for triage - states her heart rate was up when she was in the office last and they took it 2 times because it was high but nothing was said about it - states she has flutters too and she has had that since 2001 since she had her thyroid taken out - but it seems to be getting worse now and her PT said they will not let her do PT until she sees her MD Has the patient traveled out of the country within the last 30 days? ---Not Applicable Does the patient require triage? ---Yes Related visit to physician within the last 2 weeks? ---Yes Does the PT have any chronic conditions? (i.e. diabetes, asthma, etc.) ---Yes List chronic conditions. ---EMR Did the patient indicate they were pregnant? ---No Guidelines Guideline Title Affirmed Question Affirmed Notes Heart Rate and Heartbeat Questions [1] Skipped or extra beat(s) AND [2] increases with exercise or exertion Final Disposition User See Physician within 4 Hours (or PCP triage) Erlene Quan, RN, Manuela Schwartz Comments appointment scheduled for 2:15 today with Dr Maudie Mercury

## 2015-02-05 NOTE — Patient Instructions (Signed)
Please see your cardiologist if further concerns  Palpitations A palpitation is the feeling that your heartbeat is irregular. It may feel like your heart is fluttering or skipping a beat. It may also feel like your heart is beating faster than normal. This is usually not a serious problem. In some cases, you may need more medical tests. HOME CARE  Avoid:  Caffeine in coffee, tea, soft drinks, diet pills, and energy drinks.  Chocolate.  Alcohol.   Stop smoking if you smoke.   Reduce your stress and anxiety. Try:  A method that measures bodily functions so you can learn to control them (biofeedback).  Yoga.  Meditation.  Physical activity such as swimming, jogging, or walking.  Get plenty of rest and sleep. GET HELP IF:  Your fast or irregular heartbeat continues after 24 hours.  Your palpitations occur more often. GET HELP RIGHT AWAY IF:   You have chest pain.  You feel short of breath.  You have a very bad headache.  You feel dizzy or pass out (faint). MAKE SURE YOU:   Understand these instructions.  Will watch your condition.  Will get help right away if you are not doing well or get worse. Document Released: 06/14/2008 Document Revised: 01/20/2014 Document Reviewed: 11/04/2011 [facility] Patient Information 2015 [place], [place]. This information is not intended to replace advice given to you by your health care provider. Make sure you discuss any questions you have with your health care provider.

## 2015-02-05 NOTE — Progress Notes (Signed)
HPI:  Acute visit for:  Fast heart rate: -reports hx of palpitations and fast HR since 2001 - reports she saw cardiologist twice in the past for this and was put on medication for fast heart rate (she can't remember the name) but she reports was stopped due to low blood pressure  -reports told had palpitations and HR 150 at physical therapy - she was doing some lower leg workouts on bicycle at the time -she has not been doing any CV exercises otherwise -she has palpitations 1-3 times per week lasting a few seconds chornically -she denies caffiene use, CP, SOB, DOE, swelling, presyncope or dizziness -on armor thyroid with prior PCP, recent tsh fine  ROS: See pertinent positives and negatives per HPI.  Past Medical History  Diagnosis Date  . HYPOTHYROIDISM 10/17/2007  . ANEMIA-NOS 04/23/2008  . DEPRESSION 04/23/2008  . Palpitations     freq at night  . Osteoarthritis   . Hypertension   . Tubulovillous adenoma of colon   . Hyperplastic colon polyp     x2  . RA (rheumatoid arthritis)     "problems in feet, hands, and knees"  . DJD (degenerative joint disease)   . PONV (postoperative nausea and vomiting)     only after 1979 surgery    Past Surgical History  Procedure Laterality Date  . Appendectomy    . Inguinal hernia repair    . Thyroidectomy  2001    for nodules  . Neck fusion      x3  . Bunionectomy  10/2009 right & 02/03/10 left  . Abdominal hysterectomy    . Colonoscopy w/ polypectomy  2015  . 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 Hammer toe surgery      "all toes have pins, done with bunionectomy"  . Total knee arthroplasty Left 10/21/2014    Procedure: LEFT TOTAL KNEE ARTHROPLASTY;  Surgeon: [name], MD;  Location: [facility];  Service: Orthopedics;  Laterality: Left;    Family History  Problem Relation Age of Onset  . Colon cancer Mother   . Dementia Father   . Heart attack Maternal Grandmother   . Stroke Maternal Grandmother   . Diabetes  Maternal Grandmother   . Heart disease Maternal Grandmother   . Esophageal cancer Neg Hx   . Rectal cancer Neg Hx   . Stomach cancer Neg Hx     History   Social History  . Marital Status: Married    Spouse Name: N/A  . Number of Children: 2  . Years of Education: 14   Occupational History  .  [employer]   Social History Main Topics  . Smoking status: Former Smoker -- 0.50 packs/day for 33 years    Quit date: 09/15/2011  . Smokeless tobacco: Never Used  . Alcohol Use: Yes     Comment: rare  . Drug Use: No  . Sexual Activity: Not on file   Other Topics Concern  . 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 Married - 2006  1 son '92  1 dtr '94. Work - at [employer].     Current outpatient prescriptions:  .  amLODipine-benazepril (LOTREL) 5-10 MG per capsule, Take 1 capsule by mouth daily., Disp: 30 capsule, Rfl: 2 .  DULoxetine (CYMBALTA) 30 MG capsule, Take 2 capsules (60 mg total) by mouth daily., Disp: 60 capsule, Rfl: 3 .  omeprazole (PRILOSEC) 40 MG  capsule, Take 1 capsule (40 mg total) by mouth daily., Disp: 30 capsule, Rfl: 6 .  thyroid (ARMOUR THYROID) 120 MG tablet, Take 1 tablet (120 mg total) by mouth daily., Disp: 90 tablet, Rfl: 1  EXAM:  Filed Vitals:   02/05/15 1354  BP: 122/84  Pulse: 88  Temp: 98.2 F (36.8 C)    Body mass index is 35.69 kg/(m^2).  GENERAL: vitals reviewed and listed above, alert, oriented, appears well hydrated and in no acute distress  HEENT: atraumatic, conjunttiva clear, no obvious abnormalities on inspection of external nose and ears  NECK: no obvious masses on inspection  LUNGS: clear to auscultation bilaterally, no wheezes, rales or rhonchi, good air movement  CV: HRRR, no peripheral edema  MS: moves all extremities without noticeable abnormality  PSYCH: pleasant and cooperative, no obvious depression or anxiety  ASSESSMENT AND PLAN:  Discussed the following assessment and  plan:  Tachycardia - Plan: EKG 12-Lead  Hypothyroidism, unspecified hypothyroidism type  Essential hypertension  -EKG unchanged, reviewed prior cards notes - several evals for same in the past -discussed eval and management of palpitations - her's seem fairly mild and with hx hypotension with tx she opted to not treat at this time -advised follow up with her cardiologist if worsening, new symptoms, other concerns -Patient advised to return or notify a doctor immediately if symptoms worsen or persist or new concerns arise.  Patient Instructions  Please see your cardiologist if further concerns  Palpitations A palpitation is the feeling that your heartbeat is irregular. It may feel like your heart is fluttering or skipping a beat. It may also feel like your heart is beating faster than normal. This is usually not a serious problem. In some cases, you may need more medical tests. HOME CARE  Avoid:  Caffeine in coffee, tea, soft drinks, diet pills, and energy drinks.  Chocolate.  Alcohol.   Stop smoking if you smoke.   Reduce your stress and anxiety. Try:  A method that measures bodily functions so you can learn to control them (biofeedback).  Yoga.  Meditation.  Physical activity such as swimming, jogging, or walking.  Get plenty of rest and sleep. GET HELP IF:  Your fast or irregular heartbeat continues after 24 hours.  Your palpitations occur more often. GET HELP RIGHT AWAY IF:   You have chest pain.  You feel short of breath.  You have a very bad headache.  You feel dizzy or pass out (faint). MAKE SURE YOU:   Understand these instructions.  Will watch your condition.  Will get help right away if you are not doing well or get worse. Document Released: 06/14/2008 Document Revised: 01/20/2014 Document Reviewed: 11/04/2011 [facility] Patient Information 2015 [place], [place]. This information is not intended to replace advice given to you by your health care  provider. Make sure you discuss any questions you have with your health care provider.      Colin Benton R.

## 2015-04-06 ENCOUNTER — Ambulatory Visit: Payer: BLUE CROSS/BLUE SHIELD | Admitting: Family Medicine

## 2015-04-08 ENCOUNTER — Other Ambulatory Visit: Payer: Self-pay

## 2015-04-08 ENCOUNTER — Telehealth: Payer: Self-pay | Admitting: Family Medicine

## 2015-04-08 NOTE — Telephone Encounter (Signed)
Pt said she call the breast center to schedule her mammogram and was told she needed a referral for a diagnostic mammogram because of issues with her right breast.

## 2015-04-09 NOTE — Telephone Encounter (Signed)
Per her last mammogram she needs a regular screening mammogram. If she has any breast complaints that are new she would need an office visit to evaluated - otherwise please place orders for regular screening mammogram. Thanks.

## 2015-04-09 NOTE — Telephone Encounter (Signed)
Dr Kim-I called the [facility] and spoke with [facility] and she stated the pt must have some type of problem and needs a diagnostic and ultrasound.  Does the pt need an office visit prior to ordering the tests?

## 2015-04-10 NOTE — Telephone Encounter (Signed)
I called the pt and informed her of the message below and she stated she was having right breast pain 2 weeks ago when she was bending over and she does not have any pain now.  I offered her an appt to be seen and she stated she has an appt next Friday and will discuss this at her visit.

## 2015-04-17 ENCOUNTER — Ambulatory Visit (INDEPENDENT_AMBULATORY_CARE_PROVIDER_SITE_OTHER): Payer: BLUE CROSS/BLUE SHIELD | Admitting: Family Medicine

## 2015-04-17 DIAGNOSIS — R69 Illness, unspecified: Secondary | ICD-10-CM

## 2015-04-17 NOTE — Progress Notes (Signed)
   HPI:  AOLANI PIGGOTT is here to establish care.  Last PCP and physical:  NO SHOW  Talajah Slimp Wollenberg is a 54 yo prior patient of Dr. Leanne Chang and Roxy Cedar with a PMH of RA, HTN, GERD, hypothyroidism and Depression, without a PCP currently. She did not show up for her visit today. On ROC prior to appt:  Depressed mood: -chronic-  intermittent her whole adult life -treatments: trazadone, seroquel in the past; cymbalta started 01/2015 and advised CBT -she used to see a psychiatrist remotely -denies:  History at initial evaluation with me 01/2015: -symptoms: depressed mood, irritable, poor concentration, low energy, hopeless at times, y (hx of SI as a teenager and 8 years ago in [place])  HTN/Palipitations: -meds:amlodipine-benazepril -several evals with cards in the past for mild palpitations -denies:   Hypothyroidism: -meds: armor thyroid 120mg  daily; reported changed to this as the levels were up and down on synthroid -denies: -have discussed issues with armor thyroid and advise levothyroxine  Chronic Pain: -had surgery in 10/2014 for L knee and still in pain -seeing a 2nd orthopedic doctor for 2nd opinion -also has RA - sees rheumatologist for this (Dr. Charlestine Night) sees him on a regular basis

## 2015-04-22 ENCOUNTER — Ambulatory Visit: Payer: BLUE CROSS/BLUE SHIELD | Admitting: Cardiovascular Disease

## 2015-05-01 ENCOUNTER — Encounter: Payer: Self-pay | Admitting: Cardiovascular Disease

## 2015-06-02 ENCOUNTER — Ambulatory Visit (INDEPENDENT_AMBULATORY_CARE_PROVIDER_SITE_OTHER): Payer: BLUE CROSS/BLUE SHIELD | Admitting: Family Medicine

## 2015-06-02 ENCOUNTER — Encounter: Payer: Self-pay | Admitting: Family Medicine

## 2015-06-02 VITALS — BP 138/88 | HR 80 | Temp 98.1°F | Ht 62.0 in | Wt 207.5 lb

## 2015-06-02 DIAGNOSIS — F39 Unspecified mood [affective] disorder: Secondary | ICD-10-CM

## 2015-06-02 DIAGNOSIS — F329 Major depressive disorder, single episode, unspecified: Secondary | ICD-10-CM

## 2015-06-02 DIAGNOSIS — Z23 Encounter for immunization: Secondary | ICD-10-CM

## 2015-06-02 DIAGNOSIS — E039 Hypothyroidism, unspecified: Secondary | ICD-10-CM | POA: Diagnosis not present

## 2015-06-02 DIAGNOSIS — F32A Depression, unspecified: Secondary | ICD-10-CM

## 2015-06-02 DIAGNOSIS — R5382 Chronic fatigue, unspecified: Secondary | ICD-10-CM

## 2015-06-02 DIAGNOSIS — M2550 Pain in unspecified joint: Secondary | ICD-10-CM | POA: Diagnosis not present

## 2015-06-02 LAB — BASIC METABOLIC PANEL
BUN: 15 mg/dL (ref 6–23)
CHLORIDE: 105 meq/L (ref 96–112)
CO2: 28 mEq/L (ref 19–32)
Calcium: 9.1 mg/dL (ref 8.4–10.5)
Creatinine, Ser: 0.6 mg/dL (ref 0.40–1.20)
GFR: 134.7 mL/min (ref >60.00)
Glucose, Bld: 95 mg/dL (ref 70–99)
POTASSIUM: 3.8 meq/L (ref 3.5–5.1)
Sodium: 141 mEq/L (ref 135–145)

## 2015-06-02 LAB — CBC
HEMATOCRIT: 38.3 % (ref 36.0–46.0)
Hemoglobin: 12.4 g/dL (ref 12.0–15.0)
MCHC: 32.5 g/dL (ref 30.0–36.0)
MCV: 83.3 fl (ref 78.0–100.0)
Platelets: 376 10*3/uL (ref 150.0–400.0)
RBC: 4.59 Mil/uL (ref 3.87–5.11)
RDW: 13.7 % (ref 11.5–15.5)
WBC: 6.7 10*3/uL (ref 4.0–10.5)

## 2015-06-02 LAB — T4, FREE: Free T4: 0.61 ng/dL (ref 0.60–1.60)

## 2015-06-02 LAB — TSH: TSH: 6.46 u[IU]/mL — AB (ref 0.35–4.50)

## 2015-06-02 MED ORDER — DULOXETINE HCL 60 MG PO CPEP: 60.0000 mg | ORAL_CAPSULE | Freq: Every day | ORAL | Status: DC

## 2015-06-02 NOTE — Patient Instructions (Signed)
BEFORE YOU LEAVE: -flu shot -see if she wants to add hep c screening to labs -labs -follow up in 3 months  Cymbalta ONE tablet (60mg  ) daily  We placed a referral for you as discussed to the rheumatologist and the endocrinologist. It usually takes about 1-2 weeks to process and schedule this referral. If you have not heard from [place] regarding this appointment in 2 weeks please contact our office.

## 2015-06-02 NOTE — Progress Notes (Signed)
HPI:  Paula Jacobs is a pleasant 54 yo F with a hx of frequent no shows and PMH sig for depression, HTN, hypothyroidism, paliptations, chronic pain and RA (managed by her rheumatologist and several orthopedic doctors) here for an acute visit for multiple issues:  Joint pain: -sees Dr. Charlestine Night for her RA and several orthopedic doctors -started [id number] when thryoid issues started -chronic migratory pain and swelling in joints, hands and wrist more lately -reports Dr. Charlestine Night told her she had RA, then told her he is not sure -she is frustrated and wants a 2nd opinion at a tertiary care center, prefers [facility] -denies: fevers, weight loss, CP, SOB DOE, skin rash  Depression: -on Cymbalta -frustrated with health and suffers from chronic mild depressed mood, wants to increase -denies: SI, thoughts of self harm  Hypothyroidism: -reports chronic issues with getting level right -now on armour thyroid with prior PCP -she feels worse on this and is frustrated as feels thyroid is contributing to her jt issues, she wants to see endo -denies: weight changes, fevers, bowel changes -has chronic fatigue and jt issues which she reports started with thyroid issues   ROS: See pertinent positives and negatives per HPI.  Past Medical History  Diagnosis Date  . HYPOTHYROIDISM 10/17/2007  . ANEMIA-NOS 04/23/2008  . DEPRESSION 04/23/2008  . Palpitations     freq at night  . Osteoarthritis   . Hypertension   . Tubulovillous adenoma of colon   . Hyperplastic colon polyp     x2  . RA (rheumatoid arthritis)     "problems in feet, hands, and knees"  . DJD (degenerative joint disease)   . PONV (postoperative nausea and vomiting)     only after 1979 surgery    Past Surgical History  Procedure Laterality Date  . Appendectomy    . Inguinal hernia repair    . Thyroidectomy  2001    for nodules  . Neck fusion      x3  . Bunionectomy  10/2009 right & 02/03/10 left  . Abdominal hysterectomy    .  Colonoscopy w/ polypectomy  2015  . 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 Hammer toe surgery      "all toes have pins, done with bunionectomy"  . Total knee arthroplasty Left 10/21/2014    Procedure: LEFT TOTAL KNEE ARTHROPLASTY;  Surgeon: [name], MD;  Location: [facility];  Service: Orthopedics;  Laterality: Left;    Family History  Problem Relation Age of Onset  . Colon cancer Mother   . Dementia Father   . Heart attack Maternal Grandmother   . Stroke Maternal Grandmother   . Diabetes Maternal Grandmother   . Heart disease Maternal Grandmother   . Esophageal cancer Neg Hx   . Rectal cancer Neg Hx   . Stomach cancer Neg Hx     Social History   Social History  . Marital Status: Married    Spouse Name: N/A  . Number of Children: 2  . Years of Education: 14   Occupational History  .  [employer]   Social History Main Topics  . Smoking status: Former Smoker -- 0.50 packs/day for 33 years    Quit date: 09/15/2011  . Smokeless tobacco: Never Used  . Alcohol Use: Yes     Comment: rare  . Drug Use: No  . Sexual Activity: Not Asked   Other Topics Concern  . 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 Married - 2006  1 son '92  1 dtr '94. Work - at [employer].     Current outpatient prescriptions:  .  amLODipine-benazepril (LOTREL) 5-10 MG per capsule, Take 1 capsule by mouth daily., Disp: 30 capsule, Rfl: 2 .  DULoxetine (CYMBALTA) 60 MG capsule, Take 1 capsule (60 mg total) by mouth daily., Disp: 30 capsule, Rfl: 3 .  omeprazole (PRILOSEC) 40 MG capsule, Take 1 capsule (40 mg total) by mouth daily., Disp: 30 capsule, Rfl: 6 .  thyroid (ARMOUR THYROID) 120 MG tablet, Take 1 tablet (120 mg total) by mouth daily., Disp: 90 tablet, Rfl: 1  EXAM:  Filed Vitals:   06/02/15 1510  BP: 138/88  Pulse: 80  Temp: 98.1 F (36.7 C)    Body mass index is 37.94 kg/(m^2).  GENERAL: vitals reviewed and listed above, alert,  oriented, appears well hydrated and in no acute distress  HEENT: atraumatic, conjunttiva clear, no obvious abnormalities on inspection of external nose and ears  NECK: no obvious masses on inspection  LUNGS: clear to auscultation bilaterally, no wheezes, rales or rhonchi, good air movement  CV: HRRR, no peripheral edema  MS: moves all extremities without noticeable abnormality  PSYCH: pleasant and cooperative, no obvious depression or anxiety  ASSESSMENT AND PLAN:  Discussed the following assessment and plan:  Joint pain - Plan: Ambulatory referral to Rheumatology -she has a dx of RA per prior PCP notes, no rheum notes in chart - asked assistant to obtain -referral per her request to tertiary care center  Depression - Plan: Basic metabolic panel, CBC (no diff) -cont cymbalta 60mg , CBT advised  Hypothyroidism, unspecified hypothyroidism type - Plan: TSH, T4, Free, Ambulatory referral to Endocrinology -she is frustrated and feels symptoms all related to her thyroid -discussed checking labs, trying trial back on levothyroxine instead of armour thyroid, she opted to see endo and referral placed  Chronic fatigue - Plan: Basic metabolic panel, CBC (no diff)  -discussed importance of healthy lifestyle to support body -Patient advised to return or notify a doctor immediately if symptoms worsen or persist or new concerns arise.  Patient Instructions  BEFORE YOU LEAVE: -flu shot -see if she wants to add hep c screening to labs -labs -follow up in 3 months  Cymbalta ONE tablet (60mg  ) daily  We placed a referral for you as discussed to the rheumatologist and the endocrinologist. It usually takes about 1-2 weeks to process and schedule this referral. If you have not heard from [place] regarding this appointment in 2 weeks please contact our office.      Colin Benton R.

## 2015-06-02 NOTE — Progress Notes (Signed)
Pre visit review using our clinic review tool, if applicable. No additional management support is needed unless otherwise documented below in the visit note. 

## 2015-06-10 ENCOUNTER — Encounter: Payer: Self-pay | Admitting: Endocrinology

## 2015-06-10 ENCOUNTER — Ambulatory Visit (INDEPENDENT_AMBULATORY_CARE_PROVIDER_SITE_OTHER): Payer: BLUE CROSS/BLUE SHIELD | Admitting: Endocrinology

## 2015-06-10 VITALS — BP 137/88 | HR 76 | Temp 98.1°F | Ht 62.0 in | Wt 205.0 lb

## 2015-06-10 DIAGNOSIS — E039 Hypothyroidism, unspecified: Secondary | ICD-10-CM

## 2015-06-10 MED ORDER — LEVOTHYROXINE SODIUM 175 MCG PO TABS: 175.0000 ug | ORAL_TABLET | Freq: Every day | ORAL | Status: DC

## 2015-06-10 NOTE — Progress Notes (Signed)
Subjective:    Patient ID: Paula Jacobs, female    DOB: 1961/08/16, 54 y.o.   MRN: [id number]  HPI Pt had thyroidectomy in 2001, for multiple nodules.  she has never been on prescribed thyroid hormone therapy.  she has never taken kelp or any other type of non-prescribed thyroid product.  she has never had thyroid imaging.  He has never had XRT to the neck.  He has never been on amiodarone or lithium.  In 2014, synthroid was changed to armour thyroid, due to variable TFT.  She has moderate arthralgias throughout the body, and assoc insomnia.   Past Medical History  Diagnosis Date  . HYPOTHYROIDISM 10/17/2007  . ANEMIA-NOS 04/23/2008  . DEPRESSION 04/23/2008  . Palpitations     freq at night  . Osteoarthritis   . Hypertension   . Tubulovillous adenoma of colon   . Hyperplastic colon polyp     x2  . RA (rheumatoid arthritis)     "problems in feet, hands, and knees"  . DJD (degenerative joint disease)   . PONV (postoperative nausea and vomiting)     only after 1979 surgery    Past Surgical History  Procedure Laterality Date  . Appendectomy    . Inguinal hernia repair    . Thyroidectomy  2001    for nodules  . Neck fusion      x3  . Bunionectomy  10/2009 right & 02/03/10 left  . Abdominal hysterectomy    . Colonoscopy w/ polypectomy  2015  . 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 Hammer toe surgery      "all toes have pins, done with bunionectomy"  . Total knee arthroplasty Left 10/21/2014    Procedure: LEFT TOTAL KNEE ARTHROPLASTY;  Surgeon: [name], MD;  Location: [facility];  Service: Orthopedics;  Laterality: Left;    Social History   Social History  . Marital Status: Married    Spouse Name: N/A  . Number of Children: 2  . Years of Education: 14   Occupational History  .  [employer]   Social History Main Topics  . Smoking status: Former Smoker -- 0.50 packs/day for 33 years    Quit date: 09/15/2011  . Smokeless tobacco: Never Used    . Alcohol Use: Yes     Comment: rare  . Drug Use: No  . Sexual Activity: Not on file   Other Topics Concern  . 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 Married - 2006  1 son '92  1 dtr '94. Work - at [employer].    Current Outpatient Prescriptions on File Prior to Visit  Medication Sig Dispense Refill  . amLODipine-benazepril (LOTREL) 5-10 MG per capsule Take 1 capsule by mouth daily. 30 capsule 2  . DULoxetine (CYMBALTA) 60 MG capsule Take 1 capsule (60 mg total) by mouth daily. 30 capsule 3  . omeprazole (PRILOSEC) 40 MG capsule Take 1 capsule (40 mg total) by mouth daily. 30 capsule 6   No current facility-administered medications on file prior to visit.    Allergies  Allergen Reactions  . Hydrocodone-Acetaminophen Itching  . Latex Itching  . Tramadol Hcl     REACTION: itching, nausea    Family History  Problem Relation Age of Onset  . Colon cancer Mother   . Dementia Father   . Heart attack Maternal Grandmother   . Stroke  Maternal Grandmother   . Diabetes Maternal Grandmother   . Heart disease Maternal Grandmother   . Esophageal cancer Neg Hx   . Rectal cancer Neg Hx   . Stomach cancer Neg Hx   . Thyroid disease Maternal Grandmother     BP 137/88 mmHg  Pulse 76  Temp(Src) 98.1 F (36.7 C) (Oral)  Ht 5\' 2"  (1.575 m)  Wt 205 lb (92.987 kg)  BMI 37.49 kg/m2  SpO2 94%  Review of Systems denies sob, constipation, numbness, cold intolerance, muscle weakness, rhinorrhea, easy bruising, and syncope.  She has diffuse itching, leg cramps, depression, chronic blurry vision (sees opthal), weight gain, and fatigue.      Objective:   Physical Exam VS: see vs page GEN: no distress HEAD: head: no deformity eyes: no periorbital swelling, no proptosis external nose and ears are normal.  mouth: no lesion seen NECK: several healed scars are present (thyroid and C-spine).  i do not appreciate a nodule in the thyroid or elsewhere in  the neck CHEST WALL: no deformity LUNGS:  Clear to auscultation CV: reg rate and rhythm, no murmur ABD: abdomen is soft, nontender.  no hepatosplenomegaly.  not distended.  no hernia MUSCULOSKELETAL: muscle bulk and strength are grossly normal.  no obvious joint swelling.  gait is normal and steady EXTEMITIES: no deformity. no edema.   PULSES: no carotid bruit. NEURO:  cn 2-12 grossly intact.   readily moves all 4's.  sensation is intact to touch on all 4's.  SKIN:  Normal texture and temperature.  No rash or suspicious lesion is visible.   NODES:  None palpable at the neck PSYCH: alert, well-oriented.  Does not appear anxious nor depressed.   Lab Results  Component Value Date   TSH 6.46* 06/02/2015   T4TOTAL 7.8 10/17/2007   i personally reviewed electrocardiogram tracing (02/05/15): Indication: HTN Impression: normal    Assessment & Plan:  Chronic postsurgical hypothyroidism, new to me.  i advised her to change back to levothyroxine, and she agrees.    Patient is advised the following: Patient Instructions  i have sent a prescription to your pharmacy, to change back to the levothyroxine. Please repeat the blood tests in 1 month. I would be happy to see you back here as necessary.   Hypothyroidism The thyroid is a large gland located in the lower front of your neck. The thyroid gland helps control metabolism. Metabolism is how your body handles food. It controls metabolism with the hormone thyroxine. When this gland is underactive (hypothyroid), it produces too little hormone.  CAUSES These include:   Absence or destruction of thyroid tissue.  Goiter due to iodine deficiency.  Goiter due to medications.  Congenital defects (since birth).  Problems with the pituitary. This causes a lack of TSH (thyroid stimulating hormone). This hormone tells the thyroid to turn out more hormone. SYMPTOMS  Lethargy (feeling as though you have no energy)  Cold intolerance  Weight  gain (in spite of normal food intake)  Dry skin  Coarse hair  Menstrual irregularity (if severe, may lead to infertility)  Slowing of thought processes Cardiac problems are also caused by insufficient amounts of thyroid hormone. Hypothyroidism in the newborn is cretinism, and is an extreme form. It is important that this form be treated adequately and immediately or it will lead rapidly to retarded physical and mental development. DIAGNOSIS  To prove hypothyroidism, your caregiver may do blood tests and ultrasound tests. Sometimes the signs are hidden. It may be necessary  for your caregiver to watch this illness with blood tests either before or after diagnosis and treatment. TREATMENT  Low levels of thyroid hormone are increased by using synthetic thyroid hormone. This is a safe, effective treatment. It usually takes about four weeks to gain the full effects of the medication. After you have the full effect of the medication, it will generally take another four weeks for problems to leave. Your caregiver may start you on low doses. If you have had heart problems the dose may be gradually increased. It is generally not an emergency to get rapidly to normal. HOME CARE INSTRUCTIONS   Take your medications as your caregiver suggests. Let your caregiver know of any medications you are taking or start taking. Your caregiver will help you with dosage schedules.  As your condition improves, your dosage needs may increase. It will be necessary to have continuing blood tests as suggested by your caregiver.  Report all suspected medication side effects to your caregiver. SEEK MEDICAL CARE IF: Seek medical care if you develop:  Sweating.  Tremulousness (tremors).  Anxiety.  Rapid weight loss.  Heat intolerance.  Emotional swings.  Diarrhea.  Weakness. SEEK IMMEDIATE MEDICAL CARE IF:  You develop chest pain, an irregular heart beat (palpitations), or a rapid heart beat. MAKE SURE YOU:     Understand these instructions.  Will watch your condition.  Will get help right away if you are not doing well or get worse. Document Released: 09/05/2005 Document Revised: 11/28/2011 Document Reviewed: 04/25/2008 [facility] Patient Information 2015 [place], [place]. This information is not intended to replace advice given to you by your health care provider. Make sure you discuss any questions you have with your health care provider.

## 2015-06-10 NOTE — Patient Instructions (Signed)
i have sent a prescription to your pharmacy, to change back to the levothyroxine. Please repeat the blood tests in 1 month. I would be happy to see you back here as necessary.   Hypothyroidism The thyroid is a large gland located in the lower front of your neck. The thyroid gland helps control metabolism. Metabolism is how your body handles food. It controls metabolism with the hormone thyroxine. When this gland is underactive (hypothyroid), it produces too little hormone.  CAUSES These include:   Absence or destruction of thyroid tissue.  Goiter due to iodine deficiency.  Goiter due to medications.  Congenital defects (since birth).  Problems with the pituitary. This causes a lack of TSH (thyroid stimulating hormone). This hormone tells the thyroid to turn out more hormone. SYMPTOMS  Lethargy (feeling as though you have no energy)  Cold intolerance  Weight gain (in spite of normal food intake)  Dry skin  Coarse hair  Menstrual irregularity (if severe, may lead to infertility)  Slowing of thought processes Cardiac problems are also caused by insufficient amounts of thyroid hormone. Hypothyroidism in the newborn is cretinism, and is an extreme form. It is important that this form be treated adequately and immediately or it will lead rapidly to retarded physical and mental development. DIAGNOSIS  To prove hypothyroidism, your caregiver may do blood tests and ultrasound tests. Sometimes the signs are hidden. It may be necessary for your caregiver to watch this illness with blood tests either before or after diagnosis and treatment. TREATMENT  Low levels of thyroid hormone are increased by using synthetic thyroid hormone. This is a safe, effective treatment. It usually takes about four weeks to gain the full effects of the medication. After you have the full effect of the medication, it will generally take another four weeks for problems to leave. Your caregiver may start you on low  doses. If you have had heart problems the dose may be gradually increased. It is generally not an emergency to get rapidly to normal. HOME CARE INSTRUCTIONS   Take your medications as your caregiver suggests. Let your caregiver know of any medications you are taking or start taking. Your caregiver will help you with dosage schedules.  As your condition improves, your dosage needs may increase. It will be necessary to have continuing blood tests as suggested by your caregiver.  Report all suspected medication side effects to your caregiver. SEEK MEDICAL CARE IF: Seek medical care if you develop:  Sweating.  Tremulousness (tremors).  Anxiety.  Rapid weight loss.  Heat intolerance.  Emotional swings.  Diarrhea.  Weakness. SEEK IMMEDIATE MEDICAL CARE IF:  You develop chest pain, an irregular heart beat (palpitations), or a rapid heart beat. MAKE SURE YOU:   Understand these instructions.  Will watch your condition.  Will get help right away if you are not doing well or get worse. Document Released: 09/05/2005 Document Revised: 11/28/2011 Document Reviewed: 04/25/2008 [facility] Patient Information 2015 [place], [place]. This information is not intended to replace advice given to you by your health care provider. Make sure you discuss any questions you have with your health care provider.

## 2015-07-17 ENCOUNTER — Other Ambulatory Visit: Payer: Self-pay | Admitting: Family Medicine

## 2015-08-06 ENCOUNTER — Ambulatory Visit (INDEPENDENT_AMBULATORY_CARE_PROVIDER_SITE_OTHER): Payer: BLUE CROSS/BLUE SHIELD | Admitting: Family Medicine

## 2015-08-06 VITALS — BP 132/84 | HR 82 | Temp 97.8°F | Resp 18 | Ht 63.5 in | Wt 201.0 lb

## 2015-08-06 DIAGNOSIS — Z111 Encounter for screening for respiratory tuberculosis: Secondary | ICD-10-CM | POA: Diagnosis not present

## 2015-08-06 NOTE — Progress Notes (Signed)
[facility] [address] [place] [address] [phone]  Date:  08/06/2015   Name:  Paula Jacobs   DOB:  06/04/1961   MRN:  [id number]  PCP:  [name]., DO    Chief Complaint: Immunizations   History of Present Illness:  Paula Jacobs is a 54 y.o. 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   She has been tested for TB in the past- never tested positive.   She already had the rest of the required exam for this job- just lacks the PPD  Patient Active Problem List   Diagnosis Date Noted  . Morbid obesity ([place]) 10/22/2014  . 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 S/P knee replacement 10/21/2014  . Rheumatoid arthritis ([place]) 09/26/2013  . Acute pharyngitis 06/27/2012  . Hypertension 01/31/2011  . PAT 10/27/2009  . Hypothyroidism 10/17/2007    Past Medical History  Diagnosis Date  . HYPOTHYROIDISM 10/17/2007  . ANEMIA-NOS 04/23/2008  . DEPRESSION 04/23/2008  . Palpitations     freq at night  . Osteoarthritis   . Hypertension   . Tubulovillous adenoma of colon   . Hyperplastic colon polyp     x2  . RA (rheumatoid arthritis) ([place])     "problems in feet, hands, and knees"  . DJD (degenerative joint disease)   . PONV (postoperative nausea and vomiting)     only after 1979 surgery    Past Surgical History  Procedure Laterality Date  . Appendectomy    . Inguinal hernia repair    . Thyroidectomy  2001    for nodules  . Neck fusion      x3  . Bunionectomy  10/2009 right & 02/03/10 left  . Abdominal hysterectomy    . Colonoscopy w/ polypectomy  2015  . 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 Hammer toe surgery      "all toes have pins, done with bunionectomy"  . Total knee arthroplasty Left 10/21/2014    Procedure: LEFT TOTAL KNEE ARTHROPLASTY;  Surgeon: [name], MD;  Location: [facility];  Service: Orthopedics;   Laterality: Left;    Social History  Substance Use Topics  . Smoking status: Former Smoker -- 0.50 packs/day for 33 years    Quit date: 09/15/2011  . Smokeless tobacco: Never Used  . Alcohol Use: Yes     Comment: rare    Family History  Problem Relation Age of Onset  . Colon cancer Mother   . Dementia Father   . Heart attack Maternal Grandmother   . Stroke Maternal Grandmother   . Diabetes Maternal Grandmother   . Heart disease Maternal Grandmother   . Esophageal cancer Neg Hx   . Rectal cancer Neg Hx   . Stomach cancer Neg Hx   . Thyroid disease Maternal Grandmother     Allergies  Allergen Reactions  . Hydrocodone-Acetaminophen Itching  . Latex Itching  . Tramadol Hcl     REACTION: itching, nausea    Medication list has been reviewed and updated.  Current Outpatient Prescriptions on File Prior to Visit  Medication Sig Dispense Refill  . amLODipine-benazepril (LOTREL) 5-10 MG capsule take 1 capsule by mouth once daily 30 capsule 2  . levothyroxine (SYNTHROID, LEVOTHROID) 175 MCG tablet Take 1 tablet (175 mcg total) by mouth daily before breakfast. 30  tablet 11  . omeprazole (PRILOSEC) 40 MG capsule Take 1 capsule (40 mg total) by mouth daily. 30 capsule 6  . DULoxetine (CYMBALTA) 60 MG capsule Take 1 capsule (60 mg total) by mouth daily. (Patient not taking: Reported on 08/06/2015) 30 capsule 3   No current facility-administered medications on file prior to visit.    Review of Systems:  As per HPI- otherwise negative.   Physical Examination: Filed Vitals:   08/06/15 0852  BP: 132/84  Pulse: 82  Temp: 97.8 F (36.6 C)  Resp: 18   Filed Vitals:   08/06/15 0852  Height: 5' 3.5" (1.613 m)  Weight: 201 lb (91.173 kg)   Body mass index is 35.04 kg/(m^2). Ideal Body Weight: Weight in (lb) to have BMI = 25: 143.1  GEN: WDWN, NAD, Non-toxic, A & O x 3, obese, looks well HEENT: Atraumatic, Normocephalic. Neck supple. No masses, No LAD. Ears and Nose: No  external deformity. CV: RRR, No M/G/R. No JVD. No thrill. No extra heart sounds. PULM: CTA B, no wheezes, crackles, rhonchi. No retractions. No resp. distress. No accessory muscle use. EXTR: No c/c/e NEURO Normal gait.  PSYCH: Normally interactive. Conversant. Not depressed or anxious appearing.  Calm demeanor.    Assessment and Plan: Screening for tuberculosis - Plan: TB Skin Test  Place PPD today  Signed Lamar Blinks, MD

## 2015-08-06 NOTE — Progress Notes (Signed)

## 2015-08-06 NOTE — Patient Instructions (Signed)
Please come back for your TB test read as directed.

## 2015-08-08 ENCOUNTER — Encounter: Payer: BLUE CROSS/BLUE SHIELD | Admitting: Family Medicine

## 2015-08-08 DIAGNOSIS — Z111 Encounter for screening for respiratory tuberculosis: Secondary | ICD-10-CM

## 2015-08-08 LAB — TB SKIN TEST
Induration: 0 mm
TB Skin Test: NEGATIVE

## 2015-10-13 ENCOUNTER — Other Ambulatory Visit: Payer: Self-pay | Admitting: Family Medicine

## 2015-10-13 MED ORDER — AMLODIPINE BESY-BENAZEPRIL HCL 5-10 MG PO CAPS: 1.0000 | ORAL_CAPSULE | Freq: Every day | ORAL | Status: DC

## 2016-01-18 ENCOUNTER — Other Ambulatory Visit: Payer: Self-pay | Admitting: Family Medicine

## 2016-06-05 ENCOUNTER — Other Ambulatory Visit: Payer: Self-pay | Admitting: Family Medicine

## 2016-09-16 ENCOUNTER — Encounter: Payer: Self-pay | Admitting: Family Medicine

## 2016-09-16 ENCOUNTER — Ambulatory Visit (INDEPENDENT_AMBULATORY_CARE_PROVIDER_SITE_OTHER): Payer: Managed Care, Other (non HMO) | Admitting: Family Medicine

## 2016-09-16 VITALS — BP 112/78 | HR 98 | Temp 98.4°F | Ht 63.5 in | Wt 207.0 lb

## 2016-09-16 DIAGNOSIS — E039 Hypothyroidism, unspecified: Secondary | ICD-10-CM | POA: Diagnosis not present

## 2016-09-16 DIAGNOSIS — I10 Essential (primary) hypertension: Secondary | ICD-10-CM | POA: Diagnosis not present

## 2016-09-16 DIAGNOSIS — Z1159 Encounter for screening for other viral diseases: Secondary | ICD-10-CM

## 2016-09-16 DIAGNOSIS — M069 Rheumatoid arthritis, unspecified: Secondary | ICD-10-CM | POA: Diagnosis not present

## 2016-09-16 DIAGNOSIS — F3342 Major depressive disorder, recurrent, in full remission: Secondary | ICD-10-CM

## 2016-09-16 LAB — TSH: TSH: 1.36 u[IU]/mL (ref 0.35–4.50)

## 2016-09-16 NOTE — Progress Notes (Signed)
HPI:  Paula Jacobs is a pleasant 55 year old here for an acute visit for follow-up. She has not been in in some time. She reports that she didn't feel well for several days last week. symptoms included urinary frequency and fatigue. She went to an urgent care and she brings her lab results which included a CBC, urinalysis, blood chemistry  -all unremarkable. She reports she was told to follow-up with [place] to check her thyroid, as they were not able to check this at their office. Since then her symptoms have resolved. She reports that she is frustrated with her weight. She is not getting any regular exercise, but is fairly active at work. She feels that her diet not great. Loves fried chicken and it seems eats out fast food on a regular basis. Denies CP, SOB, DOE, any worsening or uncontrolled depression, constipation or bowel changes. She reports she saw Dr. Charlestine Night recently for follow up regarding her rheumatoid arthritis.   ROS: See pertinent positives and negatives per HPI.  Past Medical History:  Diagnosis Date  . ANEMIA-NOS 04/23/2008  . DEPRESSION 04/23/2008  . DJD (degenerative joint disease)   . Hyperplastic colon polyp    x2  . Hypertension   . HYPOTHYROIDISM 10/17/2007  . Osteoarthritis   . Palpitations    freq at night  . PONV (postoperative nausea and vomiting)    only after 1979 surgery  . RA (rheumatoid arthritis) ([place])    "problems in feet, hands, and knees"  . Tubulovillous adenoma of colon     Past Surgical History:  Procedure Laterality Date  . ABDOMINAL HYSTERECTOMY    . APPENDECTOMY    . BUNIONECTOMY  10/2009 right & 02/03/10 left  . COLONOSCOPY W/ POLYPECTOMY  2015  . HAMMER TOE SURGERY     "all toes have pins, done with bunionectomy"  . INGUINAL HERNIA REPAIR    . neck fusion     x3  . THYROIDECTOMY  2001   for nodules  . TOE AMPUTATION  1979   6th toe removed from each foot  . TOTAL KNEE ARTHROPLASTY Left 10/21/2014   Procedure: LEFT TOTAL KNEE  ARTHROPLASTY;  Surgeon: [name], MD;  Location: [facility];  Service: Orthopedics;  Laterality: Left;    Family History  Problem Relation Age of Onset  . Colon cancer Mother   . Dementia Father   . Heart attack Maternal Grandmother   . Stroke Maternal Grandmother   . Diabetes Maternal Grandmother   . Heart disease Maternal Grandmother   . Esophageal cancer Neg Hx   . Rectal cancer Neg Hx   . Stomach cancer Neg Hx   . Thyroid disease Maternal Grandmother     Social History   Social History  . Marital status: Married    Spouse name: N/A  . Number of children: 2  . Years of education: 14   Occupational History  .  [employer]   Social History Main Topics  . Smoking status: Former Smoker    Packs/day: 0.50    Years: 33.00    Quit date: 09/15/2011  . Smokeless tobacco: Never Used  . Alcohol use Yes     Comment: rare  . Drug use: No  . Sexual activity: Not Asked   Other Topics Concern  . 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 Married - 2006  1 son '92  1 dtr '94. Work - at [employer].  Current Outpatient Prescriptions:  .  amLODipine-benazepril (LOTREL) 5-10 MG capsule, take 1 capsule by mouth once daily, Disp: 30 capsule, Rfl: 0 .  DULoxetine (CYMBALTA) 60 MG capsule, Take 1 capsule (60 mg total) by mouth daily., Disp: 30 capsule, Rfl: 3 .  levothyroxine (SYNTHROID, LEVOTHROID) 175 MCG tablet, Take 1 tablet (175 mcg total) by mouth daily before breakfast., Disp: 30 tablet, Rfl: 11 .  omeprazole (PRILOSEC) 40 MG capsule, Take 1 capsule (40 mg total) by mouth daily., Disp: 30 capsule, Rfl: 6  EXAM:  Vitals:   09/16/16 1303  BP: 112/78  Pulse: 98  Temp: 98.4 F (36.9 C)    Body mass index is 36.09 kg/m.  GENERAL: vitals reviewed and listed above, alert, oriented, appears well hydrated and in no acute distress  HEENT: atraumatic, conjunttiva clear, no obvious abnormalities on inspection of external nose and  ears  NECK: no obvious masses on inspection  LUNGS: clear to auscultation bilaterally, no wheezes, rales or rhonchi, good air movement  CV: HRRR, no peripheral edema  MS: moves all extremities without noticeable abnormality  PSYCH: pleasant and cooperative, no obvious depression or anxiety  ASSESSMENT AND PLAN:  Discussed the following assessment and plan:  Hypothyroidism, unspecified type - Plan: TSH  Essential hypertension  Rheumatoid arthritis, involving unspecified site, unspecified rheumatoid factor presence (HCC)  Morbid obesity ([place])  Need for hepatitis C screening test - Plan: Hepatitis C antibody  Recurrent major depressive disorder, in full remission ([place])  -labs per orders -lengthy discussion of healthy lifestyle and advise med diet and regular aerobic exercise with goal to add two 20 minute upper body and 2 20 minute core workouts per week -we did a diet review and made suggestions for each meal and snacks -advised to schedule mammogram -follow up 3 months -Patient advised to return or notify a doctor immediately if symptoms worsen or persist or new concerns arise.  Patient Instructions  BEFORE YOU LEAVE: -follow up: 3 months -labs  Please call the breast center today to schedule your mammogram.  We have ordered labs or studies at this visit. It can take up to 1-2 weeks for results and processing. IF results require follow up or explanation, we will call you with instructions. Clinically stable results will be released to your [facility]. If you have not heard from [place] or cannot find your results in [facility] in 2 weeks please contact our office at [phone].  If you are not yet signed up for [facility], please consider signing up.   We recommend the following healthy lifestyle for LIFE: 1) Small portions.   Tip: eat off of a salad plate instead of a dinner plate.  Tip: It is ok to feel hungry after a meal of proper portion sizes  Tip: if you need more or a  snack choose fruits, veggies and/or a handful of nuts or seeds.  2) Eat a healthy clean diet.  * Tip: Avoid (less then 1 serving per week): processed foods, sweets, sweetened drinks, white starches (rice, flour, bread, potatoes, pasta, etc), red meat, fast foods, butter  *Tip: CHOOSE instead   * 5-9 servings per day of fresh or frozen fruits and vegetables (but not corn, potatoes, bananas, canned or dried fruit)   *nuts and seeds, beans   *olives and olive oil   *small portions of lean meats such as fish and white chicken    *small portions of whole grains  3)Get at least 150 minutes of sweaty aerobic exercise per week.  4)Reduce  stress - consider counseling, meditation and relaxation to balance other aspects of your life.        Colin Benton R., DO

## 2016-09-16 NOTE — Patient Instructions (Signed)
BEFORE YOU LEAVE: -follow up: 3 months -labs  Please call the breast center today to schedule your mammogram.  We have ordered labs or studies at this visit. It can take up to 1-2 weeks for results and processing. IF results require follow up or explanation, we will call you with instructions. Clinically stable results will be released to your [facility]. If you have not heard from [place] or cannot find your results in [facility] in 2 weeks please contact our office at [phone].  If you are not yet signed up for [facility], please consider signing up.   We recommend the following healthy lifestyle for LIFE: 1) Small portions.   Tip: eat off of a salad plate instead of a dinner plate.  Tip: It is ok to feel hungry after a meal of proper portion sizes  Tip: if you need more or a snack choose fruits, veggies and/or a handful of nuts or seeds.  2) Eat a healthy clean diet.  * Tip: Avoid (less then 1 serving per week): processed foods, sweets, sweetened drinks, white starches (rice, flour, bread, potatoes, pasta, etc), red meat, fast foods, butter  *Tip: CHOOSE instead   * 5-9 servings per day of fresh or frozen fruits and vegetables (but not corn, potatoes, bananas, canned or dried fruit)   *nuts and seeds, beans   *olives and olive oil   *small portions of lean meats such as fish and white chicken    *small portions of whole grains  3)Get at least 150 minutes of sweaty aerobic exercise per week.  4)Reduce stress - consider counseling, meditation and relaxation to balance other aspects of your life.

## 2016-09-16 NOTE — Progress Notes (Signed)
Pre visit review using our clinic review tool, if applicable. No additional management support is needed unless otherwise documented below in the visit note. 

## 2016-09-17 LAB — HEPATITIS C ANTIBODY: HCV Ab: NEGATIVE

## 2016-10-04 ENCOUNTER — Other Ambulatory Visit: Payer: Self-pay | Admitting: Endocrinology

## 2016-10-04 ENCOUNTER — Telehealth: Payer: Self-pay | Admitting: *Deleted

## 2016-10-04 MED ORDER — AMLODIPINE BESY-BENAZEPRIL HCL 5-10 MG PO CAPS: 1.0000 | ORAL_CAPSULE | Freq: Every day | ORAL | 3 refills | Status: DC

## 2016-10-04 NOTE — Telephone Encounter (Signed)
Pt left msg on [facility]/triage stating fail to ask Dr. Maudie Mercury for refills on her Levothyroxine & Norvasc. Wrong office forwarding msg to [place] " Dr. Maudie Mercury"...Johny Chess

## 2016-10-04 NOTE — Telephone Encounter (Signed)
Looks like Dr. Loanne Drilling sent levothyroxine. BP refills sent.

## 2016-12-16 ENCOUNTER — Ambulatory Visit: Payer: Managed Care, Other (non HMO) | Admitting: Family Medicine

## 2016-12-21 NOTE — Progress Notes (Deleted)
HPI:  Follow up: Rarely comes in, hx poor compliance with follow up and recommendations and frequently frustrated. Due for mammogram, labs, CPE next visit.  HTN: -meds amlodipine-benazapril  Depression: -meds: cymbalta  Obesity: -she reported a healthy diet (but then found she eats loves fried chicken and eat fast food on a regular basis) -no aerobic exercise a last visit  Hypothyroidism/thyroid nodules: -sees Dr. Loanne Drilling, Endocrinology  Rheumatoid arthritis: -sees Dr. Charlestine Night, Rheumatology -reportedly frustrated w/ dx of possible RA then told did not have RA, referred for 2nd opinion in the past  ROS: See pertinent positives and negatives per HPI.  Past Medical History:  Diagnosis Date  . ANEMIA-NOS 04/23/2008  . DEPRESSION 04/23/2008  . DJD (degenerative joint disease)   . Hyperplastic colon polyp    x2  . Hypertension   . HYPOTHYROIDISM 10/17/2007  . Osteoarthritis   . Palpitations    freq at night  . PONV (postoperative nausea and vomiting)    only after 1979 surgery  . RA (rheumatoid arthritis) ([place])    "problems in feet, hands, and knees"  . Tubulovillous adenoma of colon     Past Surgical History:  Procedure Laterality Date  . ABDOMINAL HYSTERECTOMY    . APPENDECTOMY    . BUNIONECTOMY  10/2009 right & 02/03/10 left  . COLONOSCOPY W/ POLYPECTOMY  2015  . HAMMER TOE SURGERY     "all toes have pins, done with bunionectomy"  . INGUINAL HERNIA REPAIR    . neck fusion     x3  . THYROIDECTOMY  2001   for nodules  . TOE AMPUTATION  1979   6th toe removed from each foot  . TOTAL KNEE ARTHROPLASTY Left 10/21/2014   Procedure: LEFT TOTAL KNEE ARTHROPLASTY;  Surgeon: [name], MD;  Location: [facility];  Service: Orthopedics;  Laterality: Left;    Family History  Problem Relation Age of Onset  . Colon cancer Mother   . Dementia Father   . Heart attack Maternal Grandmother   . Stroke Maternal Grandmother   . Diabetes Maternal Grandmother   . Heart disease  Maternal Grandmother   . Esophageal cancer Neg Hx   . Rectal cancer Neg Hx   . Stomach cancer Neg Hx   . Thyroid disease Maternal Grandmother     Social History   Social History  . Marital status: Married    Spouse name: N/A  . Number of children: 2  . Years of education: 14   Occupational History  .  [employer]   Social History Main Topics  . Smoking status: Former Smoker    Packs/day: 0.50    Years: 33.00    Quit date: 09/15/2011  . Smokeless tobacco: Never Used  . Alcohol use Yes     Comment: rare  . Drug use: No  . Sexual activity: Not on file   Other Topics Concern  . 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 Married - 2006  1 son '92  1 dtr '94. Work - at [employer].     Current Outpatient Prescriptions:  .  amLODipine-benazepril (LOTREL) 5-10 MG capsule, Take 1 capsule by mouth daily., Disp: 30 capsule, Rfl: 3 .  DULoxetine (CYMBALTA) 60 MG capsule, Take 1 capsule (60 mg total) by mouth daily., Disp: 30 capsule, Rfl: 3 .  levothyroxine (SYNTHROID, LEVOTHROID) 175 MCG tablet, TAKE 1 TABLET BY MOUTH ONCE DAILY BEFORE BREAKFAST, Disp: 30 tablet, Rfl: 0 .  omeprazole (PRILOSEC) 40 MG capsule, Take 1 capsule (40 mg total) by mouth daily., Disp: 30 capsule, Rfl: 6  EXAM:  There were no vitals filed for this visit.  There is no height or weight on file to calculate BMI.  GENERAL: vitals reviewed and listed above, alert, oriented, appears well hydrated and in no acute distress  HEENT: atraumatic, conjunttiva clear, no obvious abnormalities on inspection of external nose and ears  NECK: no obvious masses on inspection  LUNGS: clear to auscultation bilaterally, no wheezes, rales or rhonchi, good air movement  CV: HRRR, no peripheral edema  MS: moves all extremities without noticeable abnormality  PSYCH: pleasant and cooperative, no obvious depression or anxiety  ASSESSMENT AND PLAN:  Discussed the following  assessment and plan:  No diagnosis found.  -Patient advised to return or notify a doctor immediately if symptoms worsen or persist or new concerns arise.  There are no Patient Instructions on file for this visit.  Colin Benton R., DO

## 2016-12-22 ENCOUNTER — Encounter: Payer: Self-pay | Admitting: Internal Medicine

## 2016-12-22 ENCOUNTER — Ambulatory Visit (INDEPENDENT_AMBULATORY_CARE_PROVIDER_SITE_OTHER): Payer: Managed Care, Other (non HMO) | Admitting: Internal Medicine

## 2016-12-22 ENCOUNTER — Ambulatory Visit: Payer: Managed Care, Other (non HMO) | Admitting: Family Medicine

## 2016-12-22 VITALS — BP 120/84 | HR 84 | Temp 98.2°F | Ht 63.5 in | Wt 213.8 lb

## 2016-12-22 DIAGNOSIS — I10 Essential (primary) hypertension: Secondary | ICD-10-CM | POA: Diagnosis not present

## 2016-12-22 DIAGNOSIS — M069 Rheumatoid arthritis, unspecified: Secondary | ICD-10-CM

## 2016-12-22 DIAGNOSIS — E890 Postprocedural hypothyroidism: Secondary | ICD-10-CM | POA: Diagnosis not present

## 2016-12-22 DIAGNOSIS — R079 Chest pain, unspecified: Secondary | ICD-10-CM | POA: Diagnosis not present

## 2016-12-22 DIAGNOSIS — R7989 Other specified abnormal findings of blood chemistry: Secondary | ICD-10-CM | POA: Diagnosis not present

## 2016-12-22 DIAGNOSIS — Z87891 Personal history of nicotine dependence: Secondary | ICD-10-CM

## 2016-12-22 DIAGNOSIS — R0609 Other forms of dyspnea: Secondary | ICD-10-CM | POA: Diagnosis not present

## 2016-12-22 NOTE — Progress Notes (Signed)
Chief Complaint  Patient presents with  . Asthma    started tuesday  . Wheezing    chest tightness    HPI: Paula Jacobs 56 y.o.   SDA  Because  PCP NA  Patient states for the last 1-1/2 months she is having increasing shortness of breath and mid upper chest pain that is unrelenting may be worse with breathing. She feels that she is wheezing. She states she remotely had asthma but is not on medicine for it over years. She works in [place] and went to the emergency room [facility]  2 days ago because of increasing chest pain and shortness of breath.  states she had what a CT scan to make sure she didn't have a blood clot and they told her her heart was okay. However she may be should have a stress test. Blood work was "elevated" but was unable to tell me which was elevated  She's not currently smoking his quit for 7 years. States that she's had on amlodipine for her blood pressure for the last year and a half she had a side effect of the ACE inhibitor's. Blood pressures generally well controlled but was very high in the emergency room. Denies swallowing uses omeprazole as needed a couple times a week because she has a lot of gas. She is not taking Cymbalta that's on her med list tried it irregularly not helpful didn't like the way it made her feel so she stopped it for the last few months. No unusual swelling hemoptysis does get a cough at night sometimes but no serious allergy symptoms.   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  She states she has rheumatoid arthritis and is takes an occasional tramadol even though gives her side effects and her rheumatologist is retiring. She has declined medications for disease state at but it has used an occasional prednisone.  ROS: See pertinent positives and negatives per HPI. Non fever chills with loss  Edema  Says taking amlodipine  Plain and not lotrel.   Past Medical  History:  Diagnosis Date  . ANEMIA-NOS 04/23/2008  . DEPRESSION 04/23/2008  . DJD (degenerative joint disease)   . Hyperplastic colon polyp    x2  . Hypertension   . HYPOTHYROIDISM 10/17/2007  . Osteoarthritis   . Palpitations    freq at night  . PONV (postoperative nausea and vomiting)    only after 1979 surgery  . RA (rheumatoid arthritis) ([place])    "problems in feet, hands, and knees"  . Tubulovillous adenoma of colon     Family History  Problem Relation Age of Onset  . Colon cancer Mother   . Dementia Father   . Heart attack Maternal Grandmother   . Stroke Maternal Grandmother   . Diabetes Maternal Grandmother   . Heart disease Maternal Grandmother   . Esophageal cancer Neg Hx   . Rectal cancer Neg Hx   . Stomach cancer Neg Hx   . Thyroid disease Maternal Grandmother     Social History   Social History  . Marital status: Married    Spouse name: N/A  . Number of children: 2  . Years of education: 14   Occupational History  .  [employer]   Social History Main Topics  . Smoking status: Former Smoker    Packs/day: 0.50    Years: 33.00    Quit date: 09/15/2011  . Smokeless  tobacco: Never Used  . Alcohol use Yes     Comment: rare  . Drug use: No  . Sexual activity: Not Asked   Other Topics Concern  . 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 Married - 2006  1 son '92  1 dtr '94. Work - at [employer].    Outpatient Medications Prior to Visit  Medication Sig Dispense Refill  . amLODipine-benazepril (LOTREL) 5-10 MG capsule Take 1 capsule by mouth daily. 30 capsule 3  . levothyroxine (SYNTHROID, LEVOTHROID) 175 MCG tablet TAKE 1 TABLET BY MOUTH ONCE DAILY BEFORE BREAKFAST 30 tablet 0  . DULoxetine (CYMBALTA) 60 MG capsule Take 1 capsule (60 mg total) by mouth daily. (Patient not taking: Reported on 12/22/2016) 30 capsule 3  . omeprazole (PRILOSEC) 40 MG capsule Take 1 capsule (40 mg total) by mouth daily. (Patient not taking:  Reported on 12/22/2016) 30 capsule 6   No facility-administered medications prior to visit.      EXAM:  BP 120/84 (BP Location: Left Arm, Patient Position: Sitting, Cuff Size: Large)   Pulse 84   Temp 98.2 F (36.8 C) (Oral)   Ht 5' 3.5" (1.613 m)   Wt 213 lb 12.8 oz (97 kg)   SpO2 98%   BMI 37.28 kg/m   Body mass index is 37.28 kg/m.  GENERAL: vitals reviewed and listed above, alert, oriented, appears well hydrated and in no acute distress HEENT: atraumatic, conjunctiva  clear, no obvious abnormalities on inspection of external nose and ears OP : no lesion edema or exudate  NECK: no obvious masses on inspection palpation  LUNGS: clear to auscultation bilaterally, no wheezes, rales or rhonchi,  Abdomen:  Sof,t normal bowel sounds without hepatosplenomegaly, no guarding rebound or masses no CVA tenderness Points to mid sternal area but no mass or tenderness  CV: HRRR, s1 s2 seem nl  no clubbing cyanosis ,trace .   peripheral edema nl cap refill  MS: moves all extremities without noticeable focal  abnormality PSYCH: pleasant and cooperative, no obvious depression or anxiety Lab Results  Component Value Date   WBC 6.7 06/02/2015   HGB 12.4 06/02/2015   HCT 38.3 06/02/2015   PLT 376.0 06/02/2015   GLUCOSE 95 06/02/2015   CHOL 211 (H) 11/30/2011   TRIG 120.0 11/30/2011   HDL 50.40 11/30/2011   LDLDIRECT 134.4 11/30/2011   LDLCALC 107 (H) 11/12/2009   ALT 16 11/27/2013   AST 13 11/27/2013   NA 141 06/02/2015   K 3.8 06/02/2015   CL 105 06/02/2015   CREATININE 0.60 06/02/2015   BUN 15 06/02/2015   CO2 28 06/02/2015   TSH 1.36 09/16/2016   INR 1.07 10/15/2014   HGBA1C 5.5 09/16/2013   Wt Readings from Last 3 Encounters:  12/22/16 213 lb 12.8 oz (97 kg)  09/16/16 207 lb (93.9 kg)  08/06/15 201 lb (91.2 kg)   BP Readings from Last 3 Encounters:  12/22/16 120/84  09/16/16 112/78  08/06/15 132/84    ASSESSMENT AND PLAN:  Discussed the following assessment and  plan:  Chest pain, unspecified type - neg troponin in ed  ct no calcium in  arteries on ct  - Plan: Pulmonary function test  DOE (dyspnea on exertion) - Plan: Pulmonary function test  Essential hypertension - pt states on plain amlodipine not lotrel  ? med lsit is not accurate?  Postoperative hypothyroidism  Rheumatoid arthritis, involving unspecified site, unspecified rheumatoid factor presence (HCC) - no on  dmmeds per patient choice   History of tobacco use - quit for 7 years   Positive D dimer - neg chset ct for clot   Post emergency room evaluation for 1-1/2 months of increasing chest pain shortness of breath that does not sound acutely  ischemic  fortunately her exam is reassuring today. She does state her CT was normal and did not have any heart attack as far she knows. reviewe showed COPD changes  ? And scaring  Not consistent with typical asthma although could have underlying lung disease. As cause   Sent for pulmonary function tests and spirometry Follow-up needed if need stress test cardiology evaluation would have PCP help with this. She is off work this week but needs a note to go back. -Patient advised to retunn or notify health care team  if symptoms worsen ,persist or new concerns arise. Optimize her acid reflux treatment and have her take the omeprazole every day for the next 2 weeks. We'll put in referrals but is getting worse contact for reevaluation. Record reveiwe   cw poss copd      Order for pfts may benefit from  Copd rx   consdier ech o etc to check for elevated PAP etc  But best ordered by PCP    Note for work fu if worsening int he interim .  Continue tobacco free.    Expectant management.    Patient Instructions   doesn't sound like classic asthma. Poss copd or lung scarring   dosent explain the chest pain.  We'll arrange lung function tests to check your lung function. Increase your omeprazole and take it every day for the next 2 weeks. We may get a  cardiology consult about advice for further cardiovascular testing such as stress test and echo tests. There are many causes of your symptoms but fortunately it sounds like he did not have an acute heart attack or blood clot based on your history and record  reviewed.       Standley Brooking. Paula Jacobs M.D.

## 2016-12-22 NOTE — Patient Instructions (Addendum)
doesn't sound like classic asthma. Poss copd or lung scarring   dosent explain the chest pain.  We'll arrange lung function tests to check your lung function. Increase your omeprazole and take it every day for the next 2 weeks. We may get a cardiology consult about advice for further cardiovascular testing such as stress test and echo tests. There are many causes of your symptoms but fortunately it sounds like he did not have an acute heart attack or blood clot based on your history and record  reviewed.

## 2016-12-26 ENCOUNTER — Encounter: Payer: Self-pay | Admitting: Family Medicine

## 2016-12-26 ENCOUNTER — Ambulatory Visit (INDEPENDENT_AMBULATORY_CARE_PROVIDER_SITE_OTHER): Payer: Managed Care, Other (non HMO) | Admitting: Family Medicine

## 2016-12-26 VITALS — BP 112/80 | HR 87 | Temp 98.2°F | Ht 63.5 in | Wt 215.1 lb

## 2016-12-26 DIAGNOSIS — R059 Cough, unspecified: Secondary | ICD-10-CM

## 2016-12-26 DIAGNOSIS — M069 Rheumatoid arthritis, unspecified: Secondary | ICD-10-CM | POA: Diagnosis not present

## 2016-12-26 DIAGNOSIS — R0600 Dyspnea, unspecified: Secondary | ICD-10-CM

## 2016-12-26 DIAGNOSIS — R05 Cough: Secondary | ICD-10-CM

## 2016-12-26 DIAGNOSIS — R0789 Other chest pain: Secondary | ICD-10-CM | POA: Diagnosis not present

## 2016-12-26 MED ORDER — PREDNISONE 20 MG PO TABS
40.0000 mg | ORAL_TABLET | Freq: Every day | ORAL | 0 refills | Status: DC
Start: 2016-12-26 — End: 2017-04-11

## 2016-12-26 MED ORDER — DOXYCYCLINE HYCLATE 100 MG PO TABS: 100.0000 mg | ORAL_TABLET | Freq: Two times a day (BID) | ORAL | 0 refills | Status: DC

## 2016-12-26 MED ORDER — ALBUTEROL SULFATE HFA 108 (90 BASE) MCG/ACT IN AERS: 2.0000 | INHALATION_SPRAY | Freq: Four times a day (QID) | RESPIRATORY_TRACT | 0 refills | Status: DC | PRN

## 2016-12-26 NOTE — Progress Notes (Signed)
Pre visit review using our clinic review tool, if applicable. No additional management support is needed unless otherwise documented below in the visit note. 

## 2016-12-26 NOTE — Progress Notes (Signed)
HPI:  Paula Jacobs is a pleasant 56 yo, with hx poor compliance, hx RA (sees rheumatologist, Depression, HTN, Hypothyroidism and morbid obesity here for follow up. Went to ER and then saw Dr. Regis Bill for atypical chest pain 12/23/16. Had negative CT chest, neg trops, labs, ? Mild PNA or COPD on cxr. Dr. Regis Bill ordered PFT. Reports long hx intermittent albuterol use as an adult. Reports has felt wheezy and has had cough with this. The R parasternal region hurts with coughing or deep breathing. She has mild SOB at times. No CP with activity, DOE, orthopnea,  Palpitations, hemoptysis, wt loss, fevers, swelling.   ROS: See pertinent positives and negatives per HPI.  Past Medical History:  Diagnosis Date  . ANEMIA-NOS 04/23/2008  . DEPRESSION 04/23/2008  . DJD (degenerative joint disease)   . Hyperplastic colon polyp    x2  . Hypertension   . HYPOTHYROIDISM 10/17/2007  . Osteoarthritis   . Palpitations    freq at night  . PONV (postoperative nausea and vomiting)    only after 1979 surgery  . RA (rheumatoid arthritis) ([place])    "problems in feet, hands, and knees"  . Tubulovillous adenoma of colon     Past Surgical History:  Procedure Laterality Date  . ABDOMINAL HYSTERECTOMY    . APPENDECTOMY    . BUNIONECTOMY  10/2009 right & 02/03/10 left  . COLONOSCOPY W/ POLYPECTOMY  2015  . HAMMER TOE SURGERY     "all toes have pins, done with bunionectomy"  . INGUINAL HERNIA REPAIR    . neck fusion     x3  . THYROIDECTOMY  2001   for nodules  . TOE AMPUTATION  1979   6th toe removed from each foot  . TOTAL KNEE ARTHROPLASTY Left 10/21/2014   Procedure: LEFT TOTAL KNEE ARTHROPLASTY;  Surgeon: [name], MD;  Location: [facility];  Service: Orthopedics;  Laterality: Left;    Family History  Problem Relation Age of Onset  . Colon cancer Mother   . Dementia Father   . Heart attack Maternal Grandmother   . Stroke Maternal Grandmother   . Diabetes Maternal Grandmother   . Heart disease  Maternal Grandmother   . Esophageal cancer Neg Hx   . Rectal cancer Neg Hx   . Stomach cancer Neg Hx   . Thyroid disease Maternal Grandmother     Social History   Social History  . Marital status: Married    Spouse name: N/A  . Number of children: 2  . Years of education: 14   Occupational History  .  [employer]   Social History Main Topics  . Smoking status: Former Smoker    Packs/day: 0.50    Years: 33.00    Quit date: 09/15/2011  . Smokeless tobacco: Never Used  . Alcohol use Yes     Comment: rare  . Drug use: No  . Sexual activity: Not Asked   Other Topics Concern  . 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 Married - 2006  1 son '92  1 dtr '94. Work - at [employer].     Current Outpatient Prescriptions:  .  amLODipine-benazepril (LOTREL) 5-10 MG capsule, Take 1 capsule by mouth daily., Disp: 30 capsule, Rfl: 3 .  levothyroxine (SYNTHROID, LEVOTHROID) 175 MCG tablet, TAKE 1 TABLET BY MOUTH ONCE DAILY BEFORE BREAKFAST, Disp: 30 tablet, Rfl: 0 .  albuterol (PROVENTIL HFA;VENTOLIN HFA) 108 (90 Base) MCG/ACT inhaler, Inhale 2  puffs into the lungs every 6 (six) hours as needed., Disp: 1 Inhaler, Rfl: 0 .  doxycycline (VIBRA-TABS) 100 MG tablet, Take 1 tablet (100 mg total) by mouth 2 (two) times daily., Disp: 10 tablet, Rfl: 0 .  predniSONE (DELTASONE) 20 MG tablet, Take 2 tablets (40 mg total) by mouth daily with breakfast., Disp: 8 tablet, Rfl: 0  EXAM:  Vitals:   12/26/16 1342  BP: 112/80  Pulse: 87  Temp: 98.2 F (36.8 C)    Body mass index is 37.51 kg/m.  GENERAL: vitals reviewed and listed above, alert, oriented, appears well hydrated and in no acute distress  HEENT: atraumatic, conjunttiva clear, no obvious abnormalities on inspection of external nose and ears  NECK: no obvious masses on inspection  LUNGS: clear to auscultation bilaterally, no wheezes, rales or rhonchi, good air movement  CV: HRRR, no peripheral  edema  MS: moves all extremities without noticeable abnormality  PSYCH: pleasant and cooperative, no obvious depression or anxiety  ASSESSMENT AND PLAN:  Discussed the following assessment and plan:  Cough - Plan: predniSONE (DELTASONE) 20 MG tablet, doxycycline (VIBRA-TABS) 100 MG tablet  Dyspnea, unspecified type - Plan: predniSONE (DELTASONE) 20 MG tablet, doxycycline (VIBRA-TABS) 100 MG tablet  Chest wall pain  Rheumatoid arthritis, involving unspecified site, unspecified rheumatoid factor presence ([place]) - Plan: Ambulatory referral to Rheumatology  -query COPD with exacerbation and chest wall pain - pain is reproducible on exam in parasternal cartilage -opted for trial prednisone and doxy -rx for alb per her request -advise her to call in 1 week if not better and will have her see pulmonologist if not improving and order ESE -PFT pending -she also requests referral to new rheumatologist as her rheumatologist is retiring -labs at f/u -Patient advised to return or notify a doctor immediately if symptoms worsen or persist or new concerns arise.  Patient Instructions  BEFORE YOU LEAVE: -follow up: 1 month  Take the prednisone and the doxycycline (antibiotic) as prescribed.  [place] the albuterol if needed.  I hope you are feeling better soon! Call me in a week to update [place] on how you are feeling. Seek care immediately if worsening, new concerns or you are not improving with treatment.      Colin Benton R., DO

## 2016-12-26 NOTE — Patient Instructions (Signed)
BEFORE YOU LEAVE: -follow up: 1 month  Take the prednisone and the doxycycline (antibiotic) as prescribed.  [place] the albuterol if needed.  I hope you are feeling better soon! Call me in a week to update [place] on how you are feeling. Seek care immediately if worsening, new concerns or you are not improving with treatment.

## 2017-01-23 ENCOUNTER — Telehealth: Payer: Self-pay | Admitting: *Deleted

## 2017-01-23 ENCOUNTER — Ambulatory Visit: Payer: Managed Care, Other (non HMO) | Admitting: Family Medicine

## 2017-01-23 NOTE — Telephone Encounter (Signed)
Patient was a no-show for today's visit.  I called the pt to check on her and she stated she is at work and will call back for an appt.

## 2017-01-23 NOTE — Progress Notes (Deleted)
HPI:  PMH RA (sees rheumatologist), depression, HTN, Hypothyroidism and morbid obesity. Recently had atypical CP with extensive work up. Reproducible pain and resp symptoms with ? Asthma hx. Treated with prednisone and doxy, alb prn. Neg CXR. Referred for PFTs by another provider. Reports. Denies. ROS: See pertinent positives and negatives per HPI.  Past Medical History:  Diagnosis Date  . ANEMIA-NOS 04/23/2008  . DEPRESSION 04/23/2008  . DJD (degenerative joint disease)   . Hyperplastic colon polyp    x2  . Hypertension   . HYPOTHYROIDISM 10/17/2007  . Osteoarthritis   . Palpitations    freq at night  . PONV (postoperative nausea and vomiting)    only after 1979 surgery  . RA (rheumatoid arthritis) ([place])    "problems in feet, hands, and knees"  . Tubulovillous adenoma of colon     Past Surgical History:  Procedure Laterality Date  . ABDOMINAL HYSTERECTOMY    . APPENDECTOMY    . BUNIONECTOMY  10/2009 right & 02/03/10 left  . COLONOSCOPY W/ POLYPECTOMY  2015  . HAMMER TOE SURGERY     "all toes have pins, done with bunionectomy"  . INGUINAL HERNIA REPAIR    . neck fusion     x3  . THYROIDECTOMY  2001   for nodules  . TOE AMPUTATION  1979   6th toe removed from each foot  . TOTAL KNEE ARTHROPLASTY Left 10/21/2014   Procedure: LEFT TOTAL KNEE ARTHROPLASTY;  Surgeon: [name], MD;  [facility];  Service: Orthopedics;  Laterality: Left;    Family History  Problem Relation Age of Onset  . Colon cancer Mother   . Dementia Father   . Heart attack Maternal Grandmother   . Stroke Maternal Grandmother   . Diabetes Maternal Grandmother   . Heart disease Maternal Grandmother   . Esophageal cancer Neg Hx   . Rectal cancer Neg Hx   . Stomach cancer Neg Hx   . Thyroid disease Maternal Grandmother     Social History   Social History  . Marital status: Married    Spouse name: N/A  . Number of children: 2  . Years of education: 14   Occupational History  .  [employer]   Social History Main Topics  . Smoking status: Former Smoker    Packs/day: 0.50    Years: 33.00    Quit date: 09/15/2011  . Smokeless tobacco: Never Used  . Alcohol use Yes     Comment: rare  . Drug use: No  . Sexual activity: Not on file   Other Topics Concern  . 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 Married - 2006  1 son '92  1 dtr '94. Work - at [employer].     Current Outpatient Prescriptions:  .  albuterol (PROVENTIL HFA;VENTOLIN HFA) 108 (90 Base) MCG/ACT inhaler, Inhale 2 puffs into the lungs every 6 (six) hours as needed., Disp: 1 Inhaler, Rfl: 0 .  amLODipine-benazepril (LOTREL) 5-10 MG capsule, Take 1 capsule by mouth daily., Disp: 30 capsule, Rfl: 3 .  doxycycline (VIBRA-TABS) 100 MG tablet, Take 1 tablet (100 mg total) by mouth 2 (two) times daily., Disp: 10 tablet, Rfl: 0 .  levothyroxine (SYNTHROID, LEVOTHROID) 175 MCG tablet, TAKE 1 TABLET BY MOUTH ONCE DAILY BEFORE BREAKFAST, Disp: 30 tablet, Rfl: 0 .  predniSONE (DELTASONE) 20 MG tablet, Take 2 tablets (40 mg total) by mouth daily with breakfast., Disp: 8 tablet,  Rfl: 0  EXAM:  There were no vitals filed for this visit.  There is no height or weight on file to calculate BMI.  GENERAL: vitals reviewed and listed above, alert, oriented, appears well hydrated and in no acute distress  HEENT: atraumatic, conjunttiva clear, no obvious abnormalities on inspection of external nose and ears  NECK: no obvious masses on inspection  LUNGS: clear to auscultation bilaterally, no wheezes, rales or rhonchi, good air movement  CV: HRRR, no peripheral edema  MS: moves all extremities without noticeable abnormality  PSYCH: pleasant and cooperative, no obvious depression or anxiety  ASSESSMENT AND PLAN:  Discussed the following assessment and plan:  No diagnosis found.  -Patient advised to return or notify a doctor immediately if symptoms worsen or persist or new  concerns arise.  There are no Patient Instructions on file for this visit.  Colin Benton R., DO

## 2017-03-13 ENCOUNTER — Other Ambulatory Visit: Payer: Self-pay | Admitting: Endocrinology

## 2017-03-14 ENCOUNTER — Telehealth: Payer: Self-pay | Admitting: Family Medicine

## 2017-03-14 MED ORDER — LEVOTHYROXINE SODIUM 175 MCG PO TABS: 175.0000 ug | ORAL_TABLET | Freq: Every morning | ORAL | 0 refills | Status: DC

## 2017-03-14 MED ORDER — AMLODIPINE BESY-BENAZEPRIL HCL 5-10 MG PO CAPS: 1.0000 | ORAL_CAPSULE | Freq: Every day | ORAL | 0 refills | Status: DC

## 2017-03-14 NOTE — Telephone Encounter (Signed)
Ok to refill meds x 30 days. Needs follow appt. For further refills. Thanks.

## 2017-03-14 NOTE — Telephone Encounter (Signed)
I called the pt and scheduled an appt for 6/28.  Patient is aware the refills were sent to her pharmacy.

## 2017-03-14 NOTE — Telephone Encounter (Signed)
Pt need new Rx for amlodipine benazepril and levothyroxine  Pharm:  [place], [place]    Pt is out of medications as of 03/12/17 pt is at work and will be able to come in Wednesday 6/27 or Thursday 6/28 to see Dr. Maudie Mercury.

## 2017-03-15 NOTE — Progress Notes (Deleted)
HPI:  Paula Jacobs is a pleasant 56 y.o. here for follow up. Chronic medical problems summarized below were reviewed for changes. Hx of poor compliance. ***. Denies CP, SOB, DOE, treatment intolerance or new symptoms. Due for labs S/p hysterectomy - update pap  HTN: -meds: amlodipine-benazapril  Morbid Obesity/HLD:  Hypothyroidism: -levothyroxine  Depression:  ? COPD: -had wheezing and chest discomfort eval in 4.2018 -advised PFT last visit, she did not do it  RA: -sees rheumatologist for management  ROS: See pertinent positives and negatives per HPI.  Past Medical History:  Diagnosis Date  . ANEMIA-NOS 04/23/2008  . DEPRESSION 04/23/2008  . DJD (degenerative joint disease)   . Hyperplastic colon polyp    x2  . Hypertension   . HYPOTHYROIDISM 10/17/2007  . Osteoarthritis   . Palpitations    freq at night  . PONV (postoperative nausea and vomiting)    only after 1979 surgery  . RA (rheumatoid arthritis) ([place])    "problems in feet, hands, and knees"  . Tubulovillous adenoma of colon     Past Surgical History:  Procedure Laterality Date  . ABDOMINAL HYSTERECTOMY    . APPENDECTOMY    . BUNIONECTOMY  10/2009 right & 02/03/10 left  . COLONOSCOPY W/ POLYPECTOMY  2015  . HAMMER TOE SURGERY     "all toes have pins, done with bunionectomy"  . INGUINAL HERNIA REPAIR    . neck fusion     x3  . THYROIDECTOMY  2001   for nodules  . TOE AMPUTATION  1979   6th toe removed from each foot  . TOTAL KNEE ARTHROPLASTY Left 10/21/2014   Procedure: LEFT TOTAL KNEE ARTHROPLASTY;  Surgeon: [name], MD;  Location: [facility];  Service: Orthopedics;  Laterality: Left;    Family History  Problem Relation Age of Onset  . Colon cancer Mother   . Dementia Father   . Heart attack Maternal Grandmother   . Stroke Maternal Grandmother   . Diabetes Maternal Grandmother   . Heart disease Maternal Grandmother   . Esophageal cancer Neg Hx   . Rectal cancer Neg Hx   . Stomach  cancer Neg Hx   . Thyroid disease Maternal Grandmother     Social History   Social History  . Marital status: Married    Spouse name: N/A  . Number of children: 2  . Years of education: 14   Occupational History  .  [employer]   Social History Main Topics  . Smoking status: Former Smoker    Packs/day: 0.50    Years: 33.00    Quit date: 09/15/2011  . Smokeless tobacco: Never Used  . Alcohol use Yes     Comment: rare  . Drug use: No  . Sexual activity: Not on file   Other Topics Concern  . 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 Married - 2006  1 son '92  1 dtr '94. Work - at [employer].     Current Outpatient Prescriptions:  .  albuterol (PROVENTIL HFA;VENTOLIN HFA) 108 (90 Base) MCG/ACT inhaler, Inhale 2 puffs into the lungs every 6 (six) hours as needed., Disp: 1 Inhaler, Rfl: 0 .  amLODipine-benazepril (LOTREL) 5-10 MG capsule, Take 1 capsule by mouth daily., Disp: 30 capsule, Rfl: 0 .  doxycycline (VIBRA-TABS) 100 MG tablet, Take 1 tablet (100 mg total) by mouth 2 (two) times daily., Disp: 10 tablet, Rfl: 0 .  levothyroxine (SYNTHROID, LEVOTHROID)  175 MCG tablet, Take 1 tablet (175 mcg total) by mouth every morning., Disp: 30 tablet, Rfl: 0 .  predniSONE (DELTASONE) 20 MG tablet, Take 2 tablets (40 mg total) by mouth daily with breakfast., Disp: 8 tablet, Rfl: 0  EXAM:  There were no vitals filed for this visit.  There is no height or weight on file to calculate BMI.  GENERAL: vitals reviewed and listed above, alert, oriented, appears well hydrated and in no acute distress  HEENT: atraumatic, conjunttiva clear, no obvious abnormalities on inspection of external nose and ears  NECK: no obvious masses on inspection  LUNGS: clear to auscultation bilaterally, no wheezes, rales or rhonchi, good air movement  CV: HRRR, no peripheral edema  MS: moves all extremities without noticeable abnormality  PSYCH: pleasant and  cooperative, no obvious depression or anxiety  ASSESSMENT AND PLAN:  Discussed the following assessment and plan:  No diagnosis found.  -Patient advised to return or notify a doctor immediately if symptoms worsen or persist or new concerns arise.  There are no Patient Instructions on file for this visit.  Colin Benton R., DO

## 2017-03-16 ENCOUNTER — Ambulatory Visit: Payer: Managed Care, Other (non HMO) | Admitting: Family Medicine

## 2017-04-11 ENCOUNTER — Ambulatory Visit (INDEPENDENT_AMBULATORY_CARE_PROVIDER_SITE_OTHER): Payer: Managed Care, Other (non HMO) | Admitting: Family Medicine

## 2017-04-11 ENCOUNTER — Encounter: Payer: Self-pay | Admitting: Family Medicine

## 2017-04-11 VITALS — BP 122/82 | HR 75 | Temp 98.2°F | Ht 63.5 in | Wt 210.0 lb

## 2017-04-11 DIAGNOSIS — E890 Postprocedural hypothyroidism: Secondary | ICD-10-CM | POA: Diagnosis not present

## 2017-04-11 DIAGNOSIS — Z1389 Encounter for screening for other disorder: Secondary | ICD-10-CM

## 2017-04-11 DIAGNOSIS — I10 Essential (primary) hypertension: Secondary | ICD-10-CM

## 2017-04-11 DIAGNOSIS — I471 Supraventricular tachycardia, unspecified: Secondary | ICD-10-CM

## 2017-04-11 DIAGNOSIS — M069 Rheumatoid arthritis, unspecified: Secondary | ICD-10-CM

## 2017-04-11 DIAGNOSIS — Z6836 Body mass index (BMI) 36.0-36.9, adult: Secondary | ICD-10-CM

## 2017-04-11 DIAGNOSIS — Z1331 Encounter for screening for depression: Secondary | ICD-10-CM

## 2017-04-11 DIAGNOSIS — F3342 Major depressive disorder, recurrent, in full remission: Secondary | ICD-10-CM

## 2017-04-11 LAB — BASIC METABOLIC PANEL
BUN: 12 mg/dL (ref 6–23)
CALCIUM: 9.2 mg/dL (ref 8.4–10.5)
CHLORIDE: 107 meq/L (ref 96–112)
CO2: 30 meq/L (ref 19–32)
CREATININE: 0.67 mg/dL (ref 0.40–1.20)
GFR: 117.77 mL/min (ref >60.00)
GLUCOSE: 79 mg/dL (ref 70–99)
Potassium: 3.9 mEq/L (ref 3.5–5.1)
Sodium: 143 mEq/L (ref 135–145)

## 2017-04-11 LAB — CBC
HEMATOCRIT: 39.3 % (ref 36.0–46.0)
Hemoglobin: 12.5 g/dL (ref 12.0–15.0)
MCHC: 31.9 g/dL (ref 30.0–36.0)
MCV: 85.6 fl (ref 78.0–100.0)
Platelets: 384 10*3/uL (ref 150.0–400.0)
RBC: 4.59 Mil/uL (ref 3.87–5.11)
RDW: 13.4 % (ref 11.5–15.5)
WBC: 5.8 10*3/uL (ref 4.0–10.5)

## 2017-04-11 LAB — TSH: TSH: 0.56 u[IU]/mL (ref 0.35–4.50)

## 2017-04-11 LAB — HEMOGLOBIN A1C: HEMOGLOBIN A1C: 5.1 % (ref 4.6–6.5)

## 2017-04-11 NOTE — Patient Instructions (Addendum)
BEFORE YOU LEAVE: -phq9  -follow up: 3 months -labs  We have ordered labs or studies at this visit. It can take up to 1-2 weeks for results and processing. IF results require follow up or explanation, we will call you with instructions. Clinically stable results will be released to your [facility]. If you have not heard from [place] or cannot find your results in [facility] in 2 weeks please contact our office at [phone].  If you are not yet signed up for [facility], please consider signing up.  We recommend the following healthy lifestyle for LIFE: 1) Small portions. Regular healthy meals.  2) Eat a healthy clean diet.   TRY TO EAT: -at least 5-7 servings of low sugar vegetables per day (not corn, potatoes or bananas.) -berries are the best choice if you wish to eat fruit.   -lean meets (fish, chicken or [place] breasts) -vegan proteins for some meals - beans or tofu, whole grains, nuts and seeds -Replace bad fats with good fats - good fats include: fish, nuts and seeds, canola oil, olive oil -small amounts of low fat or non fat dairy -small amounts of100 % whole grains - check the lables  AVOID: -SUGAR, sweets, anything with added sugar, corn syrup or sweeteners -if you must have a sweetener, small amounts of stevia may be best -sweetened beverages -simple starches (rice, bread, potatoes, pasta, chips, etc - small amounts of 100% whole grains are ok) -red meat, pork, butter -fried foods, fast food, processed food, excessive dairy, eggs and coconut.  3)Get at least 150 minutes of sweaty aerobic exercise per week.  4)Reduce stress - consider counseling, meditation and relaxation to balance other aspects of your life.  WE NOW OFFER   [place] [facility] FAST TRACK!!!  SAME DAY Appointments for ACUTE CARE  Such as: Sprains, Injuries, cuts, abrasions, rashes, muscle pain, joint pain, back pain Colds, flu, sore throats, headache, allergies, cough, fever  Ear pain, sinus and eye  infections Abdominal pain, nausea, vomiting, diarrhea, upset stomach Animal/insect bites  3 Easy Ways to Schedule: Walk-In Scheduling Call in scheduling Mychart Sign-up: [email or web address]

## 2017-04-11 NOTE — Progress Notes (Signed)
HPI:  Follow up. PMH HTN, Hypothyroidism, Obesity, MDD, RA. Reports seeing new rheumatologist and doing ok. Struggles with wt loss. No regular exercise. Diet ok. Interested in healthy diet. No in for some time and is due for labs. Has some loss bowels at times. No Hematochezia, melena, unexplained wt loss, fevers, malaise, CP, SOB, DOE.  ROS: See pertinent positives and negatives per HPI.  Past Medical History:  Diagnosis Date  . ANEMIA-NOS 04/23/2008  . DEPRESSION 04/23/2008  . DJD (degenerative joint disease)   . Hyperplastic colon polyp    x2  . Hypertension   . HYPOTHYROIDISM 10/17/2007  . Osteoarthritis   . Palpitations    freq at night  . PONV (postoperative nausea and vomiting)    only after 1979 surgery  . RA (rheumatoid arthritis) ([place])    "problems in feet, hands, and knees"  . Tubulovillous adenoma of colon     Past Surgical History:  Procedure Laterality Date  . ABDOMINAL HYSTERECTOMY    . APPENDECTOMY    . BUNIONECTOMY  10/2009 right & 02/03/10 left  . COLONOSCOPY W/ POLYPECTOMY  2015  . HAMMER TOE SURGERY     "all toes have pins, done with bunionectomy"  . INGUINAL HERNIA REPAIR    . neck fusion     x3  . THYROIDECTOMY  2001   for nodules  . TOE AMPUTATION  1979   6th toe removed from each foot  . TOTAL KNEE ARTHROPLASTY Left 10/21/2014   Procedure: LEFT TOTAL KNEE ARTHROPLASTY;  Surgeon: [name], MD;  Location: [facility];  Service: Orthopedics;  Laterality: Left;    Family History  Problem Relation Age of Onset  . Colon cancer Mother   . Dementia Father   . Heart attack Maternal Grandmother   . Stroke Maternal Grandmother   . Diabetes Maternal Grandmother   . Heart disease Maternal Grandmother   . Esophageal cancer Neg Hx   . Rectal cancer Neg Hx   . Stomach cancer Neg Hx   . Thyroid disease Maternal Grandmother     Social History   Social History  . Marital status: Married    Spouse name: N/A  . Number of children: 2  . Years of education:  14   Occupational History  .  [employer]   Social History Main Topics  . Smoking status: Former Smoker    Packs/day: 0.50    Years: 33.00    Quit date: 09/15/2011  . Smokeless tobacco: Never Used  . Alcohol use Yes     Comment: rare  . Drug use: No  . Sexual activity: Not Asked   Other Topics Concern  . 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 Married - 2006  1 son '92  1 dtr '94. Work - at [employer].     Current Outpatient Prescriptions:  .  albuterol (PROVENTIL HFA;VENTOLIN HFA) 108 (90 Base) MCG/ACT inhaler, Inhale 2 puffs into the lungs every 6 (six) hours as needed., Disp: 1 Inhaler, Rfl: 0 .  amLODipine-benazepril (LOTREL) 5-10 MG capsule, Take 1 capsule by mouth daily., Disp: 30 capsule, Rfl: 0 .  levothyroxine (SYNTHROID, LEVOTHROID) 175 MCG tablet, Take 1 tablet (175 mcg total) by mouth every morning., Disp: 30 tablet, Rfl: 0  EXAM:  Vitals:   04/11/17 1250  BP: 122/82  Pulse: 75  Temp: 98.2 F (36.8 C)    Body mass index is 36.62 kg/m.  GENERAL: vitals reviewed and listed  above, alert, oriented, appears well hydrated and in no acute distress  HEENT: atraumatic, conjunttiva clear, no obvious abnormalities on inspection of external nose and ears  NECK: no obvious masses on inspection  LUNGS: clear to auscultation bilaterally, no wheezes, rales or rhonchi, good air movement  CV: HRRR, no peripheral edema  MS: moves all extremities without noticeable abnormality  PSYCH: pleasant and cooperative, no obvious depression or anxiety  ASSESSMENT AND PLAN:  Discussed the following assessment and plan:  Essential hypertension - Plan: Basic metabolic panel, CBC  Postoperative hypothyroidism - Plan: TSH  BMI 36.0-36.9,adult - Plan: Hemoglobin A1c  Rheumatoid arthritis, involving unspecified site, unspecified rheumatoid factor presence (HCC)  Recurrent major depressive disorder, in full remission ([place])  Morbid obesity  ([place]), Chronic  Paroxysmal supraventricular tachycardia ([place]), Chronic  -lifestyle recs discussed at length -labs today -cont management RA with rheumatologist -PHQ9 to assess mood -cont current medications -follow up 3 months -Patient advised to return or notify a doctor immediately if symptoms worsen or persist or new concerns arise.  Patient Instructions  BEFORE YOU LEAVE: -phq9  -follow up: 3 months -labs  We have ordered labs or studies at this visit. It can take up to 1-2 weeks for results and processing. IF results require follow up or explanation, we will call you with instructions. Clinically stable results will be released to your [facility]. If you have not heard from [place] or cannot find your results in [facility] in 2 weeks please contact our office at [phone].  If you are not yet signed up for [facility], please consider signing up.  We recommend the following healthy lifestyle for LIFE: 1) Small portions. Regular healthy meals.  2) Eat a healthy clean diet.   TRY TO EAT: -at least 5-7 servings of low sugar vegetables per day (not corn, potatoes or bananas.) -berries are the best choice if you wish to eat fruit.   -lean meets (fish, chicken or [place] breasts) -vegan proteins for some meals - beans or tofu, whole grains, nuts and seeds -Replace bad fats with good fats - good fats include: fish, nuts and seeds, canola oil, olive oil -small amounts of low fat or non fat dairy -small amounts of100 % whole grains - check the lables  AVOID: -SUGAR, sweets, anything with added sugar, corn syrup or sweeteners -if you must have a sweetener, small amounts of stevia may be best -sweetened beverages -simple starches (rice, bread, potatoes, pasta, chips, etc - small amounts of 100% whole grains are ok) -red meat, pork, butter -fried foods, fast food, processed food, excessive dairy, eggs and coconut.  3)Get at least 150 minutes of sweaty aerobic exercise per week.  4)Reduce  stress - consider counseling, meditation and relaxation to balance other aspects of your life.  WE NOW OFFER   [place] [facility] FAST TRACK!!!  SAME DAY Appointments for ACUTE CARE  Such as: Sprains, Injuries, cuts, abrasions, rashes, muscle pain, joint pain, back pain Colds, flu, sore throats, headache, allergies, cough, fever  Ear pain, sinus and eye infections Abdominal pain, nausea, vomiting, diarrhea, upset stomach Animal/insect bites  3 Easy Ways to Schedule: Walk-In Scheduling Call in scheduling Mychart Sign-up: [email or web address]           Colin Benton R., DO

## 2017-05-12 ENCOUNTER — Other Ambulatory Visit: Payer: Self-pay | Admitting: Family Medicine

## 2017-07-13 ENCOUNTER — Ambulatory Visit: Payer: Managed Care, Other (non HMO) | Admitting: Family Medicine

## 2017-07-13 DIAGNOSIS — Z0289 Encounter for other administrative examinations: Secondary | ICD-10-CM

## 2017-07-13 NOTE — Progress Notes (Deleted)
HPI:  Paula Jacobs is a pleasant 56 y.o. here for follow up. Chronic medical problems summarized below were reviewed for changes and stability and were updated as needed below. These issues and their treatment remain stable for the most part. ***. Denies CP, SOB, DOE, treatment intolerance or new symptoms. Due for flu shot and CPE ( mammogram, Pap smear)  HTN: -medications: amlodipine-benazepril ( Lotrel)  Hypothyroidism: -medications: Levothyroxine  MDD: -No medications  RA: -seeing rheumatologist for management  ROS: See pertinent positives and negatives per HPI.  Past Medical History:  Diagnosis Date  . ANEMIA-NOS 04/23/2008  . DEPRESSION 04/23/2008  . DJD (degenerative joint disease)   . Hyperplastic colon polyp    x2  . Hypertension   . HYPOTHYROIDISM 10/17/2007  . Osteoarthritis   . Palpitations    freq at night  . PONV (postoperative nausea and vomiting)    only after 1979 surgery  . RA (rheumatoid arthritis) ([place])    "problems in feet, hands, and knees"  . Tubulovillous adenoma of colon     Past Surgical History:  Procedure Laterality Date  . ABDOMINAL HYSTERECTOMY    . APPENDECTOMY    . BUNIONECTOMY  10/2009 right & 02/03/10 left  . COLONOSCOPY W/ POLYPECTOMY  2015  . HAMMER TOE SURGERY     "all toes have pins, done with bunionectomy"  . INGUINAL HERNIA REPAIR    . neck fusion     x3  . THYROIDECTOMY  2001   for nodules  . TOE AMPUTATION  1979   6th toe removed from each foot  . TOTAL KNEE ARTHROPLASTY Left 10/21/2014   Procedure: LEFT TOTAL KNEE ARTHROPLASTY;  Surgeon: [name], MD;  Location: [facility];  Service: Orthopedics;  Laterality: Left;    Family History  Problem Relation Age of Onset  . Colon cancer Mother   . Dementia Father   . Heart attack Maternal Grandmother   . Stroke Maternal Grandmother   . Diabetes Maternal Grandmother   . Heart disease Maternal Grandmother   . Esophageal cancer Neg Hx   . Rectal cancer Neg Hx   .  Stomach cancer Neg Hx   . Thyroid disease Maternal Grandmother     Social History   Social History  . Marital status: Married    Spouse name: N/A  . Number of children: 2  . Years of education: 14   Occupational History  .  [place]   Social History Main Topics  . Smoking status: Former Smoker    Packs/day: 0.50    Years: 33.00    Quit date: 09/15/2011  . Smokeless tobacco: Never Used  . Alcohol use Yes     Comment: rare  . Drug use: No  . Sexual activity: Not on file   Other Topics Concern  . 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 Married - 2006  1 son '92  1 dtr '94. Work - at [employer].     Current Outpatient Prescriptions:  .  albuterol (PROVENTIL HFA;VENTOLIN HFA) 108 (90 Base) MCG/ACT inhaler, Inhale 2 puffs into the lungs every 6 (six) hours as needed., Disp: 1 Inhaler, Rfl: 0 .  amLODipine-benazepril (LOTREL) 5-10 MG capsule, TAKE ONE CAPSULE BY MOUTH DAILY, Disp: 30 capsule, Rfl: 5 .  levothyroxine (SYNTHROID, LEVOTHROID) 175 MCG tablet, Take 1 tablet (175 mcg total) by mouth every morning., Disp: 30 tablet, Rfl: 0  EXAM:  There were no vitals  filed for this visit.  There is no height or weight on file to calculate BMI.  GENERAL: vitals reviewed and listed above, alert, oriented, appears well hydrated and in no acute distress  HEENT: atraumatic, conjunttiva clear, no obvious abnormalities on inspection of external nose and ears  NECK: no obvious masses on inspection  LUNGS: clear to auscultation bilaterally, no wheezes, rales or rhonchi, good air movement  CV: HRRR, no peripheral edema  MS: moves all extremities without noticeable abnormality  PSYCH: pleasant and cooperative, no obvious depression or anxiety  ASSESSMENT AND PLAN:  Discussed the following assessment and plan:  No diagnosis found.  -Patient advised to return or notify a doctor immediately if symptoms worsen or persist or new concerns  arise.  There are no Patient Instructions on file for this visit.  Colin Benton R., DO

## 2017-07-21 ENCOUNTER — Other Ambulatory Visit: Payer: Self-pay | Admitting: Family Medicine

## 2017-09-04 NOTE — Progress Notes (Signed)
HPI:  Due for mammo, pap smear, flu shot, PHQ9 Declines flu shot Agrees to schedule mammogram and Pap smear with her gynecologist  HTN/Obesity: -meds: amlodipine-benazapril -Getting regular exercise, diet is poor with lots of simple starches and sugar -She rustrated that she is not losing weight  Hypothyroidism: -meds: levothyroxine  MDD, recurrent: -meds: none -mild symptoms last visit -today reports: Worsening for the last several months, see PHQ 9 for symptoms -She has never been on treatment, but wants to consider cognitive behavioral therapy and medication -Has a history of anxiety and panic attacks in the past, never took medication, never hospitalized -Denies any suicidal ideation, thoughts of self-harm or manic symptoms   RA: -sees rheumatologist for management  ROS: See pertinent positives and negatives per HPI.  Past Medical History:  Diagnosis Date  . ANEMIA-NOS 04/23/2008  . DEPRESSION 04/23/2008  . DJD (degenerative joint disease)   . Hyperplastic colon polyp    x2  . Hypertension   . HYPOTHYROIDISM 10/17/2007  . Osteoarthritis   . Palpitations    freq at night  . PONV (postoperative nausea and vomiting)    only after 1979 surgery  . RA (rheumatoid arthritis) ([place])    "problems in feet, hands, and knees"  . Tubulovillous adenoma of colon     Past Surgical History:  Procedure Laterality Date  . ABDOMINAL HYSTERECTOMY    . APPENDECTOMY    . BUNIONECTOMY  10/2009 right & 02/03/10 left  . COLONOSCOPY W/ POLYPECTOMY  2015  . HAMMER TOE SURGERY     "all toes have pins, done with bunionectomy"  . INGUINAL HERNIA REPAIR    . neck fusion     x3  . THYROIDECTOMY  2001   for nodules  . TOE AMPUTATION  1979   6th toe removed from each foot  . TOTAL KNEE ARTHROPLASTY Left 10/21/2014   Procedure: LEFT TOTAL KNEE ARTHROPLASTY;  Surgeon: [name], MD;  Location: [facility];  Service: Orthopedics;  Laterality: Left;    Family History  Problem Relation Age of  Onset  . Colon cancer Mother   . Dementia Father   . Heart attack Maternal Grandmother   . Stroke Maternal Grandmother   . Diabetes Maternal Grandmother   . Heart disease Maternal Grandmother   . Esophageal cancer Neg Hx   . Rectal cancer Neg Hx   . Stomach cancer Neg Hx   . Thyroid disease Maternal Grandmother     Social History   Socioeconomic History  . Marital status: Married    Spouse name: None  . Number of children: 2  . Years of education: 69  . Highest education level: None  Social Needs  . Financial resource strain: None  . Food insecurity - worry: None  . Food insecurity - inability: None  . Transportation needs - medical: None  . Transportation needs - non-medical: None  Occupational History    Employer: [employer]  Tobacco Use  . Smoking status: Former Smoker    Packs/day: 0.50    Years: 33.00    Pack years: 16.50    Last attempt to quit: 09/15/2011    Years since quitting: 5.9  . Smokeless tobacco: Never Used  Substance and Sexual Activity  . Alcohol use: Yes    Comment: rare  . Drug use: No  . Sexual activity: None  Other Topics Concern  . 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 Married - 2006  1 son '92  1  dtr '94. Work - at [employer].     Current Outpatient Medications:  .  albuterol (PROVENTIL HFA;VENTOLIN HFA) 108 (90 Base) MCG/ACT inhaler, Inhale 2 puffs into the lungs every 6 (six) hours as needed., Disp: 1 Inhaler, Rfl: 0 .  amLODipine-benazepril (LOTREL) 5-10 MG capsule, Take 1 capsule by mouth daily., Disp: 30 capsule, Rfl: 5 .  levothyroxine (SYNTHROID, LEVOTHROID) 175 MCG tablet, TAKE 1 TABLET BY MOUTH EVERY MORNING, Disp: 30 tablet, Rfl: 0 .  venlafaxine XR (EFFEXOR XR) 37.5 MG 24 hr capsule, Take 1 capsule (37.5 mg total) by mouth daily with breakfast., Disp: 30 capsule, Rfl: 2  EXAM:  Vitals:   09/05/17 1352  BP: 110/80  Pulse: 90  Temp: 98.7 F (37.1 C)    Body mass index is 38.03  kg/m.  GENERAL: vitals reviewed and listed above, alert, oriented, appears well hydrated and in no acute distress  HEENT: atraumatic, conjunttiva clear, no obvious abnormalities on inspection of external nose and ears  NECK: no obvious masses on inspection  LUNGS: clear to auscultation bilaterally, no wheezes, rales or rhonchi, good air movement  CV: HRRR, no peripheral edema  MS: moves all extremities without noticeable abnormality  PSYCH: pleasant and cooperative, no obvious depression or anxiety  ASSESSMENT AND PLAN:  Discussed the following assessment and plan:  Moderate episode of recurrent major depressive disorder (HCC)  Morbid obesity ([place])  Essential hypertension - Plan: Basic metabolic panel, CBC  Hypothyroidism, unspecified type - Plan: TSH  -Discussed treatment options for depression > 50%15 minutes spent face to face in counseling: Brochure provided to schedule cognitive behavioral therapy, also discussed various medications and risk and she wants to try Effexor, sent rx, follow-up 1 month, sooner as needed, contract for safety verbal -Lifestyle recommendations discussed at length, advised healthy low sugar diet, continue regular exercise -Labs per orders -Patient advised to return or notify a doctor immediately if symptoms worsen or persist or new concerns arise.  Patient Instructions  BEFORE YOU LEAVE: -labs -follow up: 1 month   START the new medication Effexor and according to instructions.  Call the number provided in the brochure to schedule cognitive behavioral therapy.  We have ordered labs or studies at this visit. It can take up to 1-2 weeks for results and processing. IF results require follow up or explanation, we will call you with instructions. Clinically stable results will be released to your [facility]. If you have not heard from [place] or cannot find your results in [facility] in 2 weeks please contact our office at [phone].  If you are not yet  signed up for [facility], please consider signing up.   We recommend the following healthy lifestyle for LIFE: 1) Small portions. But, make sure to get regular (at least 3 per day), healthy meals and small healthy snacks if needed.  2) Eat a healthy clean diet.   TRY TO EAT: -at least 5-7 servings of low sugar, colorful, and nutrient rich vegetables per day (not corn, potatoes or bananas.) -berries are the best choice if you wish to eat fruit (only eat small amounts if trying to reduce weight)  -lean meets (fish, white meat of chicken or [place]) -vegan proteins for some meals - beans or tofu, whole grains, nuts and seeds -Replace bad fats with good fats - good fats include: fish, nuts and seeds, canola oil, olive oil -small amounts of low fat or non fat dairy -small amounts of100 % whole grains - check the lables -drink plenty  of water  AVOID: -SUGAR, sweets, anything with added sugar, corn syrup or sweeteners - must read labels as even foods advertised as "healthy" often are loaded with sugar -if you must have a sweetener, small amounts of stevia may be best -sweetened beverages and artificially sweetened beverages -simple starches (rice, bread, potatoes, pasta, chips, etc - small amounts of 100% whole grains are ok) -red meat, pork, butter -fried foods, fast food, processed food, excessive dairy, eggs and coconut.  3)Get at least 150 minutes of sweaty aerobic exercise per week.  4)Reduce stress - consider counseling, meditation and relaxation to balance other aspects of your life.          Colin Benton R., DO

## 2017-09-05 ENCOUNTER — Ambulatory Visit (INDEPENDENT_AMBULATORY_CARE_PROVIDER_SITE_OTHER): Payer: Managed Care, Other (non HMO) | Admitting: Family Medicine

## 2017-09-05 ENCOUNTER — Encounter: Payer: Self-pay | Admitting: Family Medicine

## 2017-09-05 VITALS — BP 110/80 | HR 90 | Temp 98.7°F | Ht 63.5 in | Wt 218.1 lb

## 2017-09-05 DIAGNOSIS — E039 Hypothyroidism, unspecified: Secondary | ICD-10-CM | POA: Diagnosis not present

## 2017-09-05 DIAGNOSIS — I10 Essential (primary) hypertension: Secondary | ICD-10-CM | POA: Diagnosis not present

## 2017-09-05 DIAGNOSIS — F331 Major depressive disorder, recurrent, moderate: Secondary | ICD-10-CM | POA: Diagnosis not present

## 2017-09-05 DIAGNOSIS — R7989 Other specified abnormal findings of blood chemistry: Secondary | ICD-10-CM | POA: Diagnosis not present

## 2017-09-05 LAB — CBC
HEMATOCRIT: 40.2 % (ref 36.0–46.0)
Hemoglobin: 13 g/dL (ref 12.0–15.0)
MCHC: 32.2 g/dL (ref 30.0–36.0)
MCV: 86.9 fl (ref 78.0–100.0)
Platelets: 411 10*3/uL — ABNORMAL HIGH (ref 150.0–400.0)
RBC: 4.63 Mil/uL (ref 3.87–5.11)
RDW: 13.9 % (ref 11.5–15.5)
WBC: 8.5 10*3/uL (ref 4.0–10.5)

## 2017-09-05 LAB — TSH: TSH: 1.24 u[IU]/mL (ref 0.35–4.50)

## 2017-09-05 LAB — BASIC METABOLIC PANEL
BUN: 17 mg/dL (ref 6–23)
CALCIUM: 8.6 mg/dL (ref 8.4–10.5)
CHLORIDE: 106 meq/L (ref 96–112)
CO2: 26 mEq/L (ref 19–32)
CREATININE: 0.75 mg/dL (ref 0.40–1.20)
GFR: 103.24 mL/min (ref >60.00)
Glucose, Bld: 85 mg/dL (ref 70–99)
Potassium: 3.9 mEq/L (ref 3.5–5.1)
Sodium: 141 mEq/L (ref 135–145)

## 2017-09-05 MED ORDER — AMLODIPINE BESY-BENAZEPRIL HCL 5-10 MG PO CAPS: 1.0000 | ORAL_CAPSULE | Freq: Every day | ORAL | 5 refills | Status: DC

## 2017-09-05 MED ORDER — VENLAFAXINE HCL ER 37.5 MG PO CP24: 37.5000 mg | ORAL_CAPSULE | Freq: Every day | ORAL | 2 refills | Status: DC

## 2017-09-05 NOTE — Patient Instructions (Signed)
BEFORE YOU LEAVE: -labs -follow up: 1 month   START the new medication Effexor and according to instructions.  Call the number provided in the brochure to schedule cognitive behavioral therapy.  We have ordered labs or studies at this visit. It can take up to 1-2 weeks for results and processing. IF results require follow up or explanation, we will call you with instructions. Clinically stable results will be released to your [facility]. If you have not heard from [place] or cannot find your results in [facility] in 2 weeks please contact our office at [phone].  If you are not yet signed up for [facility], please consider signing up.   We recommend the following healthy lifestyle for LIFE: 1) Small portions. But, make sure to get regular (at least 3 per day), healthy meals and small healthy snacks if needed.  2) Eat a healthy clean diet.   TRY TO EAT: -at least 5-7 servings of low sugar, colorful, and nutrient rich vegetables per day (not corn, potatoes or bananas.) -berries are the best choice if you wish to eat fruit (only eat small amounts if trying to reduce weight)  -lean meets (fish, white meat of chicken or [place]) -vegan proteins for some meals - beans or tofu, whole grains, nuts and seeds -Replace bad fats with good fats - good fats include: fish, nuts and seeds, canola oil, olive oil -small amounts of low fat or non fat dairy -small amounts of100 % whole grains - check the lables -drink plenty of water  AVOID: -SUGAR, sweets, anything with added sugar, corn syrup or sweeteners - must read labels as even foods advertised as "healthy" often are loaded with sugar -if you must have a sweetener, small amounts of stevia may be best -sweetened beverages and artificially sweetened beverages -simple starches (rice, bread, potatoes, pasta, chips, etc - small amounts of 100% whole grains are ok) -red meat, pork, butter -fried foods, fast food, processed food, excessive dairy, eggs and  coconut.  3)Get at least 150 minutes of sweaty aerobic exercise per week.  4)Reduce stress - consider counseling, meditation and relaxation to balance other aspects of your life.

## 2017-09-08 ENCOUNTER — Telehealth: Payer: Self-pay | Admitting: Family Medicine

## 2017-09-08 ENCOUNTER — Other Ambulatory Visit: Payer: Self-pay

## 2017-09-08 MED ORDER — LEVOTHYROXINE SODIUM 175 MCG PO TABS: 175.0000 ug | ORAL_TABLET | Freq: Every morning | ORAL | 0 refills | Status: DC

## 2017-09-08 NOTE — Addendum Note (Signed)
Addended by: Agnes Lawrence on: 09/08/2017 10:31 AM   Modules accepted: Orders

## 2017-09-08 NOTE — Telephone Encounter (Signed)
Copied from [place] [id number]. Topic: Quick Communication - See Telephone Encounter >> Sep 08, 2017 10:33 AM Ether Griffins B wrote: CRM for notification. See Telephone encounter for:  Pt needing levothyroxine refilled was seen in office 09/05/17 09/08/17.

## 2017-09-15 ENCOUNTER — Other Ambulatory Visit: Payer: Self-pay | Admitting: Family Medicine

## 2017-09-19 DIAGNOSIS — C801 Malignant (primary) neoplasm, unspecified: Secondary | ICD-10-CM

## 2017-09-19 HISTORY — DX: Malignant (primary) neoplasm, unspecified: C80.1

## 2017-09-26 NOTE — Progress Notes (Signed)
Sorry ignore previous note

## 2017-09-26 NOTE — Progress Notes (Signed)
Need orders in epic for 10-24-17 surgery

## 2017-10-10 ENCOUNTER — Ambulatory Visit: Payer: Managed Care, Other (non HMO) | Admitting: Family Medicine

## 2017-10-11 NOTE — Progress Notes (Signed)
HPI:  Follow up MDD, recurrent: -started Effexor 08/2017 and advised CBT -reports: doing much better on the effexor, but still several days per week feel down, anhedonia, irritability -denies: SI, thoughts of self harm -see PHQ9  ROS: See pertinent positives and negatives per HPI.  Past Medical History:  Diagnosis Date  . ANEMIA-NOS 04/23/2008  . DEPRESSION 04/23/2008  . DJD (degenerative joint disease)   . Hyperplastic colon polyp    x2  . Hypertension   . HYPOTHYROIDISM 10/17/2007  . Osteoarthritis   . Palpitations    freq at night  . PONV (postoperative nausea and vomiting)    only after 1979 surgery  . RA (rheumatoid arthritis) ([place])    "problems in feet, hands, and knees"  . Tubulovillous adenoma of colon     Past Surgical History:  Procedure Laterality Date  . ABDOMINAL HYSTERECTOMY    . APPENDECTOMY    . BUNIONECTOMY  10/2009 right & 02/03/10 left  . COLONOSCOPY W/ POLYPECTOMY  2015  . HAMMER TOE SURGERY     "all toes have pins, done with bunionectomy"  . INGUINAL HERNIA REPAIR    . neck fusion     x3  . THYROIDECTOMY  2001   for nodules  . TOE AMPUTATION  1979   6th toe removed from each foot  . TOTAL KNEE ARTHROPLASTY Left 10/21/2014   Procedure: LEFT TOTAL KNEE ARTHROPLASTY;  Surgeon: [name], MD;  Location: [facility];  Service: Orthopedics;  Laterality: Left;    Family History  Problem Relation Age of Onset  . Colon cancer Mother   . Dementia Father   . Heart attack Maternal Grandmother   . Stroke Maternal Grandmother   . Diabetes Maternal Grandmother   . Heart disease Maternal Grandmother   . Esophageal cancer Neg Hx   . Rectal cancer Neg Hx   . Stomach cancer Neg Hx   . Thyroid disease Maternal Grandmother     Social History   Socioeconomic History  . Marital status: Married    Spouse name: None  . Number of children: 2  . Years of education: 54  . Highest education level: None  Social Needs  . Financial resource strain: None  . Food  insecurity - worry: None  . Food insecurity - inability: None  . Transportation needs - medical: None  . Transportation needs - non-medical: None  Occupational History    Employer: [employer]  Tobacco Use  . Smoking status: Former Smoker    Packs/day: 0.50    Years: 33.00    Pack years: 16.50    Last attempt to quit: 09/15/2011    Years since quitting: 6.0  . Smokeless tobacco: Never Used  Substance and Sexual Activity  . Alcohol use: Yes    Comment: rare  . Drug use: No  . Sexual activity: None  Other Topics Concern  . 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 Married - 2006  1 son '92  1 dtr '94. Work - at [employer].     Current Outpatient Medications:  .  albuterol (PROVENTIL HFA;VENTOLIN HFA) 108 (90 Base) MCG/ACT inhaler, Inhale 2 puffs into the lungs every 6 (six) hours as needed. (Patient taking differently: Inhale 2 puffs into the lungs every 6 (six) hours as needed for wheezing or shortness of breath. ), Disp: 1 Inhaler, Rfl: 0 .  amLODipine-benazepril (LOTREL) 5-10 MG capsule, Take 1 capsule by mouth daily., Disp: 30 capsule, Rfl: 5 .  diphenhydrAMINE (BENADRYL) 25 MG tablet, Take 25 mg by mouth daily as needed for itching., Disp: , Rfl:  .  ibuprofen (ADVIL,MOTRIN) 200 MG tablet, Take 400 mg by mouth 2 (two) times daily as needed for moderate pain., Disp: , Rfl:  .  levothyroxine (SYNTHROID, LEVOTHROID) 175 MCG tablet, Take 1 tablet (175 mcg total) by mouth every morning. Pt. Needs to schedule an appointment., Disp: 30 tablet, Rfl: 0 .  venlafaxine XR (EFFEXOR-XR) 75 MG 24 hr capsule, Take 1 capsule (75 mg total) by mouth daily with breakfast., Disp: 30 capsule, Rfl: 3  EXAM:  Vitals:   10/12/17 1615  BP: 136/82  Pulse: 84  Temp: 98.2 F (36.8 C)    Body mass index is 38.39 kg/m.  GENERAL: vitals reviewed and listed above, alert, oriented, appears well hydrated and in no acute distress  HEENT: atraumatic, conjunttiva clear,  no obvious abnormalities on inspection of external nose and ears  NECK: no obvious masses on inspection  LUNGS: clear to auscultation bilaterally, no wheezes, rales or rhonchi, good air movement  CV: HRRR, no peripheral edema  MS: moves all extremities without noticeable abnormality  PSYCH: pleasant and cooperative, no obvious depression or anxiety  ASSESSMENT AND PLAN:  Discussed the following assessment and plan:  No diagnosis found.  -Cost options and decided to increase the Effexor to 75 mg daily -Would like to do cognitive behavioral therapy, but does not have time currently -She requests to schedule a preoperative evaluation for knee surgery, will have my assistant this -He requests her flu shot today -Patient advised to return or notify a doctor immediately if symptoms worsen or persist or new concerns arise.  Patient Instructions  BEFORE YOU LEAVE: -flu shot -PHQ9 -follow up: for pre op appointment Also 3 month follow up.  Increase Effexor to 75 mg daily.  Do the counseling if you are able.  Follow up sooner if any worsening or new concerns.    [name], DO

## 2017-10-12 ENCOUNTER — Ambulatory Visit (INDEPENDENT_AMBULATORY_CARE_PROVIDER_SITE_OTHER): Payer: Managed Care, Other (non HMO) | Admitting: Family Medicine

## 2017-10-12 ENCOUNTER — Encounter: Payer: Self-pay | Admitting: Family Medicine

## 2017-10-12 VITALS — BP 136/82 | HR 84 | Temp 98.2°F | Ht 63.5 in | Wt 220.2 lb

## 2017-10-12 DIAGNOSIS — F3342 Major depressive disorder, recurrent, in full remission: Secondary | ICD-10-CM | POA: Diagnosis not present

## 2017-10-12 DIAGNOSIS — Z23 Encounter for immunization: Secondary | ICD-10-CM | POA: Diagnosis not present

## 2017-10-12 MED ORDER — VENLAFAXINE HCL ER 75 MG PO CP24: 75.0000 mg | ORAL_CAPSULE | Freq: Every day | ORAL | 3 refills | Status: DC

## 2017-10-12 NOTE — Patient Instructions (Signed)
BEFORE YOU LEAVE: -flu shot -PHQ9 -follow up: for pre op appointment Also 3 month follow up.  Increase Effexor to 75 mg daily.  Do the counseling if you are able.  Follow up sooner if any worsening or new concerns.

## 2017-10-12 NOTE — Addendum Note (Signed)
Addended by: Agnes Lawrence on: 10/12/2017 05:20 PM   Modules accepted: Orders

## 2017-10-13 NOTE — H&P (Signed)
TOTAL KNEE ADMISSION H&P  Patient is being admitted for right total knee arthroplasty.  Subjective:  Chief Complaint:   Right knee primary OA / pain  HPI: Paula Jacobs, 57 y.o. female, has a history of pain and functional disability in the right knee due to arthritis and has failed non-surgical conservative treatments for greater than 12 weeks to include NSAID's and/or analgesics, corticosteriod injections and activity modification.  Onset of symptoms was gradual, starting ~1.5 years ago with gradually worsening course since that time. The patient noted prior procedures on the knee to include  arthroplasty on the left knee(s).  Patient currently rates pain in the right knee(s) at 8 out of 10 with activity. Patient has night pain, worsening of pain with activity and weight bearing, pain that interferes with activities of daily living, pain with passive range of motion, crepitus and joint swelling.  Patient has evidence of periarticular osteophytes and joint space narrowing by imaging studies.  There is no active infection.   Risks, benefits and expectations were discussed with the patient.  Risks including but not limited to the risk of anesthesia, blood clots, nerve damage, blood vessel damage, failure of the prosthesis, infection and up to and including death.  Patient understand the risks, benefits and expectations and wishes to proceed with surgery.   PCP: [name], DO  D/C Plans:       Home   Post-op Meds:       No Rx given   Tranexamic Acid:      To be given - IV   Decadron:      Is to be given  FYI:      ASA  Norco  DME:   Rx given for - RW   PT:   OPPT Rx given  Patient Active Problem List   Diagnosis Date Noted  . Moderate episode of recurrent major depressive disorder ([place]) 09/05/2017  . Recurrent major depressive disorder, in full remission ([place]) 09/16/2016  . Morbid obesity ([place]) 10/22/2014  . 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 S/P knee replacement 10/21/2014  .  Rheumatoid arthritis ([place]) 09/26/2013  . Hypertension 01/31/2011  . PAT 10/27/2009  . Hypothyroidism 10/17/2007   Past Medical History:  Diagnosis Date  . ANEMIA-NOS 04/23/2008  . DEPRESSION 04/23/2008  . DJD (degenerative joint disease)   . Hyperplastic colon polyp    x2  . Hypertension   . HYPOTHYROIDISM 10/17/2007  . Osteoarthritis   . Palpitations    freq at night  . PONV (postoperative nausea and vomiting)    only after 1979 surgery  . RA (rheumatoid arthritis) ([place])    "problems in feet, hands, and knees"  . Tubulovillous adenoma of colon     Past Surgical History:  Procedure Laterality Date  . ABDOMINAL HYSTERECTOMY    . APPENDECTOMY    . BUNIONECTOMY  10/2009 right & 02/03/10 left  . COLONOSCOPY W/ POLYPECTOMY  2015  . HAMMER TOE SURGERY     "all toes have pins, done with bunionectomy"  . INGUINAL HERNIA REPAIR    . neck fusion     x3  . THYROIDECTOMY  2001   for nodules  . TOE AMPUTATION  1979   6th toe removed from each foot  . TOTAL KNEE ARTHROPLASTY Left 10/21/2014   Procedure: LEFT TOTAL KNEE ARTHROPLASTY;  Surgeon: [name], MD;  Location: [facility];  Service: Orthopedics;  Laterality: Left;    No current facility-administered medications for this encounter.  Current Outpatient Medications  Medication Sig Dispense Refill Last Dose  . albuterol (PROVENTIL HFA;VENTOLIN HFA) 108 (90 Base) MCG/ACT inhaler Inhale 2 puffs into the lungs every 6 (six) hours as needed. (Patient taking differently: Inhale 2 puffs into the lungs every 6 (six) hours as needed for wheezing or shortness of breath. ) 1 Inhaler 0 Taking  . amLODipine-benazepril (LOTREL) 5-10 MG capsule Take 1 capsule by mouth daily. 30 capsule 5 Taking  . diphenhydrAMINE (BENADRYL) 25 MG tablet Take 25 mg by mouth daily as needed for itching.   Taking  . ibuprofen (ADVIL,MOTRIN) 200 MG tablet Take 400 mg by mouth 2 (two) times daily as needed for moderate pain.   Taking  . levothyroxine (SYNTHROID,  LEVOTHROID) 175 MCG tablet Take 1 tablet (175 mcg total) by mouth every morning. Pt. Needs to schedule an appointment. 30 tablet 0 Taking  . venlafaxine XR (EFFEXOR-XR) 75 MG 24 hr capsule Take 1 capsule (75 mg total) by mouth daily with breakfast. 30 capsule 3    Allergies  Allergen Reactions  . Hydrocodone-Acetaminophen Itching  . Latex Itching  . Tramadol Hcl Itching and Nausea Only    Social History   Tobacco Use  . Smoking status: Former Smoker    Packs/day: 0.50    Years: 33.00    Pack years: 16.50    Last attempt to quit: 09/15/2011    Years since quitting: 6.0  . Smokeless tobacco: Never Used  Substance Use Topics  . Alcohol use: Yes    Comment: rare    Family History  Problem Relation Age of Onset  . Colon cancer Mother   . Dementia Father   . Heart attack Maternal Grandmother   . Stroke Maternal Grandmother   . Diabetes Maternal Grandmother   . Heart disease Maternal Grandmother   . Esophageal cancer Neg Hx   . Rectal cancer Neg Hx   . Stomach cancer Neg Hx   . Thyroid disease Maternal Grandmother      Review of Systems  Constitutional: Negative.   HENT: Negative.   Eyes: Negative.   Respiratory: Negative.   Cardiovascular: Negative.   Gastrointestinal: Negative.   Genitourinary: Negative.   Musculoskeletal: Positive for joint pain.  Skin: Negative.   Neurological: Negative.   Endo/Heme/Allergies: Negative.   Psychiatric/Behavioral: Positive for depression.    Objective:  Physical Exam  Constitutional: She is oriented to person, place, and time. She appears well-developed.  HENT:  Head: Normocephalic.  Eyes: Pupils are equal, round, and reactive to light.  Neck: Neck supple. No JVD present. No tracheal deviation present. No thyromegaly present.  Cardiovascular: Normal rate, regular rhythm and intact distal pulses.  Respiratory: Effort normal and breath sounds normal. No respiratory distress. She has no wheezes.  GI: Soft. There is no tenderness.  There is no guarding.  Musculoskeletal:       Right knee: She exhibits decreased range of motion, swelling and bony tenderness. She exhibits no ecchymosis, no deformity, no laceration and no erythema. Tenderness found.  Lymphadenopathy:    She has no cervical adenopathy.  Neurological: She is alert and oriented to person, place, and time.  Skin: Skin is warm and dry.  Psychiatric: She has a normal mood and affect.    Vital signs in last 24 hours: Temp:  [98.2 F (36.8 C)] 98.2 F (36.8 C) (01/24 1615) Pulse Rate:  [84] 84 (01/24 1615) BP: (136)/(82) 136/82 (01/24 1615) Weight:  [99.9 kg (220 lb 3.2 oz)] 99.9 kg (220 lb 3.2 oz) (  01/24 1615)  Labs:   Estimated body mass index is 38.39 kg/m as calculated from the following:   Height as of 10/12/17: 5' 3.5" (1.613 m).   Weight as of 10/12/17: 99.9 kg (220 lb 3.2 oz).   Imaging Review Plain radiographs demonstrate severe degenerative joint disease of the right knee.  The bone quality appears to be good for age and reported activity level.  Assessment/Plan:  End stage arthritis, right knee   The patient history, physical examination, clinical judgment of the provider and imaging studies are consistent with end stage degenerative joint disease of the right knee(s) and total knee arthroplasty is deemed medically necessary. The treatment options including medical management, injection therapy arthroscopy and arthroplasty were discussed at length. The risks and benefits of total knee arthroplasty were presented and reviewed. The risks due to aseptic loosening, infection, stiffness, patella tracking problems, thromboembolic complications and other imponderables were discussed. The patient acknowledged the explanation, agreed to proceed with the plan and consent was signed. Patient is being admitted for inpatient treatment for surgery, pain control, PT, OT, prophylactic antibiotics, VTE prophylaxis, progressive ambulation and ADL's and discharge  planning. The patient is planning to be discharged home.     [place] Shaquisha Wynn   PA-C  10/13/2017, 9:22 AM

## 2017-10-18 ENCOUNTER — Other Ambulatory Visit (HOSPITAL_COMMUNITY): Payer: Self-pay | Admitting: Emergency Medicine

## 2017-10-18 NOTE — Progress Notes (Signed)
EKG, last ED visit, and CXR  requested via fax from [place]

## 2017-10-18 NOTE — Patient Instructions (Signed)
Paula Jacobs  10/18/2017   Your procedure is scheduled on: 10-24-17   Report to [facility] Main  Entrance    Report to admitting at 7:30AM   Call this number if you have problems the morning of surgery [phone]     Remember: Do not eat food or drink liquids :After Midnight.     Take these medicines the morning of surgery with A SIP OF WATER: levothyroxine, venlafaxine , inhaler if needed                                You may not have any metal on your body including hair pins and              piercings  Do not wear jewelry, make-up, lotions, powders or perfumes, deodorant             Do not wear nail polish.  Do not shave  48 hours prior to surgery.     Do not bring valuables to the hospital. [place].  Contacts, dentures or bridgework may not be worn into surgery.  Leave suitcase in the car. After surgery it may be brought to your room.                   Please read over the following fact sheets you were given: _____________________________________________________________________             [facility] - Preparing for Surgery Before surgery, you can play an important role.  Because skin is not sterile, your skin needs to be as free of germs as possible.  You can reduce the number of germs on your skin by washing with CHG (chlorahexidine gluconate) soap before surgery.  CHG is an antiseptic cleaner which kills germs and bonds with the skin to continue killing germs even after washing. Please DO NOT use if you have an allergy to CHG or antibacterial soaps.  If your skin becomes reddened/irritated stop using the CHG and inform your nurse when you arrive at Short Stay. Do not shave (including legs and underarms) for at least 48 hours prior to the first CHG shower.  You may shave your face/neck. Please follow these instructions carefully:  1.  Shower with CHG Soap the night before surgery and  the  morning of Surgery.  2.  If you choose to wash your hair, wash your hair first as usual with your  normal  shampoo.  3.  After you shampoo, rinse your hair and body thoroughly to remove the  shampoo.                           4.  Use CHG as you would any other liquid soap.  You can apply chg directly  to the skin and wash                       Gently with a scrungie or clean washcloth.  5.  Apply the CHG Soap to your body ONLY FROM THE NECK DOWN.   Do not use on face/ open  Wound or open sores. Avoid contact with eyes, ears mouth and genitals (private parts).                       Wash face,  Genitals (private parts) with your normal soap.             6.  Wash thoroughly, paying special attention to the area where your surgery  will be performed.  7.  Thoroughly rinse your body with warm water from the neck down.  8.  DO NOT shower/wash with your normal soap after using and rinsing off  the CHG Soap.                9.  Pat yourself dry with a clean towel.            10.  Wear clean pajamas.            11.  Place clean sheets on your bed the night of your first shower and do not  sleep with pets. Day of Surgery : Do not apply any lotions/deodorants the morning of surgery.  Please wear clean clothes to the hospital/surgery center.  FAILURE TO FOLLOW THESE INSTRUCTIONS MAY RESULT IN THE CANCELLATION OF YOUR SURGERY PATIENT SIGNATURE_________________________________  NURSE SIGNATURE__________________________________  ________________________________________________________________________   Adam Phenix  An incentive spirometer is a tool that can help keep your lungs clear and active. This tool measures how well you are filling your lungs with each breath. Taking long deep breaths may help reverse or decrease the chance of developing breathing (pulmonary) problems (especially infection) following:  A long period of time when you are unable to move or be  active. BEFORE THE PROCEDURE   If the spirometer includes an indicator to show your best effort, your nurse or respiratory therapist will set it to a desired goal.  If possible, sit up straight or lean slightly forward. Try not to slouch.  Hold the incentive spirometer in an upright position. INSTRUCTIONS FOR USE  1. Sit on the edge of your bed if possible, or sit up as far as you can in bed or on a chair. 2. Hold the incentive spirometer in an upright position. 3. Breathe out normally. 4. Place the mouthpiece in your mouth and seal your lips tightly around it. 5. Breathe in slowly and as deeply as possible, raising the piston or the ball toward the top of the column. 6. Hold your breath for 3-5 seconds or for as long as possible. Allow the piston or ball to fall to the bottom of the column. 7. Remove the mouthpiece from your mouth and breathe out normally. 8. Rest for a few seconds and repeat Steps 1 through 7 at least 10 times every 1-2 hours when you are awake. Take your time and take a few normal breaths between deep breaths. 9. The spirometer may include an indicator to show your best effort. Use the indicator as a goal to work toward during each repetition. 10. After each set of 10 deep breaths, practice coughing to be sure your lungs are clear. If you have an incision (the cut made at the time of surgery), support your incision when coughing by placing a pillow or rolled up towels firmly against it. Once you are able to get out of bed, walk around indoors and cough well. You may stop using the incentive spirometer when instructed by your caregiver.  RISKS AND COMPLICATIONS  Take your time so you do not get  dizzy or light-headed.  If you are in pain, you may need to take or ask for pain medication before doing incentive spirometry. It is harder to take a deep breath if you are having pain. AFTER USE  Rest and breathe slowly and easily.  It can be helpful to keep track of a log of  your progress. Your caregiver can provide you with a simple table to help with this. If you are using the spirometer at home, follow these instructions: [place] IF:   You are having difficultly using the spirometer.  You have trouble using the spirometer as often as instructed.  Your pain medication is not giving enough relief while using the spirometer.  You develop fever of 100.5 F (38.1 C) or higher. SEEK IMMEDIATE MEDICAL CARE IF:   You cough up bloody sputum that had not been present before.  You develop fever of 102 F (38.9 C) or greater.  You develop worsening pain at or near the incision site. MAKE SURE YOU:   Understand these instructions.  Will watch your condition.  Will get help right away if you are not doing well or get worse. Document Released: 01/16/2007 Document Revised: 11/28/2011 Document Reviewed: 03/19/2007 Exit[employer] Patient Information 2014 [employer], [place].   ________________________________________________________________________  WHAT IS A BLOOD TRANSFUSION? Blood Transfusion Information  A transfusion is the replacement of blood or some of its parts. Blood is made up of multiple cells which provide different functions.  Red blood cells carry oxygen and are used for blood loss replacement.  White blood cells fight against infection.  Platelets control bleeding.  Plasma helps clot blood.  Other blood products are available for specialized needs, such as hemophilia or other clotting disorders. BEFORE THE TRANSFUSION  Who gives blood for transfusions?   Healthy volunteers who are fully evaluated to make sure their blood is safe. This is blood bank blood. Transfusion therapy is the safest it has ever been in the practice of medicine. Before blood is taken from a donor, a complete history is taken to make sure that person has no history of diseases nor engages in risky social behavior (examples are intravenous drug use or sexual activity  with multiple partners). The donor's travel history is screened to minimize risk of transmitting infections, such as malaria. The donated blood is tested for signs of infectious diseases, such as HIV and hepatitis. The blood is then tested to be sure it is compatible with you in order to minimize the chance of a transfusion reaction. If you or a relative donates blood, this is often done in anticipation of surgery and is not appropriate for emergency situations. It takes many days to process the donated blood. RISKS AND COMPLICATIONS Although transfusion therapy is very safe and saves many lives, the main dangers of transfusion include:   Getting an infectious disease.  Developing a transfusion reaction. This is an allergic reaction to something in the blood you were given. Every precaution is taken to prevent this. The decision to have a blood transfusion has been considered carefully by your caregiver before blood is given. Blood is not given unless the benefits outweigh the risks. AFTER THE TRANSFUSION  Right after receiving a blood transfusion, you will usually feel much better and more energetic. This is especially true if your red blood cells have gotten low (anemic). The transfusion raises the level of the red blood cells which carry oxygen, and this usually causes an energy increase.  The nurse administering the transfusion will  monitor you carefully for complications. HOME CARE INSTRUCTIONS  No special instructions are needed after a transfusion. You may find your energy is better. Speak with your caregiver about any limitations on activity for underlying diseases you may have. SEEK MEDICAL CARE IF:   Your condition is not improving after your transfusion.  You develop redness or irritation at the intravenous (IV) site. SEEK IMMEDIATE MEDICAL CARE IF:  Any of the following symptoms occur over the next 12 hours:  Shaking chills.  You have a temperature by mouth above 102 F (38.9  C), not controlled by medicine.  Chest, back, or muscle pain.  People around you feel you are not acting correctly or are confused.  Shortness of breath or difficulty breathing.  Dizziness and fainting.  You get a rash or develop hives.  You have a decrease in urine output.  Your urine turns a dark color or changes to pink, red, or brown. Any of the following symptoms occur over the next 10 days:  You have a temperature by mouth above 102 F (38.9 C), not controlled by medicine.  Shortness of breath.  Weakness after normal activity.  The white part of the eye turns yellow (jaundice).  You have a decrease in the amount of urine or are urinating less often.  Your urine turns a dark color or changes to pink, red, or brown. Document Released: 09/02/2000 Document Revised: 11/28/2011 Document Reviewed: 04/21/2008 [employer] Patient Information 2014 [place], [place].  _______________________________________________________________________

## 2017-10-19 ENCOUNTER — Ambulatory Visit (INDEPENDENT_AMBULATORY_CARE_PROVIDER_SITE_OTHER): Payer: Managed Care, Other (non HMO) | Admitting: Family Medicine

## 2017-10-19 ENCOUNTER — Encounter (HOSPITAL_COMMUNITY)
Admission: RE | Admit: 2017-10-19 | Discharge: 2017-10-19 | Disposition: A | Discharge status: Home / Self Care | Payer: Managed Care, Other (non HMO) | Source: Ambulatory Visit | Attending: Orthopedic Surgery | Admitting: Orthopedic Surgery

## 2017-10-19 ENCOUNTER — Encounter: Payer: Self-pay | Admitting: Family Medicine

## 2017-10-19 ENCOUNTER — Encounter: Payer: Self-pay | Admitting: *Deleted

## 2017-10-19 ENCOUNTER — Telehealth: Payer: Self-pay | Admitting: *Deleted

## 2017-10-19 ENCOUNTER — Encounter (HOSPITAL_COMMUNITY): Payer: Self-pay

## 2017-10-19 ENCOUNTER — Other Ambulatory Visit: Payer: Self-pay

## 2017-10-19 VITALS — BP 122/78 | HR 88 | Temp 98.0°F | Ht 62.0 in | Wt 221.0 lb

## 2017-10-19 DIAGNOSIS — E039 Hypothyroidism, unspecified: Secondary | ICD-10-CM | POA: Diagnosis not present

## 2017-10-19 DIAGNOSIS — J069 Acute upper respiratory infection, unspecified: Secondary | ICD-10-CM

## 2017-10-19 DIAGNOSIS — Z01810 Encounter for preprocedural cardiovascular examination: Secondary | ICD-10-CM | POA: Insufficient documentation

## 2017-10-19 DIAGNOSIS — Z01812 Encounter for preprocedural laboratory examination: Secondary | ICD-10-CM | POA: Diagnosis present

## 2017-10-19 DIAGNOSIS — G8929 Other chronic pain: Secondary | ICD-10-CM

## 2017-10-19 DIAGNOSIS — M25561 Pain in right knee: Secondary | ICD-10-CM

## 2017-10-19 DIAGNOSIS — M069 Rheumatoid arthritis, unspecified: Secondary | ICD-10-CM

## 2017-10-19 DIAGNOSIS — R9431 Abnormal electrocardiogram [ECG] [EKG]: Secondary | ICD-10-CM | POA: Diagnosis not present

## 2017-10-19 DIAGNOSIS — M1711 Unilateral primary osteoarthritis, right knee: Secondary | ICD-10-CM | POA: Diagnosis not present

## 2017-10-19 DIAGNOSIS — Z01818 Encounter for other preprocedural examination: Secondary | ICD-10-CM | POA: Diagnosis not present

## 2017-10-19 DIAGNOSIS — Z9889 Other specified postprocedural states: Secondary | ICD-10-CM

## 2017-10-19 DIAGNOSIS — I729 Aneurysm of unspecified site: Secondary | ICD-10-CM

## 2017-10-19 DIAGNOSIS — I10 Essential (primary) hypertension: Secondary | ICD-10-CM | POA: Insufficient documentation

## 2017-10-19 DIAGNOSIS — R002 Palpitations: Secondary | ICD-10-CM | POA: Diagnosis not present

## 2017-10-19 HISTORY — DX: Supraventricular tachycardia, unspecified: I47.10

## 2017-10-19 HISTORY — DX: Aneurysm of unspecified site: I72.9

## 2017-10-19 HISTORY — DX: Supraventricular tachycardia: I47.1

## 2017-10-19 HISTORY — DX: Angina pectoris, unspecified: I20.9

## 2017-10-19 LAB — BASIC METABOLIC PANEL
Anion gap: 5 (ref 5–15)
BUN: 12 mg/dL (ref 6–20)
CALCIUM: 9.1 mg/dL (ref 8.9–10.3)
CHLORIDE: 104 mmol/L (ref 101–111)
CO2: 29 mmol/L (ref 22–32)
CREATININE: 0.63 mg/dL (ref 0.44–1.00)
GFR calc non Af Amer: >60 mL/min (ref >60)
Glucose, Bld: 96 mg/dL (ref 65–99)
Potassium: 4.2 mmol/L (ref 3.5–5.1)
Sodium: 138 mmol/L (ref 135–145)

## 2017-10-19 LAB — CBC
HEMATOCRIT: 39.1 % (ref 36.0–46.0)
HEMOGLOBIN: 12.4 g/dL (ref 12.0–15.0)
MCH: 27.4 pg (ref 26.0–34.0)
MCHC: 31.7 g/dL (ref 30.0–36.0)
MCV: 86.5 fL (ref 78.0–100.0)
Platelets: 395 10*3/uL (ref 150–400)
RBC: 4.52 MIL/uL (ref 3.87–5.11)
RDW: 13.6 % (ref 11.5–15.5)
WBC: 7.5 10*3/uL (ref 4.0–10.5)

## 2017-10-19 LAB — SURGICAL PCR SCREEN
MRSA, PCR: NEGATIVE
STAPHYLOCOCCUS AUREUS: NEGATIVE

## 2017-10-19 MED ORDER — BENZONATATE 100 MG PO CAPS: 100.0000 mg | ORAL_CAPSULE | Freq: Three times a day (TID) | ORAL | 0 refills | Status: DC | PRN

## 2017-10-19 NOTE — Progress Notes (Signed)
Anesthesia consult done with Dr Andres Shad in regards to patients recent ED visit with c/o chest pain (see care everywhere visit dated 12-20-16), although no recurrence since. RN reviewed pertinent hx with [place] including today's EKG result, hx of palpitations with caffeine intake; etc Patient reports she has a 2:45 appt with PCP today for coughing she's been having and a test for her heart. Patient unaware what heart test they will be doing. Dr Valma Cava made aware of this. Dr Valma Cava questioned if patient experiences SOB with exertions and if so after how long of activity. Patient reported she walks daily and experiences SOB with exertion after walking half a mile. Per Dr Valma Cava, this is satisfactory; patient should be ok to proceed as scheduled and if a stress test is done at her PCP office obtain results. RN will F/U on any results cardiac wise from PCP appt.   RN also advised to decrease caffeine intake and be mindful of sodium intake as well. Patient verbalized understanding.

## 2017-10-19 NOTE — Telephone Encounter (Signed)
Letter completed with office visit notes from 10/19/2017 were faxed to Dr Alvan Dame at [phone] attn: Judeen Hammans.

## 2017-10-19 NOTE — Progress Notes (Signed)
No chief complaint on file.   HPI:  Patient is seen for optimization of general medical care prior to surgery.  It appears that I inherited her after her PCP left, though she never had a new patient visit with me to review her past medical history.  On review of chart she has a history of  Palpitations, a brain aneurysm (sees neurology, has not been imaged in several years), hypothyroidism, rheumatoid arthritis (sees rheumatologist), depression, morbid obesity and chronic knee pain. She reports she has been feeling well except for the knee pain and also she has had a cold for the last 5 days.  She has had a runny nose, postnasal drip and a cough.  Denies any fevers, body aches, shortness of breath or malaise.  She did feel little wheezy initially. She denies any chest pain or palpitations in a very long time.  Reports she had an evaluation with cardiology in the past.  She had an EKG with her preop visit today and reports was told this was good and anesthesiology was involved in her preop evaluation.  Her blood pressure was up earlier today, but she reports she is taking over-the-counter cold medications. Surgery type: r knee surgery with her orthopedic surgeon Date of surgery: next week  Kidney disease?denies Prior surgeries/Issues following anesthesia? L knee surgery 3 years ago, multiple neck surgeries - reports did well with all of these surgeries and denies complications Hx MI, heart arrythmia, CHF, angina or stroke? denies Epilepsy or Seizures? deines Arthritis or problems with neck or jaw? RA Thyroid disease? hypothyroidism Liver disease? denies Asthma, COPD or chronic lung disease? denies Diabetes? denies (Needs to be evaluated by anesthesia if yes to these questions.)  Other: Poor nutrition, Frail or other: no  METS:  ?Can take care of self, such as eat, dress, or use the toilet (1 MET). yes ?Can walk up a flight of steps or a hill (4 METs).yes ?Can do heavy work around the house  such as scrubbing floors or lifting or moving heavy furniture (between 4 and 10 METs). yes ?Can participate in strenuous sports such as swimming, singles tennis, football, basketball, and skiing (>10 METs) yes . AHA Risks: Major predictors that require intensive management and may lead to delay in or cancellation of the operative procedure unless emergent: hx palpitation, but reports none in a long time  . Unstable coronary syndromes including unstable or severe angina or recent MI  . Decompensated heart failure including NYHA functional class IV or worsening or new-onset HF  . Significant arrhythmias including high grade AV block, symptomatic ventricular arrhythmias, supraventricular arrhythmias with ventricular rate >100 bpm at rest, symptomatic bradycardia, and newly recognized ventricular tachycardia  . Severe heart valve disease including severe aortic stenosis or symptomatic mitral stenosis   Other clinical predictors that warrant careful assessment of current status: hx cerebral aneurysm only noted on review of imaging - not in [place] or problem list - I added these to PMH and problem list, she reports sees neurologist, Dr. Catalina Gravel for this but not in several years  . History of ischemic heart disease . History of cerebrovascular disease  . History of compensated heart failure or prior heart failure  . Diabetes mellitus  . Renal insufficiency  Type of surgery and Risk: 1) High risk (reported risk of cardiac death or nonfatal myocardial infarction [MI] often greater than 5 percent):  Marland Kitchen Aortic and other major vascular surgery  . Peripheral artery surgery   2)Intermediate risk (reported risk of cardiac  death or nonfatal MI generally 1 to 5 percent):  Marland Kitchen Carotid endarterectomy  . Head and neck surgery  . Intraperitoneal and intrathoracic surgery  . Orthopedic surgery  . Prostate surgery   3)Low risk (reported risk of cardiac death or nonfatal MI generally less than 1 percent):  Marland Kitchen Ambulatory  surgery  . Endoscopic procedures  . Superficial procedure  . Cataract surgery  . Breast surgery  Medications that need to be addressed prior to surgery: None Discontinue acei/arbs/non-statin lipid lowering drugs day of surgery ASA stop 7 days before or discuss with cardiology if CV risks, other anticoagulants discuss with cardiology.  ROS: See pertinent positives and negatives per HPI. 11 point ROS negative except where noted.  Past Medical History:  Diagnosis Date  . ANEMIA-NOS 04/23/2008  . Aneurysm ([place]) 10/19/2017   Cerebral Followed by Dr. Catalina Gravel  . Anginal pain ([place])    went to ED in april 2018 c/o chest pain over last 2 months ; had EKG, CXR  and troponin negative per physician suspected musculoskeletal  ; dc'd with ibuprofen  and recc f/u with outpatient stress test; see care everywhere ED visit  ; patient denies recurrence of Chest pain since, endorses occ palpitations   . Coughing    only coughing up clear sputum; to see her PCP today at 2:45 to address sx   . DEPRESSION 04/23/2008  . DJD (degenerative joint disease)   . Hyperplastic colon polyp    x2  . Hypertension   . HYPOTHYROIDISM 10/17/2007  . Osteoarthritis   . Palpitations    freq at night;  at pre-op states she drinks caffeine and that makes it worse; says the palpitations started after they took her thyroid   . PAT 10/27/2009   Qualifier: Diagnosis of  By: [name], MD, [name]    . PONV (postoperative nausea and vomiting)    only after 1979 surgery  . RA (rheumatoid arthritis) ([place])    "problems in feet, hands, and knees"  . SVT (supraventricular tachycardia) ([facility])    chronic   . Tubulovillous adenoma of colon     Past Surgical History:  Procedure Laterality Date  . ABDOMINAL HYSTERECTOMY    . APPENDECTOMY    . BUNIONECTOMY  10/2009 right & 02/03/10 left  . COLONOSCOPY W/ POLYPECTOMY  2015  . HAMMER TOE SURGERY     "all toes have pins, done with bunionectomy"  . INGUINAL HERNIA REPAIR    . neck fusion     x3    . THYROIDECTOMY  2001   for nodules  . TOE AMPUTATION  1979   6th toe removed from each foot  . TOTAL KNEE ARTHROPLASTY Left 10/21/2014   Procedure: LEFT TOTAL KNEE ARTHROPLASTY;  Surgeon: [name], MD;  Location: [facility];  Service: Orthopedics;  Laterality: Left;    Family History  Problem Relation Age of Onset  . Colon cancer Mother   . Dementia Father   . Heart attack Maternal Grandmother   . Stroke Maternal Grandmother   . Diabetes Maternal Grandmother   . Heart disease Maternal Grandmother   . Thyroid disease Maternal Grandmother   . Esophageal cancer Neg Hx   . Rectal cancer Neg Hx   . Stomach cancer Neg Hx     Social History   Socioeconomic History  . Marital status: Married    Spouse name: None  . Number of children: 2  . Years of education: 35  . Highest education level: None  Social Needs  .  Financial resource strain: None  . Food insecurity - worry: None  . Food insecurity - inability: None  . Transportation needs - medical: None  . Transportation needs - non-medical: None  Occupational History    Employer: [employer]  Tobacco Use  . Smoking status: Former Smoker    Packs/day: 0.50    Years: 33.00    Pack years: 16.50    Last attempt to quit: 09/15/2011    Years since quitting: 6.0  . Smokeless tobacco: Never Used  Substance and Sexual Activity  . Alcohol use: Yes    Comment: rare  . Drug use: No  . Sexual activity: None  Other Topics Concern  . 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 Married - 2006  1 son '92  1 dtr '94. Work - at [employer].     Current Outpatient Medications:  .  albuterol (PROVENTIL HFA;VENTOLIN HFA) 108 (90 Base) MCG/ACT inhaler, Inhale 2 puffs into the lungs every 6 (six) hours as needed. (Patient taking differently: Inhale 2 puffs into the lungs every 6 (six) hours as needed for wheezing or shortness of breath. ), Disp: 1 Inhaler, Rfl: 0 .  amLODipine-benazepril (LOTREL) 5-10 MG  capsule, Take 1 capsule by mouth daily., Disp: 30 capsule, Rfl: 5 .  diphenhydrAMINE (BENADRYL) 25 MG tablet, Take 25 mg by mouth daily as needed for itching., Disp: , Rfl:  .  ibuprofen (ADVIL,MOTRIN) 200 MG tablet, Take 400 mg by mouth 2 (two) times daily as needed for moderate pain., Disp: , Rfl:  .  levothyroxine (SYNTHROID, LEVOTHROID) 175 MCG tablet, Take 1 tablet (175 mcg total) by mouth every morning. Pt. Needs to schedule an appointment., Disp: 30 tablet, Rfl: 0 .  venlafaxine XR (EFFEXOR-XR) 75 MG 24 hr capsule, Take 1 capsule (75 mg total) by mouth daily with breakfast., Disp: 30 capsule, Rfl: 3 .  benzonatate (TESSALON PERLES) 100 MG capsule, Take 1 capsule (100 mg total) by mouth 3 (three) times daily as needed., Disp: 20 capsule, Rfl: 0  EXAM:  Vitals:   10/19/17 1507  BP: 122/78  Pulse: 88  Temp: 98 F (36.7 C)  SpO2: 96%    Body mass index is 40.42 kg/m.  GENERAL: vitals reviewed and listed above, alert, oriented, appears well hydrated and in no acute distress  HEENT: atraumatic, conjunttiva clear, no obvious abnormalities on inspection of external nose and ears, normal appearance of ear canals and TMs, clear nasal congestion, mild post oropharyngeal erythema with PND, no tonsillar edema or exudate, no sinus TTP  NECK: no obvious masses on inspection, no carotid bruits  LUNGS: clear to auscultation bilaterally, no wheezes, rales or rhonchi, good air movement  CV: HRRR, no peripheral edema, no JVD, BP normal range, normal radial pulses  MS: moves all extremities without noticeable abnormality  PSYCH: pleasant and cooperative, no obvious depression or anxiety  More than 50% of over 40 minutes spent in total in caring for this patient was spent face-to-face with the patient, counseling and/or coordinating care.   ASSESSMENT AND PLAN:  Discussed the following assessment and plan:  Pre-operative examination  Chronic pain of right knee  Aneurysm ([place]) - -sees  neurology  Viral upper respiratory illness - Plan: DG Chest 2 View  Rheumatoid arthritis, involving unspecified site, unspecified rheumatoid factor presence ([place]) - -sees rheum  Hypothyroidism, unspecified type  History of neck surgery  Assessment: -general health risk factors/comorbidities: hypothyroid, obesity, RA, hx cerebral aneurysm, acute URI -Surgery  Risks:intermediate -age, nutritional status, fraility: good nutritional status, age >3, no fraility -functional capacity: > 6 METs without symptoms Patient Specific Risks: patient is low risk for intermediate risks surgery  Recommendations for optimizing general medical care prior to surgery: -Patient is seen for optimization of general medical care prior to surgery.  It appears that I inherited her after her PCP left, though she never had a new patient visit with me to review her past medical history and has only been in a few times since for urgent issues. Several chronic problems are managed by her specialists. Reviewed labs from today.  -advise CXR given cough and acute respiratory illness to exclude lower respiritory tract infection - orders placed -advise anesthesiology evaluation prior to surgery given hx palpitations, hypothyroidism, aneurysm, RA - advised rheumatology and neurology recommendations or clearance prior to surgery for her history RA and aneurysm - sees specialist for these -advised patient to discuss specific risks morbidity and mortality of surgery with surgeon, CV risks discussed with patient -advised patient will defer to surgeon for post-op DVT prophylaxis and post op care and perioperative management of her medications.   Advised assistant to send summary assessment and recommendations to orthopedic office.  -Patient advised to return or notify a doctor immediately if symptoms worsen or persist or new concerns arise.  Patient Instructions  BEFORE YOU LEAVE: -xray sheet -follow up: 3-4 months  Get  xray  Wendie Simmer will send letter to your surgeon's office. Please notify your neurologist and your rheumatologist that you will be having surgery to see if they have any recommendations prior to surgery.    INSTRUCTIONS FOR UPPER RESPIRATORY INFECTION:  -plenty of rest and fluids  -nasal saline wash 2-3 times daily (use prepackaged nasal saline or bottled/distilled water if making your own)   -can use AFRIN nasal spray for drainage and nasal congestion - but do NOT use longer then 3-4 days  -can use tylenol (in no history of liver disease) or ibuprofen (if no history of kidney disease, bowel bleeding or significant heart disease) as directed for aches and sorethroat  -in the winter time, using a humidifier at night is helpful (please follow cleaning instructions)  -if you are taking a cough medication - use only as directed, may also try a teaspoon of honey to coat the throat and throat lozenges. If given a cough medication with codeine or hydrocodone or other narcotic please be advised that this contains a strong and  potentially addicting medication. Please follow instructions carefully, take as little as possible and only use AS NEEDED for severe cough. Discuss potential side effects with your pharmacy. Please do not drive or operate machinery while taking these types of medications. Please do not take other sedating medications, drugs or alcohol while taking this medication without discussing with your doctor.  -for sore throat, salt water gargles can help  -follow up if you have fevers, facial pain, tooth pain, difficulty breathing or are worsening or symptoms persist longer then expected  Upper Respiratory Infection, Adult An upper respiratory infection (URI) is also known as the common cold. It is often caused by a type of germ (virus). Colds are easily spread (contagious). You can pass it to others by kissing, coughing, sneezing, or drinking out of the same glass. Usually, you get  better in 1 to 3  weeks.  However, the cough can last for even longer. HOME CARE   Only take medicine as told by your doctor. Follow instructions provided above.  Drink  enough water and fluids to keep your pee (urine) clear or pale yellow.  Get plenty of rest.  Return to work when your temperature is < 100 for 24 hours or as told by your doctor. You may use a face mask and wash your hands to stop your cold from spreading. GET HELP RIGHT AWAY IF:   After the first few days, you feel you are getting worse.  You have questions about your medicine.  You have chills, shortness of breath, or red spit (mucus).  You have pain in the face for more then 1-2 days, especially when you bend forward.  You have a fever, puffy (swollen) neck, pain when you swallow, or white spots in the back of your throat.  You have a bad headache, ear pain, sinus pain, or chest pain.  You have a high-pitched whistling sound when you breathe in and out (wheezing).  You cough up blood.  You have sore muscles or a stiff neck. MAKE SURE YOU:   Understand these instructions.  Will watch your condition.  Will get help right away if you are not doing well or get worse. Document Released: 02/22/2008 Document Revised: 11/28/2011 Document Reviewed: 12/11/2013 [facility] Patient Information 2015 [place], [place]. This information is not intended to replace advice given to you by your health care provider. Make sure you discuss any questions you have with your health care provider.     Paula Jacobs

## 2017-10-19 NOTE — Patient Instructions (Signed)
BEFORE YOU LEAVE: -xray sheet -follow up: 3-4 months  Get xray  Wendie Simmer will send letter to your surgeon's office. Please notify your neurologist and your rheumatologist that you will be having surgery to see if they have any recommendations prior to surgery.    INSTRUCTIONS FOR UPPER RESPIRATORY INFECTION:  -plenty of rest and fluids  -nasal saline wash 2-3 times daily (use prepackaged nasal saline or bottled/distilled water if making your own)   -can use AFRIN nasal spray for drainage and nasal congestion - but do NOT use longer then 3-4 days  -can use tylenol (in no history of liver disease) or ibuprofen (if no history of kidney disease, bowel bleeding or significant heart disease) as directed for aches and sorethroat  -in the winter time, using a humidifier at night is helpful (please follow cleaning instructions)  -if you are taking a cough medication - use only as directed, may also try a teaspoon of honey to coat the throat and throat lozenges. If given a cough medication with codeine or hydrocodone or other narcotic please be advised that this contains a strong and  potentially addicting medication. Please follow instructions carefully, take as little as possible and only use AS NEEDED for severe cough. Discuss potential side effects with your pharmacy. Please do not drive or operate machinery while taking these types of medications. Please do not take other sedating medications, drugs or alcohol while taking this medication without discussing with your doctor.  -for sore throat, salt water gargles can help  -follow up if you have fevers, facial pain, tooth pain, difficulty breathing or are worsening or symptoms persist longer then expected  Upper Respiratory Infection, Adult An upper respiratory infection (URI) is also known as the common cold. It is often caused by a type of germ (virus). Colds are easily spread (contagious). You can pass it to others by kissing, coughing,  sneezing, or drinking out of the same glass. Usually, you get better in 1 to 3  weeks.  However, the cough can last for even longer. HOME CARE   Only take medicine as told by your doctor. Follow instructions provided above.  Drink enough water and fluids to keep your pee (urine) clear or pale yellow.  Get plenty of rest.  Return to work when your temperature is < 100 for 24 hours or as told by your doctor. You may use a face mask and wash your hands to stop your cold from spreading. GET HELP RIGHT AWAY IF:   After the first few days, you feel you are getting worse.  You have questions about your medicine.  You have chills, shortness of breath, or red spit (mucus).  You have pain in the face for more then 1-2 days, especially when you bend forward.  You have a fever, puffy (swollen) neck, pain when you swallow, or white spots in the back of your throat.  You have a bad headache, ear pain, sinus pain, or chest pain.  You have a high-pitched whistling sound when you breathe in and out (wheezing).  You cough up blood.  You have sore muscles or a stiff neck. MAKE SURE YOU:   Understand these instructions.  Will watch your condition.  Will get help right away if you are not doing well or get worse. Document Released: 02/22/2008 Document Revised: 11/28/2011 Document Reviewed: 12/11/2013 [facility] Patient Information 2015 [place], [place]. This information is not intended to replace advice given to you by your health care provider. Make sure you  discuss any questions you have with your health care provider.  

## 2017-10-20 NOTE — Progress Notes (Addendum)
RN SPOKE WITH ANESTHESIA DR Ermalene Postin REGARDING UPCOMING SURGERY. RN EXPLAINED TO DR MOSER THAT RN HAD CONSULTED WITH ANESTHESIA DR HOUSER YESTERDAY MORNING ABOUT THE SAME PATIENT BUT THAT WAS TO ADDRESS PATIENT ED VISIT IN 2018 FOR CHEST PAIN AND DYSPNEA ON EXERTION, WHICH DR HOUSER HAD GIVEN PATIENT THE OK TO PROCEED. NOW RN IS CALLING ANESTHESIA BACK TODAY BECAUSE PATIENT SAW HER PCP YESTERDAY AFTERNOON AT 2:45PM (SEE HANNAH KIM NOTE IN Epic 10-19-17) AND WAS GIVEN SURGICAL CLEARANCE WITH PARAMETERS THAT NEUROLOGY MUST CLEAR THE PATIENT FIRST BECAUSE OF HISTORY OF ANEURYSM. PER DR MOSER, "IF PCP REQUIRES NEUROLOGY TO CLEAR BEFORE SURGERY , THEN NEUROLOGY HAS TO CLEAR HER ".  RN CALLED SHERRY WILLS AT [facility]) AND LEFT A VOICEMAIL MESSAGE TO MAKE AWARE OF PATIENT  NEED FOR NEUROLOGY CLEARANCE. RN LEFT CALL BACK NUMBER FOR F/U .   UPDATE 33   RN RECEIVED CALL BACK FROM SHERRY [place]. SHERRY SAYS SHE WILL CONTACT PATIENT TO MAKE HER AWARE OF NEURO CLEARANCE REQUIREMENT. PER SHERRY , PATIENT MAY BE CANCELLED IF UNABLE TO TO ARRANGE NEURO EVALUATION .

## 2017-10-23 ENCOUNTER — Ambulatory Visit (INDEPENDENT_AMBULATORY_CARE_PROVIDER_SITE_OTHER)
Admission: RE | Admit: 2017-10-23 | Discharge: 2017-10-23 | Disposition: A | Discharge status: Home / Self Care | Payer: Managed Care, Other (non HMO) | Source: Ambulatory Visit | Attending: Family Medicine | Admitting: Family Medicine

## 2017-10-23 ENCOUNTER — Telehealth: Payer: Self-pay

## 2017-10-23 DIAGNOSIS — J069 Acute upper respiratory infection, unspecified: Secondary | ICD-10-CM

## 2017-10-23 NOTE — Progress Notes (Addendum)
Received a return call from [facility]. She just spoke with the patient who is very upset that she is requiring neurologyl clearance. Pt states that she was cleared from Neurology care in the past and she is unable to get in touch with the provider who cleared her. Pt is requesting to speak to Anesthesia.Marland KitchenMarland KitchenSpoke with Ivin Booty, RN, pt is unable to speak with Anesthesia. Pt can come in during her scheduled arrival time on 10-24-17 and speak with Anesthesia at that time, This information related to [facility] Sherri  indicated that she will speak with Dr. Alvan Dame and they will decide whether to cancel surgery, or advise pt to come in and discuss her situation with Anesthesia on 10-24-17.     Judeen Hammans spoke to Dr. Alvan Dame, pt surgery will be cancelled. Judeen Hammans will contact patient to advise.

## 2017-10-23 NOTE — Telephone Encounter (Signed)
Pt has called and is very upset. She states that her surgery for her knee has been canceled. She has not been able to reach her previous neuro, Dr. Catalina Gravel, when she tries to call his office it says his phone number has been disconnected. She has not scheduled with her rheumatologist. She is upset because they just called her today and advised her surgery for tomorrow has been canceled. She has already taken the time off of work. She states that several years ago when she had the other knee surgery performed "no one was worried about all this then". She is not sure what has changed, as she states that she had all the same medical conditions at that time. She would like to know what you recommend since her neurologist is unreachable. Advised pt to go ahead and call rheum to schedule OV as recommended.   Dr. Maudie Mercury - Please advise. Thanks!

## 2017-10-23 NOTE — Progress Notes (Signed)
10-23-17 Left voice message for Orson Slick stating that Neurology clearance is still needed. No return call at this time. Forwarded  chart to Short Stay with note indicating that Neurological clearance is needed.

## 2017-10-24 ENCOUNTER — Ambulatory Visit (HOSPITAL_COMMUNITY)
Admission: RE | Admit: 2017-10-24 | Payer: Managed Care, Other (non HMO) | Source: Ambulatory Visit | Admitting: Orthopedic Surgery

## 2017-10-24 ENCOUNTER — Encounter (HOSPITAL_COMMUNITY): Admission: RE | Payer: Self-pay | Source: Ambulatory Visit

## 2017-10-24 LAB — TYPE AND SCREEN
ABO/RH(D): A POS
ANTIBODY SCREEN: NEGATIVE

## 2017-10-24 SURGERY — ARTHROPLASTY, KNEE, TOTAL
Anesthesia: Spinal | Site: Knee | Laterality: Right

## 2017-10-24 NOTE — Telephone Encounter (Signed)
I am not sure from this message if she is upset at [place] or just upset. We did not cancel her surgery, her surgeon did. Perhaps, because he is trying to ensure she is in the best medical shape possible prior to surgery to ensure her success. She should address these issues with her surgeon. I advised that she touch base with her neurologist and rheumatologist prior to surgery and notify her anesthesiologist of all of her health history as I do not manage her cerebral aneurysm nor her rheumatoid arthritis.  Vita Barley can you try to contact her neurologist? See if he feels needs anything prior to surgery in regards to her aneurysm (reimage, etc.)Would advise that if for some reason he is no longer in practice we can refer her to [facility] for the aneurysm. If her knee surgeon needs info about the aneurysm prior to him feeling comfortable to do surgery, advise he can consult neurology. Thank you.

## 2017-10-25 NOTE — Telephone Encounter (Signed)
LMTCB to see if she has gotten any resolution to her surgery scheduling issue

## 2017-10-31 ENCOUNTER — Telehealth: Payer: Self-pay | Admitting: Family Medicine

## 2017-10-31 NOTE — Telephone Encounter (Signed)
I called the pt and informed her of the message below and she stated she contacted the neurologist's office.

## 2017-10-31 NOTE — Telephone Encounter (Signed)
We advised that any sedation required for procedures be ordered by the provider ordering the procedure.  Advised that she please check with her neurologist.

## 2017-10-31 NOTE — Telephone Encounter (Signed)
Copied from [place] [id number]. Topic: General - Other >> Oct 31, 2017  1:35 PM Lolita Rieger, [place] wrote: Reason for CRM: pt called and stated that she needs a low dose valium called in for her up coming MRA scheduled by her neurologist sent to her pharmacy [facility] on [address]

## 2017-11-24 NOTE — Progress Notes (Signed)
Please place orders in Epic as patient has a pre-op appointment on 12/01/2017! Thank you!

## 2017-11-28 NOTE — Progress Notes (Signed)
Surgery on 3/19.  Preop on 12/01/2017.  Need orders in epic.

## 2017-11-29 NOTE — H&P (Signed)
TOTAL KNEE ADMISSION H&P  Patient is being admitted for right total knee arthroplasty.  Subjective:  Chief Complaint:   Right knee primary OA / pain  HPI: Paula Jacobs, 57 y.o. female, has a history of pain and functional disability in the right knee due to arthritis and has failed non-surgical conservative treatments for greater than 12 weeks to include NSAID's and/or analgesics, corticosteriod injections and activity modification.  Onset of symptoms was gradual, starting 2 years ago with gradually worsening course since that time. The patient noted prior procedures on the knee to include  arthroplasty on the left knee per Dr. Alvan Dame 3-4 yrs ago.  Patient currently rates pain in the right knee(s) at 8 out of 10 with activity. Patient has night pain, worsening of pain with activity and weight bearing, pain that interferes with activities of daily living, pain with passive range of motion, crepitus and joint swelling.  Patient has evidence of periarticular osteophytes and joint space narrowing by imaging studies.  There is no active infection.  Risks, benefits and expectations were discussed with the patient.  Risks including but not limited to the risk of anesthesia, blood clots, nerve damage, blood vessel damage, failure of the prosthesis, infection and up to and including death.  Patient understand the risks, benefits and expectations and wishes to proceed with surgery.   PCP: [name], DO  D/C Plans:       Home   Post-op Meds:       No Rx given  Tranexamic Acid:      To be given - IV   Decadron:      Is to be given  FYI:      ASA  Norco  DME:   Pt already has equipment   PT:   OPPT Rx given   Patient Active Problem List   Diagnosis Date Noted  . Aneurysm ([place]) 10/19/2017  . Moderate episode of recurrent major depressive disorder ([place]) 09/05/2017  . Recurrent major depressive disorder, in full remission ([place]) 09/16/2016  . Morbid obesity ([place]) 10/22/2014  . S/P left TKA  10/21/2014  . S/P knee replacement 10/21/2014  . Rheumatoid arthritis ([place]) 09/26/2013  . Hypertension 01/31/2011  . Hypothyroidism 10/17/2007   Past Medical History:  Diagnosis Date  . ANEMIA-NOS 04/23/2008  . Aneurysm ([place]) 10/19/2017   Cerebral Followed by Dr. Catalina Gravel  . Anginal pain ([place])    went to ED in april 2018 c/o chest pain over last 2 months ; had EKG, CXR  and troponin negative per physician suspected musculoskeletal  ; dc'd with ibuprofen  and recc f/u with outpatient stress test; see care everywhere ED visit  ; patient denies recurrence of Chest pain since, endorses occ palpitations   . Coughing    only coughing up clear sputum; to see her PCP today at 2:45 to address sx   . DEPRESSION 04/23/2008  . DJD (degenerative joint disease)   . Hyperplastic colon polyp    x2  . Hypertension   . HYPOTHYROIDISM 10/17/2007  . Osteoarthritis   . Palpitations    freq at night;  at pre-op states she drinks caffeine and that makes it worse; says the palpitations started after they took her thyroid   . PAT 10/27/2009   Qualifier: Diagnosis of  By: [name], MD, [name]    . PONV (postoperative nausea and vomiting)    only after 1979 surgery  . RA (rheumatoid arthritis) ([place])    "problems in feet, hands, and knees"  .  SVT (supraventricular tachycardia) ([facility])    chronic   . Tubulovillous adenoma of colon     Past Surgical History:  Procedure Laterality Date  . ABDOMINAL HYSTERECTOMY    . APPENDECTOMY    . BUNIONECTOMY  10/2009 right & 02/03/10 left  . COLONOSCOPY W/ POLYPECTOMY  2015  . HAMMER TOE SURGERY     "all toes have pins, done with bunionectomy"  . INGUINAL HERNIA REPAIR    . neck fusion     x3  . THYROIDECTOMY  2001   for nodules  . TOE AMPUTATION  1979   6th toe removed from each foot  . TOTAL KNEE ARTHROPLASTY Left 10/21/2014   Procedure: LEFT TOTAL KNEE ARTHROPLASTY;  Surgeon: [name], MD;  Location: [facility];  Service: Orthopedics;  Laterality: Left;    No current  facility-administered medications for this encounter.    Current Outpatient Medications  Medication Sig Dispense Refill Last Dose  . albuterol (PROVENTIL HFA;VENTOLIN HFA) 108 (90 Base) MCG/ACT inhaler Inhale 2 puffs into the lungs every 6 (six) hours as needed. (Patient taking differently: Inhale 2 puffs into the lungs every 6 (six) hours as needed for wheezing or shortness of breath. ) 1 Inhaler 0 Taking  . amLODipine-benazepril (LOTREL) 5-10 MG capsule Take 1 capsule by mouth daily. 30 capsule 5 Taking  . ibuprofen (ADVIL,MOTRIN) 200 MG tablet Take 400 mg by mouth every 8 (eight) hours as needed (for arthritis pain.).    Taking  . levothyroxine (SYNTHROID, LEVOTHROID) 175 MCG tablet Take 1 tablet (175 mcg total) by mouth every morning. Pt. Needs to schedule an appointment. (Patient taking differently: Take 175 mcg by mouth daily before breakfast. ) 30 tablet 0 Taking  . naproxen sodium (ALEVE) 220 MG tablet Take 220-440 mg by mouth 2 (two) times daily as needed (for arthritis pain.).     Marland Kitchen benzonatate (TESSALON PERLES) 100 MG capsule Take 1 capsule (100 mg total) by mouth 3 (three) times daily as needed. (Patient not taking: Reported on 11/24/2017) 20 capsule 0 Not Taking at Unknown time  . venlafaxine XR (EFFEXOR-XR) 75 MG 24 hr capsule Take 1 capsule (75 mg total) by mouth daily with breakfast. (Patient not taking: Reported on 11/24/2017) 30 capsule 3 Not Taking at Unknown time   Allergies  Allergen Reactions  . Hydrocodone-Acetaminophen Itching  . Latex Itching  . Tramadol Hcl Itching and Nausea Only    Social History   Tobacco Use  . Smoking status: Former Smoker    Packs/day: 0.50    Years: 33.00    Pack years: 16.50    Last attempt to quit: 09/15/2011    Years since quitting: 6.2  . Smokeless tobacco: Never Used  Substance Use Topics  . Alcohol use: Yes    Comment: rare    Family History  Problem Relation Age of Onset  . Colon cancer Mother   . Dementia Father   . Heart  attack Maternal Grandmother   . Stroke Maternal Grandmother   . Diabetes Maternal Grandmother   . Heart disease Maternal Grandmother   . Thyroid disease Maternal Grandmother   . Esophageal cancer Neg Hx   . Rectal cancer Neg Hx   . Stomach cancer Neg Hx      Review of Systems  Constitutional: Negative.   HENT: Negative.   Eyes: Negative.   Respiratory: Negative.   Cardiovascular: Negative.   Gastrointestinal: Negative.   Genitourinary: Negative.   Musculoskeletal: Positive for joint pain.  Skin: Negative.  Neurological: Negative.   Endo/Heme/Allergies: Negative.   Psychiatric/Behavioral: Positive for depression.    Objective:  Physical Exam  Constitutional: She is oriented to person, place, and time. She appears well-developed.  HENT:  Head: Normocephalic.  Eyes: Pupils are equal, round, and reactive to light.  Neck: Neck supple. No JVD present. No tracheal deviation present. No thyromegaly present.  Cardiovascular: Normal rate, regular rhythm and intact distal pulses.  Respiratory: Effort normal and breath sounds normal. No respiratory distress. She has no wheezes.  GI: Soft. There is no tenderness. There is no guarding.  Musculoskeletal:       Right knee: She exhibits decreased range of motion, swelling and bony tenderness. She exhibits no ecchymosis, no deformity, no laceration and no erythema. Tenderness found.  Lymphadenopathy:    She has no cervical adenopathy.  Neurological: She is alert and oriented to person, place, and time.  Skin: Skin is warm and dry.  Psychiatric: She has a normal mood and affect.      Labs:  Estimated body mass index is 40.42 kg/m as calculated from the following:   Height as of 10/19/17: 5\' 2"  (1.575 m).   Weight as of 10/19/17: 100.2 kg (221 lb).   Imaging Review Plain radiographs demonstrate severe degenerative joint disease of the right knee.  The bone quality appears to be good for age and reported activity  level.  Assessment/Plan:  End stage arthritis, right knee   The patient history, physical examination, clinical judgment of the provider and imaging studies are consistent with end stage degenerative joint disease of the right knee(s) and total knee arthroplasty is deemed medically necessary. The treatment options including medical management, injection therapy arthroscopy and arthroplasty were discussed at length. The risks and benefits of total knee arthroplasty were presented and reviewed. The risks due to aseptic loosening, infection, stiffness, patella tracking problems, thromboembolic complications and other imponderables were discussed. The patient acknowledged the explanation, agreed to proceed with the plan and consent was signed. Patient is being admitted for inpatient treatment for surgery, pain control, PT, OT, prophylactic antibiotics, VTE prophylaxis, progressive ambulation and ADL's and discharge planning. The patient is planning to be discharged home.    [place] Demian Maisel   PA-C  11/29/2017, 9:50 AM

## 2017-11-30 NOTE — Patient Instructions (Signed)
Paula Jacobs  11/30/2017   Your procedure is scheduled on: 12-05-17  Report to [facility]  Entrance Report to Admitting at 10:15 AM    Call this number if you have problems the morning of surgery [phone]   Remember: Do not eat food or drink liquids :After Midnight.     Take these medicines the morning of surgery with A SIP OF WATER: Levothyroxine (Synthroid)                                You may not have any metal on your body including hair pins and              piercings  Do not wear jewelry, make-up, lotions, powders or perfumes, deodorant             Do not wear nail polish.  Do not shave  48 hours prior to surgery.                Do not bring valuables to the hospital. [place].  Contacts, dentures or bridgework may not be worn into surgery.  Leave suitcase in the car. After surgery it may be brought to your room.                  Please read over the following fact sheets you were given: _____________________________________________________________________           [facility] - Preparing for Surgery Before surgery, you can play an important role.  Because skin is not sterile, your skin needs to be as free of germs as possible.  You can reduce the number of germs on your skin by washing with CHG (chlorahexidine gluconate) soap before surgery.  CHG is an antiseptic cleaner which kills germs and bonds with the skin to continue killing germs even after washing. Please DO NOT use if you have an allergy to CHG or antibacterial soaps.  If your skin becomes reddened/irritated stop using the CHG and inform your nurse when you arrive at Short Stay. Do not shave (including legs and underarms) for at least 48 hours prior to the first CHG shower.  You may shave your face/neck. Please follow these instructions carefully:  1.  Shower with CHG Soap the night before surgery and the  morning of  Surgery.  2.  If you choose to wash your hair, wash your hair first as usual with your  normal  shampoo.  3.  After you shampoo, rinse your hair and body thoroughly to remove the  shampoo.                           4.  Use CHG as you would any other liquid soap.  You can apply chg directly  to the skin and wash                       Gently with a scrungie or clean washcloth.  5.  Apply the CHG Soap to your body ONLY FROM THE NECK DOWN.   Do not use on face/ open  Wound or open sores. Avoid contact with eyes, ears mouth and genitals (private parts).                       Wash face,  Genitals (private parts) with your normal soap.             6.  Wash thoroughly, paying special attention to the area where your surgery  will be performed.  7.  Thoroughly rinse your body with warm water from the neck down.  8.  DO NOT shower/wash with your normal soap after using and rinsing off  the CHG Soap.                9.  Pat yourself dry with a clean towel.            10.  Wear clean pajamas.            11.  Place clean sheets on your bed the night of your first shower and do not  sleep with pets. Day of Surgery : Do not apply any lotions/deodorants the morning of surgery.  Please wear clean clothes to the hospital/surgery center.  FAILURE TO FOLLOW THESE INSTRUCTIONS MAY RESULT IN THE CANCELLATION OF YOUR SURGERY PATIENT SIGNATURE_________________________________  NURSE SIGNATURE__________________________________  ________________________________________________________________________   Adam Phenix  An incentive spirometer is a tool that can help keep your lungs clear and active. This tool measures how well you are filling your lungs with each breath. Taking long deep breaths may help reverse or decrease the chance of developing breathing (pulmonary) problems (especially infection) following:  A long period of time when you are unable to move or be active. BEFORE  THE PROCEDURE   If the spirometer includes an indicator to show your best effort, your nurse or respiratory therapist will set it to a desired goal.  If possible, sit up straight or lean slightly forward. Try not to slouch.  Hold the incentive spirometer in an upright position. INSTRUCTIONS FOR USE  1. Sit on the edge of your bed if possible, or sit up as far as you can in bed or on a chair. 2. Hold the incentive spirometer in an upright position. 3. Breathe out normally. 4. Place the mouthpiece in your mouth and seal your lips tightly around it. 5. Breathe in slowly and as deeply as possible, raising the piston or the ball toward the top of the column. 6. Hold your breath for 3-5 seconds or for as long as possible. Allow the piston or ball to fall to the bottom of the column. 7. Remove the mouthpiece from your mouth and breathe out normally. 8. Rest for a few seconds and repeat Steps 1 through 7 at least 10 times every 1-2 hours when you are awake. Take your time and take a few normal breaths between deep breaths. 9. The spirometer may include an indicator to show your best effort. Use the indicator as a goal to work toward during each repetition. 10. After each set of 10 deep breaths, practice coughing to be sure your lungs are clear. If you have an incision (the cut made at the time of surgery), support your incision when coughing by placing a pillow or rolled up towels firmly against it. Once you are able to get out of bed, walk around indoors and cough well. You may stop using the incentive spirometer when instructed by your caregiver.  RISKS AND COMPLICATIONS  Take your time so you do not get  dizzy or light-headed.  If you are in pain, you may need to take or ask for pain medication before doing incentive spirometry. It is harder to take a deep breath if you are having pain. AFTER USE  Rest and breathe slowly and easily.  It can be helpful to keep track of a log of your progress.  Your caregiver can provide you with a simple table to help with this. If you are using the spirometer at home, follow these instructions: [place] IF:   You are having difficultly using the spirometer.  You have trouble using the spirometer as often as instructed.  Your pain medication is not giving enough relief while using the spirometer.  You develop fever of 100.5 F (38.1 C) or higher. SEEK IMMEDIATE MEDICAL CARE IF:   You cough up bloody sputum that had not been present before.  You develop fever of 102 F (38.9 C) or greater.  You develop worsening pain at or near the incision site. MAKE SURE YOU:   Understand these instructions.  Will watch your condition.  Will get help right away if you are not doing well or get worse. Document Released: 01/16/2007 Document Revised: 11/28/2011 Document Reviewed: 03/19/2007 ExitCare Patient Information 2014 Exit[employer], [place].   ________________________________________________________________________  WHAT IS A BLOOD TRANSFUSION? Blood Transfusion Information  A transfusion is the replacement of blood or some of its parts. Blood is made up of multiple cells which provide different functions.  Red blood cells carry oxygen and are used for blood loss replacement.  White blood cells fight against infection.  Platelets control bleeding.  Plasma helps clot blood.  Other blood products are available for specialized needs, such as hemophilia or other clotting disorders. BEFORE THE TRANSFUSION  Who gives blood for transfusions?   Healthy volunteers who are fully evaluated to make sure their blood is safe. This is blood bank blood. Transfusion therapy is the safest it has ever been in the practice of medicine. Before blood is taken from a donor, a complete history is taken to make sure that person has no history of diseases nor engages in risky social behavior (examples are intravenous drug use or sexual activity with multiple  partners). The donor's travel history is screened to minimize risk of transmitting infections, such as malaria. The donated blood is tested for signs of infectious diseases, such as HIV and hepatitis. The blood is then tested to be sure it is compatible with you in order to minimize the chance of a transfusion reaction. If you or a relative donates blood, this is often done in anticipation of surgery and is not appropriate for emergency situations. It takes many days to process the donated blood. RISKS AND COMPLICATIONS Although transfusion therapy is very safe and saves many lives, the main dangers of transfusion include:   Getting an infectious disease.  Developing a transfusion reaction. This is an allergic reaction to something in the blood you were given. Every precaution is taken to prevent this. The decision to have a blood transfusion has been considered carefully by your caregiver before blood is given. Blood is not given unless the benefits outweigh the risks. AFTER THE TRANSFUSION  Right after receiving a blood transfusion, you will usually feel much better and more energetic. This is especially true if your red blood cells have gotten low (anemic). The transfusion raises the level of the red blood cells which carry oxygen, and this usually causes an energy increase.  The nurse administering the transfusion will  monitor you carefully for complications. HOME CARE INSTRUCTIONS  No special instructions are needed after a transfusion. You may find your energy is better. Speak with your caregiver about any limitations on activity for underlying diseases you may have. SEEK MEDICAL CARE IF:   Your condition is not improving after your transfusion.  You develop redness or irritation at the intravenous (IV) site. SEEK IMMEDIATE MEDICAL CARE IF:  Any of the following symptoms occur over the next 12 hours:  Shaking chills.  You have a temperature by mouth above 102 F (38.9 C), not  controlled by medicine.  Chest, back, or muscle pain.  People around you feel you are not acting correctly or are confused.  Shortness of breath or difficulty breathing.  Dizziness and fainting.  You get a rash or develop hives.  You have a decrease in urine output.  Your urine turns a dark color or changes to pink, red, or brown. Any of the following symptoms occur over the next 10 days:  You have a temperature by mouth above 102 F (38.9 C), not controlled by medicine.  Shortness of breath.  Weakness after normal activity.  The white part of the eye turns yellow (jaundice).  You have a decrease in the amount of urine or are urinating less often.  Your urine turns a dark color or changes to pink, red, or brown. Document Released: 09/02/2000 Document Revised: 11/28/2011 Document Reviewed: 04/21/2008 [facility] Patient Information 2014 [place], [place].  _______________________________________________________________________

## 2017-11-30 NOTE — Progress Notes (Signed)
10-26-17 Neurological clearance on chart from [facility] N Case, NP  10-19-17 (Epic) EKG  10-23-17 (Epic) CXR

## 2017-12-01 ENCOUNTER — Encounter (HOSPITAL_COMMUNITY)
Admission: RE | Admit: 2017-12-01 | Discharge: 2017-12-01 | Disposition: A | Discharge status: Home / Self Care | Payer: Managed Care, Other (non HMO) | Source: Ambulatory Visit | Attending: Orthopedic Surgery | Admitting: Orthopedic Surgery

## 2017-12-01 ENCOUNTER — Encounter (HOSPITAL_COMMUNITY): Payer: Self-pay

## 2017-12-01 ENCOUNTER — Other Ambulatory Visit: Payer: Self-pay

## 2017-12-01 DIAGNOSIS — M1711 Unilateral primary osteoarthritis, right knee: Secondary | ICD-10-CM | POA: Insufficient documentation

## 2017-12-01 DIAGNOSIS — Z01812 Encounter for preprocedural laboratory examination: Secondary | ICD-10-CM | POA: Diagnosis present

## 2017-12-01 DIAGNOSIS — M25561 Pain in right knee: Secondary | ICD-10-CM | POA: Diagnosis not present

## 2017-12-01 LAB — BASIC METABOLIC PANEL
ANION GAP: 8 (ref 5–15)
BUN: 14 mg/dL (ref 6–20)
CALCIUM: 8.9 mg/dL (ref 8.9–10.3)
CO2: 25 mmol/L (ref 22–32)
Chloride: 107 mmol/L (ref 101–111)
Creatinine, Ser: 0.7 mg/dL (ref 0.44–1.00)
Glucose, Bld: 83 mg/dL (ref 65–99)
Potassium: 3.8 mmol/L (ref 3.5–5.1)
SODIUM: 140 mmol/L (ref 135–145)

## 2017-12-01 LAB — CBC
HCT: 38.8 % (ref 36.0–46.0)
Hemoglobin: 12.2 g/dL (ref 12.0–15.0)
MCH: 27.5 pg (ref 26.0–34.0)
MCHC: 31.4 g/dL (ref 30.0–36.0)
MCV: 87.4 fL (ref 78.0–100.0)
Platelets: 392 10*3/uL (ref 150–400)
RBC: 4.44 MIL/uL (ref 3.87–5.11)
RDW: 13.7 % (ref 11.5–15.5)
WBC: 8.7 10*3/uL (ref 4.0–10.5)

## 2017-12-01 LAB — SURGICAL PCR SCREEN
MRSA, PCR: NEGATIVE
STAPHYLOCOCCUS AUREUS: NEGATIVE

## 2017-12-05 ENCOUNTER — Encounter (HOSPITAL_COMMUNITY)
Admission: RE | Disposition: A | Discharge status: Home / Self Care | Payer: Self-pay | Source: Ambulatory Visit | Attending: Orthopedic Surgery

## 2017-12-05 ENCOUNTER — Observation Stay (HOSPITAL_COMMUNITY)
Admission: RE | Admit: 2017-12-05 | Discharge: 2017-12-07 | Disposition: A | Discharge status: Home / Self Care | Payer: Managed Care, Other (non HMO) | Source: Ambulatory Visit | Attending: Orthopedic Surgery | Admitting: Orthopedic Surgery

## 2017-12-05 ENCOUNTER — Encounter (HOSPITAL_COMMUNITY): Payer: Self-pay | Admitting: *Deleted

## 2017-12-05 ENCOUNTER — Inpatient Hospital Stay (HOSPITAL_COMMUNITY): Payer: Managed Care, Other (non HMO) | Admitting: Anesthesiology

## 2017-12-05 ENCOUNTER — Other Ambulatory Visit: Payer: Self-pay

## 2017-12-05 DIAGNOSIS — Z6841 Body Mass Index (BMI) 40.0 and over, adult: Secondary | ICD-10-CM | POA: Diagnosis not present

## 2017-12-05 DIAGNOSIS — M1711 Unilateral primary osteoarthritis, right knee: Principal | ICD-10-CM | POA: Insufficient documentation

## 2017-12-05 DIAGNOSIS — Z79890 Hormone replacement therapy (postmenopausal): Secondary | ICD-10-CM | POA: Insufficient documentation

## 2017-12-05 DIAGNOSIS — Y831 Surgical operation with implant of artificial internal device as the cause of abnormal reaction of the patient, or of later complication, without mention of misadventure at the time of the procedure: Secondary | ICD-10-CM | POA: Insufficient documentation

## 2017-12-05 DIAGNOSIS — Z96651 Presence of right artificial knee joint: Secondary | ICD-10-CM

## 2017-12-05 DIAGNOSIS — Z96652 Presence of left artificial knee joint: Secondary | ICD-10-CM | POA: Diagnosis not present

## 2017-12-05 DIAGNOSIS — Z89422 Acquired absence of other left toe(s): Secondary | ICD-10-CM | POA: Insufficient documentation

## 2017-12-05 DIAGNOSIS — Z89421 Acquired absence of other right toe(s): Secondary | ICD-10-CM | POA: Insufficient documentation

## 2017-12-05 DIAGNOSIS — I10 Essential (primary) hypertension: Secondary | ICD-10-CM | POA: Diagnosis not present

## 2017-12-05 DIAGNOSIS — M25761 Osteophyte, right knee: Secondary | ICD-10-CM | POA: Diagnosis not present

## 2017-12-05 DIAGNOSIS — E039 Hypothyroidism, unspecified: Secondary | ICD-10-CM | POA: Insufficient documentation

## 2017-12-05 DIAGNOSIS — T8489XA Other specified complication of internal orthopedic prosthetic devices, implants and grafts, initial encounter: Secondary | ICD-10-CM | POA: Insufficient documentation

## 2017-12-05 DIAGNOSIS — Z87891 Personal history of nicotine dependence: Secondary | ICD-10-CM | POA: Diagnosis not present

## 2017-12-05 DIAGNOSIS — J449 Chronic obstructive pulmonary disease, unspecified: Secondary | ICD-10-CM | POA: Insufficient documentation

## 2017-12-05 DIAGNOSIS — T8484XA Pain due to internal orthopedic prosthetic devices, implants and grafts, initial encounter: Secondary | ICD-10-CM | POA: Diagnosis not present

## 2017-12-05 HISTORY — PX: TOTAL KNEE ARTHROPLASTY: SHX125

## 2017-12-05 HISTORY — DX: Presence of right artificial knee joint: Z96.651

## 2017-12-05 HISTORY — PX: KNEE CLOSED REDUCTION: SHX995

## 2017-12-05 LAB — TYPE AND SCREEN
ABO/RH(D): A POS
Antibody Screen: NEGATIVE

## 2017-12-05 SURGERY — ARTHROPLASTY, KNEE, TOTAL
Anesthesia: Spinal | Site: Knee | Laterality: Right

## 2017-12-05 MED ORDER — AMLODIPINE BESY-BENAZEPRIL HCL 5-10 MG PO CAPS: 1.0000 | ORAL_CAPSULE | Freq: Every day | ORAL | Status: DC

## 2017-12-05 MED ORDER — HYDROMORPHONE HCL 1 MG/ML IJ SOLN: 0.2500 mg | INTRAMUSCULAR | Status: DC | PRN

## 2017-12-05 MED ORDER — HYDROCODONE-ACETAMINOPHEN 7.5-325 MG PO TABS: 1.0000 | ORAL_TABLET | ORAL | 0 refills | Status: DC | PRN

## 2017-12-05 MED ORDER — PROPOFOL 10 MG/ML IV BOLUS
INTRAVENOUS | Status: AC
  Filled 2017-12-05: qty 40

## 2017-12-05 MED ORDER — DOCUSATE SODIUM 100 MG PO CAPS
100.0000 mg | ORAL_CAPSULE | Freq: Two times a day (BID) | ORAL | Status: DC
  Administered 2017-12-05 – 2017-12-07 (×4): 100 mg via ORAL
  Filled 2017-12-05 (×4): qty 1

## 2017-12-05 MED ORDER — KETOROLAC TROMETHAMINE 30 MG/ML IJ SOLN
INTRAMUSCULAR | Status: AC
  Filled 2017-12-05: qty 1

## 2017-12-05 MED ORDER — BENAZEPRIL HCL 10 MG PO TABS
10.0000 mg | ORAL_TABLET | Freq: Every day | ORAL | Status: DC
  Administered 2017-12-05 – 2017-12-07 (×3): 10 mg via ORAL
  Filled 2017-12-05 (×2): qty 1

## 2017-12-05 MED ORDER — ACETAMINOPHEN 325 MG PO TABS: 325.0000 mg | ORAL_TABLET | Freq: Four times a day (QID) | ORAL | Status: DC | PRN

## 2017-12-05 MED ORDER — MIDAZOLAM HCL 2 MG/2ML IJ SOLN: 0.5000 mg | Freq: Once | INTRAMUSCULAR | Status: DC | PRN

## 2017-12-05 MED ORDER — PROMETHAZINE HCL 25 MG/ML IJ SOLN: 6.2500 mg | INTRAMUSCULAR | Status: DC | PRN

## 2017-12-05 MED ORDER — ALBUTEROL SULFATE (2.5 MG/3ML) 0.083% IN NEBU: 2.5000 mg | INHALATION_SOLUTION | Freq: Four times a day (QID) | RESPIRATORY_TRACT | Status: DC | PRN

## 2017-12-05 MED ORDER — TRANEXAMIC ACID 1000 MG/10ML IV SOLN
1000.0000 mg | INTRAVENOUS | Status: AC
  Administered 2017-12-05: 1000 mg via INTRAVENOUS
  Filled 2017-12-05: qty 1100

## 2017-12-05 MED ORDER — 0.9 % SODIUM CHLORIDE (POUR BTL) OPTIME
TOPICAL | Status: DC | PRN
  Administered 2017-12-05: 1000 mL

## 2017-12-05 MED ORDER — MORPHINE SULFATE (PF) 2 MG/ML IV SOLN
0.5000 mg | INTRAVENOUS | Status: DC | PRN
  Administered 2017-12-05: 1 mg via INTRAVENOUS
  Administered 2017-12-05: 0.5 mg via INTRAVENOUS
  Administered 2017-12-06 (×2): 1 mg via INTRAVENOUS
  Filled 2017-12-05 (×4): qty 1

## 2017-12-05 MED ORDER — BUPIVACAINE HCL (PF) 0.25 % IJ SOLN
INTRAMUSCULAR | Status: DC | PRN
  Administered 2017-12-05: 30 mL

## 2017-12-05 MED ORDER — CHLORHEXIDINE GLUCONATE 4 % EX LIQD: 60.0000 mL | Freq: Once | CUTANEOUS | Status: DC

## 2017-12-05 MED ORDER — ALUM & MAG HYDROXIDE-SIMETH 200-200-20 MG/5ML PO SUSP: 15.0000 mL | ORAL | Status: DC | PRN

## 2017-12-05 MED ORDER — PROPOFOL 500 MG/50ML IV EMUL
INTRAVENOUS | Status: DC | PRN
  Administered 2017-12-05: 100 ug/kg/min via INTRAVENOUS

## 2017-12-05 MED ORDER — HYDROCODONE-ACETAMINOPHEN 5-325 MG PO TABS
1.0000 | ORAL_TABLET | ORAL | Status: DC | PRN
  Administered 2017-12-05 (×2): 1 via ORAL
  Filled 2017-12-05 (×2): qty 1

## 2017-12-05 MED ORDER — ROPIVACAINE HCL 7.5 MG/ML IJ SOLN
INTRAMUSCULAR | Status: DC | PRN
  Administered 2017-12-05: 20 mL via PERINEURAL

## 2017-12-05 MED ORDER — FENTANYL CITRATE (PF) 100 MCG/2ML IJ SOLN
INTRAMUSCULAR | Status: AC
  Administered 2017-12-05: 100 ug via INTRAVENOUS
  Filled 2017-12-05: qty 2

## 2017-12-05 MED ORDER — SODIUM CHLORIDE 0.9 % IV SOLN
INTRAVENOUS | Status: DC
  Administered 2017-12-05: 17:00:00 via INTRAVENOUS

## 2017-12-05 MED ORDER — MIDAZOLAM HCL 2 MG/2ML IJ SOLN
INTRAMUSCULAR | Status: AC
  Administered 2017-12-05: 2 mg via INTRAVENOUS
  Filled 2017-12-05: qty 2

## 2017-12-05 MED ORDER — BUPIVACAINE HCL (PF) 0.25 % IJ SOLN
INTRAMUSCULAR | Status: AC
  Filled 2017-12-05: qty 30

## 2017-12-05 MED ORDER — CEFAZOLIN SODIUM-DEXTROSE 2-4 GM/100ML-% IV SOLN
2.0000 g | INTRAVENOUS | Status: AC
  Administered 2017-12-05: 2 g via INTRAVENOUS
  Filled 2017-12-05: qty 100

## 2017-12-05 MED ORDER — ONDANSETRON HCL 4 MG PO TABS: 4.0000 mg | ORAL_TABLET | Freq: Four times a day (QID) | ORAL | Status: DC | PRN

## 2017-12-05 MED ORDER — PHENYLEPHRINE HCL 10 MG/ML IJ SOLN
INTRAMUSCULAR | Status: DC | PRN
  Administered 2017-12-05 (×7): 40 ug via INTRAVENOUS

## 2017-12-05 MED ORDER — MEPERIDINE HCL 50 MG/ML IJ SOLN: 6.2500 mg | INTRAMUSCULAR | Status: DC | PRN

## 2017-12-05 MED ORDER — PHENOL 1.4 % MT LIQD: 1.0000 | OROMUCOSAL | Status: DC | PRN

## 2017-12-05 MED ORDER — FERROUS SULFATE 325 (65 FE) MG PO TABS
325.0000 mg | ORAL_TABLET | Freq: Three times a day (TID) | ORAL | Status: DC
  Administered 2017-12-06 – 2017-12-07 (×5): 325 mg via ORAL
  Filled 2017-12-05 (×5): qty 1

## 2017-12-05 MED ORDER — DEXAMETHASONE SODIUM PHOSPHATE 10 MG/ML IJ SOLN
INTRAMUSCULAR | Status: DC | PRN
  Administered 2017-12-05: 10 mg via INTRAVENOUS

## 2017-12-05 MED ORDER — MAGNESIUM CITRATE PO SOLN: 1.0000 | Freq: Once | ORAL | Status: DC | PRN

## 2017-12-05 MED ORDER — MIDAZOLAM HCL 2 MG/2ML IJ SOLN
INTRAMUSCULAR | Status: AC
  Filled 2017-12-05: qty 2

## 2017-12-05 MED ORDER — METHOCARBAMOL 500 MG PO TABS: 500.0000 mg | ORAL_TABLET | Freq: Four times a day (QID) | ORAL | 0 refills | Status: DC | PRN

## 2017-12-05 MED ORDER — FENTANYL CITRATE (PF) 100 MCG/2ML IJ SOLN
INTRAMUSCULAR | Status: AC
  Filled 2017-12-05: qty 2

## 2017-12-05 MED ORDER — POLYETHYLENE GLYCOL 3350 17 G PO PACK: 17.0000 g | PACK | Freq: Two times a day (BID) | ORAL | 0 refills | Status: DC

## 2017-12-05 MED ORDER — PHENYLEPHRINE 40 MCG/ML (10ML) SYRINGE FOR IV PUSH (FOR BLOOD PRESSURE SUPPORT)
PREFILLED_SYRINGE | INTRAVENOUS | Status: AC
  Filled 2017-12-05: qty 10

## 2017-12-05 MED ORDER — CEFAZOLIN SODIUM-DEXTROSE 2-4 GM/100ML-% IV SOLN
2.0000 g | Freq: Four times a day (QID) | INTRAVENOUS | Status: AC
  Administered 2017-12-05 – 2017-12-06 (×2): 2 g via INTRAVENOUS
  Filled 2017-12-05 (×2): qty 100

## 2017-12-05 MED ORDER — ONDANSETRON HCL 4 MG/2ML IJ SOLN
INTRAMUSCULAR | Status: DC | PRN
  Administered 2017-12-05: 4 mg via INTRAVENOUS

## 2017-12-05 MED ORDER — BUPIVACAINE IN DEXTROSE 0.75-8.25 % IT SOLN
INTRATHECAL | Status: DC | PRN
  Administered 2017-12-05: 1.6 mL via INTRATHECAL

## 2017-12-05 MED ORDER — ASPIRIN 81 MG PO CHEW: 81.0000 mg | CHEWABLE_TABLET | Freq: Two times a day (BID) | ORAL | 0 refills | Status: AC

## 2017-12-05 MED ORDER — LEVOTHYROXINE SODIUM 50 MCG PO TABS
175.0000 ug | ORAL_TABLET | Freq: Every day | ORAL | Status: DC
  Administered 2017-12-06 – 2017-12-07 (×2): 175 ug via ORAL
  Filled 2017-12-05 (×2): qty 1

## 2017-12-05 MED ORDER — BISACODYL 10 MG RE SUPP: 10.0000 mg | Freq: Every day | RECTAL | Status: DC | PRN

## 2017-12-05 MED ORDER — METHOCARBAMOL 1000 MG/10ML IJ SOLN
500.0000 mg | Freq: Four times a day (QID) | INTRAVENOUS | Status: DC | PRN
  Administered 2017-12-05: 500 mg via INTRAVENOUS
  Filled 2017-12-05: qty 550

## 2017-12-05 MED ORDER — MIDAZOLAM HCL 2 MG/2ML IJ SOLN
1.0000 mg | INTRAMUSCULAR | Status: DC
  Administered 2017-12-05: 1 mg via INTRAVENOUS
  Administered 2017-12-05: 2 mg via INTRAVENOUS
  Administered 2017-12-05: 1 mg via INTRAVENOUS

## 2017-12-05 MED ORDER — SODIUM CHLORIDE 0.9 % IJ SOLN
INTRAMUSCULAR | Status: AC
  Filled 2017-12-05: qty 50

## 2017-12-05 MED ORDER — ASPIRIN 81 MG PO CHEW
81.0000 mg | CHEWABLE_TABLET | Freq: Two times a day (BID) | ORAL | Status: DC
  Administered 2017-12-05 – 2017-12-07 (×4): 81 mg via ORAL
  Filled 2017-12-05 (×4): qty 1

## 2017-12-05 MED ORDER — MENTHOL 3 MG MT LOZG
1.0000 | LOZENGE | OROMUCOSAL | Status: DC | PRN
Start: 2017-12-05 — End: 2017-12-07

## 2017-12-05 MED ORDER — STERILE WATER FOR IRRIGATION IR SOLN
Status: DC | PRN
  Administered 2017-12-05: 2000 mL

## 2017-12-05 MED ORDER — KETOROLAC TROMETHAMINE 30 MG/ML IJ SOLN
INTRAMUSCULAR | Status: DC | PRN
  Administered 2017-12-05: 30 mg

## 2017-12-05 MED ORDER — DIPHENHYDRAMINE HCL 12.5 MG/5ML PO ELIX: 12.5000 mg | ORAL_SOLUTION | ORAL | Status: DC | PRN

## 2017-12-05 MED ORDER — TRANEXAMIC ACID 1000 MG/10ML IV SOLN
1000.0000 mg | Freq: Once | INTRAVENOUS | Status: AC
  Administered 2017-12-05: 1000 mg via INTRAVENOUS
  Filled 2017-12-05: qty 10

## 2017-12-05 MED ORDER — DEXAMETHASONE SODIUM PHOSPHATE 10 MG/ML IJ SOLN
INTRAMUSCULAR | Status: AC
  Filled 2017-12-05: qty 2

## 2017-12-05 MED ORDER — HYDROCODONE-ACETAMINOPHEN 7.5-325 MG PO TABS
1.0000 | ORAL_TABLET | ORAL | Status: DC | PRN
  Administered 2017-12-05 – 2017-12-06 (×3): 2 via ORAL
  Filled 2017-12-05 (×3): qty 2

## 2017-12-05 MED ORDER — METHOCARBAMOL 500 MG PO TABS
500.0000 mg | ORAL_TABLET | Freq: Four times a day (QID) | ORAL | Status: DC | PRN
  Administered 2017-12-05 – 2017-12-07 (×5): 500 mg via ORAL
  Filled 2017-12-05 (×5): qty 1

## 2017-12-05 MED ORDER — FENTANYL CITRATE (PF) 100 MCG/2ML IJ SOLN
INTRAMUSCULAR | Status: DC | PRN
  Administered 2017-12-05 (×2): 50 ug via INTRAVENOUS

## 2017-12-05 MED ORDER — DEXAMETHASONE SODIUM PHOSPHATE 10 MG/ML IJ SOLN
10.0000 mg | Freq: Once | INTRAMUSCULAR | Status: AC
  Administered 2017-12-06: 10 mg via INTRAVENOUS
  Filled 2017-12-05: qty 1

## 2017-12-05 MED ORDER — METOCLOPRAMIDE HCL 5 MG/ML IJ SOLN: 5.0000 mg | Freq: Three times a day (TID) | INTRAMUSCULAR | Status: DC | PRN

## 2017-12-05 MED ORDER — SODIUM CHLORIDE 0.9 % IR SOLN
Status: DC | PRN
  Administered 2017-12-05: 1000 mL

## 2017-12-05 MED ORDER — SODIUM CHLORIDE 0.9 % IJ SOLN
INTRAMUSCULAR | Status: DC | PRN
  Administered 2017-12-05: 30 mL

## 2017-12-05 MED ORDER — AMLODIPINE BESYLATE 5 MG PO TABS
5.0000 mg | ORAL_TABLET | Freq: Every day | ORAL | Status: DC
  Administered 2017-12-05 – 2017-12-07 (×4): 5 mg via ORAL
  Filled 2017-12-05 (×3): qty 1

## 2017-12-05 MED ORDER — CELECOXIB 200 MG PO CAPS
200.0000 mg | ORAL_CAPSULE | Freq: Two times a day (BID) | ORAL | Status: DC
  Administered 2017-12-05 – 2017-12-07 (×4): 200 mg via ORAL
  Filled 2017-12-05 (×4): qty 1

## 2017-12-05 MED ORDER — POLYETHYLENE GLYCOL 3350 17 G PO PACK
17.0000 g | PACK | Freq: Two times a day (BID) | ORAL | Status: DC
  Administered 2017-12-05 – 2017-12-07 (×4): 17 g via ORAL
  Filled 2017-12-05 (×4): qty 1

## 2017-12-05 MED ORDER — ONDANSETRON HCL 4 MG/2ML IJ SOLN
INTRAMUSCULAR | Status: AC
  Filled 2017-12-05: qty 2

## 2017-12-05 MED ORDER — ONDANSETRON HCL 4 MG/2ML IJ SOLN: 4.0000 mg | Freq: Four times a day (QID) | INTRAMUSCULAR | Status: DC | PRN

## 2017-12-05 MED ORDER — FERROUS SULFATE 325 (65 FE) MG PO TABS: 325.0000 mg | ORAL_TABLET | Freq: Three times a day (TID) | ORAL | 3 refills | Status: DC

## 2017-12-05 MED ORDER — METOCLOPRAMIDE HCL 5 MG PO TABS: 5.0000 mg | ORAL_TABLET | Freq: Three times a day (TID) | ORAL | Status: DC | PRN

## 2017-12-05 MED ORDER — DOCUSATE SODIUM 100 MG PO CAPS: 100.0000 mg | ORAL_CAPSULE | Freq: Two times a day (BID) | ORAL | 0 refills | Status: DC

## 2017-12-05 MED ORDER — LACTATED RINGERS IV SOLN
INTRAVENOUS | Status: DC
  Administered 2017-12-05 (×2): via INTRAVENOUS

## 2017-12-05 MED ORDER — FENTANYL CITRATE (PF) 100 MCG/2ML IJ SOLN
50.0000 ug | INTRAMUSCULAR | Status: DC
  Administered 2017-12-05: 100 ug via INTRAVENOUS

## 2017-12-05 SURGICAL SUPPLY — 49 items
BAG DECANTER FOR FLEXI CONT (MISCELLANEOUS) IMPLANT
BAG ZIPLOCK 12X15 (MISCELLANEOUS) IMPLANT
BANDAGE ACE 6X5 VEL STRL LF (GAUZE/BANDAGES/DRESSINGS) ×3 IMPLANT
BLADE SAW SGTL 11.0X1.19X90.0M (BLADE) IMPLANT
BLADE SAW SGTL 13.0X1.19X90.0M (BLADE) ×3 IMPLANT
BOWL SMART MIX CTS (DISPOSABLE) ×3 IMPLANT
CAPT KNEE TOTAL 3 ATTUNE ×3 IMPLANT
CEMENT HV SMART SET (Cement) ×6 IMPLANT
COVER SURGICAL LIGHT HANDLE (MISCELLANEOUS) ×3 IMPLANT
CUFF TOURN SGL QUICK 34 (TOURNIQUET CUFF)
CUFF TOURN SGL QUICK 44 (TOURNIQUET CUFF) ×3 IMPLANT
CUFF TRNQT CYL 34X4X40X1 (TOURNIQUET CUFF) IMPLANT
DECANTER SPIKE VIAL GLASS SM (MISCELLANEOUS) ×3 IMPLANT
DERMABOND ADVANCED (GAUZE/BANDAGES/DRESSINGS) ×1
DERMABOND ADVANCED .7 DNX12 (GAUZE/BANDAGES/DRESSINGS) ×2 IMPLANT
DRAPE TOP 10253 STERILE (DRAPES) IMPLANT
DRAPE U-SHAPE 47X51 STRL (DRAPES) ×3 IMPLANT
DRESSING AQUACEL AG SP 3.5X10 (GAUZE/BANDAGES/DRESSINGS) ×2 IMPLANT
DRSG AQUACEL AG SP 3.5X10 (GAUZE/BANDAGES/DRESSINGS) ×3
DURAPREP 26ML APPLICATOR (WOUND CARE) ×6 IMPLANT
ELECT REM PT RETURN 15FT ADLT (MISCELLANEOUS) ×3 IMPLANT
GLOVE BIOGEL M 7.0 STRL (GLOVE) IMPLANT
GLOVE BIOGEL PI IND STRL 7.5 (GLOVE) ×8 IMPLANT
GLOVE BIOGEL PI IND STRL 9 (GLOVE) IMPLANT
GLOVE BIOGEL PI INDICATOR 7.5 (GLOVE) ×4
GLOVE BIOGEL PI INDICATOR 9 (GLOVE)
GLOVE ECLIPSE 8.5 STRL (GLOVE) IMPLANT
GLOVE ORTHO TXT STRL SZ7.5 (GLOVE) IMPLANT
GLOVE SURG SS PI 7.0 STRL IVOR (GLOVE) ×3 IMPLANT
GLOVE SURG SS PI 7.5 STRL IVOR (GLOVE) ×9 IMPLANT
GOWN STRL REUS W/TWL 2XL LVL3 (GOWN DISPOSABLE) ×3 IMPLANT
GOWN STRL REUS W/TWL LRG LVL3 (GOWN DISPOSABLE) ×6 IMPLANT
GOWN STRL REUS W/TWL XL LVL3 (GOWN DISPOSABLE) ×3 IMPLANT
HANDPIECE INTERPULSE COAX TIP (DISPOSABLE) ×1
MANIFOLD NEPTUNE II (INSTRUMENTS) ×3 IMPLANT
PACK TOTAL KNEE CUSTOM (KITS) ×3 IMPLANT
POSITIONER SURGICAL ARM (MISCELLANEOUS) ×3 IMPLANT
SET HNDPC FAN SPRY TIP SCT (DISPOSABLE) ×2 IMPLANT
SET PAD KNEE POSITIONER (MISCELLANEOUS) ×3 IMPLANT
SUT MNCRL AB 4-0 PS2 18 (SUTURE) ×3 IMPLANT
SUT STRATAFIX PDS+ 0 24IN (SUTURE) ×3 IMPLANT
SUT VIC AB 1 CT1 36 (SUTURE) ×3 IMPLANT
SUT VIC AB 2-0 CT1 27 (SUTURE) ×3
SUT VIC AB 2-0 CT1 TAPERPNT 27 (SUTURE) ×6 IMPLANT
SYR 50ML LL SCALE MARK (SYRINGE) IMPLANT
TRAY FOLEY BAG SILVER LF 16FR (CATHETERS) ×3 IMPLANT
TRAY FOLEY W/METER SILVER 16FR (SET/KITS/TRAYS/PACK) IMPLANT
WRAP KNEE MAXI GEL POST OP (GAUZE/BANDAGES/DRESSINGS) ×3 IMPLANT
YANKAUER SUCT BULB TIP 10FT TU (MISCELLANEOUS) ×3 IMPLANT

## 2017-12-05 NOTE — Anesthesia Procedure Notes (Signed)
Spinal  Patient location during procedure: OR End time: 12/05/2017 12:24 PM Reason for block: procedure for pain Staffing Anesthesiologist: Annye Asa, MD Performed: anesthesiologist  Preanesthetic Checklist Completed: patient identified, site marked, surgical consent, pre-op evaluation, timeout performed, IV checked, risks and benefits discussed and monitors and equipment checked Spinal Block Patient position: sitting Prep: site prepped and draped and DuraPrep Patient monitoring: blood pressure, continuous pulse ox, heart rate and cardiac monitor Approach: midline Location: L3-4 Injection technique: single-shot Needle Needle type: Pencan  Needle gauge: 24 G Needle length: 9 cm Additional Notes Pt identified in Operating room.  Monitors applied. Working IV access confirmed. Sterile prep, drape lumbar spine.  1% lido local L 3,4.  Attempts by CRNA unsuccessful, #[id number] Pencan into clear CSF L 3,4.  12mg  0.75% Bupivacaine with dextrose injected with asp CSF beginning and end of injection.  Patient asymptomatic, VSS, no heme aspirated, tolerated well.  Jenita Seashore, MD

## 2017-12-05 NOTE — Brief Op Note (Signed)
12/05/2017  1:46 PM  PATIENT:  [name]  57 y.o. female  PRE-OPERATIVE DIAGNOSIS:  Right knee osteoarthritis, left knee status post total knee replacements with postoperative stiffness and pain, arthrofibrosis  POST-OPERATIVE DIAGNOSIS:  Right knee osteoarthritis, left knee status post total knee replacements with postoperative stiffness and pain, arthrofibrosis  PROCEDURE:  Procedure(s) with comments: RIGHT TOTAL KNEE ARTHROPLASTY (Right) - 70 mins - charted seperately CLOSED MANIPULATION LEFT KNEE (Left)  SURGEON:  Surgeon(s) and Role:    [name], MD - Primary  PHYSICIAN ASSISTANT: None for manipulation  ANESTHESIA:   regional and spinal  EBL:  None for manipulation  BLOOD ADMINISTERED:none  DRAINS: none   LOCAL MEDICATIONS USED:  NONE for manip  SPECIMEN:  No Specimen  DISPOSITION OF SPECIMEN:  N/A  COUNTS:  YES  TOURNIQUET:   Total Tourniquet Time Documented: Thigh (Right) - 27 minutes Total: Thigh (Right) - 27 minutes   DICTATION: .Other Dictation: Dictation Number [id number]  PLAN OF CARE: Admit to inpatient   PATIENT DISPOSITION:  PACU - hemodynamically stable.   Delay start of Pharmacological VTE agent (>24hrs) due to surgical blood loss or risk of bleeding: no

## 2017-12-05 NOTE — Anesthesia Preprocedure Evaluation (Addendum)
Anesthesia Evaluation  Patient identified by MRN, date of birth, ID band Patient awake    Reviewed: Allergy & Precautions, NPO status , Patient's Chart, lab work & pertinent test results  History of Anesthesia Complications (+) PONV  Airway Mallampati: I  TM Distance: >3 FB Neck ROM: Full    Dental  (+) Dental Advisory Given, Missing   Pulmonary COPD,  COPD inhaler, former smoker (quit 2012),    breath sounds clear to auscultation       Cardiovascular hypertension, Pt. on medications (-) angina Rhythm:Regular Rate:Normal  '09 ECHO: EF 65%, wall motion and valves OK   Neuro/Psych Depression negative neurological ROS     GI/Hepatic negative GI ROS, Neg liver ROS,   Endo/Other  Hypothyroidism Morbid obesity  Renal/GU negative Renal ROS     Musculoskeletal  (+) Arthritis , Osteoarthritis and Rheumatoid disorders,    Abdominal (+) + obese,   Peds  Hematology negative hematology ROS (+)   Anesthesia Other Findings   Reproductive/Obstetrics Post-menopausal                            Anesthesia Physical Anesthesia Plan  ASA: III  Anesthesia Plan: Spinal   Post-op Pain Management:  Regional for Post-op pain   Induction:   PONV Risk Score and Plan: 3 and Ondansetron, Dexamethasone and Treatment may vary due to age or medical condition  Airway Management Planned: Natural Airway and Simple Face Mask  Additional Equipment:   Intra-op Plan:   Post-operative Plan:   Informed Consent: I have reviewed the patients History and Physical, chart, labs and discussed the procedure including the risks, benefits and alternatives for the proposed anesthesia with the patient or authorized representative who has indicated his/her understanding and acceptance.   Dental advisory given  Plan Discussed with: CRNA and Surgeon  Anesthesia Plan Comments: (Plan routine monitors, SAB with adductor canal  block for post op analgesia)        Anesthesia Quick Evaluation

## 2017-12-05 NOTE — Plan of Care (Signed)
Plan of care discussed.   

## 2017-12-05 NOTE — Interval H&P Note (Signed)
History and Physical Interval Note:  12/05/2017 10:51 AM  Paula Jacobs  has presented today for surgery, with the diagnosis of Right knee osteoarthritis  The various methods of treatment have been discussed with the patient and family. After consideration of risks, benefits and other options for treatment, the patient has consented to  Procedure(s) with comments: RIGHT TOTAL KNEE ARTHROPLASTY (Right) - 70 mins as a surgical intervention .  The patient's history has been reviewed, patient examined, no change in status, stable for surgery.  I have reviewed the patient's chart and labs.  Questions were answered to the patient's satisfaction.     [name]

## 2017-12-05 NOTE — Progress Notes (Signed)
AssistedDr. Carswell Jackson with right, ultrasound guided, adductor canal block. Side rails up, monitors on throughout procedure. See vital signs in flow sheet. Tolerated Procedure well.  

## 2017-12-05 NOTE — Transfer of Care (Signed)
Immediate Anesthesia Transfer of Care Note  Patient: Paula Jacobs  Procedure(s) Performed: RIGHT TOTAL KNEE ARTHROPLASTY (Right Knee) CLOSED MANIPULATION LEFT KNEE (Left Knee)  Patient Location: PACU  Anesthesia Type:Spinal  Level of Consciousness: awake, alert , oriented, patient cooperative and responds to stimulation  Airway & Oxygen Therapy: Patient Spontanous Breathing and Patient connected to face mask oxygen  Post-op Assessment: Report given to RN and Post -op Vital signs reviewed and stable  Post vital signs: Reviewed and stable  Last Vitals:  Vitals:   12/05/17 1126 12/05/17 1430  BP:  (P) 123/77  Pulse: 68 (P) 85  Resp: 14 (P) 19  Temp:    SpO2: 99% (P) 100%    Last Pain:  Vitals:   12/05/17 1031  TempSrc:   PainSc: 2       Patients Stated Pain Goal: 4 (78/67/67 2094)  Complications: No apparent anesthesia complications

## 2017-12-05 NOTE — Anesthesia Postprocedure Evaluation (Signed)
Anesthesia Post Note  Patient: Paula Jacobs  Procedure(s) Performed: RIGHT TOTAL KNEE ARTHROPLASTY (Right Knee) CLOSED MANIPULATION LEFT KNEE (Left Knee)     Patient location during evaluation: PACU Anesthesia Type: Spinal Level of consciousness: awake and alert, patient cooperative and oriented Pain management: pain level controlled Vital Signs Assessment: post-procedure vital signs reviewed and stable Respiratory status: spontaneous breathing, nonlabored ventilation, respiratory function stable and patient connected to nasal cannula oxygen Cardiovascular status: blood pressure returned to baseline and stable Postop Assessment: patient able to bend at knees, spinal receding and no apparent nausea or vomiting Anesthetic complications: no    Last Vitals:  Vitals:   12/05/17 1700 12/05/17 1813  BP: (!) 147/74 (!) 147/96  Pulse: 62 75  Resp: 16 16  Temp: (!) 36.4 C 36.9 C  SpO2: 98% 99%    Last Pain:  Vitals:   12/05/17 1808  TempSrc:   PainSc: 5                  Palestine Mosco,E. Erric Machnik

## 2017-12-05 NOTE — Op Note (Signed)
NAME:  [name] RECORD NO.:  [id number]                             FACILITY:  [facility]      PHYSICIAN:  Pietro Cassis. Alvan Dame, M.D.  DATE OF BIRTH:  10/06/1960      DATE OF PROCEDURE:  12/05/2017                                     OPERATIVE REPORT         PREOPERATIVE DIAGNOSIS:  Right knee osteoarthritis.      POSTOPERATIVE DIAGNOSIS:  Right knee osteoarthritis.      FINDINGS:  The patient was noted to have complete loss of cartilage and   bone-on-bone arthritis with associated osteophytes in the lateral and patellofemoral compartments of   the knee with a significant synovitis and associated effusion.      PROCEDURE:  Right total knee replacement.      COMPONENTS USED:  DePuy Attune rotating platform posterior stabilized knee   system, a size 3 femur, 3 tibia, size 6 mm PS AOX insert, and 32 anatomic patellar   button.      SURGEON:  Pietro Cassis. Alvan Dame, M.D.      ASSISTANT:  Nehemiah Massed, PA-C.      ANESTHESIA:  Regional and Spinal.      SPECIMENS:  None.      COMPLICATION:  None.      DRAINS:  None.  EBL: <100cc      TOURNIQUET TIME:   Total Tourniquet Time Documented: Thigh (Right) - 27 minutes Total: Thigh (Right) - 27 minutes  .      The patient was stable to the recovery room.      INDICATION FOR PROCEDURE:  Paula Jacobs is a 57 y.o. female patient of   mine.  The patient had been seen, evaluated, and treated conservatively in the   office with medication, activity modification, and injections.  The patient had   radiographic changes of bone-on-bone arthritis with endplate sclerosis and osteophytes noted.      The patient failed conservative measures including medication, injections, and activity modification, and at this point was ready for more definitive measures.   Based on the radiographic changes and failed conservative measures, the patient   decided to proceed with total knee replacement.  Risks of infection,   DVT, component failure, need for revision surgery, postop course, and   expectations were all   discussed and reviewed.  Consent was obtained for benefit of pain   relief.      PROCEDURE IN DETAIL:  The patient was brought to the operative theater.   Once adequate anesthesia, preoperative antibiotics, 2 gm of Ancef, 1 gm of Tranexamic Acid, and 10 mg of Decadron administered, the patient was positioned supine with the right thigh tourniquet placed.  The  right lower extremity was prepped and draped in sterile fashion.  A time-   out was performed identifying the patient, planned procedure, and   extremity.      The right lower extremity was placed in the [facility] leg holder.  The leg was   exsanguinated, tourniquet elevated to 250 mmHg.  A midline incision was  made followed by median parapatellar arthrotomy.  Following initial   exposure, attention was first directed to the patella.  Precut   measurement was noted to be 22 mm.  I resected down to 14 mm and used a   32 anatomic patellar button to restore patellar height as well as cover the cut   surface.      The lug holes were drilled and a metal shim was placed to protect the   patella from retractors and saw blades.      At this point, attention was now directed to the femur.  The femoral   canal was opened with a drill, irrigated to try to prevent fat emboli.  An   intramedullary rod was passed at 3 degrees valgus, 8 mm of bone was   resected off the distal femur due to pre-operative hyperextension on exam.  Following this resection, the tibia was   subluxated anteriorly.  Using the extramedullary guide, 2 mm of bone was resected off   the proximal latereal tibia.  We confirmed the gap would be   stable medially and laterally with a size 5 spacer block as well as confirmed   the cut was perpendicular in the coronal plane, checking with an alignment rod.      Once this was done, I sized the femur to be a size 34 in the anterior-    posterior dimension, chose a standard component based on medial and   lateral dimension.  The size 3 rotation block was then pinned in   position anterior referenced using the C-clamp to set rotation.  The   anterior, posterior, and  chamfer cuts were made without difficulty nor   notching making certain that I was along the anterior cortex to help   with flexion gap stability.      The final box cut was made off the lateral aspect of distal femur.      At this point, the tibia was sized to be a size 3, the size 3 tray was   then pinned in position through the medial third of the tubercle,   drilled, and keel punched.  Trial reduction was now carried with a 3 femur,  3 tibia, a size 5 then 6 mm PS insert, and the 32 anatomic patella botton.  The knee was brought to   extension, full extension with good flexion stability with the patella   tracking through the trochlea without application of pressure.  Given   all these findings then femoral lug holes were drilled and then the trial components removed.  Final components were   opened and cement was mixed.  The knee was irrigated with normal saline   solution and pulse lavage.  The synovial lining was   then injected with 30 cc of 0.25% Marcaine with epinephrine and 1 cc of Toradol plus 30 cc of NS for a total of 61 cc.      The knee was irrigated.  Final implants were then cemented onto clean and   dried cut surfaces of bone with the knee brought to extension with a size 6 mm PS trial insert.      Once the cement had fully cured, the excess cement was removed   throughout the knee.  I confirmed I was satisfied with the range of   motion and stability, and the final size 6 mm PS AOX insert was chosen.  It was   placed into the knee.  The tourniquet had been let down at 27 minutes.  No significant   hemostasis required.  The   extensor mechanism was then reapproximated using #1 Vicryl and #1 Stratafix sutures with the knee   in  flexion.  The   remaining wound was closed with 2-0 Vicryl and running 4-0 Monocryl.   The knee was cleaned, dried, dressed sterilely using Dermabond and   Aquacel dressing.  The patient was then   brought to recovery room in stable condition, tolerating the procedure   well.   Please note that Physician Assistant, Nehemiah Massed, PA-C, was present for the entirety of the case, and was utilized for pre-operative positioning, peri-operative retractor management, general facilitation of the procedure.  He was also utilized for primary wound closure at the end of the case.              Pietro Cassis Alvan Dame, M.D.    12/05/2017 1:54 PM

## 2017-12-05 NOTE — Discharge Instructions (Signed)

## 2017-12-05 NOTE — Op Note (Addendum)
NAME:  JILLANN, CHARETTE NO.:  [id number]  MEDICAL RECORD NO.:  [id number]  LOCATION:                                 FACILITY:  PHYSICIAN:  Pietro Cassis. Alvan Dame, M.D.       DATE OF BIRTH:  DATE OF PROCEDURE:  12/05/2017 DATE OF DISCHARGE:                              OPERATIVE REPORT   PREOPERATIVE DIAGNOSES: 1. Right knee advanced osteoarthritis and pain. 2. Status post left total knee replacement in 2016 with postoperative     stiffness and pain.  POSTOPERATIVE DIAGNOSES: 1. Right knee advanced osteoarthritis and pain. 2. Status post left total knee replacement in 2016 with postoperative     stiffness and pain.  PROCEDURES: 1. Manipulation under anesthesia. 2. Right total knee replacement performed and charted under a separate     note.  SURGEON:  Pietro Cassis. Alvan Dame, M.D.  ASSISTANT:  For manipulation, none.  COMPLICATIONS:  None.  ANESTHESIA:  Spinal for the procedure.  INDICATIONS FOR PROCEDURE:  Ms. Bohan is a 57 year old female with history of left total knee replacement in 2016.  Her right knee progressively worsened and failed to respond to conservative measure to the point where she wished to proceed with total knee arthroplasty.  We had reviewed the risks and benefits of the total knee replacement.  She is ready to proceed.  At the time of her preoperative assessment, she discussed her concerns with her left knee motion and asked that I assess under anesthesia and manipulate, if possible, her left knee.  She did have a range of motion of close 0 to about 80 to 90 degrees.  I discussed with her the specific risks of persistent pain and stiffness.  She wished to proceed today and consent was obtained.  PROCEDURE IN DETAIL:  The patient was brought to the operative theater. Once adequate anesthesia was established, antibiotics administered for the total knee replacement, we performed the procedure on this left knee.  I first examined the knee  identifying range of motion of 0-90 degrees with the sleep.  Following this, I then applied pressure with the hip flexed along the proximal tibia.  With this, I was able to actually get some manipulation and lysis of adhesion despite the fact that she is greater than 2-1/2 years out from her surgery.  I was able to bend her leg back to have her calf soft tissue touch her hamstring soft tissue in her posterior thigh compartment muscles.  This amounted to about 110 degrees, noted improved with motion.  Once this was completed, this terminated this procedure.  Attention was then directed to the total knee arthroplasty, again charted under a separate note.     Pietro Cassis Alvan Dame, M.D.     MDO/MEDQ  D:  12/05/2017  T:  12/05/2017  Job:  [id number]

## 2017-12-06 DIAGNOSIS — M1711 Unilateral primary osteoarthritis, right knee: Secondary | ICD-10-CM | POA: Diagnosis not present

## 2017-12-06 LAB — BASIC METABOLIC PANEL
Anion gap: 8 (ref 5–15)
BUN: 11 mg/dL (ref 6–20)
CALCIUM: 8.3 mg/dL — AB (ref 8.9–10.3)
CO2: 25 mmol/L (ref 22–32)
CREATININE: 0.76 mg/dL (ref 0.44–1.00)
Chloride: 104 mmol/L (ref 101–111)
GFR calc Af Amer: >60 mL/min (ref >60)
GFR calc non Af Amer: >60 mL/min (ref >60)
GLUCOSE: 170 mg/dL — AB (ref 65–99)
Potassium: 3.9 mmol/L (ref 3.5–5.1)
Sodium: 137 mmol/L (ref 135–145)

## 2017-12-06 LAB — CBC
HCT: 34.7 % — ABNORMAL LOW (ref 36.0–46.0)
Hemoglobin: 10.6 g/dL — ABNORMAL LOW (ref 12.0–15.0)
MCH: 27.1 pg (ref 26.0–34.0)
MCHC: 30.5 g/dL (ref 30.0–36.0)
MCV: 88.7 fL (ref 78.0–100.0)
Platelets: 378 10*3/uL (ref 150–400)
RBC: 3.91 MIL/uL (ref 3.87–5.11)
RDW: 13.8 % (ref 11.5–15.5)
WBC: 12.6 10*3/uL — ABNORMAL HIGH (ref 4.0–10.5)

## 2017-12-06 MED ORDER — ACETAMINOPHEN 500 MG PO TABS: 1000.0000 mg | ORAL_TABLET | Freq: Three times a day (TID) | ORAL | 0 refills | Status: DC

## 2017-12-06 MED ORDER — OXYCODONE HCL 5 MG PO TABS
10.0000 mg | ORAL_TABLET | ORAL | Status: DC | PRN
  Administered 2017-12-06 – 2017-12-07 (×5): 10 mg via ORAL
  Filled 2017-12-06 (×5): qty 2

## 2017-12-06 MED ORDER — ACETAMINOPHEN 500 MG PO TABS
1000.0000 mg | ORAL_TABLET | Freq: Three times a day (TID) | ORAL | Status: DC
  Administered 2017-12-06 – 2017-12-07 (×4): 1000 mg via ORAL
  Filled 2017-12-06 (×4): qty 2

## 2017-12-06 MED ORDER — HYDROMORPHONE HCL 1 MG/ML IJ SOLN: 0.5000 mg | INTRAMUSCULAR | Status: DC | PRN

## 2017-12-06 MED ORDER — OXYCODONE HCL 5 MG PO TABS: 5.0000 mg | ORAL_TABLET | ORAL | 0 refills | Status: DC | PRN

## 2017-12-06 MED ORDER — OXYCODONE HCL 5 MG PO TABS
5.0000 mg | ORAL_TABLET | ORAL | Status: DC | PRN
  Administered 2017-12-06 – 2017-12-07 (×2): 5 mg via ORAL
  Filled 2017-12-06 (×2): qty 1

## 2017-12-06 NOTE — Progress Notes (Signed)
Physical Therapy Treatment Patient Details Name: Paula Jacobs MRN: [id number] DOB: 1960-12-06 Today's Date: 12/06/2017    History of Present Illness Pt is a 57 year old female s/p R TKA and L knee manipulation on 12/05/17    PT Comments    Pt ambulated in hallway and performed LE exercises.  Pt plans to d/c home tomorrow and start outpatient PT on Friday.   Follow Up Recommendations  Follow surgeon's recommendation for DC plan and follow-up therapies     Equipment Recommendations  None recommended by PT    Recommendations for Other Services       Precautions / Restrictions Precautions Precautions: Knee;Fall Restrictions Other Position/Activity Restrictions: WBAT    Mobility  Bed Mobility Overal bed mobility: Needs Assistance Bed Mobility: Supine to Sit;Sit to Supine     Supine to sit: Supervision Sit to supine: Supervision   General bed mobility comments: increased time and effort  Transfers Overall transfer level: Needs assistance Equipment used: Rolling walker (2 wheeled) Transfers: Sit to/from Stand Sit to Stand: Min guard         General transfer comment: verbal cues for safe technique  Ambulation/Gait Ambulation/Gait assistance: Min guard Ambulation Distance (Feet): 140 Feet Assistive device: Rolling walker (2 wheeled) Gait Pattern/deviations: Step-to pattern;Decreased stance time - right;Antalgic     General Gait Details: verbal cues for sequence, RW positioning, heel strike, posture   Stairs            Wheelchair Mobility    Modified Rankin (Stroke Patients Only)       Balance                                            Cognition Arousal/Alertness: Awake/alert Behavior During Therapy: WFL for tasks assessed/performed Overall Cognitive Status: Within Functional Limits for tasks assessed                                        Exercises Total Joint Exercises Ankle Circles/Pumps:  AROM;Both;10 reps Quad Sets: AROM;10 reps;Both Short Arc Quad: 10 reps;Right;AAROM Heel Slides: AAROM;10 reps;Both Hip ABduction/ADduction: 10 reps;Right;AROM Straight Leg Raises: AAROM;10 reps;Right    General Comments        Pertinent Vitals/Pain Pain Assessment: 0-10 Pain Score: 8  Pain Location: R knee Pain Descriptors / Indicators: Sore;Aching Pain Intervention(s): Limited activity within patient's tolerance;Repositioned;Monitored during session;Ice applied    Home Living Family/patient expects to be discharged to:: Private residence Living Arrangements: Spouse/significant other Available Help at Discharge: Family Type of Home: House Home Access: Level entry   Home Layout: One level Home Equipment: Environmental consultant - 2 wheels      Prior Function Level of Independence: Independent          PT Goals (current goals can now be found in the care plan section) Acute Rehab PT Goals PT Goal Formulation: With patient Time For Goal Achievement: 12/11/17 Potential to Achieve Goals: Good Progress towards PT goals: Progressing toward goals    Frequency    7X/week      PT Plan Current plan remains appropriate    Co-evaluation              AM-PAC PT "6 Clicks" Daily Activity  Outcome Measure  Difficulty turning over in bed (including adjusting bedclothes, sheets and blankets)?: None  Difficulty moving from lying on back to sitting on the side of the bed? : A Little Difficulty sitting down on and standing up from a chair with arms (e.g., wheelchair, bedside commode, etc,.)?: A Little Help needed moving to and from a bed to chair (including a wheelchair)?: A Little Help needed walking in hospital room?: A Little Help needed climbing 3-5 steps with a railing? : A Lot 6 Click Score: 18    End of Session   Activity Tolerance: Patient tolerated treatment well Patient left: in bed;with call bell/phone within reach   PT Visit Diagnosis: Other abnormalities of gait and  mobility (R26.89)     Time: 2902-1115 PT Time Calculation (min) (ACUTE ONLY): 18 min  Charges:  $Therapeutic Exercise: 8-22 mins                    G Codes:      Carmelia Bake, PT, DPT 12/06/2017 Pager: [phone]  York Ram E 12/06/2017, 4:02 PM

## 2017-12-06 NOTE — Evaluation (Signed)
Physical Therapy Evaluation Patient Details Name: Paula Jacobs MRN: [id number] DOB: 06/02/1961 Today's Date: 12/06/2017   History of Present Illness  Pt is a 57 year old female s/p R TKA and L knee manipulation on 12/05/17  Clinical Impression  Pt is s/p R TKA resulting in the deficits listed below (see PT Problem List). Pt will benefit from skilled PT to increase their independence and safety with mobility to allow discharge to the venue listed below.  Pt reports increased pain this morning however still agreeable to mobilize.  Pt ambulated short distance in hallway.  Pt reports change in pain meds so hoping pain improved for next session.     Follow Up Recommendations Follow surgeon's recommendation for DC plan and follow-up therapies    Equipment Recommendations  None recommended by PT    Recommendations for Other Services       Precautions / Restrictions Precautions Precautions: Knee;Fall Restrictions Other Position/Activity Restrictions: WBAT      Mobility  Bed Mobility Overal bed mobility: Needs Assistance Bed Mobility: Supine to Sit;Sit to Supine     Supine to sit: Min guard Sit to supine: Min guard   General bed mobility comments: increased time and effort, min/guard for safety   Transfers Overall transfer level: Needs assistance Equipment used: Rolling walker (2 wheeled) Transfers: Sit to/from Stand Sit to Stand: Min guard         General transfer comment: verbal cues for safe technique  Ambulation/Gait Ambulation/Gait assistance: Min guard Ambulation Distance (Feet): 80 Feet Assistive device: Rolling walker (2 wheeled) Gait Pattern/deviations: Step-to pattern;Decreased stance time - right;Antalgic     General Gait Details: verbal cues for sequence, RW positioning, heel strike, posture  Stairs            Wheelchair Mobility    Modified Rankin (Stroke Patients Only)       Balance                                              Pertinent Vitals/Pain Pain Assessment: 0-10 Pain Score: 8  Pain Location: R knee Pain Descriptors / Indicators: Sore;Aching Pain Intervention(s): Limited activity within patient's tolerance;Repositioned;Monitored during session;Premedicated before session;Ice applied    Home Living Family/patient expects to be discharged to:: Private residence Living Arrangements: Spouse/significant other Available Help at Discharge: Family Type of Home: House Home Access: Level entry     Home Layout: One level Home Equipment: Environmental consultant - 2 wheels      Prior Function Level of Independence: Independent               Hand Dominance        Extremity/Trunk Assessment        Lower Extremity Assessment Lower Extremity Assessment: RLE deficits/detail;LLE deficits/detail RLE Deficits / Details: approximately 45* AROM knee flexion, unable to perform SLR LLE Deficits / Details: approximately 90* AROM knee flexion, able to perform SLR       Communication   Communication: No difficulties  Cognition Arousal/Alertness: Awake/alert Behavior During Therapy: WFL for tasks assessed/performed Overall Cognitive Status: Within Functional Limits for tasks assessed                                        General Comments      Exercises  Assessment/Plan    PT Assessment Patient needs continued PT services  PT Problem List Decreased strength;Decreased mobility;Decreased activity tolerance;Pain;Decreased range of motion       PT Treatment Interventions Stair training;Gait training;Patient/family education;Therapeutic exercise;DME instruction;Therapeutic activities;Functional mobility training    PT Goals (Current goals can be found in the Care Plan section)  Acute Rehab PT Goals PT Goal Formulation: With patient Time For Goal Achievement: 12/11/17 Potential to Achieve Goals: Good    Frequency 7X/week   Barriers to discharge        Co-evaluation                AM-PAC PT "6 Clicks" Daily Activity  Outcome Measure Difficulty turning over in bed (including adjusting bedclothes, sheets and blankets)?: None Difficulty moving from lying on back to sitting on the side of the bed? : A Little Difficulty sitting down on and standing up from a chair with arms (e.g., wheelchair, bedside commode, etc,.)?: Unable Help needed moving to and from a bed to chair (including a wheelchair)?: A Little Help needed walking in hospital room?: A Little Help needed climbing 3-5 steps with a railing? : A Lot 6 Click Score: 16    End of Session   Activity Tolerance: Patient tolerated treatment well Patient left: in bed;with call bell/phone within reach   PT Visit Diagnosis: Other abnormalities of gait and mobility (R26.89)    Time: 1610-9604 PT Time Calculation (min) (ACUTE ONLY): 12 min   Charges:   PT Evaluation $PT Eval Low Complexity: 1 Low     PT G CodesCarmelia Bake, PT, DPT 12/06/2017 Pager: [phone]   York Ram E 12/06/2017, 1:11 PM

## 2017-12-06 NOTE — Progress Notes (Signed)
     Subjective: 1 Day Post-Op Procedure(s) (LRB): RIGHT TOTAL KNEE ARTHROPLASTY (Right) CLOSED MANIPULATION LEFT KNEE (Left)   Patient reports pain as moderate, pain not fully controlled. States that last time she was on Oxycodone after surgery and the knee felt better.  She also states that she is very excited with the results of the manipulation of the right knee.  She can already flex her knee more than she could prior to the manipulation. Looking forward to progressing with PT with both knees.   Objective:   VITALS:   Vitals:   12/06/17 0155 12/06/17 0606  BP: (!) 137/92 124/64  Pulse: 84 76  Resp: 18 18  Temp: 98.7 F (37.1 C) 98.4 F (36.9 C)  SpO2: 97% 97%    Dorsiflexion/Plantar flexion intact Incision: dressing C/D/I No cellulitis present Compartment soft  LABS Recent Labs    12/06/17 0612  HGB 10.6*  HCT 34.7*  WBC 12.6*  PLT 378    Recent Labs    12/06/17 0612  NA 137  K 3.9  BUN 11  CREATININE 0.76  GLUCOSE 170*     Assessment/Plan: 1 Day Post-Op Procedure(s) (LRB): RIGHT TOTAL KNEE ARTHROPLASTY (Right) CLOSED MANIPULATION LEFT KNEE (Left) Foley cath d/c'ed Advance diet Up with therapy D/C IV fluids Discharge home eventually when ready, will work on pain control today  Morbid Obesity (BMI >40)  Estimated body mass index is 40.6 kg/m as calculated from the following:   Height as of this encounter: 5\' 2"  (1.575 m).   Weight as of this encounter: 100.7 kg (222 lb). Patient also counseled that weight may inhibit the healing process Patient counseled that losing weight will help with future health issues       West Pugh. Leldon Steege   PAC  12/06/2017, 9:01 AM

## 2017-12-07 DIAGNOSIS — M1711 Unilateral primary osteoarthritis, right knee: Secondary | ICD-10-CM | POA: Diagnosis not present

## 2017-12-07 LAB — BASIC METABOLIC PANEL
Anion gap: 10 (ref 5–15)
BUN: 16 mg/dL (ref 6–20)
CHLORIDE: 103 mmol/L (ref 101–111)
CO2: 27 mmol/L (ref 22–32)
Calcium: 8.3 mg/dL — ABNORMAL LOW (ref 8.9–10.3)
Creatinine, Ser: 0.7 mg/dL (ref 0.44–1.00)
GFR calc Af Amer: >60 mL/min (ref >60)
GFR calc non Af Amer: >60 mL/min (ref >60)
Glucose, Bld: 119 mg/dL — ABNORMAL HIGH (ref 65–99)
POTASSIUM: 3.8 mmol/L (ref 3.5–5.1)
SODIUM: 140 mmol/L (ref 135–145)

## 2017-12-07 LAB — CBC
HCT: 30.8 % — ABNORMAL LOW (ref 36.0–46.0)
HEMOGLOBIN: 9.8 g/dL — AB (ref 12.0–15.0)
MCH: 28 pg (ref 26.0–34.0)
MCHC: 31.8 g/dL (ref 30.0–36.0)
MCV: 88 fL (ref 78.0–100.0)
Platelets: 298 10*3/uL (ref 150–400)
RBC: 3.5 MIL/uL — AB (ref 3.87–5.11)
RDW: 14.1 % (ref 11.5–15.5)
WBC: 12.9 10*3/uL — ABNORMAL HIGH (ref 4.0–10.5)

## 2017-12-07 NOTE — Progress Notes (Signed)
Patient ID: Paula Jacobs, female   DOB: 06/08/1961, 57 y.o.   MRN: [id number] Subjective: 2 Days Post-Op Procedure(s) (LRB): RIGHT TOTAL KNEE ARTHROPLASTY (Right) CLOSED MANIPULATION LEFT KNEE (Left)    Patient reports pain as mild to moderate depending on activity but please nonetheless about left knee movement and excited about making knees as good as possible  Objective:   VITALS:   Vitals:   12/07/17 0800 12/07/17 0822  BP: (!) 150/90   Pulse:  86  Resp:  18  Temp: 98.6 F (37 C)   SpO2:  96%    Neurovascular intact Incision: dressing C/D/I  LABS Recent Labs    12/06/17 0612 12/07/17 0553  HGB 10.6* 9.8*  HCT 34.7* 30.8*  WBC 12.6* 12.9*  PLT 378 298    Recent Labs    12/06/17 0612 12/07/17 0553  NA 137 140  K 3.9 3.8  BUN 11 16  CREATININE 0.76 0.70  GLUCOSE 170* 119*    No results for input(s): LABPT, INR in the last 72 hours.   Assessment/Plan: 2 Days Post-Op Procedure(s) (LRB): RIGHT TOTAL KNEE ARTHROPLASTY (Right) CLOSED MANIPULATION LEFT KNEE (Left)   Up with therapy  Home today after therapy Reviewed goals and follow up, dressing care

## 2017-12-07 NOTE — Anesthesia Procedure Notes (Addendum)
Anesthesia Regional Block: Adductor canal block   Pre-Anesthetic Checklist: ,, timeout performed, Correct Patient, Correct Site, Correct Laterality, Correct Procedure, Correct Position, site marked, Risks and benefits discussed,  Surgical consent,  Pre-op evaluation,  At surgeon's request and post-op pain management  Laterality: Right and Lower  Prep: chloraprep       Needles:  Injection technique: Single-shot  Needle Type: Echogenic Needle     Needle Length: 9cm  Needle Gauge: 21     Additional Needles:   Procedures:,,,, ultrasound used (permanent image in chart),,,,  Narrative:  Start time: 12/05/2017 11:07 AM End time: 12/05/2017 11:13 AM Injection made incrementally with aspirations every 5 mL.  Performed by: Personally  Anesthesiologist: Annye Asa, MD  Additional Notes: Pt identified in Holding room.  Monitors applied. Working IV access confirmed. Sterile prep, drape R thigh.  #21ga ECHOgenic needle into adductor canal with US guidance.  20cc 0.75% Ropivacaine injected incrementally after negative test dose.  Patient asymptomatic, VSS, no heme aspirated, tolerated well.  Jenita Seashore, MD

## 2017-12-07 NOTE — Addendum Note (Signed)
Addendum  created 12/07/17 1004 by Annye Asa, MD   Child order released for a procedure order, Intraprocedure Blocks edited, Sign clinical note

## 2017-12-07 NOTE — Progress Notes (Signed)
Patient discharged to home with family. Given all belongings, instructions, prescriptions. Verbalized understanding of all instructions. Escorted to pov via w/c. 

## 2017-12-07 NOTE — Addendum Note (Signed)
Addendum  created 12/07/17 1000 by Annye Asa, MD   Child order released for a procedure order, Order Canceled from Note

## 2017-12-07 NOTE — Progress Notes (Signed)
Physical Therapy Treatment Patient Details Name: Paula Jacobs MRN: [id number] DOB: 1961-06-19 Today's Date: 12/07/2017    History of Present Illness Pt is a 57 year old female s/p R TKA and L knee manipulation on 12/05/17    PT Comments    Pt performed LE exercises and ambulated in hallway.  Pt feels ready for d/c home today.  Follow Up Recommendations  Follow surgeon's recommendation for DC plan and follow-up therapies     Equipment Recommendations  None recommended by PT    Recommendations for Other Services       Precautions / Restrictions Precautions Precautions: Knee;Fall Restrictions Other Position/Activity Restrictions: WBAT    Mobility  Bed Mobility Overal bed mobility: Needs Assistance Bed Mobility: Supine to Sit;Sit to Supine     Supine to sit: Supervision Sit to supine: Supervision   General bed mobility comments: increased time and effort  Transfers Overall transfer level: Needs assistance Equipment used: Rolling walker (2 wheeled) Transfers: Sit to/from Stand Sit to Stand: Supervision         General transfer comment: verbal cues for safe technique  Ambulation/Gait Ambulation/Gait assistance: Min guard;Supervision Ambulation Distance (Feet): 140 Feet Assistive device: Rolling walker (2 wheeled) Gait Pattern/deviations: Step-to pattern;Decreased stance time - right;Antalgic     General Gait Details: verbal cues for RW positioning, heel strike, posture   Stairs            Wheelchair Mobility    Modified Rankin (Stroke Patients Only)       Balance                                            Cognition Arousal/Alertness: Awake/alert Behavior During Therapy: WFL for tasks assessed/performed Overall Cognitive Status: Within Functional Limits for tasks assessed                                        Exercises Total Joint Exercises Ankle Circles/Pumps: AROM;Both;10 reps Quad Sets: AROM;10  reps;Both Short Arc Quad: 10 reps;Right;AAROM Heel Slides: AAROM;10 reps;Both Hip ABduction/ADduction: 10 reps;Right;AROM Straight Leg Raises: AAROM;10 reps;Right Goniometric ROM: L AAROM knee flexion 100*, R AAROM knee flexion 45*    General Comments        Pertinent Vitals/Pain Pain Assessment: 0-10 Pain Score: 6  Pain Location: R knee Pain Descriptors / Indicators: Sore;Aching Pain Intervention(s): Limited activity within patient's tolerance;Repositioned;Monitored during session;Ice applied    Home Living                      Prior Function            PT Goals (current goals can now be found in the care plan section) Progress towards PT goals: Progressing toward goals    Frequency    7X/week      PT Plan Current plan remains appropriate    Co-evaluation              AM-PAC PT "6 Clicks" Daily Activity  Outcome Measure  Difficulty turning over in bed (including adjusting bedclothes, sheets and blankets)?: None Difficulty moving from lying on back to sitting on the side of the bed? : A Little Difficulty sitting down on and standing up from a chair with arms (e.g., wheelchair, bedside commode, etc,.)?: A Little Help needed  moving to and from a bed to chair (including a wheelchair)?: A Little Help needed walking in hospital room?: A Little Help needed climbing 3-5 steps with a railing? : A Little 6 Click Score: 19    End of Session   Activity Tolerance: Patient tolerated treatment well Patient left: in chair;with family/visitor present;with call bell/phone within reach   PT Visit Diagnosis: Other abnormalities of gait and mobility (R26.89)     Time: 1610-9604 PT Time Calculation (min) (ACUTE ONLY): 23 min  Charges:  $Gait Training: 8-22 mins $Therapeutic Exercise: 8-22 mins                    G Codes:       Carmelia Bake, PT, DPT 12/07/2017 Pager: [phone]  York Ram E 12/07/2017, 12:20 PM

## 2017-12-07 NOTE — Addendum Note (Signed)
Addendum  created 12/07/17 1047 by Annye Asa, MD   Intraprocedure Blocks edited, Sign clinical note

## 2017-12-11 NOTE — Discharge Summary (Signed)
Physician Discharge Summary  Patient ID: Paula Jacobs MRN: [id number] DOB/AGE: December 04, 1960 57 y.o.  Admit date: 12/05/2017 Discharge date: 12/07/2017   Procedures:  Procedure(s) (LRB): RIGHT TOTAL KNEE ARTHROPLASTY (Right) CLOSED MANIPULATION LEFT KNEE (Left)  Attending Physician:  Dr. [name]   Admission Diagnoses:   Right knee primary OA / pain  Discharge Diagnoses:  Principal Problem:   S/P right TKA Active Problems:   S/P total knee replacement  Past Medical History:  Diagnosis Date  . ANEMIA-NOS 04/23/2008  . 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 Catalina Gravel  . Anginal pain ([place])    went to ED in april 2018 c/o chest pain over last 2 months ; had EKG, CXR  and troponin negative per physician suspected musculoskeletal  ; dc'd with ibuprofen  and recc f/u with outpatient stress test; see care everywhere ED visit  ; patient denies recurrence of Chest pain since, endorses occ palpitations   . Coughing    only coughing up clear sputum; to see her PCP today at 2:45 to address sx   . DEPRESSION 04/23/2008  . DJD (degenerative joint disease)   . Hyperplastic colon polyp    x2  . Hypertension   . HYPOTHYROIDISM 10/17/2007  . Osteoarthritis   . Palpitations    freq at night;  at pre-op states she drinks caffeine and that makes it worse; says the palpitations started after they took her thyroid   . PAT 10/27/2009   Qualifier: Diagnosis of  By: [name], MD, [name]    . PONV (postoperative nausea and vomiting)    only after 1979 surgery  . RA (rheumatoid arthritis) ([place])    "problems in feet, hands, and knees"  . SVT (supraventricular tachycardia) (HCC)    chronic   . Tubulovillous adenoma of colon     HPI:    Paula Jacobs, 57 y.o. female, has a history of pain and functional disability in the right knee due to arthritis and has failed non-surgical conservative treatments for greater than 12 weeks to include NSAID's and/or analgesics, corticosteriod  injections and activity modification.  Onset of symptoms was gradual, starting 2 years ago with gradually worsening course since that time. The patient noted prior procedures on the knee to include  arthroplasty on the left knee per Dr. Alvan Dame 3-4 yrs ago.  Patient currently rates pain in the right knee(s) at 8 out of 10 with activity. Patient has night pain, worsening of pain with activity and weight bearing, pain that interferes with activities of daily living, pain with passive range of motion, crepitus and joint swelling.  Patient has evidence of periarticular osteophytes and joint space narrowing by imaging studies.  There is no active infection.  Risks, benefits and expectations were discussed with the patient.  Risks including but not limited to the risk of anesthesia, blood clots, nerve damage, blood vessel damage, failure of the prosthesis, infection and up to and including death.  Patient understand the risks, benefits and expectations and wishes to proceed with surgery.  PCP: [name], DO   Discharged Condition: good  Hospital Course:  Patient underwent the above stated procedure on 12/05/2017. Patient tolerated the procedure well and brought to the recovery room in good condition and subsequently to the floor.  POD #1 BP: 124/64 ; Pulse: 76 ; Temp: 98.4 F (36.9 C) ; Resp: 18 Patient reports pain as moderate, pain not fully controlled. States that last time she was on Oxycodone after surgery and the  knee felt better.  She also states that she is very excited with the results of the manipulation of the right knee.  She can already flex her knee more than she could prior to the manipulation. Looking forward to progressing with PT with both knees.  Dorsiflexion/plantar flexion intact, incision: dressing C/D/I, no cellulitis present and compartment soft.   LABS  Basename    HGB     10.6  HCT     34.7   POD #2  BP: 150/90 ; Pulse: 86 ; Temp: 98.6 F (37 C) ; Resp: 18 Patient reports  pain as mild to moderate depending on activity but please nonetheless about left knee movement and excited about making knees as good as possible. Neurovascular intact and incision: dressing C/D/I.   LABS  Basename    HGB     9.8  HCT     30.8    Discharge Exam: General appearance: alert, cooperative and no distress Extremities: Homans sign is negative, no sign of DVT, no edema, redness or tenderness in the calves or thighs and no ulcers, gangrene or trophic changes  Disposition:  Home with follow up in 2 weeks   Follow-up Information    [name], MD. Schedule an appointment as soon as possible for a visit in 2 week(s).   Specialty:  Orthopedic Surgery Contact information: [address] [place] [address] [phone]           Discharge Instructions    Call MD / Call 911   Complete by:  As directed    If you experience chest pain or shortness of breath, CALL 911 and be transported to the hospital emergency room.  If you develope a fever above 101 F, pus (white drainage) or increased drainage or redness at the wound, or calf pain, call your surgeon's office.   Change dressing   Complete by:  As directed    Maintain surgical dressing until follow up in the clinic. If the edges start to pull up, may reinforce with tape. If the dressing is no longer working, may remove and cover with gauze and tape, but must keep the area dry and clean.  Call with any questions or concerns.   Constipation Prevention   Complete by:  As directed    Drink plenty of fluids.  Prune juice may be helpful.  You may use a stool softener, such as Colace (over the counter) 100 mg twice a day.  Use MiraLax (over the counter) for constipation as needed.   Diet - low sodium heart healthy   Complete by:  As directed    Discharge instructions   Complete by:  As directed    Maintain surgical dressing until follow up in the clinic. If the edges start to pull up, may reinforce with tape.  If the dressing is no longer working, may remove and cover with gauze and tape, but must keep the area dry and clean.  Follow up in 2 weeks at [facility]. Call with any questions or concerns.   Increase activity slowly as tolerated   Complete by:  As directed    Weight bearing as tolerated with assist device (walker, cane, etc) as directed, use it as long as suggested by your surgeon or therapist, typically at least 4-6 weeks.   TED hose   Complete by:  As directed    Use stockings (TED hose) for 2 weeks on both leg(s).  You may remove them at night for sleeping.  Allergies as of 12/07/2017      Reactions   Hydrocodone-acetaminophen Itching   Latex Itching   Tramadol Hcl Itching, Nausea Only      Medication List    STOP taking these medications   benzonatate 100 MG capsule Commonly known as:  TESSALON PERLES   ibuprofen 200 MG tablet Commonly known as:  ADVIL,MOTRIN   naproxen sodium 220 MG tablet Commonly known as:  ALEVE   venlafaxine XR 75 MG 24 hr capsule Commonly known as:  EFFEXOR XR     TAKE these medications   acetaminophen 500 MG tablet Commonly known as:  TYLENOL Take 2 tablets (1,000 mg total) by mouth every 8 (eight) hours.   albuterol 108 (90 Base) MCG/ACT inhaler Commonly known as:  PROVENTIL HFA;VENTOLIN HFA Inhale 2 puffs into the lungs every 6 (six) hours as needed. What changed:  reasons to take this   amLODipine-benazepril 5-10 MG capsule Commonly known as:  LOTREL Take 1 capsule by mouth daily.   aspirin 81 MG chewable tablet Commonly known as:  ASPIRIN CHILDRENS Chew 1 tablet (81 mg total) by mouth 2 (two) times daily. Take for 4 weeks, then resume regular dose.   docusate sodium 100 MG capsule Commonly known as:  COLACE Take 1 capsule (100 mg total) by mouth 2 (two) times daily.   ferrous sulfate 325 (65 FE) MG tablet Commonly known as:  FERROUSUL Take 1 tablet (325 mg total) by mouth 3 (three) times daily with meals.     levothyroxine 175 MCG tablet Commonly known as:  SYNTHROID, LEVOTHROID Take 1 tablet (175 mcg total) by mouth every morning. Pt. Needs to schedule an appointment. What changed:    when to take this  additional instructions   methocarbamol 500 MG tablet Commonly known as:  ROBAXIN Take 1 tablet (500 mg total) by mouth every 6 (six) hours as needed for muscle spasms.   oxyCODONE 5 MG immediate release tablet Commonly known as:  Oxy IR/ROXICODONE Take 1-2 tablets (5-10 mg total) by mouth every 4 (four) hours as needed for moderate pain or severe pain.   polyethylene glycol packet Commonly known as:  MIRALAX / GLYCOLAX Take 17 g by mouth 2 (two) times daily.            Discharge Care Instructions  (From admission, onward)        Start     Ordered   12/06/17 0000  Change dressing    Comments:  Maintain surgical dressing until follow up in the clinic. If the edges start to pull up, may reinforce with tape. If the dressing is no longer working, may remove and cover with gauze and tape, but must keep the area dry and clean.  Call with any questions or concerns.   12/06/17 2202       Signed: West Pugh. Harim Bi   PA-C  12/11/2017, 1:50 PM

## 2018-03-14 ENCOUNTER — Other Ambulatory Visit: Payer: Self-pay | Admitting: Family Medicine

## 2018-03-19 ENCOUNTER — Ambulatory Visit: Payer: Self-pay | Admitting: *Deleted

## 2018-03-19 NOTE — Progress Notes (Signed)
HPI:  Using dictation device. Unfortunately this device frequently misinterprets words/phrases.  Paula Jacobs is a pleasant 57 y.o. here for follow up. Chronic medical problems summarized below were reviewed for changes and stability and were updated as needed below. These issues and their treatment remain stable for the most part. Reports is been doing well for the most part.  Has been exercising more and reports has been able to lose a few pounds.  Reports has had more swelling in her ankles with her blood pressure medication.  Denies CP, SOB, bad mood, DOE, treatment intolerance or new symptoms. -due for mammo, pap, labs Agrees to schedule her Pap smear with her gynecologist.  HTN, Hx palpitations: -medications: amlodipine-benazepril -saw cardiology in the past  Brain Aneurysm: -sees neurologist  Hypothyroidism: -meds: levothyoxine  Rheumatoid arthritis: -sees rheumatology  Morbid obesity:  Chronic KNee Pain: -sees ortho, Dr. Alvan Dame -s/p R knee arthroscopy  ROS: See pertinent positives and negatives per HPI.  Past Medical History:  Diagnosis Date  . ANEMIA-NOS 04/23/2008  . 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 Catalina Gravel  . Anginal pain ([place])    went to ED in april 2018 c/o chest pain over last 2 months ; had EKG, CXR  and troponin negative per physician suspected musculoskeletal  ; dc'd with ibuprofen  and recc f/u with outpatient stress test; see care everywhere ED visit  ; patient denies recurrence of Chest pain since, endorses occ palpitations   . Coughing    only coughing up clear sputum; to see her PCP today at 2:45 to address sx   . DEPRESSION 04/23/2008  . DJD (degenerative joint disease)   . Hyperplastic colon polyp    x2  . Hypertension   . HYPOTHYROIDISM 10/17/2007  . Osteoarthritis   . Palpitations    freq at night;  at pre-op states she drinks caffeine and that makes it worse; says the palpitations started after they took her thyroid   . PAT  10/27/2009   Qualifier: Diagnosis of  By: [name], MD, [name]    . PONV (postoperative nausea and vomiting)    only after 1979 surgery  . RA (rheumatoid arthritis) ([place])    "problems in feet, hands, and knees"  . 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 SVT (supraventricular tachycardia) (HCC)    chronic   . Tubulovillous adenoma of colon     Past Surgical History:  Procedure Laterality Date  . ABDOMINAL HYSTERECTOMY    . APPENDECTOMY    . BUNIONECTOMY  10/2009 right & 02/03/10 left  . COLONOSCOPY W/ POLYPECTOMY  2015  . HAMMER TOE SURGERY     "all toes have pins, done with bunionectomy"  . INGUINAL HERNIA REPAIR    . KNEE CLOSED REDUCTION Left 12/05/2017   Procedure: CLOSED MANIPULATION LEFT KNEE;  Surgeon: [name], MD;  Location: [facility];  Service: Orthopedics;  Laterality: Left;  . neck fusion     x3  . THYROIDECTOMY  2001   for nodules  . TOE AMPUTATION  1979   6th toe removed from each foot  . TOTAL KNEE ARTHROPLASTY Left 10/21/2014   Procedure: LEFT TOTAL KNEE ARTHROPLASTY;  Surgeon: [name], MD;  Location: [facility];  Service: Orthopedics;  Laterality: Left;  . TOTAL KNEE ARTHROPLASTY Right 12/05/2017   Procedure: RIGHT TOTAL KNEE ARTHROPLASTY;  Surgeon: [name], MD;  Location: [facility];  Service: Orthopedics;  Laterality: Right;  70 mins    Family  History  Problem Relation Age of Onset  . Colon cancer Mother   . Dementia Father   . Heart attack Maternal Grandmother   . Stroke Maternal Grandmother   . Diabetes Maternal Grandmother   . Heart disease Maternal Grandmother   . Thyroid disease Maternal Grandmother   . Esophageal cancer Neg Hx   . Rectal cancer Neg Hx   . Stomach cancer Neg Hx     SOCIAL HX: See HPI   Current Outpatient Medications:  .  acetaminophen (TYLENOL) 500 MG tablet, Take 2 tablets (1,000 mg total) by mouth every 8 (eight) hours., Disp: 30 tablet, Rfl: 0 .  albuterol (PROVENTIL HFA;VENTOLIN HFA) 108 (90 Base) MCG/ACT  inhaler, Inhale 2 puffs into the lungs every 6 (six) hours as needed. (Patient taking differently: Inhale 2 puffs into the lungs every 6 (six) hours as needed for wheezing or shortness of breath. ), Disp: 1 Inhaler, Rfl: 0 .  ferrous sulfate (FERROUSUL) 325 (65 FE) MG tablet, Take 1 tablet (325 mg total) by mouth 3 (three) times daily with meals., Disp: , Rfl: 3 .  levothyroxine (SYNTHROID, LEVOTHROID) 175 MCG tablet, TAKE 1 TABLET BY MOUTH EVERY MORNING, Disp: 30 tablet, Rfl: 0  EXAM:  Vitals:   03/20/18 0737  BP: 118/78  Pulse: 96  Temp: 98.2 F (36.8 C)  SpO2: 98%    Body mass index is 37.66 kg/m.  GENERAL: vitals reviewed and listed above, alert, oriented, appears well hydrated and in no acute distress  HEENT: atraumatic, conjunttiva clear, no obvious abnormalities on inspection of external nose and ears  NECK: no obvious masses on inspection  LUNGS: clear to auscultation bilaterally, no wheezes, rales or rhonchi, good air movement  CV: HRRR, trace ankle edema bilaterally  MS: moves all extremities without noticeable abnormality  PSYCH: pleasant and cooperative, no obvious depression or anxiety  ASSESSMENT AND PLAN:  Discussed the following assessment and plan:  Essential hypertension  Hypothyroidism, unspecified type  Morbid obesity (HCC)  Rheumatoid arthritis, involving unspecified site, unspecified rheumatoid factor presence ([place])  -Discussed options for the lower extremity edema and recommended a healthy low sugar, low sodium diet and continued exercise -Also discussed compression and change in medication -Opted to change to benazepril HCTZ rather than benazepril amlodipine -Labs today -Follow-up in 1 month -Patient advised to return or notify a doctor immediately if symptoms worsen or persist or new concerns arise.  Patient Instructions  BEFORE YOU LEAVE: -labs -place referral from mammogram at breast center (GI) -follow up: 1 month HTN/edema  STOP  current blood pressure medication. Pick up new blood pressure medication and take once daily (benazapril-hctz).  Call today to schedule your pap.  We have ordered labs or studies at this visit. It can take up to 1-2 weeks for results and processing. IF results require follow up or explanation, we will call you with instructions. Clinically stable results will be released to your [facility]. If you have not heard from [place] or cannot find your results in [facility] in 2 weeks please contact our office at [phone].  If you are not yet signed up for [facility], please consider signing up.           [name], DO

## 2018-03-19 NOTE — Telephone Encounter (Signed)
Left VM to call back if needed.

## 2018-03-20 ENCOUNTER — Ambulatory Visit (INDEPENDENT_AMBULATORY_CARE_PROVIDER_SITE_OTHER): Payer: Managed Care, Other (non HMO) | Admitting: Family Medicine

## 2018-03-20 ENCOUNTER — Encounter: Payer: Self-pay | Admitting: Family Medicine

## 2018-03-20 VITALS — BP 118/78 | HR 96 | Temp 98.2°F | Ht 62.0 in | Wt 205.9 lb

## 2018-03-20 DIAGNOSIS — E039 Hypothyroidism, unspecified: Secondary | ICD-10-CM | POA: Diagnosis not present

## 2018-03-20 DIAGNOSIS — M069 Rheumatoid arthritis, unspecified: Secondary | ICD-10-CM | POA: Diagnosis not present

## 2018-03-20 DIAGNOSIS — Z1239 Encounter for other screening for malignant neoplasm of breast: Secondary | ICD-10-CM

## 2018-03-20 DIAGNOSIS — I10 Essential (primary) hypertension: Secondary | ICD-10-CM

## 2018-03-20 DIAGNOSIS — Z1231 Encounter for screening mammogram for malignant neoplasm of breast: Secondary | ICD-10-CM

## 2018-03-20 LAB — TSH: TSH: 2.08 u[IU]/mL (ref 0.35–4.50)

## 2018-03-20 LAB — CBC
HEMATOCRIT: 35.4 % — AB (ref 36.0–46.0)
HEMOGLOBIN: 11.7 g/dL — AB (ref 12.0–15.0)
MCHC: 32.9 g/dL (ref 30.0–36.0)
MCV: 83.2 fl (ref 78.0–100.0)
Platelets: 406 10*3/uL — ABNORMAL HIGH (ref 150.0–400.0)
RBC: 4.26 Mil/uL (ref 3.87–5.11)
RDW: 14.3 % (ref 11.5–15.5)
WBC: 6.9 10*3/uL (ref 4.0–10.5)

## 2018-03-20 LAB — BASIC METABOLIC PANEL WITH GFR
BUN: 10 mg/dL (ref 6–23)
CO2: 27 meq/L (ref 19–32)
Calcium: 8.9 mg/dL (ref 8.4–10.5)
Chloride: 105 meq/L (ref 96–112)
Creatinine, Ser: 0.55 mg/dL (ref 0.40–1.20)
GFR: 147.37 mL/min (ref >60.00)
Glucose, Bld: 91 mg/dL (ref 70–99)
Potassium: 3.6 meq/L (ref 3.5–5.1)
Sodium: 142 meq/L (ref 135–145)

## 2018-03-20 MED ORDER — BENAZEPRIL-HYDROCHLOROTHIAZIDE 10-12.5 MG PO TABS: 1.0000 | ORAL_TABLET | Freq: Every day | ORAL | 1 refills | Status: DC

## 2018-03-20 NOTE — Patient Instructions (Addendum)
BEFORE YOU LEAVE: -labs -place referral from mammogram at breast center (GI) -follow up: 1 month HTN/edema  STOP current blood pressure medication. Pick up new blood pressure medication and take once daily (benazapril-hctz).  Call today to schedule your pap.  We have ordered labs or studies at this visit. It can take up to 1-2 weeks for results and processing. IF results require follow up or explanation, we will call you with instructions. Clinically stable results will be released to your [facility]. If you have not heard from [place] or cannot find your results in [facility] in 2 weeks please contact our office at [phone].  If you are not yet signed up for [facility], please consider signing up.

## 2018-03-20 NOTE — Addendum Note (Signed)
Addended by: Agnes Lawrence on: 03/20/2018 08:03 AM   Modules accepted: Orders

## 2018-04-05 ENCOUNTER — Telehealth: Payer: Self-pay

## 2018-04-05 NOTE — Telephone Encounter (Signed)
Patient returning call about lab results  Copied from [place] [phone]. Topic: Quick Communication - Office Called Patient >> Apr 05, 2018 12:39 PM Synthia Innocent wrote: Reason for [id number] returning call

## 2018-04-05 NOTE — Telephone Encounter (Signed)
See results note. 

## 2018-04-12 ENCOUNTER — Ambulatory Visit: Payer: Managed Care, Other (non HMO) | Admitting: Family Medicine

## 2018-04-12 DIAGNOSIS — Z0289 Encounter for other administrative examinations: Secondary | ICD-10-CM

## 2018-05-15 ENCOUNTER — Encounter: Payer: Self-pay | Admitting: Family Medicine

## 2018-05-15 ENCOUNTER — Ambulatory Visit (INDEPENDENT_AMBULATORY_CARE_PROVIDER_SITE_OTHER): Payer: Managed Care, Other (non HMO) | Admitting: Family Medicine

## 2018-05-15 VITALS — BP 118/80 | HR 75 | Temp 98.2°F | Ht 62.0 in | Wt 204.3 lb

## 2018-05-15 DIAGNOSIS — L853 Xerosis cutis: Secondary | ICD-10-CM

## 2018-05-15 DIAGNOSIS — R5383 Other fatigue: Secondary | ICD-10-CM

## 2018-05-15 DIAGNOSIS — D649 Anemia, unspecified: Secondary | ICD-10-CM

## 2018-05-15 DIAGNOSIS — R7989 Other specified abnormal findings of blood chemistry: Secondary | ICD-10-CM | POA: Diagnosis not present

## 2018-05-15 DIAGNOSIS — H6121 Impacted cerumen, right ear: Secondary | ICD-10-CM | POA: Diagnosis not present

## 2018-05-15 LAB — CBC WITH DIFFERENTIAL/PLATELET
BASOS ABS: 0 10*3/uL (ref 0.0–0.1)
Basophils Relative: 0.8 % (ref 0.0–3.0)
EOS ABS: 0.1 10*3/uL (ref 0.0–0.7)
Eosinophils Relative: 2.2 % (ref 0.0–5.0)
HCT: 34 % — ABNORMAL LOW (ref 36.0–46.0)
Hemoglobin: 11.1 g/dL — ABNORMAL LOW (ref 12.0–15.0)
LYMPHS ABS: 1.9 10*3/uL (ref 0.7–4.0)
LYMPHS PCT: 30.5 % (ref 12.0–46.0)
MCHC: 32.6 g/dL (ref 30.0–36.0)
MCV: 82.7 fl (ref 78.0–100.0)
MONOS PCT: 5.4 % (ref 3.0–12.0)
Monocytes Absolute: 0.3 10*3/uL (ref 0.1–1.0)
Neutro Abs: 3.8 10*3/uL (ref 1.4–7.7)
Neutrophils Relative %: 61.1 % (ref 43.0–77.0)
Platelets: 430 10*3/uL — ABNORMAL HIGH (ref 150.0–400.0)
RBC: 4.11 Mil/uL (ref 3.87–5.11)
RDW: 13.6 % (ref 11.5–15.5)
WBC: 6.3 10*3/uL (ref 4.0–10.5)

## 2018-05-15 NOTE — Patient Instructions (Addendum)
BEFORE YOU LEAVE: -lab -follow up: 3 months  Debrox ear drops for 1 week. Come back in for appointment if any pain or discomfort.  Aquaphor daily for skin. Gentle soap such as dove or cerave.  Follow up promptly if you are feeling tired or depressed again.

## 2018-05-15 NOTE — Progress Notes (Signed)
HPI:  Using dictation device. Unfortunately this device frequently misinterprets words/phrases.  Acute visit for several issues:  1) R ear discomfort: -feels full and uncomfortable -hx of cerumen impaction -mild nasal congestion at times, but no severe UR symptoms -other ear ok  2)dry itchy skin: -her whole life -uses dial soap -no emollient -itching can be worse at certain times, no rash  3)Fatigue: -occurred about 1 month ago -thinks she was mildly depressed -lack of motivation and energy for a few weeks -we asked her to come in to evaluated at the time, but she reports these symptoms resolved, so she did not come in -sees rheumatologist for RA, also has hx chronic anemia -platelets mildly elevated labs lst month and advised recheck -thyroid and other labs stable  ROS: See pertinent positives and negatives per HPI.  Past Medical History:  Diagnosis Date  . ANEMIA-NOS 04/23/2008  . 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 Catalina Gravel  . Anginal pain ([place])    went to ED in april 2018 c/o chest pain over last 2 months ; had EKG, CXR  and troponin negative per physician suspected musculoskeletal  ; dc'd with ibuprofen  and recc f/u with outpatient stress test; see care everywhere ED visit  ; patient denies recurrence of Chest pain since, endorses occ palpitations   . Coughing    only coughing up clear sputum; to see her PCP today at 2:45 to address sx   . DEPRESSION 04/23/2008  . DJD (degenerative joint disease)   . Hyperplastic colon polyp    x2  . Hypertension   . HYPOTHYROIDISM 10/17/2007  . Osteoarthritis   . Palpitations    freq at night;  at pre-op states she drinks caffeine and that makes it worse; says the palpitations started after they took her thyroid   . PAT 10/27/2009   Qualifier: Diagnosis of  By: [name], MD, [name]    . PONV (postoperative nausea and vomiting)    only after 1979 surgery  . RA (rheumatoid arthritis) ([place])    "problems in feet, hands,  and knees"  . 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 SVT (supraventricular tachycardia) ([facility])    chronic   . Tubulovillous adenoma of colon     Past Surgical History:  Procedure Laterality Date  . ABDOMINAL HYSTERECTOMY    . APPENDECTOMY    . BUNIONECTOMY  10/2009 right & 02/03/10 left  . COLONOSCOPY W/ POLYPECTOMY  2015  . HAMMER TOE SURGERY     "all toes have pins, done with bunionectomy"  . INGUINAL HERNIA REPAIR    . KNEE CLOSED REDUCTION Left 12/05/2017   Procedure: CLOSED MANIPULATION LEFT KNEE;  Surgeon: [name], MD;  Location: [facility];  Service: Orthopedics;  Laterality: Left;  . neck fusion     x3  . THYROIDECTOMY  2001   for nodules  . TOE AMPUTATION  1979   6th toe removed from each foot  . TOTAL KNEE ARTHROPLASTY Left 10/21/2014   Procedure: LEFT TOTAL KNEE ARTHROPLASTY;  Surgeon: [name], MD;  Location: [facility];  Service: Orthopedics;  Laterality: Left;  . TOTAL KNEE ARTHROPLASTY Right 12/05/2017   Procedure: RIGHT TOTAL KNEE ARTHROPLASTY;  Surgeon: [name], MD;  Location: [facility];  Service: Orthopedics;  Laterality: Right;  70 mins    Family History  Problem Relation Age of Onset  . Colon cancer Mother   . Dementia Father   . Heart attack Maternal Grandmother   .  Stroke Maternal Grandmother   . Diabetes Maternal Grandmother   . Heart disease Maternal Grandmother   . Thyroid disease Maternal Grandmother   . Esophageal cancer Neg Hx   . Rectal cancer Neg Hx   . Stomach cancer Neg Hx     SOCIAL HX: see hpi   Current Outpatient Medications:  .  albuterol (PROVENTIL HFA;VENTOLIN HFA) 108 (90 Base) MCG/ACT inhaler, Inhale 2 puffs into the lungs every 6 (six) hours as needed. (Patient taking differently: Inhale 2 puffs into the lungs every 6 (six) hours as needed for wheezing or shortness of breath. ), Disp: 1 Inhaler, Rfl: 0 .  benazepril-hydrochlorthiazide (LOTENSIN HCT) 10-12.5 MG tablet, Take 1 tablet by mouth daily., Disp: 90  tablet, Rfl: 1 .  levothyroxine (SYNTHROID, LEVOTHROID) 175 MCG tablet, TAKE 1 TABLET BY MOUTH EVERY MORNING, Disp: 30 tablet, Rfl: 0  EXAM:  Vitals:   05/15/18 1007  BP: 118/80  Pulse: 75  Temp: 98.2 F (36.8 C)    Body mass index is 37.37 kg/m.  GENERAL: vitals reviewed and listed above, alert, oriented, appears well hydrated and in no acute distress  HEENT: atraumatic, conjunttiva clear, no obvious abnormalities on inspection of external nose and ears, Cerumen impaction R, small amount of soft wax L  NECK: no obvious masses on inspection  LUNGS: clear to auscultation bilaterally, no wheezes, rales or rhonchi, good air movement  SKIN: dry skin  CV: HRRR, no peripheral edema  MS: moves all extremities without noticeable abnormality  PSYCH: pleasant and cooperative, no obvious depression or anxiety  ASSESSMENT AND PLAN:  Discussed the following assessment and plan:  Impacted cerumen of right ear -discussed options/risks -significant amount of wax removed with soft curette - however, some wax remained packed on TM. She wanted to try lavage instead as she has sensitive ears. She however, was also uncomfortable with lavage, so she opted to try home ear drops. Agreed to return for recheck if an further issues and many require ENT referral.  Dry skin -advised gentle soap and good emollient -advised follow up if any symptoms remain   Fatigue, unspecified type -reports symptoms resolved -reviewed recent labs and also advised prompt eval if any symptoms recur  Elevated platelet count - Plan: CBC with Differential/Platelet -advised recheck  -Patient advised to return or notify a doctor immediately if symptoms worsen or persist or new concerns arise.  Patient Instructions  BEFORE YOU LEAVE: -lab -follow up: 3 months  Debrox ear drops for 1 week. Come back in for appointment if any pain or discomfort.  Aquaphor daily for skin. Gentle soap such as dove or  cerave.  Follow up promptly if you are feeling tired or depressed again.        [name], DO

## 2018-05-18 NOTE — Addendum Note (Signed)
Addended by: Agnes Lawrence on: 05/18/2018 01:45 PM   Modules accepted: Orders

## 2018-06-26 ENCOUNTER — Other Ambulatory Visit: Payer: Self-pay | Admitting: Family Medicine

## 2018-07-05 ENCOUNTER — Ambulatory Visit (INDEPENDENT_AMBULATORY_CARE_PROVIDER_SITE_OTHER): Payer: Managed Care, Other (non HMO) | Admitting: Family Medicine

## 2018-07-05 ENCOUNTER — Encounter: Payer: Self-pay | Admitting: Family Medicine

## 2018-07-05 VITALS — BP 118/68 | HR 75 | Temp 98.3°F | Ht 62.0 in | Wt 200.0 lb

## 2018-07-05 DIAGNOSIS — R030 Elevated blood-pressure reading, without diagnosis of hypertension: Secondary | ICD-10-CM

## 2018-07-05 DIAGNOSIS — N6320 Unspecified lump in the left breast, unspecified quadrant: Secondary | ICD-10-CM

## 2018-07-05 DIAGNOSIS — Z6836 Body mass index (BMI) 36.0-36.9, adult: Secondary | ICD-10-CM

## 2018-07-05 NOTE — Patient Instructions (Signed)
Follow up in 3 months if not already scheduled.  -We placed a referral for you as discussed to the breast center. It usually takes several days to process and schedule this referral. If you have not heard from [place] regarding this appointment in 1 week please contact our office.  I hope you are feeling better soon! Seek care promptly if your symptoms worsen, new concerns arise or you are not improving with treatment.

## 2018-07-05 NOTE — Progress Notes (Signed)
HPI:  Using dictation device. Unfortunately this device frequently misinterprets words/phrases.  Acute visit for L breast pain: -last several days -past due for mammo -no revers, malaise, nipple discharge, skin changes  Intentional weight loss: -has made big changes to her diet -eating much less sugar, cut out sweets and potatoe chips -exercising -lost 8 lbs the last 1-2 months  Elevated blood pressure: -hx hypertension -reports always up at doctor visit, increased anxiety with visits -no cp, sob  ROS: See pertinent positives and negatives per HPI.  Past Medical History:  Diagnosis Date  . ANEMIA-NOS 04/23/2008  . 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 Catalina Gravel  . Anginal pain ([place])    went to ED in april 2018 c/o chest pain over last 2 months ; had EKG, CXR  and troponin negative per physician suspected musculoskeletal  ; dc'd with ibuprofen  and recc f/u with outpatient stress test; see care everywhere ED visit  ; patient denies recurrence of Chest pain since, endorses occ palpitations   . Coughing    only coughing up clear sputum; to see her PCP today at 2:45 to address sx   . DEPRESSION 04/23/2008  . DJD (degenerative joint disease)   . Hyperplastic colon polyp    x2  . Hypertension   . HYPOTHYROIDISM 10/17/2007  . Osteoarthritis   . Palpitations    freq at night;  at pre-op states she drinks caffeine and that makes it worse; says the palpitations started after they took her thyroid   . PAT 10/27/2009   Qualifier: Diagnosis of  By: [name], MD, [name]    . PONV (postoperative nausea and vomiting)    only after 1979 surgery  . RA (rheumatoid arthritis) ([place])    "problems in feet, hands, and knees"  . 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 SVT (supraventricular tachycardia) ([facility])    chronic   . Tubulovillous adenoma of colon     Past Surgical History:  Procedure Laterality Date  . ABDOMINAL HYSTERECTOMY    . APPENDECTOMY    . BUNIONECTOMY   10/2009 right & 02/03/10 left  . COLONOSCOPY W/ POLYPECTOMY  2015  . HAMMER TOE SURGERY     "all toes have pins, done with bunionectomy"  . INGUINAL HERNIA REPAIR    . KNEE CLOSED REDUCTION Left 12/05/2017   Procedure: CLOSED MANIPULATION LEFT KNEE;  Surgeon: [name], MD;  [facility];  Service: Orthopedics;  Laterality: Left;  . neck fusion     x3  . THYROIDECTOMY  2001   for nodules  . TOE AMPUTATION  1979   6th toe removed from each foot  . TOTAL KNEE ARTHROPLASTY Left 10/21/2014   Procedure: LEFT TOTAL KNEE ARTHROPLASTY;  Surgeon: [name], MD;  [facility];  Service: Orthopedics;  Laterality: Left;  . TOTAL KNEE ARTHROPLASTY Right 12/05/2017   Procedure: RIGHT TOTAL KNEE ARTHROPLASTY;  Surgeon: [name], MD;  [facility];  Service: Orthopedics;  Laterality: Right;  70 mins    Family History  Problem Relation Age of Onset  . Colon cancer Mother   . Dementia Father   . Heart attack Maternal Grandmother   . Stroke Maternal Grandmother   . Diabetes Maternal Grandmother   . Heart disease Maternal Grandmother   . Thyroid disease Maternal Grandmother   . Esophageal cancer Neg Hx   . Rectal cancer Neg Hx   . Stomach cancer Neg Hx     SOCIAL  HX: see hpi   Current Outpatient Medications:  .  albuterol (PROVENTIL HFA;VENTOLIN HFA) 108 (90 Base) MCG/ACT inhaler, Inhale 2 puffs into the lungs every 6 (six) hours as needed. (Patient taking differently: Inhale 2 puffs into the lungs every 6 (six) hours as needed for wheezing or shortness of breath. ), Disp: 1 Inhaler, Rfl: 0 .  benazepril-hydrochlorthiazide (LOTENSIN HCT) 10-12.5 MG tablet, TAKE 1 TABLET BY MOUTH DAILY, Disp: 90 tablet, Rfl: 0 .  levothyroxine (SYNTHROID, LEVOTHROID) 175 MCG tablet, TAKE 1 TABLET BY MOUTH EVERY MORNING, Disp: 30 tablet, Rfl: 0  EXAM:  Vitals:   07/05/18 1703  BP: 118/68  Pulse: 75  Temp: 98.3 F (36.8 C)    Body mass index is 36.58 kg/m.  GENERAL: vitals reviewed  and listed above, alert, oriented, appears well hydrated and in no acute distress  HEENT: atraumatic, conjunttiva clear, no obvious abnormalities on inspection of external nose and ears  NECK: no obvious masses on inspection  LUNGS: clear to auscultation bilaterally, no wheezes, rales or rhonchi, good air movement  CV: HRRR, no peripheral edema  BREAST: TTP and increased density to breast tissue that is mobile and not well define just lateral to the areola of the L breast around 2-3 O'clock, no nipple discharge, skin abnormalities, or other sig findings  MS: moves all extremities without noticeable abnormality  PSYCH: pleasant and cooperative, no obvious depression or anxiety  ASSESSMENT AND PLAN:  Discussed the following assessment and plan:  Left breast mass - Plan: US BREAST LTD UNI LEFT INC AXILLA, MM Digital Screening, MM DIAG BREAST TOMO UNI LEFT -referral to breast center for acute issue (diag L breast/US)and routine mammo she is pas due for  Elevated blood pressure reading -good on recheck -cont curent tx  Class 2 severe obesity due to excess calories with serious comorbidity and body mass index (BMI) of 36.0 to 36.9 in adult [facility]) -congratulated on changes and encouraged to continue  Follow up 3 months, sooner as needed  -Patient advised to return or notify a doctor immediately if symptoms worsen or persist or new concerns arise.  Patient Instructions  Follow up in 3 months if not already scheduled.  -We placed a referral for you as discussed to the breast center. It usually takes several days to process and schedule this referral. If you have not heard from [place] regarding this appointment in 1 week please contact our office.  I hope you are feeling better soon! Seek care promptly if your symptoms worsen, new concerns arise or you are not improving with treatment.      [name], DO

## 2018-07-09 ENCOUNTER — Other Ambulatory Visit: Payer: Self-pay | Admitting: Family Medicine

## 2018-07-09 ENCOUNTER — Ambulatory Visit
Admission: RE | Admit: 2018-07-09 | Discharge: 2018-07-09 | Disposition: A | Discharge status: Home / Self Care | Payer: Managed Care, Other (non HMO) | Source: Ambulatory Visit | Attending: Family Medicine | Admitting: Family Medicine

## 2018-07-09 DIAGNOSIS — N6320 Unspecified lump in the left breast, unspecified quadrant: Secondary | ICD-10-CM

## 2018-07-09 DIAGNOSIS — R599 Enlarged lymph nodes, unspecified: Secondary | ICD-10-CM

## 2018-07-11 NOTE — Progress Notes (Signed)
HPI:  Using dictation device. Unfortunately this device frequently misinterprets words/phrases.  Acute visit to discuss recent breast center evaluation. Presented here for breast tenderness 10/17. With abnormal breast exam, we referred her for evaluation with [place] and diagnostic mammogram at the breast center. Per report a suspicious mass was found in the L breast and she is scheduled for a biopsy tomorrow. She is of course on an emotional roller coaster right now processing this. She reports a strong faith and a strong support group and is grateful for this. She has lots of questions about the process of getting the biopsy and lumpectomy, the cancer center here. She also is agreeable to CBT to help with the stress of this over the next few weeks. She had some fatigue a while back that then resolved and she wonders if this was related. Feels better now.   ROS: See pertinent positives and negatives per HPI.  Past Medical History:  Diagnosis Date  . ANEMIA-NOS 04/23/2008  . 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 Catalina Gravel  . Anginal pain ([place])    went to ED in april 2018 c/o chest pain over last 2 months ; had EKG, CXR  and troponin negative per physician suspected musculoskeletal  ; dc'd with ibuprofen  and recc f/u with outpatient stress test; see care everywhere ED visit  ; patient denies recurrence of Chest pain since, endorses occ palpitations   . Coughing    only coughing up clear sputum; to see her PCP today at 2:45 to address sx   . DEPRESSION 04/23/2008  . DJD (degenerative joint disease)   . Hyperplastic colon polyp    x2  . Hypertension   . HYPOTHYROIDISM 10/17/2007  . Osteoarthritis   . Palpitations    freq at night;  at pre-op states she drinks caffeine and that makes it worse; says the palpitations started after they took her thyroid   . PAT 10/27/2009   Qualifier: Diagnosis of  By: [name], MD, [name]    . PONV (postoperative nausea and vomiting)    only after 1979  surgery  . RA (rheumatoid arthritis) ([place])    "problems in feet, hands, and knees"  . 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 SVT (supraventricular tachycardia) ([facility])    chronic   . Tubulovillous adenoma of colon     Past Surgical History:  Procedure Laterality Date  . ABDOMINAL HYSTERECTOMY    . APPENDECTOMY    . BUNIONECTOMY  10/2009 right & 02/03/10 left  . COLONOSCOPY W/ POLYPECTOMY  2015  . HAMMER TOE SURGERY     "all toes have pins, done with bunionectomy"  . INGUINAL HERNIA REPAIR    . KNEE CLOSED REDUCTION Left 12/05/2017   Procedure: CLOSED MANIPULATION LEFT KNEE;  Surgeon: [name], MD;  [facility];  Service: Orthopedics;  Laterality: Left;  . neck fusion     x3  . THYROIDECTOMY  2001   for nodules  . TOE AMPUTATION  1979   6th toe removed from each foot  . TOTAL KNEE ARTHROPLASTY Left 10/21/2014   Procedure: LEFT TOTAL KNEE ARTHROPLASTY;  Surgeon: [name], MD;  [facility];  Service: Orthopedics;  Laterality: Left;  . TOTAL KNEE ARTHROPLASTY Right 12/05/2017   Procedure: RIGHT TOTAL KNEE ARTHROPLASTY;  Surgeon: [name], MD;  [facility];  Service: Orthopedics;  Laterality: Right;  70 mins    Family History  Problem Relation Age of Onset  .  Colon cancer Mother   . Breast cancer Mother 29  . Dementia Father   . Heart attack Maternal Grandmother   . Stroke Maternal Grandmother   . Diabetes Maternal Grandmother   . Heart disease Maternal Grandmother   . Thyroid disease Maternal Grandmother   . Esophageal cancer Neg Hx   . Rectal cancer Neg Hx   . Stomach cancer Neg Hx     SOCIAL HX: see hpi   Current Outpatient Medications:  .  albuterol (PROVENTIL HFA;VENTOLIN HFA) 108 (90 Base) MCG/ACT inhaler, Inhale 2 puffs into the lungs every 6 (six) hours as needed. (Patient taking differently: Inhale 2 puffs into the lungs every 6 (six) hours as needed for wheezing or shortness of breath. ), Disp: 1 Inhaler, Rfl: 0 .   benazepril-hydrochlorthiazide (LOTENSIN HCT) 10-12.5 MG tablet, TAKE 1 TABLET BY MOUTH DAILY, Disp: 90 tablet, Rfl: 0 .  levothyroxine (SYNTHROID, LEVOTHROID) 175 MCG tablet, TAKE 1 TABLET BY MOUTH EVERY MORNING, Disp: 30 tablet, Rfl: 0  EXAM:  Vitals:   07/12/18 0724  BP: 120/72  Pulse: 96  Temp: 98.3 F (36.8 C)    Body mass index is 35.96 kg/m.  GENERAL: vitals reviewed and listed above, alert, oriented, appears well hydrated and in no acute distress  HEENT: atraumatic, conjunttiva clear, no obvious abnormalities on inspection of external nose and ears  NECK: no obvious masses on inspection  MS: moves all extremities without noticeable abnormality  PSYCH: pleasant and cooperative, no obvious depression or anxiety  ASSESSMENT AND PLAN:  Discussed the following assessment and plan: More than 50% of over 25 minutes spent in total in caring for this patient was spent face-to-face with the patient, counseling and/or coordinating care.    Abnormal mammogram  Stress and adjustment reaction  Anxiety  -discussed the typical process from what I have observed with other patients and friends in this situation -do feel [facility] done a fantastic job with my other patients going through this and she was happy to hear this -discussed importance of a healthy diet, exercise, getting help for the stress during this process - she wants to do some CBT ASAP and will try to get her in with our counselor this week, number provided for her to call to schedule and will reach out myself today to see if he can work her in asap -follow up here anytime as needed   There are no Patient Instructions on file for this visit.  [name], DO

## 2018-07-12 ENCOUNTER — Encounter: Payer: Self-pay | Admitting: Family Medicine

## 2018-07-12 ENCOUNTER — Ambulatory Visit (INDEPENDENT_AMBULATORY_CARE_PROVIDER_SITE_OTHER): Payer: Managed Care, Other (non HMO) | Admitting: Family Medicine

## 2018-07-12 VITALS — BP 120/72 | HR 96 | Temp 98.3°F | Ht 62.0 in | Wt 196.6 lb

## 2018-07-12 DIAGNOSIS — F419 Anxiety disorder, unspecified: Secondary | ICD-10-CM

## 2018-07-12 DIAGNOSIS — F4329 Adjustment disorder with other symptoms: Secondary | ICD-10-CM

## 2018-07-12 DIAGNOSIS — R928 Other abnormal and inconclusive findings on diagnostic imaging of breast: Secondary | ICD-10-CM

## 2018-07-13 ENCOUNTER — Ambulatory Visit
Admission: RE | Admit: 2018-07-13 | Discharge: 2018-07-13 | Disposition: A | Discharge status: Home / Self Care | Payer: Managed Care, Other (non HMO) | Source: Ambulatory Visit | Attending: Family Medicine | Admitting: Family Medicine

## 2018-07-13 ENCOUNTER — Other Ambulatory Visit: Payer: Self-pay | Admitting: Family Medicine

## 2018-07-13 DIAGNOSIS — N6320 Unspecified lump in the left breast, unspecified quadrant: Secondary | ICD-10-CM

## 2018-07-13 DIAGNOSIS — R599 Enlarged lymph nodes, unspecified: Secondary | ICD-10-CM

## 2018-07-15 ENCOUNTER — Other Ambulatory Visit: Payer: Self-pay | Admitting: Family Medicine

## 2018-07-18 ENCOUNTER — Telehealth: Payer: Self-pay | Admitting: Hematology and Oncology

## 2018-07-18 NOTE — Telephone Encounter (Signed)
Spoke with patient to confirm morning [facility] appointment for 11/6, packet mailed to patient

## 2018-07-19 ENCOUNTER — Encounter: Payer: Self-pay | Admitting: *Deleted

## 2018-07-19 DIAGNOSIS — Z170 Estrogen receptor positive status [ER+]: Principal | ICD-10-CM

## 2018-07-19 DIAGNOSIS — C50412 Malignant neoplasm of upper-outer quadrant of left female breast: Secondary | ICD-10-CM

## 2018-07-25 ENCOUNTER — Other Ambulatory Visit: Payer: Self-pay

## 2018-07-25 ENCOUNTER — Inpatient Hospital Stay (HOSPITAL_BASED_OUTPATIENT_CLINIC_OR_DEPARTMENT_OTHER): Payer: Managed Care, Other (non HMO) | Admitting: Hematology and Oncology

## 2018-07-25 ENCOUNTER — Ambulatory Visit
Admission: RE | Admit: 2018-07-25 | Discharge: 2018-07-25 | Disposition: A | Discharge status: Home / Self Care | Payer: Managed Care, Other (non HMO) | Source: Ambulatory Visit | Attending: Radiation Oncology | Admitting: Radiation Oncology

## 2018-07-25 ENCOUNTER — Encounter: Payer: Self-pay | Admitting: Physical Therapy

## 2018-07-25 ENCOUNTER — Inpatient Hospital Stay: Payer: Managed Care, Other (non HMO)

## 2018-07-25 ENCOUNTER — Encounter: Payer: Self-pay | Admitting: *Deleted

## 2018-07-25 ENCOUNTER — Encounter: Payer: Self-pay | Admitting: Hematology and Oncology

## 2018-07-25 ENCOUNTER — Ambulatory Visit: Payer: Managed Care, Other (non HMO) | Attending: Surgery | Admitting: Physical Therapy

## 2018-07-25 ENCOUNTER — Ambulatory Visit: Payer: Self-pay | Admitting: Surgery

## 2018-07-25 DIAGNOSIS — R293 Abnormal posture: Secondary | ICD-10-CM | POA: Diagnosis present

## 2018-07-25 DIAGNOSIS — C50412 Malignant neoplasm of upper-outer quadrant of left female breast: Secondary | ICD-10-CM

## 2018-07-25 DIAGNOSIS — Z170 Estrogen receptor positive status [ER+]: Principal | ICD-10-CM

## 2018-07-25 DIAGNOSIS — Z809 Family history of malignant neoplasm, unspecified: Secondary | ICD-10-CM

## 2018-07-25 DIAGNOSIS — C50312 Malignant neoplasm of lower-inner quadrant of left female breast: Secondary | ICD-10-CM

## 2018-07-25 LAB — CMP (CANCER CENTER ONLY)
ALBUMIN: 3.8 g/dL (ref 3.5–5.0)
ALT: 11 U/L (ref 0–44)
ANION GAP: 9 (ref 5–15)
AST: 12 U/L — AB (ref 15–41)
Alkaline Phosphatase: 53 U/L (ref 38–126)
BILIRUBIN TOTAL: 0.4 mg/dL (ref 0.3–1.2)
BUN: 12 mg/dL (ref 6–20)
CALCIUM: 9.4 mg/dL (ref 8.9–10.3)
CO2: 28 mmol/L (ref 22–32)
Chloride: 104 mmol/L (ref 98–111)
Creatinine: 0.76 mg/dL (ref 0.44–1.00)
GFR, Est AFR Am: >60 mL/min (ref >60)
GFR, Estimated: >60 mL/min (ref >60)
Glucose, Bld: 99 mg/dL (ref 70–99)
Potassium: 3.8 mmol/L (ref 3.5–5.1)
Sodium: 141 mmol/L (ref 135–145)
TOTAL PROTEIN: 7.3 g/dL (ref 6.5–8.1)

## 2018-07-25 LAB — CBC WITH DIFFERENTIAL (CANCER CENTER ONLY)
Abs Immature Granulocytes: 0.02 10*3/uL (ref 0.00–0.07)
BASOS PCT: 1 %
Basophils Absolute: 0.1 10*3/uL (ref 0.0–0.1)
EOS ABS: 0.1 10*3/uL (ref 0.0–0.5)
EOS PCT: 2 %
HEMATOCRIT: 38.7 % (ref 36.0–46.0)
Hemoglobin: 12 g/dL (ref 12.0–15.0)
Immature Granulocytes: 0 %
LYMPHS ABS: 1.9 10*3/uL (ref 0.7–4.0)
Lymphocytes Relative: 28 %
MCH: 26.3 pg (ref 26.0–34.0)
MCHC: 31 g/dL (ref 30.0–36.0)
MCV: 84.9 fL (ref 80.0–100.0)
Monocytes Absolute: 0.4 10*3/uL (ref 0.1–1.0)
Monocytes Relative: 6 %
NRBC: 0 % (ref 0.0–0.2)
Neutro Abs: 4.5 10*3/uL (ref 1.7–7.7)
Neutrophils Relative %: 63 %
Platelet Count: 461 10*3/uL — ABNORMAL HIGH (ref 150–400)
RBC: 4.56 MIL/uL (ref 3.87–5.11)
RDW: 13.2 % (ref 11.5–15.5)
WBC Count: 7 10*3/uL (ref 4.0–10.5)

## 2018-07-25 NOTE — H&P (Signed)
Paula Jacobs Documented: 07/25/2018 8:06 AM Location: [facility] Patient #: [id number] DOB: 03/25/1961 Undefined / Language: English / Race: Black or African American Female  History of Present Illness (Keimya Briddell A. Terea Neubauer MD; 07/25/2018 11:15 AM) Patient words: Pt sent at the request of Dr moody for mammographic abnormality of her left breast AND LEFT MEDIAL BREAST MASS FOR 2 WEEKS DETECTED BY PT. The area of distortion measured 2 cm and bx showed IDC/DCIS and had a LN bx that was positive as well. no pain or discharge prior to biopsy.          CLINICAL DATA: 57-year-old female presenting for evaluation of a palpable lump in the lateral breast identified about 1 and half weeks ago.The patient states that within the last few months she has had some concerning unexplained weight loss. She does have family history of breast cancer in her mother who was diagnosed at the age of 45.  EXAM: DIGITAL DIAGNOSTIC BILATERAL MAMMOGRAM WITH CAD AND TOMO  LEFT BREAST ULTRASOUND  COMPARISON: Previous exam(s).  ACR Breast Density Category c: The breast tissue is heterogeneously dense, which may obscure small masses.  FINDINGS: A BB has been placed at the palpable site of concern in the lateral aspect of the left breast. Deep to the palpable marker there is an irregular spiculated mass measuring approximately 2.4 cm. There is an adjacent oval circumscribed mass measuring 1.1 cm, however given the circumscribed appearance this may represent a separate process. No other suspicious calcifications, masses or areas of distortion are seen in the bilateral breasts.  Mammographic images were processed with CAD.  There is an easily palpable mass in the left breast at the approximate 2 o'clock position.  Ultrasound of the left breast at 2 o'clock, 4 cm from the nipple demonstrates an irregular hypoechoic taller than wide mass measuring 2.0 x 1.5 x 1.8 cm. Adjacent to this mass,  there is a lymph node measuring 1.3 cm. The cortex of the lymph node measures 3 mm, however there is a small nodular projection off of one end. Ultrasound of the left axilla demonstrates one abnormal appearing lymph node whose cortex measures up to 1.0 cm. Numerous other benign-appearing lymph nodes with thin cortices are identified.  IMPRESSION: 1. There is a highly suspicious mass in the left breast at 2 o'clock measuring up to 2.0 cm.  2. There is an internal mammary lymph node at 2:30 with a suspicious small cortical nodular projection.  3. There is 1 abnormally thickened left axillary lymph node.  4. No evidence of right breast malignancy.  RECOMMENDATION: Ultrasound guided biopsy is recommended for the left breast mass at 2 o'clock, the intramammary lymph node, including the nodular cortical projection in the left breast at 2:30 and for the thickened left axillary lymph node. This procedure has been scheduled for 07/13/2018 at 1:45 p.m.  I have discussed the findings and recommendations with the patient. Results were also provided in writing at the conclusion of the visit. If applicable, a reminder letter will be sent to the patient regarding the next appointment.  BI-RADS CATEGORY 5: Highly suggestive of malignancy.   Electronically Signed By: Michelle Collins M.D. On: 07/09/2018 13:57            ADDITIONAL INFORMATION: 1. PROGNOSTIC INDICATORS Results: IMMUNOHISTOCHEMICAL AND MORPHOMETRIC ANALYSIS PERFORMED MANUALLY The tumor cells are negative for Her2 (1+). Estrogen Receptor: 95%, POSITIVE, STRONG STAINING INTENSITY Progesterone Receptor: 90%, POSITIVE, STRONG STAINING INTENSITY Proliferation Marker Ki67: 30% REFERENCE RANGE ESTROGEN RECEPTOR NEGATIVE 0% POSITIVE =>  1% REFERENCE RANGE PROGESTERONE RECEPTOR NEGATIVE 0% POSITIVE =>1% All controls stained appropriately JULIA MANNY MD Pathologist, Electronic Signature ( Signed 07/17/2018) 1.  and 3. Immunohistochemical stain for E-cadherin is positive in the tumor cells, consistent with a ductal phenotype Nilesh Kashikar MD Pathologist, Electronic Signature ( Signed 07/17/2018) FINAL DIAGNOSIS 1 of 3 FINAL for Paula Jacobs, Paula Jacobs ([id number]) Diagnosis 1. Breast, left, needle core biopsy, 2 o'clock - INVASIVE MAMMARY CARCINOMA, GRADE II-III. SEE NOTE. - DUCTAL CARCINOMA IN SITU, INTERMEDIATE NUCLEAR GRADE. 2. 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 - FOCUS OF METASTATIC CARCINOMA MEASURES 0.4 CM - SEE NOTE. 3. Lymph node, needle/core biopsy, left axilla - BENIGN LYMPH NODE. Diagnosis Note 1. -3. Dr. Butler has reviewed this case and concurs with the above interpretation. Immunostain for e-cadherin to characterize the tumor phenotype is pending and will be reported in an addendum (parts 1 and 2). A breast prognostic profile is pending and will re reported in an addendum (part 1). [facility] was notified on 10./28/2019. (NK:kh 07/16/18) Nilesh Kashikar MD Pathologist, Electronic Signature (Case signed 07/16/2018) Specimen Gross and Clinical Information Specimen Comment 1. Time in formalin: 2:20; extracted less than 1 minute; left mass 2. Time in formalin: 2:30; extracted less than 1 minute; left intramammary node 3. Time in formalin: 2:40 PM; extracted less than 1 minute; left axillary node Specimen(s) Obtained: 1. Breast, left, needle core biopsy, 2 o'clock 2. Breast, left, needle core biopsy, 2:30 o'clock 3. Lymph node, needle/core biopsy, left axilla.  The patient is a 57 year old female.   Medication History (Sylvia Ledford, RN; 07/25/2018 8:06 AM) Medications Reconciled     Review of Systems (Renatha Rosen A. Sanjit Mcmichael MD; 07/25/2018 11:11 AM) All other systems negative   Physical Exam (Salbador Fiveash A. Quaron Delacruz MD; 07/25/2018 11:16 AM)  General Mental Status-Alert. General  Appearance-Consistent with stated age. Hydration-Well hydrated. Voice-Normal.  Head and Neck Head-normocephalic, atraumatic with no lesions or palpable masses. Trachea-midline. Thyroid Gland Characteristics - normal size and consistency.  Chest and Lung Exam Chest and lung exam reveals -quiet, even and easy respiratory effort with no use of accessory muscles and on auscultation, normal breath sounds, no adventitious sounds and normal vocal resonance. Inspection Chest Wall - Normal. Back - normal.  Breast Note: LEFT BREAST BRUISING AND 2 CM MOBILE MEDIAL MASS RIGHT BREAST NORMAL  Cardiovascular Cardiovascular examination reveals -normal heart sounds, regular rate and rhythm with no murmurs and normal pedal pulses bilaterally.  Neurologic Neurologic evaluation reveals -alert and oriented x 3 with no impairment of recent or remote memory. Mental Status-Normal.  Musculoskeletal Normal Exam - Left-Upper Extremity Strength Normal and Lower Extremity Strength Normal. Normal Exam - Right-Upper Extremity Strength Normal and Lower Extremity Strength Normal.  Lymphatic Head & Neck  General Head & Neck Lymphatics: Bilateral - Description - Normal. Axillary  General Axillary Region: Bilateral - Description - Normal. Tenderness - Non Tender.    Assessment & Plan (Diante Barley A. Shaquil Aldana MD; 07/25/2018 11:18 AM)  BREAST CANCER, LEFT (C50.912) Impression: Pt desires bilateral mastectomy SLN mapiing and targeted LN biopsy. She had no interest in breast conservation at this point Discussed increase complication risk  She desires reconstruction as well Refer to plastics  Discussed treatment options for breast cancer to include breast conservation vs mastectomy with reconstruction. Pt has decided on mastectomy. Risk include bleeding, infection, flap necrosis, pain, numbness, recurrence, hematoma, other surgery needs. Pt understands and agrees to proceed. Risk of sentinel    lymph node mapping include bleeding, infection, lymphedema, shoulder pain. stiffness, dye allergy. cosmetic deformity , blood clots, death, need for more surgery. Pt agres to proceed.  Current Plans You are being scheduled for surgery- Our schedulers will call you.  You should hear from our office's scheduling department within 5 working days about the location, date, and time of surgery. We try to make accommodations for patient's preferences in scheduling surgery, but sometimes the OR schedule or the surgeon's schedule prevents us from making those accommodations.  If you have not heard from our office ([phone]) in 5 working days, call the office and ask for your surgeon's nurse.  If you have other questions about your diagnosis, plan, or surgery, call the office and ask for your surgeon's nurse.  We discussed the staging and pathophysiology of breast cancer. We discussed all of the different options for treatment for breast cancer including surgery, chemotherapy, radiation therapy, Herceptin, and antiestrogen therapy. We discussed a sentinel lymph node biopsy as she does not appear to having lymph node involvement right now. We discussed the performance of that with injection of radioactive tracer and blue dye. We discussed that she would have an incision underneath her axillary hairline. We discussed that there is a bout a 10-20% chance of having a positive node with a sentinel lymph node biopsy and we will await the permanent pathology to make any other first further decisions in terms of her treatment. One of these options might be to return to the operating room to perform an axillary lymph node dissection. We discussed about a 1-2% risk lifetime of chronic shoulder pain as well as lymphedema associated with a sentinel lymph node biopsy. We discussed the options for treatment of the breast cancer which included lumpectomy versus a mastectomy. We discussed the performance of the  lumpectomy with a wire placement. We discussed a 10-20% chance of a positive margin requiring reexcision in the operating room. We also discussed that she may need radiation therapy or antiestrogen therapy or both if she undergoes lumpectomy. We discussed the mastectomy and the postoperative care for that as well. We discussed that there is no difference in her survival whether she undergoes lumpectomy with radiation therapy or antiestrogen therapy versus a mastectomy. There is a slight difference in the local recurrence rate being 3-5% with lumpectomy and about 1% with a mastectomy. We discussed the risks of operation including bleeding, infection, possible reoperation. She understands her further therapy will be based on what her stages at the time of her operation.  Pt Education - C[facility] Mastectomy [facility]CI Pt Education - [facility] breast cancer surgery: discussed with patient and provided information. 

## 2018-07-25 NOTE — H&P (View-Only) (Signed)
Paula Jacobs Documented: 07/25/2018 8:06 AM Location: [facility] Patient #: [id number] DOB: 03/07/1961 Undefined / Language: English / Race: Black or African American Female  History of Present Illness (Thomas A. Cornett MD; 07/25/2018 11:15 AM) Patient words: Pt sent at the request of Dr moody for mammographic abnormality of her left breast AND LEFT MEDIAL BREAST MASS FOR 2 WEEKS DETECTED BY PT. The area of distortion measured 2 cm and bx showed IDC/DCIS and had a LN bx that was positive as well. no pain or discharge prior to biopsy.          CLINICAL DATA: 57-year-old female presenting for evaluation of a palpable lump in the lateral breast identified about 1 and half weeks ago.The patient states that within the last few months she has had some concerning unexplained weight loss. She does have family history of breast cancer in her mother who was diagnosed at the age of 45.  EXAM: DIGITAL DIAGNOSTIC BILATERAL MAMMOGRAM WITH CAD AND TOMO  LEFT BREAST ULTRASOUND  COMPARISON: Previous exam(s).  ACR Breast Density Category c: The breast tissue is heterogeneously dense, which may obscure small masses.  FINDINGS: A BB has been placed at the palpable site of concern in the lateral aspect of the left breast. Deep to the palpable marker there is an irregular spiculated mass measuring approximately 2.4 cm. There is an adjacent oval circumscribed mass measuring 1.1 cm, however given the circumscribed appearance this may represent a separate process. No other suspicious calcifications, masses or areas of distortion are seen in the bilateral breasts.  Mammographic images were processed with CAD.  There is an easily palpable mass in the left breast at the approximate 2 o'clock position.  Ultrasound of the left breast at 2 o'clock, 4 cm from the nipple demonstrates an irregular hypoechoic taller than wide mass measuring 2.0 x 1.5 x 1.8 cm. Adjacent to this mass,  there is a lymph node measuring 1.3 cm. The cortex of the lymph node measures 3 mm, however there is a small nodular projection off of one end. Ultrasound of the left axilla demonstrates one abnormal appearing lymph node whose cortex measures up to 1.0 cm. Numerous other benign-appearing lymph nodes with thin cortices are identified.  IMPRESSION: 1. There is a highly suspicious mass in the left breast at 2 o'clock measuring up to 2.0 cm.  2. There is an internal mammary lymph node at 2:30 with a suspicious small cortical nodular projection.  3. There is 1 abnormally thickened left axillary lymph node.  4. No evidence of right breast malignancy.  RECOMMENDATION: Ultrasound guided biopsy is recommended for the left breast mass at 2 o'clock, the intramammary lymph node, including the nodular cortical projection in the left breast at 2:30 and for the thickened left axillary lymph node. This procedure has been scheduled for 07/13/2018 at 1:45 p.m.  I have discussed the findings and recommendations with the patient. Results were also provided in writing at the conclusion of the visit. If applicable, a reminder letter will be sent to the patient regarding the next appointment.  BI-RADS CATEGORY 5: Highly suggestive of malignancy.   Electronically Signed By: Michelle Collins M.D. On: 07/09/2018 13:57            ADDITIONAL INFORMATION: 1. PROGNOSTIC INDICATORS Results: IMMUNOHISTOCHEMICAL AND MORPHOMETRIC ANALYSIS PERFORMED MANUALLY The tumor cells are negative for Her2 (1+). Estrogen Receptor: 95%, POSITIVE, STRONG STAINING INTENSITY Progesterone Receptor: 90%, POSITIVE, STRONG STAINING INTENSITY Proliferation Marker Ki67: 30% REFERENCE RANGE ESTROGEN RECEPTOR NEGATIVE 0% POSITIVE =>  1% REFERENCE RANGE PROGESTERONE RECEPTOR NEGATIVE 0% POSITIVE =>1% All controls stained appropriately JULIA MANNY MD Pathologist, Electronic Signature ( Signed 07/17/2018) 1.  and 3. Immunohistochemical stain for E-cadherin is positive in the tumor cells, consistent with a ductal phenotype Nilesh Kashikar MD Pathologist, Electronic Signature ( Signed 07/17/2018) FINAL DIAGNOSIS 1 of 3 FINAL for Paula Jacobs, Paula Jacobs ([id number]) Diagnosis 1. Breast, left, needle core biopsy, 2 o'clock - INVASIVE MAMMARY CARCINOMA, GRADE II-III. SEE NOTE. - DUCTAL CARCINOMA IN SITU, INTERMEDIATE NUCLEAR GRADE. 2. 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 - FOCUS OF METASTATIC CARCINOMA MEASURES 0.4 CM - SEE NOTE. 3. Lymph node, needle/core biopsy, left axilla - BENIGN LYMPH NODE. Diagnosis Note 1. -3. Dr. Butler has reviewed this case and concurs with the above interpretation. Immunostain for e-cadherin to characterize the tumor phenotype is pending and will be reported in an addendum (parts 1 and 2). A breast prognostic profile is pending and will re reported in an addendum (part 1). [facility] was notified on 10./28/2019. (NK:kh 07/16/18) Nilesh Kashikar MD Pathologist, Electronic Signature (Case signed 07/16/2018) Specimen Gross and Clinical Information Specimen Comment 1. Time in formalin: 2:20; extracted less than 1 minute; left mass 2. Time in formalin: 2:30; extracted less than 1 minute; left intramammary node 3. Time in formalin: 2:40 PM; extracted less than 1 minute; left axillary node Specimen(s) Obtained: 1. Breast, left, needle core biopsy, 2 o'clock 2. Breast, left, needle core biopsy, 2:30 o'clock 3. Lymph node, needle/core biopsy, left axilla.  The patient is a 57 year old female.   Medication History (Sylvia Ledford, RN; 07/25/2018 8:06 AM) Medications Reconciled     Review of Systems (Thomas A. Cornett MD; 07/25/2018 11:11 AM) All other systems negative   Physical Exam (Thomas A. Cornett MD; 07/25/2018 11:16 AM)  General Mental Status-Alert. General  Appearance-Consistent with stated age. Hydration-Well hydrated. Voice-Normal.  Head and Neck Head-normocephalic, atraumatic with no lesions or palpable masses. Trachea-midline. Thyroid Gland Characteristics - normal size and consistency.  Chest and Lung Exam Chest and lung exam reveals -quiet, even and easy respiratory effort with no use of accessory muscles and on auscultation, normal breath sounds, no adventitious sounds and normal vocal resonance. Inspection Chest Wall - Normal. Back - normal.  Breast Note: LEFT BREAST BRUISING AND 2 CM MOBILE MEDIAL MASS RIGHT BREAST NORMAL  Cardiovascular Cardiovascular examination reveals -normal heart sounds, regular rate and rhythm with no murmurs and normal pedal pulses bilaterally.  Neurologic Neurologic evaluation reveals -alert and oriented x 3 with no impairment of recent or remote memory. Mental Status-Normal.  Musculoskeletal Normal Exam - Left-Upper Extremity Strength Normal and Lower Extremity Strength Normal. Normal Exam - Right-Upper Extremity Strength Normal and Lower Extremity Strength Normal.  Lymphatic Head & Neck  General Head & Neck Lymphatics: Bilateral - Description - Normal. Axillary  General Axillary Region: Bilateral - Description - Normal. Tenderness - Non Tender.    Assessment & Plan (Thomas A. Cornett MD; 07/25/2018 11:18 AM)  BREAST CANCER, LEFT (C50.912) Impression: Pt desires bilateral mastectomy SLN mapiing and targeted LN biopsy. She had no interest in breast conservation at this point Discussed increase complication risk  She desires reconstruction as well Refer to plastics  Discussed treatment options for breast cancer to include breast conservation vs mastectomy with reconstruction. Pt has decided on mastectomy. Risk include bleeding, infection, flap necrosis, pain, numbness, recurrence, hematoma, other surgery needs. Pt understands and agrees to proceed. Risk of sentinel    lymph node mapping include bleeding, infection, lymphedema, shoulder pain. stiffness, dye allergy. cosmetic deformity , blood clots, death, need for more surgery. Pt agres to proceed.  Current Plans You are being scheduled for surgery- Our schedulers will call you.  You should hear from our office's scheduling department within 5 working days about the location, date, and time of surgery. We try to make accommodations for patient's preferences in scheduling surgery, but sometimes the OR schedule or the surgeon's schedule prevents us from making those accommodations.  If you have not heard from our office ([phone]) in 5 working days, call the office and ask for your surgeon's nurse.  If you have other questions about your diagnosis, plan, or surgery, call the office and ask for your surgeon's nurse.  We discussed the staging and pathophysiology of breast cancer. We discussed all of the different options for treatment for breast cancer including surgery, chemotherapy, radiation therapy, Herceptin, and antiestrogen therapy. We discussed a sentinel lymph node biopsy as she does not appear to having lymph node involvement right now. We discussed the performance of that with injection of radioactive tracer and blue dye. We discussed that she would have an incision underneath her axillary hairline. We discussed that there is a bout a 10-20% chance of having a positive node with a sentinel lymph node biopsy and we will await the permanent pathology to make any other first further decisions in terms of her treatment. One of these options might be to return to the operating room to perform an axillary lymph node dissection. We discussed about a 1-2% risk lifetime of chronic shoulder pain as well as lymphedema associated with a sentinel lymph node biopsy. We discussed the options for treatment of the breast cancer which included lumpectomy versus a mastectomy. We discussed the performance of the  lumpectomy with a wire placement. We discussed a 10-20% chance of a positive margin requiring reexcision in the operating room. We also discussed that she may need radiation therapy or antiestrogen therapy or both if she undergoes lumpectomy. We discussed the mastectomy and the postoperative care for that as well. We discussed that there is no difference in her survival whether she undergoes lumpectomy with radiation therapy or antiestrogen therapy versus a mastectomy. There is a slight difference in the local recurrence rate being 3-5% with lumpectomy and about 1% with a mastectomy. We discussed the risks of operation including bleeding, infection, possible reoperation. She understands her further therapy will be based on what her stages at the time of her operation.  Pt Education - [facility] Mastectomy [facility]CI Pt Education - [facility] breast cancer surgery: discussed with patient and provided information. 

## 2018-07-25 NOTE — Progress Notes (Signed)
Radiation Oncology         [phone] ________________________________  Name: Paula Jacobs        MRN: [id number]  Date of Service: 07/25/2018 DOB: Mar 09, 1961  CC:Kim, Nickola Major, DO  Erroll Luna, MD     REFERRING PHYSICIAN: Erroll Luna, MD   DIAGNOSIS: The encounter diagnosis was Malignant neoplasm of upper-outer quadrant of left breast in female, estrogen receptor positive ([place]).   HISTORY OF PRESENT ILLNESS: Paula Jacobs is a 57 y.o. female seen in the multidisciplinary breast clinic for a new diagnosis of left breast cancer. The patient was noted to have a palpable abnormality in the left breast. Diagnostic imaging revealed a 2 x 1.8 x 1.5 cm mass in the 2:00 position in the upper outer quadrant. There was an intramammary node at 2:30 and one abnormal axillary node on ultrasound. She underwent a biopsy on 07/13/18. 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 Her intramammary node at 2:30 was also consistent with disease and her axillary node was also sampled and was benign but felt to be discordant. She comes today to clinic to discuss treatment recommendations for her cancer.    PREVIOUS RADIATION THERAPY: Yes  2001: The patient was treated with radioactive iodine for thyroid nodules postoperatively.  PAST MEDICAL HISTORY:  Past Medical History:  Diagnosis Date  . ANEMIA-NOS 04/23/2008  . 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 Catalina Gravel  . Anginal pain ([place])    went to ED in april 2018 c/o chest pain over last 2 months ; had EKG, CXR  and troponin negative per physician suspected musculoskeletal  ; dc'd with ibuprofen  and recc f/u with outpatient stress test; see care everywhere ED visit  ; patient denies recurrence of Chest pain since, endorses occ palpitations   . Coughing    only coughing up clear sputum; to see her PCP today at 2:45 to address  sx   . DEPRESSION 04/23/2008  . DJD (degenerative joint disease)   . Hyperplastic colon polyp    x2  . Hypertension   . HYPOTHYROIDISM 10/17/2007  . Osteoarthritis   . Palpitations    freq at night;  at pre-op states she drinks caffeine and that makes it worse; says the palpitations started after they took her thyroid   . PAT 10/27/2009   Qualifier: Diagnosis of  By: [name], MD, [name]    . PONV (postoperative nausea and vomiting)    only after 1979 surgery  . RA (rheumatoid arthritis) ([place])    "problems in feet, hands, and knees"  . 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 SVT (supraventricular tachycardia) ([facility])    chronic   . Tubulovillous adenoma of colon        PAST SURGICAL HISTORY: Past Surgical History:  Procedure Laterality Date  . ABDOMINAL HYSTERECTOMY    . APPENDECTOMY    . BUNIONECTOMY  10/2009 right & 02/03/10 left  . COLONOSCOPY W/ POLYPECTOMY  2015  . HAMMER TOE SURGERY     "all toes have pins, done with bunionectomy"  . INGUINAL HERNIA REPAIR    . KNEE CLOSED REDUCTION Left 12/05/2017   Procedure: CLOSED MANIPULATION LEFT KNEE;  Surgeon: [name], MD;  Location: [facility];  Service: Orthopedics;  Laterality: Left;  . neck fusion     x3  . THYROIDECTOMY  2001  for nodules  . TOE AMPUTATION  1979   6th toe removed from each foot  . TOTAL KNEE ARTHROPLASTY Left 10/21/2014   Procedure: LEFT TOTAL KNEE ARTHROPLASTY;  Surgeon: [name], MD;  Location: [facility];  Service: Orthopedics;  Laterality: Left;  . TOTAL KNEE ARTHROPLASTY Right 12/05/2017   Procedure: RIGHT TOTAL KNEE ARTHROPLASTY;  Surgeon: [name], MD;  Location: [facility];  Service: Orthopedics;  Laterality: Right;  70 mins     FAMILY HISTORY:  Family History  Problem Relation Age of Onset  . Colon cancer Mother   . Breast cancer Mother 52  . Dementia Father   . Heart attack Maternal Grandmother   . Stroke Maternal Grandmother   . Diabetes Maternal Grandmother   . Heart disease  Maternal Grandmother   . Thyroid disease Maternal Grandmother   . Esophageal cancer Neg Hx   . Rectal cancer Neg Hx   . Stomach cancer Neg Hx      SOCIAL HISTORY:  reports that she quit smoking about 6 years ago. She has a 16.50 pack-year smoking history. She has never used smokeless tobacco. She reports that she drinks alcohol. She reports that she does not use drugs. The patient is married and lives in [place]. 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    ALLERGIES: Hydrocodone-acetaminophen; Latex; and Tramadol hcl   MEDICATIONS:  Current Outpatient Medications  Medication Sig Dispense Refill  . albuterol (PROVENTIL HFA;VENTOLIN HFA) 108 (90 Base) MCG/ACT inhaler Inhale 2 puffs into the lungs every 6 (six) hours as needed. (Patient taking differently: Inhale 2 puffs into the lungs every 6 (six) hours as needed for wheezing or shortness of breath. ) 1 Inhaler 0  . benazepril-hydrochlorthiazide (LOTENSIN HCT) 10-12.5 MG tablet TAKE 1 TABLET BY MOUTH DAILY 90 tablet 0  . levothyroxine (SYNTHROID, LEVOTHROID) 175 MCG tablet TAKE 1 TABLET BY MOUTH EVERY MORNING 30 tablet 5   No current facility-administered medications for this visit.      REVIEW OF SYSTEMS: On review of systems, the patient reports that she is doing well overall. She denies any chest pain, shortness of breath, cough, fevers, chills, night sweats, unintended weight changes. She denies any bowel or bladder disturbances, and denies abdominal pain, nausea or vomiting. She denies any new musculoskeletal or joint aches or pains. A complete review of systems is obtained and is otherwise negative.     PHYSICAL EXAM:  Wt Readings from Last 3 Encounters:  07/12/18 196 lb 9.6 oz (89.2 kg)  07/05/18 200 lb (90.7 kg)  05/15/18 204 lb 4.8 oz (92.7 kg)   Temp Readings from Last 3 Encounters:  07/12/18 98.3 F (36.8 C) (Oral)  07/05/18 98.3 F (36.8 C) (Oral)  05/15/18 98.2 F (36.8 C) (Oral)   BP Readings from Last 3 Encounters:    07/12/18 120/72  07/05/18 118/68  05/15/18 118/80   Pulse Readings from Last 3 Encounters:  07/12/18 96  07/05/18 75  05/15/18 75     In general this is a well appearing African American female in no acute distress. She is alert and oriented x4 and appropriate throughout the examination. HEENT reveals that the patient is normocephalic, atraumatic. EOMs are intact. Skin is intact without any evidence of gross lesions. Cardiovascular exam reveals a regular rate and rhythm, no clicks rubs or murmurs are auscultated. Chest is clear to auscultation bilaterally. Lymphatic assessment is performed and does not reveal any adenopathy in the cervical, supraclavicular, axillary, or inguinal chains. Bilateral breast exam is performed  and reveals fullness of about 2 cm in the lateral left breast and no additional masses or adenopathy can be felt. No mass on the right is noted. Skin is intact and no nipple bleeding or discharge is noted of either breast. Abdomen has active bowel sounds in all quadrants and is intact. The abdomen is soft, non tender, non distended. Lower extremities are negative for pretibial pitting edema, deep calf tenderness, cyanosis or clubbing.   ECOG = 1  0 - Asymptomatic (Fully active, able to carry on all predisease activities without restriction)  1 - Symptomatic but completely ambulatory (Restricted in physically strenuous activity but ambulatory and able to carry out work of a light or sedentary nature. For example, light housework, office work)  2 - Symptomatic, <50% in bed during the day (Ambulatory and capable of all self care but unable to carry out any work activities. Up and about more than 50% of waking hours)  3 - Symptomatic, >50% in bed, but not bedbound (Capable of only limited self-care, confined to bed or chair 50% or more of waking hours)  4 - Bedbound (Completely disabled. Cannot carry on any self-care. Totally confined to bed or chair)  5 - Death   Eustace Pen MM,  Creech RH, Tormey DC, et al. [phone]). "Toxicity and response criteria of the [facility]". [place] Oncol. 5 (6): 649-55    LABORATORY DATA:  Lab Results  Component Value Date   WBC 6.3 05/15/2018   HGB 11.1 (L) 05/15/2018   HCT 34.0 (L) 05/15/2018   MCV 82.7 05/15/2018   PLT 430.0 (H) 05/15/2018   Lab Results  Component Value Date   NA 142 03/20/2018   K 3.6 03/20/2018   CL 105 03/20/2018   CO2 27 03/20/2018   Lab Results  Component Value Date   ALT 16 11/27/2013   AST 13 11/27/2013   ALKPHOS 50 11/27/2013   BILITOT 0.3 11/27/2013      RADIOGRAPHY: US Breast Ltd Uni Left Inc Axilla  Result Date: 07/09/2018 CLINICAL DATA:  57 year old female presenting for evaluation of a palpable lump in the lateral breast identified about 1 and half weeks ago.The patient states that within the last few months she has had some concerning unexplained weight loss. She does have family history of breast cancer in her mother who was diagnosed at the age of 60. EXAM: DIGITAL DIAGNOSTIC BILATERAL MAMMOGRAM WITH CAD AND TOMO LEFT BREAST ULTRASOUND COMPARISON:  Previous exam(s). ACR Breast Density Category c: The breast tissue is heterogeneously dense, which may obscure small masses. FINDINGS: A BB has been placed at the palpable site of concern in the lateral aspect of the left breast. Deep to the palpable marker there is an irregular spiculated mass measuring approximately 2.4 cm. There is an adjacent oval circumscribed mass measuring 1.1 cm, however given the circumscribed appearance this may represent a separate process. No other suspicious calcifications, masses or areas of distortion are seen in the bilateral breasts. Mammographic images were processed with CAD. There is an easily palpable mass in the left breast at the approximate 2 o'clock position. Ultrasound of the left breast at 2 o'clock, 4 cm from the nipple demonstrates an irregular hypoechoic taller than wide mass  measuring 2.0 x 1.5 x 1.8 cm. Adjacent to this mass, there is a lymph node measuring 1.3 cm. The cortex of the lymph node measures 3 mm, however there is a small nodular projection off of one end. Ultrasound of the left axilla demonstrates  one abnormal appearing lymph node whose cortex measures up to 1.0 cm. Numerous other benign-appearing lymph nodes with thin cortices are identified. IMPRESSION: 1. There is a highly suspicious mass in the left breast at 2 o'clock measuring up to 2.0 cm. 2. There is an internal mammary lymph node at 2:30 with a suspicious small cortical nodular projection. 3.  There is 1 abnormally thickened left axillary lymph node. 4.  No evidence of right breast malignancy. RECOMMENDATION: Ultrasound guided biopsy is recommended for the left breast mass at 2 o'clock, the intramammary lymph node, including the nodular cortical projection in the left breast at 2:30 and for the thickened left axillary lymph node. This procedure has been scheduled for 07/13/2018 at 1:45 p.m. I have discussed the findings and recommendations with the patient. Results were also provided in writing at the conclusion of the visit. If applicable, a reminder letter will be sent to the patient regarding the next appointment. BI-RADS CATEGORY  5: Highly suggestive of malignancy. Electronically Signed   By: Ammie Ferrier M.D.   On: 07/09/2018 13:57   Mm Diag Breast Tomo Bilateral  Result Date: 07/09/2018 CLINICAL DATA:  57 year old female presenting for evaluation of a palpable lump in the lateral breast identified about 1 and half weeks ago.The patient states that within the last few months she has had some concerning unexplained weight loss. She does have family history of breast cancer in her mother who was diagnosed at the age of 51. EXAM: DIGITAL DIAGNOSTIC BILATERAL MAMMOGRAM WITH CAD AND TOMO LEFT BREAST ULTRASOUND COMPARISON:  Previous exam(s). ACR Breast Density Category c: The breast tissue is  heterogeneously dense, which may obscure small masses. FINDINGS: A BB has been placed at the palpable site of concern in the lateral aspect of the left breast. Deep to the palpable marker there is an irregular spiculated mass measuring approximately 2.4 cm. There is an adjacent oval circumscribed mass measuring 1.1 cm, however given the circumscribed appearance this may represent a separate process. No other suspicious calcifications, masses or areas of distortion are seen in the bilateral breasts. Mammographic images were processed with CAD. There is an easily palpable mass in the left breast at the approximate 2 o'clock position. Ultrasound of the left breast at 2 o'clock, 4 cm from the nipple demonstrates an irregular hypoechoic taller than wide mass measuring 2.0 x 1.5 x 1.8 cm. Adjacent to this mass, there is a lymph node measuring 1.3 cm. The cortex of the lymph node measures 3 mm, however there is a small nodular projection off of one end. Ultrasound of the left axilla demonstrates one abnormal appearing lymph node whose cortex measures up to 1.0 cm. Numerous other benign-appearing lymph nodes with thin cortices are identified. IMPRESSION: 1. There is a highly suspicious mass in the left breast at 2 o'clock measuring up to 2.0 cm. 2. There is an internal mammary lymph node at 2:30 with a suspicious small cortical nodular projection. 3.  There is 1 abnormally thickened left axillary lymph node. 4.  No evidence of right breast malignancy. RECOMMENDATION: Ultrasound guided biopsy is recommended for the left breast mass at 2 o'clock, the intramammary lymph node, including the nodular cortical projection in the left breast at 2:30 and for the thickened left axillary lymph node. This procedure has been scheduled for 07/13/2018 at 1:45 p.m. I have discussed the findings and recommendations with the patient. Results were also provided in writing at the conclusion of the visit. If applicable, a reminder letter will be  sent to the patient regarding the next appointment. BI-RADS CATEGORY  5: Highly suggestive of malignancy. Electronically Signed   By: Ammie Ferrier M.D.   On: 07/09/2018 13:57   [place] Axillary Node Core Biopsy Left  Addendum Date: 07/18/2018   ADDENDUM REPORT: 07/17/2018 12:49 ADDENDUM: Pathology revealed GRADE II-III INVASIVE MAMMARY CARCINOMA, INTERMEDIATE NUCLEAR GRADE DUCTAL CARCINOMA IN SITU of the Left breast, 2 o'clock. METASTATIC MAMMARY CARCINOMA of the Left breast, 2:30 o'clock. Immunohistochemical stain for E-cadherin is positive in the tumor cells, consistent with a ductal phenotype. This was found to be concordant by Dr. Dorise Bullion. Pathology revealed BENIGN LYMPH NODE of the Left axilla. This was found to be discordant by Dr. Dorise Bullion, with a targeted sentinel lymph node excision recommended. Pathology results were discussed with the patient by telephone. The patient reported doing well after the biopsies with tenderness at the sites. Post biopsy instructions and care were reviewed and questions were answered. The patient was encouraged to call [facility] for any additional concerns. Pathology results reported by Terie Purser, RN on 07/17/2018. Electronically Signed   By: Dorise Bullion III M.D   On: 07/17/2018 12:49   Addendum Date: 07/16/2018   ADDENDUM REPORT: 07/16/2018 10:34 ADDENDUM: CLINICAL DATA: Biopsy of a left breast mass at 2 o'clock, a left intramammary node at 2:30, and a left axillary node EXAM: ULTRASOUND GUIDED LEFT BREAST CORE NEEDLE BIOPSY COMPARISON: Previous exam(s). FINDINGS: I met with the patient and we discussed the procedure of ultrasound-guided biopsy, including benefits and alternatives. We discussed the high likelihood of a successful procedure. We discussed the risks of the procedure, including infection, bleeding, tissue injury, clip migration, and inadequate sampling. Informed written consent was given. The usual time-out  protocol was performed immediately prior to the procedure. Lesion quadrant: Upper-outer Using sterile technique and 1% Lidocaine as local anesthetic, under direct ultrasound visualization, a 12 gauge spring-loaded device was used to perform biopsy of the 2 o'clock left breast mass using a lateral approach. At the conclusion of the procedure a tissue marker clip was deployed into the biopsy cavity. Follow up 2 view mammogram was performed and dictated separately. Lesion quadrant: Upper-outer Using sterile technique and 1% Lidocaine as local anesthetic, under direct ultrasound visualization, a 12 gauge spring-loaded device was used to perform biopsy of the 230 left intramammary lymph node using a lateral approach. At the conclusion of the procedure a tissue marker clip was deployed into the biopsy cavity. Follow up 2 view mammogram was performed and dictated separately. Lesion quadrant: Left axilla Using sterile technique and 1% Lidocaine as local anesthetic, under direct ultrasound visualization, a 14 gauge spring-loaded device was used to perform biopsy of the left axillary lymph node using a lateral approach. At the conclusion of the procedure a HydroMARK tissue marker clip was deployed into the biopsy cavity. Follow up 2 view mammogram was performed and dictated separately. IMPRESSION: Ultrasound guided biopsy of a left breast mass, a left intramammary node, and an abnormal left axillary node. No apparent complications. Electronically Signed   By: Dorise Bullion III M.D   On: 07/16/2018 10:34   Addendum Date: 07/14/2018   ADDENDUM REPORT: 07/14/2018 07:07 Electronically Signed   By: Dorise Bullion III M.D   On: 07/14/2018 07:07   Result Date: 07/18/2018 : Please insert correct template. Please see the dictation for the ultrasound breast biopsy, first lesion. Electronically Signed: By: Dorise Bullion III M.D On: 07/13/2018 15:02   Mm Clip Placement Left  Result Date: 07/13/2018 CLINICAL DATA:   Evaluate biopsy markers EXAM: DIAGNOSTIC LEFT MAMMOGRAM POST ULTRASOUND BIOPSY COMPARISON:  Previous exam(s). FINDINGS: Mammographic images were obtained following ultrasound guided biopsy of a left breast mass, an intramammary lymph node, and a left axillary lymph node. The coil shaped clip is within the intramammary lymph node. The ribbon shaped clip is within the 2 o'clock left breast mass. The HydroMARK clip within the axillary node could not be visualized on today's mammogram. IMPRESSION: Clip placement as above. Final Assessment: Post Procedure Mammograms for Marker Placement Electronically Signed   By: Dorise Bullion III M.D   On: 07/13/2018 15:01   [place] Lt Breast Bx W Loc Dev 1st Lesion Img Bx Spec US Guide  Addendum Date: 07/18/2018   ADDENDUM REPORT: 07/17/2018 12:50 ADDENDUM: Pathology revealed GRADE II-III INVASIVE MAMMARY CARCINOMA, INTERMEDIATE NUCLEAR GRADE DUCTAL CARCINOMA IN SITU of the Left breast, 2 o'clock. METASTATIC MAMMARY CARCINOMA of the Left breast, 2:30 o'clock. Immunohistochemical stain for E-cadherin is positive in the tumor cells, consistent with a ductal phenotype. This was found to be concordant by Dr. Dorise Bullion. Pathology revealed BENIGN LYMPH NODE of the Left axilla. This was found to be discordant by Dr. Dorise Bullion, with a targeted sentinel lymph node excision recommended. Pathology results were discussed with the patient by telephone. The patient reported doing well after the biopsies with tenderness at the sites. Post biopsy instructions and care were reviewed and questions were answered. The patient was encouraged to call [facility] for any additional concerns. Pathology results reported by Terie Purser, RN on 07/17/2018. Electronically Signed   By: Dorise Bullion III M.D   On: 07/17/2018 12:50   Result Date: 07/18/2018 CLINICAL DATA:  Biopsy of a left breast mass at 2 o'clock, a left intramammary node at 2:30, and a left axillary  node EXAM: ULTRASOUND GUIDED LEFT BREAST CORE NEEDLE BIOPSY COMPARISON:  Previous exam(s). FINDINGS: I met with the patient and we discussed the procedure of ultrasound-guided biopsy, including benefits and alternatives. We discussed the high likelihood of a successful procedure. We discussed the risks of the procedure, including infection, bleeding, tissue injury, clip migration, and inadequate sampling. Informed written consent was given. The usual time-out protocol was performed immediately prior to the procedure. Lesion quadrant: Upper-outer Using sterile technique and 1% Lidocaine as local anesthetic, under direct ultrasound visualization, a 12 gauge spring-loaded device was used to perform biopsy of the 2 o'clock left breast mass using a lateral approach. At the conclusion of the procedure a tissue marker clip was deployed into the biopsy cavity. Follow up 2 view mammogram was performed and dictated separately. Lesion quadrant: Upper-outer Using sterile technique and 1% Lidocaine as local anesthetic, under direct ultrasound visualization, a 12 gauge spring-loaded device was used to perform biopsy of the 230 left intramammary lymph node using a lateral approach. At the conclusion of the procedure a tissue marker clip was deployed into the biopsy cavity. Follow up 2 view mammogram was performed and dictated separately. Lesion quadrant: Left axilla Using sterile technique and 1% Lidocaine as local anesthetic, under direct ultrasound visualization, a 14 gauge spring-loaded device was used to perform biopsy of the left axillary lymph node using a lateral approach. At the conclusion of the procedure a HydroMARK tissue marker clip was deployed into the biopsy cavity. Follow up 2 view mammogram was performed and dictated separately. IMPRESSION: Ultrasound guided biopsy of a left breast mass, a left intramammary node, and an abnormal left axillary node.  No apparent complications. Electronically Signed: By: Dorise Bullion III M.D On: 07/13/2018 14:47   [place] Lt Breast Bx W Loc Dev Ea Add Lesion Img Bx Spec US Guide  Addendum Date: 07/18/2018   ADDENDUM REPORT: 07/17/2018 12:49 ADDENDUM: Pathology revealed GRADE II-III INVASIVE MAMMARY CARCINOMA, INTERMEDIATE NUCLEAR GRADE DUCTAL CARCINOMA IN SITU of the Left breast, 2 o'clock. METASTATIC MAMMARY CARCINOMA of the Left breast, 2:30 o'clock. Immunohistochemical stain for E-cadherin is positive in the tumor cells, consistent with a ductal phenotype. This was found to be concordant by Dr. Dorise Bullion. Pathology revealed BENIGN LYMPH NODE of the Left axilla. This was found to be discordant by Dr. Dorise Bullion, with a targeted sentinel lymph node excision recommended. Pathology results were discussed with the patient by telephone. The patient reported doing well after the biopsies with tenderness at the sites. Post biopsy instructions and care were reviewed and questions were answered. The patient was encouraged to call [facility] for any additional concerns. Pathology results reported by Terie Purser, RN on 07/17/2018. Electronically Signed   By: Dorise Bullion III M.D   On: 07/17/2018 12:49   Addendum Date: 07/16/2018   ADDENDUM REPORT: 07/16/2018 10:34 ADDENDUM: CLINICAL DATA: Biopsy of a left breast mass at 2 o'clock, a left intramammary node at 2:30, and a left axillary node EXAM: ULTRASOUND GUIDED LEFT BREAST CORE NEEDLE BIOPSY COMPARISON: Previous exam(s). FINDINGS: I met with the patient and we discussed the procedure of ultrasound-guided biopsy, including benefits and alternatives. We discussed the high likelihood of a successful procedure. We discussed the risks of the procedure, including infection, bleeding, tissue injury, clip migration, and inadequate sampling. Informed written consent was given. The usual time-out protocol was performed immediately prior to the procedure. Lesion quadrant: Upper-outer Using sterile technique and  1% Lidocaine as local anesthetic, under direct ultrasound visualization, a 12 gauge spring-loaded device was used to perform biopsy of the 2 o'clock left breast mass using a lateral approach. At the conclusion of the procedure a tissue marker clip was deployed into the biopsy cavity. Follow up 2 view mammogram was performed and dictated separately. Lesion quadrant: Upper-outer Using sterile technique and 1% Lidocaine as local anesthetic, under direct ultrasound visualization, a 12 gauge spring-loaded device was used to perform biopsy of the 230 left intramammary lymph node using a lateral approach. At the conclusion of the procedure a tissue marker clip was deployed into the biopsy cavity. Follow up 2 view mammogram was performed and dictated separately. Lesion quadrant: Left axilla Using sterile technique and 1% Lidocaine as local anesthetic, under direct ultrasound visualization, a 14 gauge spring-loaded device was used to perform biopsy of the left axillary lymph node using a lateral approach. At the conclusion of the procedure a HydroMARK tissue marker clip was deployed into the biopsy cavity. Follow up 2 view mammogram was performed and dictated separately. IMPRESSION: Ultrasound guided biopsy of a left breast mass, a left intramammary node, and an abnormal left axillary node. No apparent complications. Electronically Signed   By: Dorise Bullion III M.D   On: 07/16/2018 10:34   Addendum Date: 07/14/2018   ADDENDUM REPORT: 07/14/2018 07:07 Electronically Signed   By: Dorise Bullion III M.D   On: 07/14/2018 07:07   Result Date: 07/18/2018 : Please insert correct template. Please see the dictation for the ultrasound breast biopsy, first lesion. Electronically Signed: By: Dorise Bullion III M.D On: 07/13/2018 15:02       IMPRESSION/PLAN: 1. 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 Dr. Lisbeth Renshaw discusses the pathology  findings and reviews the nature of invasive left breast disease. The consensus from the breast conference includes breast conservation with lumpectomy with sentinel node biopsy.Dr. Lindi Adie has recommended testing her tumor for mammaprint  to determine a role for systemic therapy. Provided that chemotherapy is not indicated, the patient's course would then be followed by external radiotherapy to the breast followed by antiestrogen therapy. We discussed the risks, benefits, short, and long term effects of radiotherapy, and the patient is interested in proceeding. Dr. Lisbeth Renshaw discusses the delivery and logistics of radiotherapy and anticipates a course of 6 1/2 weeks of radiotherapy with deep inspiration breath hold technique. Depending on her nodal involvement at the time of surgery, we may anticipate covering the regional nodes with radiotherapy. We will see her back about 2 weeks after surgery to discuss the simulation process and anticipate we starting radiotherapy about 4-6 weeks after surgery. At the end of the conversation however the patient is interested in bilateral mastectomies. We will follow up with her postoperative results and meet back based on this. 2. Possible genetic predisposition to malignancy. The patient is a candidate for genetic testing given her personal and family history. She was offered referral and is interested in stat referral. This will be coordinated asap.  The above documentation reflects my direct findings during this shared patient visit. Please see the separate note by Dr. Lisbeth Renshaw on this date for the remainder of the patient's plan of care.    Carola Rhine, PAC

## 2018-07-25 NOTE — Therapy (Signed)
[facility] [place], [place], [address] Phone: [phone]   Fax:  [phone]  Physical Therapy Evaluation  Patient Details  Name: SOPHINA MITTEN MRN: [id number] Date of Birth: October 25, 1960 Referring Provider (PT): Dr. Erroll Luna   Encounter Date: 07/25/2018  PT End of Session - 07/25/18 1131    Visit Number  1    Number of Visits  2    Date for PT Re-Evaluation  09/19/18    PT Start Time  0962    PT Stop Time  1100    PT Time Calculation (min)  25 min    Activity Tolerance  Patient tolerated treatment well    Behavior During Therapy  [facility] for tasks assessed/performed       Past Medical History:  Diagnosis Date  . ANEMIA-NOS 04/23/2008  . Aneurysm ([place]) 10/19/2017   Cerebral Followed by Dr. Catalina Gravel  . Anginal pain ([place])    went to ED in april 2018 c/o chest pain over last 2 months ; had EKG, CXR  and troponin negative per physician suspected musculoskeletal  ; dc'd with ibuprofen  and recc f/u with outpatient stress test; see care everywhere ED visit  ; patient denies recurrence of Chest pain since, endorses occ palpitations   . Coughing    only coughing up clear sputum; to see her PCP today at 2:45 to address sx   . DEPRESSION 04/23/2008  . DJD (degenerative joint disease)   . Hyperplastic colon polyp    x2  . Hypertension   . HYPOTHYROIDISM 10/17/2007  . Osteoarthritis   . Palpitations    freq at night;  at pre-op states she drinks caffeine and that makes it worse; says the palpitations started after they took her thyroid   . PAT 10/27/2009   Qualifier: Diagnosis of  By: [name], MD, [name]    . PONV (postoperative nausea and vomiting)    only after 1979 surgery  . RA (rheumatoid arthritis) ([place])    "problems in feet, hands, and knees"  . 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 SVT (supraventricular tachycardia) ([facility])    chronic   . Tubulovillous adenoma of colon     Past Surgical  History:  Procedure Laterality Date  . ABDOMINAL HYSTERECTOMY    . APPENDECTOMY    . BUNIONECTOMY  10/2009 right & 02/03/10 left  . COLONOSCOPY W/ POLYPECTOMY  2015  . HAMMER TOE SURGERY     "all toes have pins, done with bunionectomy"  . INGUINAL HERNIA REPAIR    . KNEE CLOSED REDUCTION Left 12/05/2017   Procedure: CLOSED MANIPULATION LEFT KNEE;  Surgeon: [name], MD;  Location: [facility];  Service: Orthopedics;  Laterality: Left;  . neck fusion     x3  . THYROIDECTOMY  2001   for nodules  . TOE AMPUTATION  1979   6th toe removed from each foot  . TOTAL KNEE ARTHROPLASTY Left 10/21/2014   Procedure: LEFT TOTAL KNEE ARTHROPLASTY;  Surgeon: [name], MD;  Location: [facility];  Service: Orthopedics;  Laterality: Left;  . TOTAL KNEE ARTHROPLASTY Right 12/05/2017   Procedure: RIGHT TOTAL KNEE ARTHROPLASTY;  Surgeon: [name], MD;  Location: [facility];  Service: Orthopedics;  Laterality: Right;  70 mins    There were no vitals filed for this visit.   Subjective Assessment - 07/25/18 1124    Subjective  Patient reports she is here today to be seen by her medical team  for her newly diagnosed left breast cancer.    Patient is accompained by:  Family member    Pertinent History  Patient was diagnosed on 07/09/18 with left grade II-III invasive ductal carcinoma breast cancer. It measures 2 cm and is located in the upper outer quadrant. It is ER/PR positive and HER2 negative with a Ki67 of 30%. She has a positive intramammary node measuring 1.3 cm. She has a history of bilateral knee replacements (right in 5/19 and left in 2016) and she has had 3 cervical fusions.    Patient Stated Goals  Learn post op HEP and lymphedema risk reduction    Currently in Pain?  No/denies         [facility] PT Assessment - 07/25/18 0001      Assessment   Medical Diagnosis  Left breast cancer    Referring Provider (PT)  Dr. Marcello Moores [place]    Onset Date/Surgical Date  07/09/18    Hand Dominance  Right    Prior  Therapy  none      Precautions   Precautions  Other (comment)    Precaution Comments  active cancer      Restrictions   Weight Bearing Restrictions  No      Balance Screen   Has the patient fallen in the past 6 months  No    Has the patient had a decrease in activity level because of a fear of falling?   No    Is the patient reluctant to leave their home because of a fear of falling?   No      Home Environment   Living Environment  Private residence    Living Arrangements  Spouse/significant other;Children   Husband and 37 y.o. daughter   Available Help at Discharge  Family      Prior Function   Level of Independence  Independent    Vocation  Full time employment    Vocation Requirements  Works at [employer] where is stays/lives while working    Leisure  She walks 30 min daily      Cognition   Overall Cognitive Status  Within Functional Limits for tasks assessed      Posture/Postural Control   Posture/Postural Control  Postural limitations    Postural Limitations  Rounded Shoulders;Forward head;Increased thoracic kyphosis   Forward head appears due to previous cervical fusions     ROM / Strength   AROM / PROM / Strength  AROM;Strength      AROM   AROM Assessment Site  Shoulder;Cervical    Right/Left Shoulder  Right;Left    Right Shoulder Extension  55 Degrees    Right Shoulder Flexion  149 Degrees    Right Shoulder ABduction  157 Degrees    Right Shoulder Internal Rotation  61 Degrees    Right Shoulder External Rotation  74 Degrees    Left Shoulder Extension  62 Degrees    Left Shoulder Flexion  148 Degrees    Left Shoulder ABduction  163 Degrees    Left Shoulder Internal Rotation  74 Degrees    Left Shoulder External Rotation  87 Degrees    Cervical Flexion  WNL    Cervical Extension  WNL    Cervical - Right Side Bend  25% limited    Cervical - Left Side Bend  WNL    Cervical - Right Rotation  25% limited    Cervical - Left Rotation  WNL      Strength  Overall Strength  Within functional limits for tasks performed        LYMPHEDEMA/ONCOLOGY QUESTIONNAIRE - 07/25/18 1130      Type   Cancer Type  Left breast      Lymphedema Assessments   Lymphedema Assessments  Upper extremities      Right Upper Extremity Lymphedema   10 cm Proximal to Olecranon Process  38.1 cm    Olecranon Process  24.6 cm    10 cm Proximal to Ulnar Styloid Process  22.8 cm    Just Proximal to Ulnar Styloid Process  15.5 cm    Across Hand at [employer]  18.2 cm    At [place] of 2nd Digit  6.2 cm      Left Upper Extremity Lymphedema   10 cm Proximal to Olecranon Process  39.1 cm    Olecranon Process  24.7 cm    10 cm Proximal to Ulnar Styloid Process  21.8 cm    Just Proximal to Ulnar Styloid Process  15.6 cm    Across Hand at [employer]  17.1 cm    At [place] of 2nd Digit  5.9 cm             Objective measurements completed on examination: See above findings.     Patient was instructed today in a home exercise program today for post op shoulder range of motion. These included active assist shoulder flexion in sitting, scapular retraction, wall walking with shoulder abduction, and hands behind head external rotation.  She was encouraged to do these twice a day, holding 3 seconds and repeating 5 times when permitted by her physician.      PT Education - 07/25/18 1131    Education Details  Lymphedema risk reduction and post op shoulder ROM HEP    Person(s) Educated  Patient;Parent(s)    Methods  Explanation;Demonstration;Handout    Comprehension  Returned demonstration;Verbalized understanding          PT Long Term Goals - 07/25/18 1225      PT LONG TERM GOAL #1   Title  Patient will demonstrate she has regained shoulder ROM and function post operatively compared to baseline measurements.    Time  8    Period  Weeks    Status  New      Breast Clinic Goals - 07/25/18 1225      Patient will be able to verbalize understanding of  pertinent lymphedema risk reduction practices relevant to her diagnosis specifically related to skin care.   Time  1    Period  Days    Status  Achieved      Patient will be able to return demonstrate and/or verbalize understanding of the post-op home exercise program related to regaining shoulder range of motion.   Time  1    Period  Days    Status  Achieved      Patient will be able to verbalize understanding of the importance of attending the postoperative After Breast Cancer Class for further lymphedema risk reduction education and therapeutic exercise.   Time  1    Period  Days    Status  Achieved            Plan - 07/25/18 1132    Clinical Impression Statement  Patient was diagnosed on 07/09/18 with left grade II-III invasive ductal carcinoma breast cancer. It measures 2 cm and is located in the upper outer quadrant. It is ER/PR positive and HER2 negative with  a Ki67 of 30%. She has a positive intramammary node measuring 1.3 cm. She has a history of bilateral knee replacements (right in 5/19 and left in 2016) and she has had 3 cervical fusions. Her multidisciplinary medical team met prior to her assessments to determine a recommended treatment plan. She is planning to have a bilateral mastectomy and left targeted axillary node dissection followed by mammaprint testing, radiation, and anti-estrogen therapy. She will benefit from post op PT to regain shoulder ROM and reduce lymphedema risk.    History and Personal Factors relevant to plan of care:  Hx bil TKR and 3 cervical fusions    Clinical Presentation  Stable    Clinical Decision Making  Low    Rehab Potential  Excellent    Clinical Impairments Affecting Rehab Potential  None    PT Frequency  --   Eval and 1 f/u visit   PT Treatment/Interventions  ADLs/Self Care Home Management;Therapeutic exercise;Patient/family education    PT Next Visit Plan  Will reassess 3-4 weeks post op to determine needs    PT Home Exercise Plan   Post op shoulder ROM HEP    Consulted and Agree with Plan of Care  Patient;Family member/caregiver    Family Member Consulted  Mom and grandfather       Patient will benefit from skilled therapeutic intervention in order to improve the following deficits and impairments:  Decreased knowledge of precautions, Postural dysfunction, Decreased range of motion, Impaired UE functional use, Pain  Visit Diagnosis: Malignant neoplasm of upper-outer quadrant of left breast in female, estrogen receptor positive ([place]) - Plan: PT plan of care cert/re-cert  Abnormal posture - Plan: PT plan of care cert/re-cert   Patient will follow up at outpatient cancer rehab 3-4 weeks following surgery.  If the patient requires physical therapy at that time, a specific plan will be dictated and sent to the referring physician for approval. The patient was educated today on appropriate basic range of motion exercises to begin post operatively and the importance of attending the After Breast Cancer class following surgery.  Patient was educated today on lymphedema risk reduction practices as it pertains to recommendations that will benefit the patient immediately following surgery.  She verbalized good understanding.      Problem List Patient Active Problem List   Diagnosis Date Noted  . Malignant neoplasm of upper-outer quadrant of left breast in female, estrogen receptor positive ([place]) 07/19/2018  . 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 Aneurysm ([place]) 10/19/2017  . Morbid obesity ([place]) 10/22/2014  . 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 Rheumatoid arthritis ([place]) 09/26/2013  . Hypertension 01/31/2011  . Hypothyroidism 10/17/2007   Annia Friendly, PT 07/25/18 12:39 PM   [facility] [place], [place], [address] Phone: [phone]   Fax:  [phone]  Name: IDALY VERRET MRN: [id number] Date of Birth: April 03, 1961

## 2018-07-25 NOTE — Assessment & Plan Note (Signed)
07/13/2018: Palpable left breast mass, 2 adjacent masses at 2 o'clock position 2 cm biopsy IDC grade 2-3, Ig DCIS, ER 95%, PR 90%, Ki-67 30%, HER-2 -1+; 2:30 position intramammary lymph node 1.3 cm biopsy positive; left axillary lymph node biopsy negative discordant, T2N1, stage IIa  Pathology and radiology counseling:Discussed with the patient, the details of pathology including the type of breast cancer,the clinical staging, the significance of ER, PR and HER-2/neu receptors and the implications for treatment. After reviewing the pathology in detail, we proceeded to discuss the different treatment options between surgery, radiation, chemotherapy, antiestrogen therapies.  Recommendations: 1. Breast conserving surgery followed by 2. Mammaprint testing to determine if chemotherapy would be of any benefit followed by 3. Adjuvant radiation therapy followed by 4. Adjuvant antiestrogen therapy  Mammaprint counseling: MINDACT is a prospective, randomized phase III controlled trial that investigates the clinical utility of MammaPrint, when compared to standard clinical pathological criteria, with 6,693 patients enrolled from over 111 institutions. Clinical high-risk patients with a Low Risk MammaPrint result, including 48% node-positive, had 5-year distant metastasis-free survival rate in excess of 94 percent, whether randomized to receive adjuvant chemotherapy or not proving MammaPrint's ability to safely identify Low Risk patients.   Return to clinic after surgery to discuss final pathology report and then determine if Mammaprint testing will need to be sent.

## 2018-07-25 NOTE — Patient Instructions (Signed)

## 2018-07-25 NOTE — Progress Notes (Signed)
Nutrition Assessment  Reason for Assessment:  Pt seen in Breast Clinic  ASSESSMENT:   57 year old female with new diagnosis of breast cancer.  Past medical history total thyroidectomy.    Patient reports decreased appetite due to anxiety. Reports that PCP is aware and is seeing a therapist to help with anxiety.    Medications:  reviewed  Labs: reviewed  Anthropometrics:   Height: 62 inches Weight: 194 lb 9.6 BMI: 35  Patient reports unintentional weight loss   NUTRITION DIAGNOSIS: Food and nutrition related knowledge deficit related to new diagnosis of breast cancer as evidenced by no prior need for nutrition related information.  INTERVENTION:  Encouraged close follow-up with PCP and therapist.   Discussed and provided packet of information regarding nutritional tips for breast cancer patients.  Questions answered.  Teachback method used.  Contact information provided and patient knows to contact me with questions/concerns.    MONITORING, EVALUATION, and GOAL: Pt will consume a healthy plant based diet to maintain lean body mass throughout treatment.   Katalin Colledge B. Zenia Resides, [place], [place] Registered Dietitian [phone] (pager)

## 2018-07-25 NOTE — Progress Notes (Signed)
Paula Jacobs NOTE  Patient Care Team: [name], DO as PCP - General (Family Medicine) Hurley Cisco, MD as Consulting Physician (Rheumatology) [name], MD as Consulting Physician (Orthopedic Surgery) Erroll Luna, MD as Consulting Physician (General Surgery) Nicholas Lose, MD as Consulting Physician (Hematology and Oncology) Kyung Rudd, MD as Consulting Physician (Radiation Oncology)  CHIEF COMPLAINTS/PURPOSE OF CONSULTATION:  Newly diagnosed breast cancer  HISTORY OF PRESENTING ILLNESS:  [name] 57 y.o. female is here because of recent diagnosis of left breast cancer.  Patient palpated a left breast mass which was evaluated by the mammogram and ultrasound.  She had 2 adjacent masses one was grade 2-3 invasive ductal carcinoma that was ER PR positive HER-2 negative.  The second lesion is an intramammary lymph node that is positive for breast cancer.  She had an enlarged axillary lymph node which on biopsy but was discordant but negative.  She was presented this morning to the multidisciplinary clinic and she is here today to discuss the treatment plan.  I reviewed her records extensively and collaborated the history with the patient.  SUMMARY OF ONCOLOGIC HISTORY:   Malignant neoplasm of upper-outer quadrant of left breast in female, estrogen receptor positive ([place])   07/13/2018 Initial Diagnosis     Palpable left breast mass, 2 adjacent masses at 2 o'clock position 2 cm biopsy IDC grade 2-3, Ig DCIS, ER 95%, PR 90%, Ki-67 30%, HER-2 -1+; 2:30 position intramammary lymph node 1.3 cm biopsy positive; left axillary lymph node biopsy negative discordant, T2N1, stage IIa    07/25/2018 Cancer Staging    Staging form: Breast, AJCC 8th Edition - Clinical: Stage IIA (cT1c, cN1, cM0, G3, ER+, PR+, HER2-) - Signed by Nicholas Lose, MD on 07/25/2018      MEDICAL HISTORY:  Past Medical History:  Diagnosis Date  . ANEMIA-NOS 04/23/2008  . 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 Catalina Gravel  . Anginal pain ([place])    went to ED in april 2018 c/o chest pain over last 2 months ; had EKG, CXR  and troponin negative per physician suspected musculoskeletal  ; dc'd with ibuprofen  and recc f/u with outpatient stress test; see care everywhere ED visit  ; patient denies recurrence of Chest pain since, endorses occ palpitations   . Coughing    only coughing up clear sputum; to see her PCP today at 2:45 to address sx   . DEPRESSION 04/23/2008  . DJD (degenerative joint disease)   . Hyperplastic colon polyp    x2  . Hypertension   . HYPOTHYROIDISM 10/17/2007  . Osteoarthritis   . Palpitations    freq at night;  at pre-op states she drinks caffeine and that makes it worse; says the palpitations started after they took her thyroid   . PAT 10/27/2009   Qualifier: Diagnosis of  By: [name], MD, [name]    . PONV (postoperative nausea and vomiting)    only after 1979 surgery  . RA (rheumatoid arthritis) ([place])    "problems in feet, hands, and knees"  . 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 SVT (supraventricular tachycardia) ([facility])    chronic   . Tubulovillous adenoma of colon     SURGICAL HISTORY: Past Surgical History:  Procedure Laterality Date  . ABDOMINAL HYSTERECTOMY    . APPENDECTOMY    . BUNIONECTOMY  10/2009 right & 02/03/10 left  . COLONOSCOPY W/ POLYPECTOMY  2015  . HAMMER TOE SURGERY     "  all toes have pins, done with bunionectomy"  . INGUINAL HERNIA REPAIR    . KNEE CLOSED REDUCTION Left 12/05/2017   Procedure: CLOSED MANIPULATION LEFT KNEE;  Surgeon: [name], MD;  Location: [facility];  Service: Orthopedics;  Laterality: Left;  . neck fusion     x3  . THYROIDECTOMY  2001   for nodules  . TOE AMPUTATION  1979   6th toe removed from each foot  . TOTAL KNEE ARTHROPLASTY Left 10/21/2014   Procedure: LEFT TOTAL KNEE ARTHROPLASTY;  Surgeon: [name], MD;  Location: [facility];  Service: Orthopedics;  Laterality:  Left;  . TOTAL KNEE ARTHROPLASTY Right 12/05/2017   Procedure: RIGHT TOTAL KNEE ARTHROPLASTY;  Surgeon: [name], MD;  Location: [facility];  Service: Orthopedics;  Laterality: Right;  70 mins    SOCIAL HISTORY: Social History   Socioeconomic History  . Marital status: Married    Spouse name: Not on file  . Number of children: 2  . Years of education: 33  . Highest education level: Not on file  Occupational History    Employer: [employer]  Social Needs  . Financial resource strain: Not on file  . Food insecurity:    Worry: Not on file    Inability: Not on file  . Transportation needs:    Medical: Not on file    Non-medical: Not on file  Tobacco Use  . Smoking status: Former Smoker    Packs/day: 0.50    Years: 33.00    Pack years: 16.50    Last attempt to quit: 09/15/2011    Years since quitting: 6.8  . Smokeless tobacco: Never Used  Substance and Sexual Activity  . Alcohol use: Yes    Comment: rare  . Drug use: No  . Sexual activity: Not on file  Lifestyle  . Physical activity:    Days per week: Not on file    Minutes per session: Not on file  . Stress: Not on file  Relationships  . Social connections:    Talks on phone: Not on file    Gets together: Not on file    Attends religious service: Not on file    Active member of club or organization: Not on file    Attends meetings of clubs or organizations: Not on file    Relationship status: Not on file  . Intimate partner violence:    Fear of current or ex partner: Not on file    Emotionally abused: Not on file    Physically abused: Not on file    Forced sexual activity: Not on file  Other Topics Concern  . 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 Married - 2006  1 son '92  1 dtr '94. Work - at [employer].    FAMILY HISTORY: Family History  Problem Relation Age of Onset  . Colon cancer Mother   . Breast cancer Mother 76  . Dementia Father   . Heart attack Maternal  Grandmother   . Stroke Maternal Grandmother   . Diabetes Maternal Grandmother   . Heart disease Maternal Grandmother   . Thyroid disease Maternal Grandmother   . Esophageal cancer Neg Hx   . Rectal cancer Neg Hx   . Stomach cancer Neg Hx     ALLERGIES:  is allergic to hydrocodone-acetaminophen; latex; and tramadol hcl.  MEDICATIONS:  Current Outpatient Medications  Medication Sig Dispense Refill  . benazepril-hydrochlorthiazide (LOTENSIN HCT) 10-12.5  MG tablet TAKE 1 TABLET BY MOUTH DAILY 90 tablet 0  . levothyroxine (SYNTHROID, LEVOTHROID) 175 MCG tablet TAKE 1 TABLET BY MOUTH EVERY MORNING 30 tablet 5   No current facility-administered medications for this visit.     REVIEW OF SYSTEMS:   Constitutional: Denies fevers, chills or abnormal night sweats Eyes: Denies blurriness of vision, double vision or watery eyes Ears, nose, mouth, throat, and face: Denies mucositis or sore throat Respiratory: Denies cough, dyspnea or wheezes Cardiovascular: Denies palpitation, chest discomfort or lower extremity swelling Gastrointestinal:  Denies nausea, heartburn or change in bowel habits Skin: Denies abnormal skin rashes Lymphatics: Denies new lymphadenopathy or easy bruising Neurological:Denies numbness, tingling or new weaknesses Behavioral/Psych: Mood is stable, no new changes  Breast: Palpable lump in the left breast All other systems were reviewed with the patient and are negative.  PHYSICAL EXAMINATION: ECOG PERFORMANCE STATUS: 1 - Symptomatic but completely ambulatory  Vitals:   07/25/18 0846  BP: 125/83  Pulse: 76  Resp: 18  Temp: 98.3 F (36.8 C)  SpO2: 99%   Filed Weights   07/25/18 0846  Weight: 194 lb 9.6 oz (88.3 kg)    GENERAL:alert, no distress and comfortable SKIN: skin color, texture, turgor are normal, no rashes or significant lesions EYES: normal, conjunctiva are pink and non-injected, sclera clear OROPHARYNX:no exudate, no erythema and lips, buccal  mucosa, and tongue normal  NECK: supple, thyroid normal size, non-tender, without nodularity LYMPH:  no palpable lymphadenopathy in the cervical, axillary or inguinal LUNGS: clear to auscultation and percussion with normal breathing effort HEART: regular rate & rhythm and no murmurs and no lower extremity edema ABDOMEN:abdomen soft, non-tender and normal bowel sounds Musculoskeletal:no cyanosis of digits and no clubbing  PSYCH: alert & oriented x 3 with fluent speech NEURO: no focal motor/sensory deficits BREAST: Left breast palpable lump. No palpable axillary or supraclavicular lymphadenopathy (exam performed in the presence of a chaperone)   LABORATORY DATA:  I have reviewed the data as listed Lab Results  Component Value Date   WBC 7.0 07/25/2018   HGB 12.0 07/25/2018   HCT 38.7 07/25/2018   MCV 84.9 07/25/2018   PLT 461 (H) 07/25/2018   Lab Results  Component Value Date   NA 141 07/25/2018   K 3.8 07/25/2018   CL 104 07/25/2018   CO2 28 07/25/2018    RADIOGRAPHIC STUDIES: I have personally reviewed the radiological reports and agreed with the findings in the report.  ASSESSMENT AND PLAN:  Malignant neoplasm of upper-outer quadrant of left breast in female, estrogen receptor positive ([place]) 07/13/2018: Palpable left breast mass, 2 adjacent masses at 2 o'clock position 2 cm biopsy IDC grade 2-3, Ig DCIS, ER 95%, PR 90%, Ki-67 30%, HER-2 -1+; 2:30 position intramammary lymph node 1.3 cm biopsy positive; left axillary lymph node biopsy negative discordant, T2N1, stage IIa  Pathology and radiology counseling:Discussed with the patient, the details of pathology including the type of breast cancer,the clinical staging, the significance of ER, PR and HER-2/neu receptors and the implications for treatment. After reviewing the pathology in detail, we proceeded to discuss the different treatment options between surgery, radiation, chemotherapy, antiestrogen  therapies.  Recommendations: 1. Breast conserving surgery followed by 2. Mammaprint testing to determine if chemotherapy would be of any benefit followed by 3. Adjuvant radiation therapy followed by 4. Adjuvant antiestrogen therapy  Mammaprint counseling: MINDACT is a prospective, randomized phase III controlled trial that investigates the clinical utility of MammaPrint, when compared to standard clinical pathological criteria, with  3,403 patients enrolled from over 111 institutions. Clinical high-risk patients with a Low Risk MammaPrint result, including 48% node-positive, had 5-year distant metastasis-free survival rate in excess of 94 percent, whether randomized to receive adjuvant chemotherapy or not proving MammaPrint's ability to safely identify Low Risk patients.   Return to clinic after surgery to discuss final pathology report and then determine if Mammaprint testing will need to be sent.     All questions were answered. The patient knows to call the clinic with any problems, questions or concerns.    Harriette Ohara, MD 07/25/18

## 2018-07-25 NOTE — Progress Notes (Signed)
Clinical Social Work [place] Psychosocial Distress Screening [place]  Patient completed distress screening protocol and scored a 5 on the Psychosocial Distress Thermometer which indicates moderate distress. Clinical Social Worker met with patient and patients family in [facility] to assess for distress and other psychosocial needs. Patient stated she was feeling overwhelmed but felt "better" after meeting with the treatment team and getting more information on her treatment plan. CSW and patient discussed common feeling and emotions when being diagnosed with cancer, and the importance of support during treatment. CSW informed patient of the support team and support services at [facility]. CSW provided contact information and encouraged patient to call with any questions or concerns.   ONCBCN DISTRESS SCREENING 07/25/2018  Screening Type Initial Screening  Distress experienced in past week (1-10) 5  Emotional problem type Nervousness/Anxiety;Adjusting to illness    Paula Jacobs, MSW, LCSW, OSW-C Clinical Social Worker [facility] [phone]

## 2018-07-30 ENCOUNTER — Encounter: Payer: Self-pay | Admitting: Genetics

## 2018-07-30 ENCOUNTER — Inpatient Hospital Stay (HOSPITAL_BASED_OUTPATIENT_CLINIC_OR_DEPARTMENT_OTHER): Payer: Managed Care, Other (non HMO) | Admitting: Genetics

## 2018-07-30 ENCOUNTER — Inpatient Hospital Stay: Payer: Managed Care, Other (non HMO)

## 2018-07-30 DIAGNOSIS — Z170 Estrogen receptor positive status [ER+]: Secondary | ICD-10-CM | POA: Diagnosis not present

## 2018-07-30 DIAGNOSIS — Z803 Family history of malignant neoplasm of breast: Secondary | ICD-10-CM | POA: Insufficient documentation

## 2018-07-30 DIAGNOSIS — C50412 Malignant neoplasm of upper-outer quadrant of left female breast: Secondary | ICD-10-CM | POA: Diagnosis not present

## 2018-07-30 NOTE — Progress Notes (Signed)
REFERRING PROVIDER: Nicholas Lose, MD [place], [place] [id number]  PRIMARY PROVIDER:  [name], DO  PRIMARY REASON FOR VISIT:  1. Malignant neoplasm of upper-outer quadrant of left breast in female, estrogen receptor positive ([place])   2. Family history of breast cancer     HISTORY OF PRESENT ILLNESS:   Paula Jacobs, a 57 y.o. female, was seen for a [place] cancer genetics consultation at the request of Dr. Lindi Adie due to a personal and family history of breast cancer.  Paula Jacobs presents to clinic today to discuss the possibility of a hereditary predisposition to cancer, genetic testing, and to further clarify her future cancer risks, as well as potential cancer risks for family members.   In 2019, at the age of 65, Paula Jacobs was diagnosed with Invasive mammary carcinoma with DICS of the left breast ER/PR+. She is currently planning to have a bilateral mastectomy, she states her genetics will not change this decision.  However, genetic test results may affect insurance coverage of this surgery.   CANCER HISTORY:    Malignant neoplasm of upper-outer quadrant of left breast in female, estrogen receptor positive ([place])   07/13/2018 Initial Diagnosis     Palpable left breast mass, 2 adjacent masses at 2 o'clock position 2 cm biopsy IDC grade 2-3, Ig DCIS, ER 95%, PR 90%, Ki-67 30%, HER-2 -1+; 2:30 position intramammary lymph node 1.3 cm biopsy positive; left axillary lymph node biopsy negative discordant, T2N1, stage IIa    07/25/2018 Cancer Staging    Staging form: Breast, AJCC 8th Edition - Clinical: Stage IIA (cT1c, cN1, cM0, G3, ER+, PR+, HER2-) - Signed by Nicholas Lose, MD on 07/25/2018     HORMONAL RISK FACTORS:  Menarche was at age 80.  First live birth at age 32.  Ovaries intact: yes.  Hysterectomy: yes.  Menopausal status: patient does not know, has just recently been tested- results unk  HRT use: 0 years. Colonoscopy: yes; every 5 yeras, has  has a few adenomatous polyps.. Mammogram within the last year: yes. Up to date with pelvic exams:  yes.  Past Medical History:  Diagnosis Date  . ANEMIA-NOS 04/23/2008  . Aneurysm ([place]) 10/19/2017   Cerebral Followed by Dr. Catalina Gravel  . Anginal pain ([place])    went to ED in april 2018 c/o chest pain over last 2 months ; had EKG, CXR  and troponin negative per physician suspected musculoskeletal  ; dc'd with ibuprofen  and recc f/u with outpatient stress test; see care everywhere ED visit  ; patient denies recurrence of Chest pain since, endorses occ palpitations   . Coughing    only coughing up clear sputum; to see her PCP today at 2:45 to address sx   . DEPRESSION 04/23/2008  . DJD (degenerative joint disease)   . Family history of breast cancer   . Hyperplastic colon polyp    x2  . Hypertension   . HYPOTHYROIDISM 10/17/2007  . Osteoarthritis   . Palpitations    freq at night;  at pre-op states she drinks caffeine and that makes it worse; says the palpitations started after they took her thyroid   . PAT 10/27/2009   Qualifier: Diagnosis of  By: [name], MD, [name]    . PONV (postoperative nausea and vomiting)    only after 1979 surgery  . RA (rheumatoid arthritis) ([place])    "problems in feet, hands, and knees"  . 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  SVT (supraventricular tachycardia) ([facility])    chronic   . Tubulovillous adenoma of colon     Past Surgical History:  Procedure Laterality Date  . ABDOMINAL HYSTERECTOMY    . APPENDECTOMY    . BUNIONECTOMY  10/2009 right & 02/03/10 left  . COLONOSCOPY W/ POLYPECTOMY  2015  . HAMMER TOE SURGERY     "all toes have pins, done with bunionectomy"  . INGUINAL HERNIA REPAIR    . KNEE CLOSED REDUCTION Left 12/05/2017   Procedure: CLOSED MANIPULATION LEFT KNEE;  Surgeon: [name], MD;  Location: [facility];  Service: Orthopedics;  Laterality: Left;  . neck fusion     x3  . THYROIDECTOMY  2001   for nodules  . TOE AMPUTATION  1979    6th toe removed from each foot  . TOTAL KNEE ARTHROPLASTY Left 10/21/2014   Procedure: LEFT TOTAL KNEE ARTHROPLASTY;  Surgeon: [name], MD;  Location: [facility];  Service: Orthopedics;  Laterality: Left;  . TOTAL KNEE ARTHROPLASTY Right 12/05/2017   Procedure: RIGHT TOTAL KNEE ARTHROPLASTY;  Surgeon: [name], MD;  Location: [facility];  Service: Orthopedics;  Laterality: Right;  70 mins    Social History   Socioeconomic History  . Marital status: Married    Spouse name: Not on file  . Number of children: 2  . Years of education: 52  . Highest education level: Not on file  Occupational History    Employer: [employer]  Social Needs  . Financial resource strain: Not on file  . Food insecurity:    Worry: Not on file    Inability: Not on file  . Transportation needs:    Medical: Not on file    Non-medical: Not on file  Tobacco Use  . Smoking status: Former Smoker    Packs/day: 0.50    Years: 33.00    Pack years: 16.50    Last attempt to quit: 09/15/2011    Years since quitting: 6.8  . Smokeless tobacco: Never Used  Substance and Sexual Activity  . Alcohol use: Yes    Comment: rare  . Drug use: No  . Sexual activity: Not on file  Lifestyle  . Physical activity:    Days per week: Not on file    Minutes per session: Not on file  . Stress: Not on file  Relationships  . Social connections:    Talks on phone: Not on file    Gets together: Not on file    Attends religious service: Not on file    Active member of club or organization: Not on file    Attends meetings of clubs or organizations: Not on file    Relationship status: Not on file  Other Topics Concern  . 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 Married - 2006  1 son '92  1 dtr '94. Work - at [employer].     FAMILY HISTORY:  We obtained a detailed, 4-generation family history.  Significant diagnoses are listed below: Family History  Problem Relation Age of Onset  .  Breast cancer Mother 67       metastatic to brain, died at 23  . Dementia Father        died at 47  . Heart attack Maternal Grandmother   . Stroke Maternal Grandmother   . Diabetes Maternal Grandmother   . Heart disease Maternal Grandmother   . Thyroid disease Maternal Grandmother   . COPD  Maternal Aunt   . Cancer Paternal Uncle        details unk  . Esophageal cancer Neg Hx   . Rectal cancer Neg Hx   . Stomach cancer Neg Hx    Patient reports she was not raised by her biological family, and therefore has little information about their health.  What she knows is outlined below.   Paula Jacobs has a daughter, Levander Campion, who is 10 with no hx of cancer.  She has a 82 year-old son, Aaron Edelman, who also has no hx of cancer. Aaron Edelman has a 15 month old son  Paula Jacobs has 1 full sister, Levada Dy, with no hx of cancer.  She has 3 other maternal half-sisters and 6 maternal half-brothers with no known hx of cancer.  No nieces or nephews with any known hx of cancer.   Paula Jacobs father: died at 43 with dementia.  Paternal aunts/Uncles: 1 paternal uncle, Thayer Jew, had cancer type unk.   No other info known about her paternal family history.   Paula Jacobs mother: dx with breast cancer at 74, metastatic to brain, died at 4.  Maternal Aunts/Uncles: pt has 1 maternal aunt who died of COPD. She has a lot of maternal cousins, no cancer she is aware of, but does not have a lot of information about these relatives.  Maternal grandfather: unk Maternal grandmother:unk  Ms. Timothy is unaware of previous family history of genetic testing for hereditary cancer risks. Patient's maternal ancestors are of African American/ Native American descent, and paternal ancestors are of African American descent. There is no reported Ashkenazi Jewish ancestry. There is no known consanguinity.  GENETIC COUNSELING ASSESSMENT: PARMINDER TRAPANI is a 57 y.o. female with a personal and family history which is somewhat suggestive of  a Hereditary Cancer Predisposition Syndrome. We, therefore, discussed and recommended the following at today's visit.   DISCUSSION: We reviewed the characteristics, features and inheritance patterns of hereditary cancer syndromes. We also discussed genetic testing, including the appropriate family members to test, the process of testing, insurance coverage and turn-around-time for results. We discussed the implications of a negative, positive and/or variant of uncertain significant result. In order to get genetic test results in a timely manner so that Paula Jacobs can use these genetic test results for surgical decisions, we recommended Paula Jacobs pursue genetic testing for the BRCAPlus (ordered STAT).   BRCAPlus: ATM, BRCA1, BRCA2, CHEK2, CDH1, PALB2, PTEN, TP53  We then recommend Paula Jacobs pursue reflex genetic testing to the CancerNext + RNA insight gene panel.   The CancerNext gene panel offered by [employer] includes sequencing and rearrangement analysis for the following 32 genes:   APC, ATM, BARD1, BMPR1A, BRCA1, BRCA2, BRIP1, CDH1, CDK4, CDKN2A, CHEK2, DICER1, EPCAM, GREM1, HOXB13MLH1, MRE11A, MSH2, MSH6, MUTYH, NBN, NF1, PALB2, PMS2, POLD1, POLE, PTEN, RAD50, RAD51D, SMAD4, SMARCA4, STK11, and TP53.   We discussed that only 5-10% of cancers are associated with a Hereditary cancer predisposition syndrome.  One of the most common hereditary cancer syndromes that increases breast cancer risk is called Hereditary Breast and Ovarian Cancer (HBOC) syndrome.  This syndrome is caused by mutations in the BRCA1 and BRCA2 genes.  This syndrome increases an individual's lifetime risk to develop breast, ovarian, pancreatic, and other types of cancer.  There are also many other cancer predisposition syndromes caused by mutations in several other genes.  We discussed that if she is found to have a mutation in one of these genes, it may impact surgical decisions,  and alter future medical management  recommendations such as increased cancer screenings and consideration of risk reducing surgeries.  A positive result could also have implications for the patient's family members.  A Negative result would mean we were unable to identify a hereditary component to her cancer, but does not rule out the possibility of a hereditary basis for her cancer.  There could be mutations that are undetectable by current technology, or in genes not yet tested or identified to increase cancer risk.    We discussed the potential to find a Variant of Uncertain Significance or VUS.  These are variants that have not yet been identified as pathogenic or benign, and it is unknown if this variant is associated with increased cancer risk or if this is a normal finding.  Most VUS's are reclassified to benign or likely benign.   It should not be used to make medical management decisions. With time, we suspect the lab will determine the significance of any VUS's identified if any.   Based on Paula Jacobs's personal and family history of cancer, she meets medical criteria for genetic testing. Despite that she meets criteria, she may still have an out of pocket cost. The laboratory can provide her with an estimate of her OOP cost. she was given the contact information of the laboratory if she has further questions.   PLAN: After considering the risks, benefits, and limitations, Paula Jacobs  provided informed consent to pursue genetic testing and the blood sample was sent to [facility] for analysis of the BRCAPlus reflex CancerNext + RNAinsight. Preliminary results should be available within approximately 5-12 days' time, at which point they will be disclosed by telephone to Paula Jacobs, as will any additional recommendations warranted by these results. Paula Jacobs will receive a summary of her genetic counseling visit and a copy of her results once available. This information will also be available in Epic. We encouraged Ms.  Jacobs to remain in contact with cancer genetics annually so that we can continuously update the family history and inform her of any changes in cancer genetics and testing that may be of benefit for her family. Paula Jacobs questions were answered to her satisfaction today. Our contact information was provided should additional questions or concerns arise.  Based on Paula Jacobs's family history, we recommended her maternal relatives also, have genetic counseling and testing. Paula Jacobs will let us know if we can be of any assistance in coordinating genetic counseling and/or testing for this family member.   Lastly, we encouraged Paula Jacobs to remain in contact with cancer genetics annually so that we can continuously update the family history and inform her of any changes in cancer genetics and testing that may be of benefit for this family.   Ms.  Streck questions were answered to her satisfaction today. Our contact information was provided should additional questions or concerns arise. Thank you for the referral and allowing [place] to share in the care of your patient.   Tana Felts, MS, [facility] Certified Genetic Counselor [email or web address] phone: [phone]  The patient was seen for a total of 35 minutes in face-to-face genetic counseling. This patient was discussed with Drs. Magrinat, Lindi Adie and/or Burr Medico who agrees with the above.

## 2018-08-02 ENCOUNTER — Telehealth: Payer: Self-pay | Admitting: *Deleted

## 2018-08-02 NOTE — Telephone Encounter (Signed)
Left vm for pt regarding BMDC from 11.6.19. Request return call with questions or needs. Contact information provided.

## 2018-08-06 ENCOUNTER — Telehealth: Payer: Self-pay | Admitting: Genetics

## 2018-08-06 NOTE — Telephone Encounter (Signed)
Reveled negative genetic testing for the STAT panel (high risk breast genes).  The lab is still working on analyzing the other genes on the panel and doing RNA analysis.  However, these are preliminary results that are helpful for Paula Jacobs in making her upcoming breast surgical decision.   I will call her when we have the full results back.

## 2018-08-07 ENCOUNTER — Telehealth: Payer: Self-pay | Admitting: *Deleted

## 2018-08-07 NOTE — Telephone Encounter (Signed)
Spoke to pt regarding sx. Pt would like to have have bilateral mastectomy with delayed reconstruction. Gave pt number to Dr. Iran Planas to r/s missed appt to discuss reconstruction. Physician team notified of pt decision.

## 2018-08-08 ENCOUNTER — Telehealth: Payer: Self-pay | Admitting: Genetics

## 2018-08-08 ENCOUNTER — Ambulatory Visit: Payer: Self-pay | Admitting: Genetics

## 2018-08-08 ENCOUNTER — Encounter: Payer: Self-pay | Admitting: Genetics

## 2018-08-08 DIAGNOSIS — Z1379 Encounter for other screening for genetic and chromosomal anomalies: Secondary | ICD-10-CM | POA: Insufficient documentation

## 2018-08-08 NOTE — Telephone Encounter (Signed)
Revealed negative genetic testing.     This normal result is reassuring and indicates that it is unlikely Paula Jacobs's cancer is due to a hereditary cause.  It is unlikely that there is an increased risk of another cancer due to a mutation in one of these genes.  However, genetic testing is not perfect, and cannot definitively rule out a hereditary cause.  It will be important for her to keep in contact with genetics to learn if any additional testing may be needed in the future.     Still recommended her siblings and other maternal relatives also consider genetic testing because there could still be a mutation in the family that caused her mother's cancer that Paula Jacobs did not inherit and therefore was not found in her.

## 2018-08-08 NOTE — Progress Notes (Signed)
HPI:  Ms. Nies was previously seen in the [facility] clinic on 07/30/2018 due to a personal and family history of breast cancer and concerns regarding a hereditary predisposition to cancer. Please refer to our prior cancer genetics clinic note for more information regarding Ms. Dawkins's medical, social and family histories, and our assessment and recommendations, at the time. Ms. Fread recent genetic test results were disclosed to her, as well as recommendations warranted by these results. These results and recommendations are discussed in more detail below.  CANCER HISTORY:    Malignant neoplasm of upper-outer quadrant of left breast in female, estrogen receptor positive ([place])   07/13/2018 Initial Diagnosis     Palpable left breast mass, 2 adjacent masses at 2 o'clock position 2 cm biopsy IDC grade 2-3, Ig DCIS, ER 95%, PR 90%, Ki-67 30%, HER-2 -1+; 2:30 position intramammary lymph node 1.3 cm biopsy positive; left axillary lymph node biopsy negative discordant, T2N1, stage IIa    07/25/2018 Cancer Staging    Staging form: Breast, AJCC 8th Edition - Clinical: Stage IIA (cT1c, cN1, cM0, G3, ER+, PR+, HER2-) - Signed by Nicholas Lose, MD on 07/25/2018      FAMILY HISTORY:  We obtained a detailed, 4-generation family history.  Significant diagnoses are listed below: Family History  Problem Relation Age of Onset  . Breast cancer Mother 15       metastatic to brain, died at 70  . Dementia Father        died at 12  . Heart attack Maternal Grandmother   . Stroke Maternal Grandmother   . Diabetes Maternal Grandmother   . Heart disease Maternal Grandmother   . Thyroid disease Maternal Grandmother   . COPD Maternal Aunt   . Cancer Paternal Uncle        details unk  . Esophageal cancer Neg Hx   . Rectal cancer Neg Hx   . Stomach cancer Neg Hx     Patient reports she was not raised by her biological family, and therefore has little information about their health.  What  she knows is outlined below.   Ms. Suchan has a daughter, Levander Campion, who is 22 with no hx of cancer.  She has a 48 year-old son, Aaron Edelman, who also has no hx of cancer. Aaron Edelman has a 42 month old son  Ms. Lown has 1 full sister, Levada Dy, with no hx of cancer.  She has 3 other maternal half-sisters and 6 maternal half-brothers with no known hx of cancer.  No nieces or nephews with any known hx of cancer.   Ms. Lukins father: died at 74 with dementia.  Paternal aunts/Uncles: 1 paternal uncle, Thayer Jew, had cancer type unk.   No other info known about her paternal family history.   Ms. Welford mother: dx with breast cancer at 8, metastatic to brain, died at 61.  Maternal Aunts/Uncles: pt has 1 maternal aunt who died of COPD. She has a lot of maternal cousins, no cancer she is aware of, but does not have a lot of information about these relatives.  Maternal grandfather: unk Maternal grandmother:unk  Ms. Hohmann is unaware of previous family history of genetic testing for hereditary cancer risks. Patient's maternal ancestors are of African American/ Native American descent, and paternal ancestors are of African American descent. There is no reported Ashkenazi Jewish ancestry. There is no known consanguinity.  GENETIC TEST RESULTS: Genetic testing performed through [facility]'s Cancernext + RNA insight panel reported out on 08/08/2018 showed no pathogenic  mutations. The CancerNext + RNA insight gene panel offered by [employer] includes sequencing and rearrangement analysis for the following 34 genes:   APC*, ATM*, BARD1, BMPR1A, BRCA1*, BRCA2*, BRIP1*, CDH1*, CDK4, CDKN2A, CHEK2*, DICER1,HOXB13, MLH1*, MRE11A, MSH2*, MSH6*, MUTYH*, NBN, NF1*, PALB2*, PMS2*, POLD1, POLE, PTEN*, RAD50, RAD51C*, RAD51D*, SMAD4, SMARCA4, STK11 and TP53* (sequencing and deletion/duplication); EPCAM and GREM1 (deletion/duplication only). DNA and RNA analyses performed for * genes. .  The test report will be scanned into  EPIC and will be located under the Molecular Pathology section of the Results Review tab. A portion of the result report is included below for reference.     We discussed with Ms. Orihuela that because current genetic testing is not perfect, it is possible there may be a gene mutation in one of these genes that current testing cannot detect, but that chance is small.  We also discussed, that there could be another gene that has not yet been discovered, or that we have not yet tested, that is responsible for the cancer diagnoses in the family. It is also possible there is a hereditary cause for the cancer in the family that Ms. Birch did not inherit and therefore was not identified in her testing.  Therefore, it is important to remain in touch with cancer genetics in the future so that we can continue to offer Ms. Casa the most up to date genetic testing.   ADDITIONAL GENETIC TESTING: We discussed with Ms. Taliaferro that her genetic testing was fairly extensive.  If there are are genes identified to increase cancer risk that can be analyzed in the future, we would be happy to discuss and coordinate this testing at that time.    CANCER SCREENING RECOMMENDATIONS: Ms. Cedeno test result is negative (normal).  This means that we have not identified a hereditary cause for her personal and family history of cancer at this time. This result indicates that it is unlikely Ms. Docter has an increased risk for a future cancer due to a mutation in one of these genes.  While reassuring, this does not definitively rule out a hereditary predisposition to cancer. It is still possible that there could be genetic mutations that are undetectable by current technology, or genetic mutations in genes that have not been tested or identified to increase cancer risk.  Therefore, it is recommended she continue to follow the cancer management and screening guidelines provided by her oncology and primary healthcare provider. An  individual's cancer risk is not determined by genetic test results alone.  Overall cancer risk assessment includes additional factors such as personal medical history, family history, etc.  These should be used to make a personalized plan for cancer prevention and surveillance.    RECOMMENDATIONS FOR FAMILY MEMBERS:  Relatives in this family might be at some increased risk of developing cancer, over the general population risk, simply due to the family history of cancer.  We recommended women in this family have a yearly mammogram beginning at age 12, or 10 years younger than the earliest onset of cancer, an annual clinical breast exam, and perform monthly breast self-exams. Women in this family should also have a gynecological exam as recommended by their primary provider. All family members should have a colonoscopy as directed by their doctors.  All family members should inform their physicians about the family history of cancer so their doctors can make the most appropriate screening recommendations for them.   It is also possible there is a hereditary cause for the  cancer in Ms. Leedom's family that she did not inherit and therefore was not identified in her.   Therefore, we recommended siblings and other maternal relatives also have genetic counseling and testing. Ms. Coppola will let us know if we can be of any assistance in coordinating genetic counseling and/or testing for these family members.   FOLLOW-UP: Lastly, we discussed with Ms. Thane that cancer genetics is a rapidly advancing field and it is possible that new genetic tests will be appropriate for her and/or her family members in the future. We encouraged her to remain in contact with cancer genetics on an annual basis so we can update her personal and family histories and let her know of advances in cancer genetics that may benefit this family.   Our contact number was provided. Ms. Fifield questions were answered to her  satisfaction, and she knows she is welcome to call us at anytime with additional questions or concerns.   Ferol Luz, MS, [facility] Certified Genetic Counselor [email or web address]

## 2018-08-09 ENCOUNTER — Other Ambulatory Visit: Payer: Self-pay | Admitting: Surgery

## 2018-08-09 DIAGNOSIS — C50312 Malignant neoplasm of lower-inner quadrant of left female breast: Secondary | ICD-10-CM

## 2018-08-09 DIAGNOSIS — Z170 Estrogen receptor positive status [ER+]: Principal | ICD-10-CM

## 2018-08-10 NOTE — Pre-Procedure Instructions (Addendum)
DORLEEN KISSEL  08/10/2018      [id number], [place] [address] Phone: [phone] Fax: [phone]    Your procedure is scheduled on Tues., Dec. 3, 2019 from 11:30AM-1:00PM  Report to [facility] Admitting Entrance "A" at 9:30AM  Call this number if you have problems the morning of surgery:  [phone]   Remember:  Do not eat after midnight on Dec. 2nd  You may drink clear liquids until 3 hours (8:30AM) prior to surgery.  Clear liquids allowed are:   Water, Juice (non-citric and without pulp), Carbonated beverages, Clear Tea, Black Coffee only, Plain Jell-O only, Gatorade and Plain Popsicles only   Please complete your PRE-SURGERY ENSURE that was given to by (8:30AM) the morning of surgery.  Please, if able, drink it in one setting. DO NOT SIP.    Take these medicines the morning of surgery with A SIP OF WATER: Levothyroxine (SYNTHROID, LEVOTHROID)  If needed: Acetaminophen (TYLENOL)  7 days before surgery (08/14/18), stop taking all Other Aspirin Products, Vitamins, Fish oils, and Herbal medications. Also stop all NSAIDS i.e. Advil, Ibuprofen, Motrin, Aleve, Anaprox, Naproxen, BC, Goody Powders, and all Supplements.    Do not wear jewelry, make-up or nail polish.  Do not wear lotions, powders, or perfumes, or deodorant.  Do not shave 48 hours prior to surgery.    Do not bring valuables to the hospital.  [facility] is not responsible for any belongings or valuables.  Contacts, dentures or bridgework may not be worn into surgery.  Leave your suitcase in the car.  After surgery it may be brought to your room.  For patients admitted to the hospital, discharge time will be determined by your treatment team.  Patients discharged the day of surgery will not be allowed to drive home.   Special instructions:  [place]- Preparing For  Surgery  Before surgery, you can play an important role. Because skin is not sterile, your skin needs to be as free of germs as possible. You can reduce the number of germs on your skin by washing with CHG (chlorahexidine gluconate) Soap before surgery.  CHG is an antiseptic cleaner which kills germs and bonds with the skin to continue killing germs even after washing.    Oral Hygiene is also important to reduce your risk of infection.  Remember - BRUSH YOUR TEETH THE MORNING OF SURGERY WITH YOUR REGULAR TOOTHPASTE  Please do not use if you have an allergy to CHG or antibacterial soaps. If your skin becomes reddened/irritated stop using the CHG.  Do not shave (including legs and underarms) for at least 48 hours prior to first CHG shower. It is OK to shave your face.  Please follow these instructions carefully.   1. Shower the NIGHT BEFORE SURGERY and the MORNING OF SURGERY with CHG.   2. If you chose to wash your hair, wash your hair first as usual with your normal shampoo.  3. After you shampoo, rinse your hair and body thoroughly to remove the shampoo.  4. Use CHG as you would any other liquid soap. You can apply CHG directly to the skin and wash gently with a scrungie or a clean washcloth.   5. Apply the CHG Soap to your body ONLY FROM THE NECK DOWN.  Do not use on open wounds or open sores. Avoid contact with  your eyes, ears, mouth and genitals (private parts). Wash Face and genitals (private parts)  with your normal soap.  6. Wash thoroughly, paying special attention to the area where your surgery will be performed.  7. Thoroughly rinse your body with warm water from the neck down.  8. DO NOT shower/wash with your normal soap after using and rinsing off the CHG Soap.  9. Pat yourself dry with a CLEAN TOWEL.  10. Wear CLEAN PAJAMAS to bed the night before surgery, wear comfortable clothes the morning of surgery  11. Place CLEAN SHEETS on your bed the night of your first shower and  DO NOT SLEEP WITH PETS.  Day of Surgery:  Do not apply any deodorants/lotions.  Please wear clean clothes to the hospital/surgery center.   Remember to brush your teeth WITH YOUR REGULAR TOOTHPASTE.  Please read over the following fact sheets that you were given. Pain Booklet, Coughing and Deep Breathing and Surgical Site Infection Prevention

## 2018-08-13 ENCOUNTER — Other Ambulatory Visit: Payer: Self-pay

## 2018-08-13 ENCOUNTER — Encounter (HOSPITAL_COMMUNITY): Payer: Self-pay

## 2018-08-13 ENCOUNTER — Encounter (HOSPITAL_COMMUNITY)
Admission: RE | Admit: 2018-08-13 | Discharge: 2018-08-13 | Disposition: A | Discharge status: Home / Self Care | Payer: Managed Care, Other (non HMO) | Source: Ambulatory Visit | Attending: Surgery | Admitting: Surgery

## 2018-08-13 DIAGNOSIS — Z01812 Encounter for preprocedural laboratory examination: Secondary | ICD-10-CM | POA: Diagnosis not present

## 2018-08-13 HISTORY — DX: Pneumonia, unspecified organism: J18.9

## 2018-08-13 LAB — BASIC METABOLIC PANEL
ANION GAP: 7 (ref 5–15)
BUN: 11 mg/dL (ref 6–20)
CALCIUM: 9 mg/dL (ref 8.9–10.3)
CO2: 25 mmol/L (ref 22–32)
CREATININE: 0.71 mg/dL (ref 0.44–1.00)
Chloride: 107 mmol/L (ref 98–111)
GFR calc non Af Amer: >60 mL/min (ref >60)
Glucose, Bld: 94 mg/dL (ref 70–99)
Potassium: 3.8 mmol/L (ref 3.5–5.1)
SODIUM: 139 mmol/L (ref 135–145)

## 2018-08-13 LAB — CBC
HCT: 40.5 % (ref 36.0–46.0)
Hemoglobin: 12 g/dL (ref 12.0–15.0)
MCH: 26.1 pg (ref 26.0–34.0)
MCHC: 29.6 g/dL — ABNORMAL LOW (ref 30.0–36.0)
MCV: 88.2 fL (ref 80.0–100.0)
NRBC: 0 % (ref 0.0–0.2)
PLATELETS: 412 10*3/uL — AB (ref 150–400)
RBC: 4.59 MIL/uL (ref 3.87–5.11)
RDW: 13.7 % (ref 11.5–15.5)
WBC: 7.4 10*3/uL (ref 4.0–10.5)

## 2018-08-13 NOTE — Progress Notes (Signed)
PCP - Dr. Maudie Mercury Cardiologist - n/a  Chest x-ray - 10/23/2017 EKG - 10/19/2017 Stress Test - patient denies ECHO - 10/22/2017 Cardiac Cath - patient denies  Sleep Study - patient denies  Blood Thinner Instructions:n/a Aspirin Instructions:n/a  Anesthesia review: abnormal EKG  Patient denies shortness of breath, fever, cough and chest pain at PAT appointment   Patient verbalized understanding of instructions that were given to them at the PAT appointment. Patient was also instructed that they will need to review over the PAT instructions again at home before surgery.

## 2018-08-14 ENCOUNTER — Encounter (HOSPITAL_COMMUNITY): Payer: Self-pay

## 2018-08-14 ENCOUNTER — Telehealth: Payer: Self-pay | Admitting: Hematology and Oncology

## 2018-08-14 NOTE — Telephone Encounter (Signed)
Scheduled appt per 11/22 sch message - pt is aware of appt date and time   

## 2018-08-20 ENCOUNTER — Ambulatory Visit
Admission: RE | Admit: 2018-08-20 | Discharge: 2018-08-20 | Disposition: A | Discharge status: Home / Self Care | Payer: Managed Care, Other (non HMO) | Source: Ambulatory Visit | Attending: Surgery | Admitting: Surgery

## 2018-08-20 ENCOUNTER — Other Ambulatory Visit: Payer: Self-pay | Admitting: Surgery

## 2018-08-20 DIAGNOSIS — Z170 Estrogen receptor positive status [ER+]: Principal | ICD-10-CM

## 2018-08-20 DIAGNOSIS — C50312 Malignant neoplasm of lower-inner quadrant of left female breast: Secondary | ICD-10-CM

## 2018-08-20 MED ORDER — CEFAZOLIN SODIUM-DEXTROSE 2-4 GM/100ML-% IV SOLN
2.0000 g | INTRAVENOUS | Status: AC
  Administered 2018-08-21: 2 g via INTRAVENOUS
  Filled 2018-08-20: qty 100

## 2018-08-21 ENCOUNTER — Encounter (HOSPITAL_COMMUNITY)
Admission: AD | Disposition: A | Discharge status: Home / Self Care | Payer: Self-pay | Source: Ambulatory Visit | Attending: Surgery

## 2018-08-21 ENCOUNTER — Ambulatory Visit
Admission: RE | Admit: 2018-08-21 | Discharge: 2018-08-21 | Disposition: A | Discharge status: Home / Self Care | Payer: Managed Care, Other (non HMO) | Source: Ambulatory Visit | Attending: Surgery | Admitting: Surgery

## 2018-08-21 ENCOUNTER — Ambulatory Visit (HOSPITAL_COMMUNITY): Payer: Managed Care, Other (non HMO) | Admitting: Physician Assistant

## 2018-08-21 ENCOUNTER — Ambulatory Visit (HOSPITAL_COMMUNITY): Payer: Managed Care, Other (non HMO) | Admitting: Anesthesiology

## 2018-08-21 ENCOUNTER — Encounter (HOSPITAL_COMMUNITY): Payer: Self-pay | Admitting: Anesthesiology

## 2018-08-21 ENCOUNTER — Inpatient Hospital Stay (HOSPITAL_COMMUNITY)
Admission: RE | Admit: 2018-08-21 | Discharge: 2018-08-21 | Disposition: A | Discharge status: Home / Self Care | Payer: Managed Care, Other (non HMO) | Source: Ambulatory Visit | Attending: Surgery | Admitting: Surgery

## 2018-08-21 ENCOUNTER — Inpatient Hospital Stay (HOSPITAL_COMMUNITY)
Admission: AD | Admit: 2018-08-21 | Discharge: 2018-08-23 | DRG: 580 | Disposition: A | Discharge status: Home / Self Care | Payer: Managed Care, Other (non HMO) | Source: Ambulatory Visit | Attending: Surgery | Admitting: Surgery

## 2018-08-21 DIAGNOSIS — Z170 Estrogen receptor positive status [ER+]: Principal | ICD-10-CM

## 2018-08-21 DIAGNOSIS — I10 Essential (primary) hypertension: Secondary | ICD-10-CM | POA: Diagnosis present

## 2018-08-21 DIAGNOSIS — C50312 Malignant neoplasm of lower-inner quadrant of left female breast: Secondary | ICD-10-CM

## 2018-08-21 DIAGNOSIS — E039 Hypothyroidism, unspecified: Secondary | ICD-10-CM | POA: Diagnosis present

## 2018-08-21 DIAGNOSIS — Z91040 Latex allergy status: Secondary | ICD-10-CM

## 2018-08-21 DIAGNOSIS — C773 Secondary and unspecified malignant neoplasm of axilla and upper limb lymph nodes: Secondary | ICD-10-CM | POA: Diagnosis present

## 2018-08-21 DIAGNOSIS — C50912 Malignant neoplasm of unspecified site of left female breast: Principal | ICD-10-CM | POA: Diagnosis present

## 2018-08-21 DIAGNOSIS — Z885 Allergy status to narcotic agent status: Secondary | ICD-10-CM

## 2018-08-21 DIAGNOSIS — E669 Obesity, unspecified: Secondary | ICD-10-CM | POA: Diagnosis present

## 2018-08-21 DIAGNOSIS — Z6836 Body mass index (BMI) 36.0-36.9, adult: Secondary | ICD-10-CM

## 2018-08-21 DIAGNOSIS — Z87891 Personal history of nicotine dependence: Secondary | ICD-10-CM

## 2018-08-21 DIAGNOSIS — Z79899 Other long term (current) drug therapy: Secondary | ICD-10-CM

## 2018-08-21 HISTORY — PX: MASTECTOMY WITH RADIOACTIVE SEED GUIDED EXCISION AND AXILLARY SENTINEL LYMPH NODE BIOPSY: SHX6736

## 2018-08-21 LAB — CREATININE, SERUM
Creatinine, Ser: 0.92 mg/dL (ref 0.44–1.00)
GFR calc Af Amer: >60 mL/min (ref >60)
GFR calc non Af Amer: >60 mL/min (ref >60)

## 2018-08-21 LAB — CBC
HCT: 34.5 % — ABNORMAL LOW (ref 36.0–46.0)
HEMOGLOBIN: 10.3 g/dL — AB (ref 12.0–15.0)
MCH: 26 pg (ref 26.0–34.0)
MCHC: 29.9 g/dL — ABNORMAL LOW (ref 30.0–36.0)
MCV: 87.1 fL (ref 80.0–100.0)
Platelets: 419 10*3/uL — ABNORMAL HIGH (ref 150–400)
RBC: 3.96 MIL/uL (ref 3.87–5.11)
RDW: 13.7 % (ref 11.5–15.5)
WBC: 15.5 10*3/uL — ABNORMAL HIGH (ref 4.0–10.5)
nRBC: 0 % (ref 0.0–0.2)

## 2018-08-21 SURGERY — MASTECTOMY WITH RADIOACTIVE SEED GUIDED EXCISION AND AXILLARY SENTINEL LYMPH NODE BIOPSY
Anesthesia: General | Site: Breast | Laterality: Bilateral

## 2018-08-21 MED ORDER — FENTANYL CITRATE (PF) 100 MCG/2ML IJ SOLN
25.0000 ug | INTRAMUSCULAR | Status: DC | PRN
  Administered 2018-08-21: 50 ug via INTRAVENOUS
  Administered 2018-08-21 (×2): 25 ug via INTRAVENOUS

## 2018-08-21 MED ORDER — MIDAZOLAM HCL 2 MG/2ML IJ SOLN
2.0000 mg | Freq: Once | INTRAMUSCULAR | Status: AC
  Administered 2018-08-21: 2 mg via INTRAVENOUS
  Filled 2018-08-21: qty 2

## 2018-08-21 MED ORDER — CEFAZOLIN SODIUM-DEXTROSE 2-4 GM/100ML-% IV SOLN
2.0000 g | Freq: Three times a day (TID) | INTRAVENOUS | Status: AC
  Administered 2018-08-21: 2 g via INTRAVENOUS
  Filled 2018-08-21: qty 100

## 2018-08-21 MED ORDER — BENAZEPRIL HCL 10 MG PO TABS
10.0000 mg | ORAL_TABLET | Freq: Every day | ORAL | Status: DC
  Administered 2018-08-22: 10 mg via ORAL
  Filled 2018-08-21 (×3): qty 1

## 2018-08-21 MED ORDER — KETOROLAC TROMETHAMINE 30 MG/ML IJ SOLN
30.0000 mg | Freq: Four times a day (QID) | INTRAMUSCULAR | Status: AC
  Administered 2018-08-21: 30 mg via INTRAVENOUS

## 2018-08-21 MED ORDER — GABAPENTIN 300 MG PO CAPS
300.0000 mg | ORAL_CAPSULE | Freq: Two times a day (BID) | ORAL | Status: DC
  Administered 2018-08-21 – 2018-08-23 (×4): 300 mg via ORAL
  Filled 2018-08-21 (×4): qty 1

## 2018-08-21 MED ORDER — ONDANSETRON HCL 4 MG/2ML IJ SOLN
INTRAMUSCULAR | Status: DC | PRN
  Administered 2018-08-21: 4 mg via INTRAVENOUS

## 2018-08-21 MED ORDER — SUGAMMADEX SODIUM 200 MG/2ML IV SOLN
INTRAVENOUS | Status: DC | PRN
  Administered 2018-08-21: 200 mg via INTRAVENOUS

## 2018-08-21 MED ORDER — CELECOXIB 200 MG PO CAPS
200.0000 mg | ORAL_CAPSULE | Freq: Two times a day (BID) | ORAL | Status: DC
  Administered 2018-08-21 – 2018-08-23 (×4): 200 mg via ORAL
  Filled 2018-08-21 (×4): qty 1

## 2018-08-21 MED ORDER — CHLORHEXIDINE GLUCONATE CLOTH 2 % EX PADS: 6.0000 | MEDICATED_PAD | Freq: Once | CUTANEOUS | Status: DC

## 2018-08-21 MED ORDER — CELECOXIB 200 MG PO CAPS
400.0000 mg | ORAL_CAPSULE | ORAL | Status: AC
  Administered 2018-08-21: 400 mg via ORAL
  Filled 2018-08-21: qty 2

## 2018-08-21 MED ORDER — LIDOCAINE HCL (CARDIAC) PF 100 MG/5ML IV SOSY
PREFILLED_SYRINGE | INTRAVENOUS | Status: DC | PRN
  Administered 2018-08-21: 30 mg via INTRAVENOUS

## 2018-08-21 MED ORDER — ONDANSETRON HCL 4 MG/2ML IJ SOLN
INTRAMUSCULAR | Status: AC
  Filled 2018-08-21: qty 2

## 2018-08-21 MED ORDER — ROCURONIUM BROMIDE 100 MG/10ML IV SOLN
INTRAVENOUS | Status: DC | PRN
  Administered 2018-08-21: 20 mg via INTRAVENOUS
  Administered 2018-08-21 (×2): 10 mg via INTRAVENOUS

## 2018-08-21 MED ORDER — FENTANYL CITRATE (PF) 100 MCG/2ML IJ SOLN
INTRAMUSCULAR | Status: DC | PRN
  Administered 2018-08-21: 100 ug via INTRAVENOUS
  Administered 2018-08-21: 150 ug via INTRAVENOUS
  Administered 2018-08-21 (×2): 50 ug via INTRAVENOUS
  Administered 2018-08-21: 100 ug via INTRAVENOUS

## 2018-08-21 MED ORDER — ONDANSETRON 4 MG PO TBDP: 4.0000 mg | ORAL_TABLET | Freq: Four times a day (QID) | ORAL | Status: DC | PRN

## 2018-08-21 MED ORDER — TECHNETIUM TC 99M SULFUR COLLOID FILTERED
1.0000 | Freq: Once | INTRAVENOUS | Status: AC | PRN
  Administered 2018-08-21: 1 via INTRADERMAL

## 2018-08-21 MED ORDER — KCL IN DEXTROSE-NACL 20-5-0.9 MEQ/L-%-% IV SOLN
INTRAVENOUS | Status: DC
  Administered 2018-08-21: 22:00:00 via INTRAVENOUS
  Filled 2018-08-21 (×2): qty 1000

## 2018-08-21 MED ORDER — FENTANYL CITRATE (PF) 100 MCG/2ML IJ SOLN
100.0000 ug | Freq: Once | INTRAMUSCULAR | Status: AC
  Administered 2018-08-21: 50 ug via INTRAVENOUS
  Filled 2018-08-21: qty 2

## 2018-08-21 MED ORDER — SODIUM CHLORIDE (PF) 0.9 % IJ SOLN
INTRAMUSCULAR | Status: AC
  Filled 2018-08-21: qty 10

## 2018-08-21 MED ORDER — 0.9 % SODIUM CHLORIDE (POUR BTL) OPTIME
TOPICAL | Status: DC | PRN
  Administered 2018-08-21 (×2): 1000 mL

## 2018-08-21 MED ORDER — PROMETHAZINE HCL 25 MG/ML IJ SOLN: 6.2500 mg | INTRAMUSCULAR | Status: DC | PRN

## 2018-08-21 MED ORDER — ONDANSETRON HCL 4 MG/2ML IJ SOLN: 4.0000 mg | Freq: Four times a day (QID) | INTRAMUSCULAR | Status: DC | PRN

## 2018-08-21 MED ORDER — BUPIVACAINE-EPINEPHRINE (PF) 0.5% -1:200000 IJ SOLN
INTRAMUSCULAR | Status: DC | PRN
  Administered 2018-08-21 (×2): 25 mL

## 2018-08-21 MED ORDER — PHENYLEPHRINE 40 MCG/ML (10ML) SYRINGE FOR IV PUSH (FOR BLOOD PRESSURE SUPPORT)
PREFILLED_SYRINGE | INTRAVENOUS | Status: AC
  Filled 2018-08-21: qty 10

## 2018-08-21 MED ORDER — METHYLENE BLUE 0.5 % INJ SOLN
INTRAVENOUS | Status: AC
  Filled 2018-08-21: qty 10

## 2018-08-21 MED ORDER — PROPOFOL 10 MG/ML IV BOLUS
INTRAVENOUS | Status: DC | PRN
  Administered 2018-08-21: 200 mg via INTRAVENOUS

## 2018-08-21 MED ORDER — HYDRALAZINE HCL 20 MG/ML IJ SOLN: 10.0000 mg | INTRAMUSCULAR | Status: DC | PRN

## 2018-08-21 MED ORDER — MIDAZOLAM HCL 2 MG/2ML IJ SOLN
INTRAMUSCULAR | Status: AC
  Filled 2018-08-21: qty 2

## 2018-08-21 MED ORDER — FENTANYL CITRATE (PF) 250 MCG/5ML IJ SOLN
INTRAMUSCULAR | Status: AC
  Filled 2018-08-21: qty 5

## 2018-08-21 MED ORDER — ENOXAPARIN SODIUM 40 MG/0.4ML ~~LOC~~ SOLN
40.0000 mg | SUBCUTANEOUS | Status: DC
  Administered 2018-08-22: 40 mg via SUBCUTANEOUS
  Filled 2018-08-21: qty 0.4

## 2018-08-21 MED ORDER — KETOROLAC TROMETHAMINE 30 MG/ML IJ SOLN
INTRAMUSCULAR | Status: AC
  Filled 2018-08-21: qty 1

## 2018-08-21 MED ORDER — LACTATED RINGERS IV SOLN
INTRAVENOUS | Status: DC
  Administered 2018-08-21 (×2): via INTRAVENOUS

## 2018-08-21 MED ORDER — SODIUM CHLORIDE (PF) 0.9 % IJ SOLN
INTRAVENOUS | Status: DC | PRN
  Administered 2018-08-21: 12:00:00 via INTRAMUSCULAR

## 2018-08-21 MED ORDER — MIDAZOLAM HCL 5 MG/5ML IJ SOLN
INTRAMUSCULAR | Status: DC | PRN
  Administered 2018-08-21: 2 mg via INTRAVENOUS

## 2018-08-21 MED ORDER — PROPOFOL 10 MG/ML IV BOLUS
INTRAVENOUS | Status: AC
  Filled 2018-08-21: qty 20

## 2018-08-21 MED ORDER — SUCCINYLCHOLINE CHLORIDE 20 MG/ML IJ SOLN
INTRAMUSCULAR | Status: DC | PRN
  Administered 2018-08-21: 120 mg via INTRAVENOUS

## 2018-08-21 MED ORDER — HYDROCHLOROTHIAZIDE 12.5 MG PO CAPS
12.5000 mg | ORAL_CAPSULE | Freq: Every day | ORAL | Status: DC
  Administered 2018-08-22: 12.5 mg via ORAL
  Filled 2018-08-21 (×2): qty 1

## 2018-08-21 MED ORDER — DEXAMETHASONE SODIUM PHOSPHATE 10 MG/ML IJ SOLN
INTRAMUSCULAR | Status: AC
  Filled 2018-08-21: qty 1

## 2018-08-21 MED ORDER — ZOLPIDEM TARTRATE 5 MG PO TABS
5.0000 mg | ORAL_TABLET | Freq: Every evening | ORAL | Status: DC | PRN
  Administered 2018-08-21 – 2018-08-22 (×2): 5 mg via ORAL
  Filled 2018-08-21 (×2): qty 1

## 2018-08-21 MED ORDER — FENTANYL CITRATE (PF) 100 MCG/2ML IJ SOLN: 50.0000 ug | INTRAMUSCULAR | Status: DC | PRN

## 2018-08-21 MED ORDER — SCOPOLAMINE 1 MG/3DAYS TD PT72
MEDICATED_PATCH | TRANSDERMAL | Status: DC | PRN
  Administered 2018-08-21: 1 via TRANSDERMAL

## 2018-08-21 MED ORDER — DEXAMETHASONE SODIUM PHOSPHATE 10 MG/ML IJ SOLN
INTRAMUSCULAR | Status: DC | PRN
  Administered 2018-08-21: 10 mg via INTRAVENOUS

## 2018-08-21 MED ORDER — KETOROLAC TROMETHAMINE 30 MG/ML IJ SOLN
30.0000 mg | Freq: Four times a day (QID) | INTRAMUSCULAR | Status: DC | PRN
  Administered 2018-08-22: 30 mg via INTRAVENOUS
  Filled 2018-08-21: qty 1

## 2018-08-21 MED ORDER — BENAZEPRIL-HYDROCHLOROTHIAZIDE 10-12.5 MG PO TABS: 1.0000 | ORAL_TABLET | Freq: Every day | ORAL | Status: DC

## 2018-08-21 MED ORDER — METHOCARBAMOL 500 MG PO TABS
500.0000 mg | ORAL_TABLET | Freq: Four times a day (QID) | ORAL | Status: DC | PRN
  Administered 2018-08-22 – 2018-08-23 (×5): 500 mg via ORAL
  Filled 2018-08-21 (×5): qty 1

## 2018-08-21 MED ORDER — FENTANYL CITRATE (PF) 100 MCG/2ML IJ SOLN
INTRAMUSCULAR | Status: AC
  Administered 2018-08-21: 50 ug via INTRAVENOUS
  Filled 2018-08-21: qty 2

## 2018-08-21 MED ORDER — GABAPENTIN 300 MG PO CAPS
300.0000 mg | ORAL_CAPSULE | ORAL | Status: AC
  Administered 2018-08-21: 300 mg via ORAL
  Filled 2018-08-21: qty 1

## 2018-08-21 MED ORDER — OXYCODONE HCL 5 MG PO TABS
5.0000 mg | ORAL_TABLET | ORAL | Status: DC | PRN
  Administered 2018-08-21 – 2018-08-23 (×8): 10 mg via ORAL
  Filled 2018-08-21 (×8): qty 2

## 2018-08-21 SURGICAL SUPPLY — 52 items
APPLIER CLIP 9.375 MED OPEN (MISCELLANEOUS) ×2
BINDER BREAST LRG (GAUZE/BANDAGES/DRESSINGS) IMPLANT
BINDER BREAST XLRG (GAUZE/BANDAGES/DRESSINGS) IMPLANT
BINDER BREAST XXLRG (GAUZE/BANDAGES/DRESSINGS) ×2 IMPLANT
BIOPATCH RED 1 DISK 7.0 (GAUZE/BANDAGES/DRESSINGS) ×8 IMPLANT
CANISTER SUCT 3000ML PPV (MISCELLANEOUS) ×2 IMPLANT
CHLORAPREP W/TINT 26ML (MISCELLANEOUS) ×2 IMPLANT
CLIP APPLIE 9.375 MED OPEN (MISCELLANEOUS) ×1 IMPLANT
COVER SURGICAL LIGHT HANDLE (MISCELLANEOUS) ×2 IMPLANT
COVER TRANSDUCER ULTRASND GEL (DRAPE) ×2 IMPLANT
COVER WAND RF STERILE (DRAPES) IMPLANT
DERMABOND ADVANCED (GAUZE/BANDAGES/DRESSINGS) ×1
DERMABOND ADVANCED .7 DNX12 (GAUZE/BANDAGES/DRESSINGS) ×1 IMPLANT
DEVICE DUBIN SPECIMEN MAMMOGRA (MISCELLANEOUS) IMPLANT
DRAIN CHANNEL 19F RND (DRAIN) ×8 IMPLANT
DRAPE CHEST BREAST 15X10 FENES (DRAPES) IMPLANT
DRAPE LAPAROSCOPIC ABDOMINAL (DRAPES) ×2 IMPLANT
DRSG PAD ABDOMINAL 8X10 ST (GAUZE/BANDAGES/DRESSINGS) ×4 IMPLANT
DRSG TEGADERM 4X4.75 (GAUZE/BANDAGES/DRESSINGS) ×4 IMPLANT
ELECT BLADE 4.0 EZ CLEAN MEGAD (MISCELLANEOUS)
ELECT CAUTERY BLADE 6.4 (BLADE) IMPLANT
ELECT REM PT RETURN 9FT ADLT (ELECTROSURGICAL) ×2
ELECTRODE BLDE 4.0 EZ CLN MEGD (MISCELLANEOUS) IMPLANT
ELECTRODE REM PT RTRN 9FT ADLT (ELECTROSURGICAL) ×1 IMPLANT
EVACUATOR SILICONE 100CC (DRAIN) ×8 IMPLANT
GLOVE BIO SURGEON STRL SZ8 (GLOVE) IMPLANT
GLOVE BIOGEL PI IND STRL 8 (GLOVE) ×1 IMPLANT
GLOVE BIOGEL PI INDICATOR 8 (GLOVE) ×1
GOWN STRL REUS W/ TWL LRG LVL3 (GOWN DISPOSABLE) ×1 IMPLANT
GOWN STRL REUS W/ TWL XL LVL3 (GOWN DISPOSABLE) ×1 IMPLANT
GOWN STRL REUS W/TWL LRG LVL3 (GOWN DISPOSABLE) ×1
GOWN STRL REUS W/TWL XL LVL3 (GOWN DISPOSABLE) ×1
KIT BASIN OR (CUSTOM PROCEDURE TRAY) ×2 IMPLANT
KIT MARKER MARGIN INK (KITS) IMPLANT
KIT TURNOVER KIT B (KITS) ×2 IMPLANT
MARKER SKIN DUAL TIP RULER LAB (MISCELLANEOUS) ×2 IMPLANT
NEEDLE 18GX1X1/2 (RX/OR ONLY) (NEEDLE) ×2 IMPLANT
NEEDLE FILTER BLUNT 18X 1/2SAF (NEEDLE) ×1
NEEDLE FILTER BLUNT 18X1 1/2 (NEEDLE) ×1 IMPLANT
NEEDLE HYPO 25GX1X1/2 BEV (NEEDLE) ×2 IMPLANT
NS IRRIG 1000ML POUR BTL (IV SOLUTION) ×2 IMPLANT
PACK GENERAL/GYN (CUSTOM PROCEDURE TRAY) ×2 IMPLANT
PAD ABD 8X10 STRL (GAUZE/BANDAGES/DRESSINGS) ×8 IMPLANT
PAD ARMBOARD 7.5X6 YLW CONV (MISCELLANEOUS) ×2 IMPLANT
PENCIL SMOKE EVACUATOR (MISCELLANEOUS) ×2 IMPLANT
SUT ETHILON 2 0 FS 18 (SUTURE) ×8 IMPLANT
SUT MNCRL AB 4-0 PS2 18 (SUTURE) ×8 IMPLANT
SUT SILK 2 0 SH (SUTURE) ×6 IMPLANT
SUT VIC AB 3-0 SH 18 (SUTURE) ×4 IMPLANT
SYR CONTROL 10ML LL (SYRINGE) ×2 IMPLANT
TOWEL OR 17X24 6PK STRL BLUE (TOWEL DISPOSABLE) ×2 IMPLANT
TOWEL OR 17X26 10 PK STRL BLUE (TOWEL DISPOSABLE) ×2 IMPLANT

## 2018-08-21 NOTE — Anesthesia Procedure Notes (Signed)
Anesthesia Regional Block: Pectoralis block   Pre-Anesthetic Checklist: ,, timeout performed, Correct Patient, Correct Site, Correct Laterality, Correct Procedure, Correct Position, site marked, Risks and benefits discussed,  Surgical consent,  Pre-op evaluation,  At surgeon's request and post-op pain management  Laterality: Left  Prep: chloraprep       Needles:  Injection technique: Single-shot  Needle Type: Echogenic Stimulator Needle     Needle Length: 9cm  Needle Gauge: 21     Additional Needles:   Procedures:,,,, ultrasound used (permanent image in chart),,,,  Narrative:  Start time: 08/21/2018 10:38 AM End time: 08/21/2018 10:42 AM Injection made incrementally with aspirations every 5 mL.  Performed by: Personally  Anesthesiologist: Effie Berkshire, MD  Additional Notes: Patient tolerated the procedure well. Local anesthetic introduced in an incremental fashion under minimal resistance after negative aspirations. No paresthesias were elicited. After completion of the procedure, no acute issues were identified and patient continued to be monitored by RN.

## 2018-08-21 NOTE — Anesthesia Procedure Notes (Signed)
Procedure Name: Intubation Date/Time: 08/21/2018 12:13 PM Performed by: Eligha Bridegroom, CRNA Pre-anesthesia Checklist: Patient identified, Emergency Drugs available, Suction available, Patient being monitored and Timeout performed Patient Re-evaluated:Patient Re-evaluated prior to induction Oxygen Delivery Method: Circle system utilized Preoxygenation: Pre-oxygenation with 100% oxygen Induction Type: IV induction Laryngoscope Size: 4 Grade View: Grade I Tube type: Oral Tube size: 7.0 mm Number of attempts: 1 Airway Equipment and Method: Stylet Placement Confirmation: ETT inserted through vocal cords under direct vision,  positive ETCO2 and breath sounds checked- equal and bilateral Secured at: 21 cm Tube secured with: Tape Dental Injury: Teeth and Oropharynx as per pre-operative assessment

## 2018-08-21 NOTE — Anesthesia Preprocedure Evaluation (Addendum)
Anesthesia Evaluation  Patient identified by MRN, date of birth, ID band Patient awake    Reviewed: Allergy & Precautions, NPO status , Patient's Chart, lab work & pertinent test results  History of Anesthesia Complications (+) PONV  Airway Mallampati: I  TM Distance: >3 FB Neck ROM: Full    Dental  (+) Missing,    Pulmonary former smoker,    breath sounds clear to auscultation       Cardiovascular hypertension, Pt. on medications  Rhythm:Regular Rate:Normal     Neuro/Psych Depression    GI/Hepatic negative GI ROS,   Endo/Other  Hypothyroidism   Renal/GU      Musculoskeletal  (+) Arthritis , Rheumatoid disorders,    Abdominal (+) + obese,   Peds  Hematology   Anesthesia Other Findings   Reproductive/Obstetrics                            Lab Results  Component Value Date   WBC 7.4 08/13/2018   HGB 12.0 08/13/2018   HCT 40.5 08/13/2018   MCV 88.2 08/13/2018   PLT 412 (H) 08/13/2018   Lab Results  Component Value Date   CREATININE 0.71 08/13/2018   BUN 11 08/13/2018   NA 139 08/13/2018   K 3.8 08/13/2018   CL 107 08/13/2018   CO2 25 08/13/2018   Lab Results  Component Value Date   INR 1.07 10/15/2014   INR 1.0 03/24/2008   EKG: sinus rhythm, PAC's noted.   Anesthesia Physical Anesthesia Plan  ASA: II  Anesthesia Plan: General   Post-op Pain Management: GA combined w/ Regional for post-op pain   Induction: Intravenous  PONV Risk Score and Plan: 4 or greater and Ondansetron, Dexamethasone, Scopolamine patch - Pre-op and Midazolam  Airway Management Planned: Oral ETT  Additional Equipment: None  Intra-op Plan:   Post-operative Plan: Extubation in OR  Informed Consent: I have reviewed the patients History and Physical, chart, labs and discussed the procedure including the risks, benefits and alternatives for the proposed anesthesia with the patient or authorized  representative who has indicated his/her understanding and acceptance.   Dental advisory given  Plan Discussed with: CRNA  Anesthesia Plan Comments:       Anesthesia Quick Evaluation

## 2018-08-21 NOTE — Discharge Instructions (Signed)
[facility] °[phone] ° °MASTECTOMY: POST OP INSTRUCTIONS ° °Always review your discharge instruction sheet given to you by the facility where your surgery was performed. °IF YOU HAVE DISABILITY OR FAMILY LEAVE FORMS, YOU MUST BRING THEM TO THE OFFICE FOR PROCESSING.   °DO NOT GIVE THEM TO YOUR DOCTOR. °A prescription for pain medication may be given to you upon discharge.  Take your pain medication as prescribed, if needed.  If narcotic pain medicine is not needed, then you may take acetaminophen (Tylenol) or ibuprofen (Advil) as needed. °1. Take your usually prescribed medications unless otherwise directed. °2. If you need a refill on your pain medication, please contact your pharmacy.  They will contact our office to request authorization.  Prescriptions will not be filled after 5pm or on week-ends. °3. You should follow a light diet the first few days after arrival home, such as soup and crackers, etc.  Resume your normal diet the day after surgery. °4. Most patients will experience some swelling and bruising on the chest and underarm.  Ice packs will help.  Swelling and bruising can take several days to resolve.  °5. It is common to experience some constipation if taking pain medication after surgery.  Increasing fluid intake and taking a stool softener (such as Colace) will usually help or prevent this problem from occurring.  A mild laxative (Milk of Magnesia or Miralax) should be taken according to package instructions if there are no bowel movements after 48 hours. °6. Unless discharge instructions indicate otherwise, leave your bandage dry and in place until your next appointment in 3-5 days.  You may take a limited sponge bath.  No tube baths or showers until the drains are removed.  You may have steri-strips (small skin tapes) in place directly over the incision.  These strips should be left on the skin for 7-10 days.  If your surgeon used skin glue on the incision, you may  shower in 24 hours.  The glue will flake off over the next 2-3 weeks.  Any sutures or staples will be removed at the office during your follow-up visit. °7. DRAINS:  If you have drains in place, it is important to keep a list of the amount of drainage produced each day in your drains.  Before leaving the hospital, you should be instructed on drain care.  Call our office if you have any questions about your drains. °8. ACTIVITIES:  You may resume regular (light) daily activities beginning the next day--such as daily self-care, walking, climbing stairs--gradually increasing activities as tolerated.  You may have sexual intercourse when it is comfortable.  Refrain from any heavy lifting or straining until approved by your doctor. °a. You may drive when you are no longer taking prescription pain medication, you can comfortably wear a seatbelt, and you can safely maneuver your car and apply brakes. °b. RETURN TO WORK:  __________________________________________________________ °9. You should see your doctor in the office for a follow-up appointment approximately 3-5 days after your surgery.  Your doctor’s nurse will typically make your follow-up appointment when she calls you with your pathology report.  Expect your pathology report 2-3 business days after your surgery.  You may call to check if you do not hear from us after three days.   °10. OTHER INSTRUCTIONS: ______________________________________________________________________________________________ ____________________________________________________________________________________________ °WHEN TO CALL YOUR DOCTOR: °1. Fever over 101.0 °2. Nausea and/or vomiting °3. Extreme swelling or bruising °4. Continued bleeding from incision. °5. Increased pain, redness, or drainage from the incision. °  The clinic staff is available to answer your questions during regular business hours.  Please dont hesitate to call and ask to speak to one of the nurses for clinical  concerns.  If you have a medical emergency, go to the nearest emergency room or call 911.  A surgeon from [facility] is always on call at the hospital. [address] [place], [place], [place]  [address] ? [address], [place], [place]   [address] [phone] ? [phone] ? FAX [phone] Web site: [place] [facility] drains are used to remove extra fluid that normally builds up in a surgical wound after surgery. A surgical drain helps to heal a surgical wound. Different kinds of surgical drains include:  Active drains. These drains use suction to pull drainage away from the surgical wound. Drainage flows through a tube to a container outside of the body. It is important to keep the bulb or the drainage container flat (compressed) at all times, except while you empty it. Flattening the bulb or container creates suction. The two most common types of active drains are bulb drains and Hemovac drains.  Passive drains. These drains allow fluid to drain naturally, by gravity. Drainage flows through a tube to a bandage (dressing) or a container outside of the body. Passive drains do not need to be emptied. The most common type of passive drain is the Penrose drain.  A drain is placed during surgery. Immediately after surgery, drainage is usually bright red and a little thicker than water. The drainage may gradually turn yellow or pink and become thinner. It is likely that your health care provider will remove the drain when the drainage stops or when the amount decreases to 1-2 Tbsp (15-30 mL) during a 24-hour period. How to care for your surgical drain  Keep the skin around the drain dry and covered with a dressing at all times.  Check your drain area every day for signs of infection. Check for: ? More redness, swelling, or pain. ? Pus or a bad smell. ? Cloudy drainage. Follow instructions from your health care provider about how to take care of your  drain and how to change your dressing. Change your dressing at least one time every day. Change it more often if needed to keep the dressing dry. Make sure you: 1. Gather your supplies, including: ? Tape. ? Germ-free cleaning solution (sterile saline). ? Split gauze drain sponge: 4 x 4 inches (10 x 10 cm). ? Gauze square: 4 x 4 inches (10 x 10 cm). 2. Wash your hands with soap and water before you change your dressing. If soap and water are not available, use hand sanitizer. 3. Remove the old dressing. Avoid using scissors to do that. 4. Use sterile saline to clean your skin around the drain. 5. Place the tube through the slit in a drain sponge. Place the drain sponge so that it covers your wound. 6. Place the gauze square or another drain sponge on top of the drain sponge that is on the wound. Make sure the tube is between those layers. 7. Tape the dressing to your skin. 8. If you have an active bulb or Hemovac drain, tape the drainage tube to your skin 1-2 inches (2.5-5 cm) below the place where the tube enters your body. Taping keeps the tube from pulling on any stitches (sutures) that you have. 9. Wash your hands with soap and water. 10. Write down the color of your drainage  and how often you change your dressing. ° °How to empty your active bulb or Hemovac drain °1. Make sure that you have a measuring cup that you can empty your drainage into. °2. Wash your hands with soap and water. If soap and water are not available, use hand sanitizer. °3. Gently move your fingers down the tube while squeezing very lightly. This is called stripping the tube. This clears any drainage, clots, or tissue from the tube. °? Do not pull on the tube. °? You may need to strip the tube several times every day to keep the tube clear. °4. Open the bulb cap or the drain plug. Do not touch the inside of the cap or the bottom of the plug. °5. Empty all of the drainage into the measuring cup. °6. Compress the bulb or the  container and replace the cap or the plug. To compress the bulb or the container, squeeze it firmly in the middle while you close the cap or plug the container. °7. Write down the amount of drainage that you have in each 24-hour period. If you have less than 2 Tbsp (30 mL) of drainage during 24 hours, contact your health care provider. °8. Flush the drainage down the toilet. °9. Wash your hands with soap and water. °Contact a health care provider if: °· You have more redness, swelling, or pain around your drain area. °· The amount of drainage that you have is increasing instead of decreasing. °· You have pus or a bad smell coming from your drain area. °· You have a fever. °· You have drainage that is cloudy. °· There is a sudden stop or a sudden decrease in the amount of drainage that you have. °· Your tube falls out. °· Your active drain does not stay compressed after you empty it. °This information is not intended to replace advice given to you by your health care provider. Make sure you discuss any questions you have with your health care provider. °Document Released: 09/02/2000 Document Revised: 02/11/2016 Document Reviewed: 03/25/2015 °Elsevier Interactive Patient Education © 2018 [employer] ° °

## 2018-08-21 NOTE — Anesthesia Postprocedure Evaluation (Signed)
Anesthesia Post Note  Patient: CAMRON ESSMAN  Procedure(s) Performed: BILATERAL SIMPLE MASTECTOMIES WITH LEFT AXILLARY RADIOACTIVE SEED GUIDED LYMPH NODE EXCISION AND LEFT AXILLARY SENTINEL LYMPH NODE BIOPSY (Bilateral Breast)     Patient location during evaluation: PACU Anesthesia Type: General and Regional Level of consciousness: awake and alert Pain management: pain level controlled Vital Signs Assessment: post-procedure vital signs reviewed and stable Respiratory status: spontaneous breathing, nonlabored ventilation, respiratory function stable and patient connected to nasal cannula oxygen Cardiovascular status: blood pressure returned to baseline and stable Postop Assessment: no apparent nausea or vomiting Anesthetic complications: no    Last Vitals:  Vitals:   08/21/18 1943 08/21/18 1954  BP: 122/78 128/78  Pulse: 87 83  Resp: 16 17  Temp:  36.9 C  SpO2: 93% 98%    Last Pain:  Vitals:   08/21/18 1954  TempSrc: Oral  PainSc:                  Effie Berkshire

## 2018-08-21 NOTE — Transfer of Care (Signed)
Immediate Anesthesia Transfer of Care Note  Patient: Paula Jacobs  Procedure(s) Performed: BILATERAL SIMPLE MASTECTOMIES WITH LEFT AXILLARY RADIOACTIVE SEED GUIDED LYMPH NODE EXCISION AND LEFT AXILLARY SENTINEL LYMPH NODE BIOPSY (Bilateral Breast)  Patient Location: PACU  Anesthesia Type:General  Level of Consciousness: awake, alert  and oriented  Airway & Oxygen Therapy: Patient Spontanous Breathing and Patient connected to nasal cannula oxygen  Post-op Assessment: Report given to RN and Post -op Vital signs reviewed and stable  Post vital signs: Reviewed and stable  Last Vitals:  Vitals Value Taken Time  BP 160/92 08/21/2018  2:38 PM  Temp    Pulse 93 08/21/2018  2:42 PM  Resp 23 08/21/2018  2:42 PM  SpO2 99 % 08/21/2018  2:42 PM  Vitals shown include unvalidated device data.  Last Pain:  Vitals:   08/21/18 0956  TempSrc: Oral  PainSc: 0-No pain      Patients Stated Pain Goal: 3 (73/53/29 9242)  Complications: No apparent anesthesia complications

## 2018-08-21 NOTE — Interval H&P Note (Signed)
History and Physical Interval Note:  08/21/2018 11:21 AM  Paula Jacobs  has presented today for surgery, with the diagnosis of RIGHT BREAST CANCER  The various methods of treatment have been discussed with the patient and family. After consideration of risks, benefits and other options for treatment, the patient has consented to  Procedure(s): BILATERAL SIMPLE MASTECTOMIES WITH LEFT AXILLARY RADIOACTIVE SEED GUIDED LYMPH NODE EXCISION AND LEFT AXILLARY SENTINEL LYMPH NODE BIOPSY (Bilateral) as a surgical intervention .  The patient's history has been reviewed, patient examined, no change in status, stable for surgery.  I have reviewed the patient's chart and labs.  Questions were answered to the patient's satisfaction.     [place]

## 2018-08-21 NOTE — Anesthesia Procedure Notes (Signed)
Anesthesia Regional Block: Pectoralis block   Pre-Anesthetic Checklist: ,, timeout performed, Correct Patient, Correct Site, Correct Laterality, Correct Procedure, Correct Position, site marked, Risks and benefits discussed,  Surgical consent,  Pre-op evaluation,  At surgeon's request and post-op pain management  Laterality: Right  Prep: chloraprep       Needles:  Injection technique: Single-shot  Needle Type: Echogenic Stimulator Needle     Needle Length: 9cm  Needle Gauge: 21     Additional Needles:   Procedures:,,,, ultrasound used (permanent image in chart),,,,  Narrative:  Start time: 08/21/2018 10:45 AM End time: 08/21/2018 10:50 AM Injection made incrementally with aspirations every 5 mL.  Performed by: Personally  Anesthesiologist: Effie Berkshire, MD  Additional Notes: Patient tolerated the procedure well. Local anesthetic introduced in an incremental fashion under minimal resistance after negative aspirations. No paresthesias were elicited. After completion of the procedure, no acute issues were identified and patient continued to be monitored by RN.

## 2018-08-21 NOTE — Progress Notes (Signed)
Patient received from PACU.Alert and oriented x4. VS stable, dressing to chest clean,dry and intact. JP drains in place. Denies pain at this time. Oriented to room and call bell.

## 2018-08-21 NOTE — Plan of Care (Signed)
  Problem: Clinical Measurements: Goal: Postoperative complications will be avoided or minimized Outcome: Progressing   Problem: Skin Integrity: Goal: Demonstration of wound healing without infection will improve Outcome: Progressing   

## 2018-08-21 NOTE — Op Note (Signed)
Preoperative diagnosis: Stage II left breast cancer  Postoperative diagnosis: Same  Procedure: Left simple mastectomy with targeted left axillary lymph node dissection using seed and left axillary sentinel lymph node mapping with methylene blue dye and prophylactic right simple mastectomy  Surgeon: Erroll Luna, MD  Anesthesia: General with bilateral pectoral blocks  EBL: 40 cc  Specimen: #1 right breast #2 left breast #3 left axillary lymph node with radioactive seed verified by Faxitron #4 6 left axillary sentinel nodes hot and blue  Drains: 2 #19 round drains to each side to bulb suction  IV fluids: Per anesthesia record  Indications for procedure: The patient presents for right prophylactic mastectomy and left simple mastectomy for stage II left breast cancer.  She had a positive lymph node which a clip was inserted into for removal.  It was unclear if she would require chemotherapy therefore targeted lymph node dissection was recommended and she opted for bilateral mastectomies.  Risk, benefits and other options discussed with the patient.Sentinel lymph node mapping and dissection has been discussed with the patient.  Risk of bleeding,  Infection,  Seroma formation,  Additional procedures,,  Shoulder weakness ,  Shoulder stiffness,  Nerve and blood vessel injury and reaction to the mapping dyes have been discussed.  Alternatives to surgery have been discussed with the patient.  The patient agrees to proceed.The procedure has been discussed with the patient.  Alternative therapies have been discussed with the patient.  Operative risks include bleeding,  Infection, arm swelling, flap necrosis, hematoma, organ injury,  Nerve injury,  Blood vessel injury,  DVT,  Pulmonary embolism,  Death,  And possible reoperation.  Medical management risks include worsening of present situation.  The success of the procedure is 50 -90 % at treating patients symptoms.  The patient understands and agrees to  proceed.  Description of procedure: The patient was met in the holding area.  She underwent seed placement by radiology yesterday for lymph node targeted dissection.  She then underwent bilateral pectoral blocks by anesthesia.  She then underwent injection with technetium sulfur colloid into the left breast for mapping.  Questions were answered.  She was taken back to the operating room placed supine.  After induction of general anesthesia, both breasts were prepped and draped in a sterile fashion timeout was done.  The right side was done first.  Proper patient procedure were verified and she received preoperative antibiotics.  Curvilinear incision was made above and below the nipple areolar complex in the right.  Superior skin flap taken the clavicle and inferior skin flap taken down to the inferior mammary fold.  This was taken medially to sternum.  The breast was then dissected off the chest wall in a medial to lateral fashion until lateral attachments were encountered and divided.  This was all the way lateral at the level of the pectoralis minor and latissimus muscle.  The long thoracic nerve, thoracodorsal trunk and x-ray vein were preserved.  Hemostasis was achieved.  Through 2 separate stab incisions, 19 round drains were placed and secured to skin with 2-0 nylon.  Irrigation was used.  Hemostasis achieved with cautery.  The wound was closed with 3-0 Vicryl and 4-0 Monocryl.  5 cc of methylene blue were injected under the left nipple for mapping.  This was massaged for 5 minutes.  Curvilinear incision was made above and below the nipple areolar complex.  Superior skin flap taken to the clavicle with inferior skin flap taken to the inframammary fold.  This was  taken  to the midline sternum.  The breast was then dissected off in a medial to lateral fashion.  Once her rate we reached the axilla all suction was turned off.  Neoprobe was used with the iodine settings in the lymph node contained the seed was  identified and dissected out in the level 1 lymph node basin.  Faxitron image revealed the seating clip to be in the specimen as well as 2 clips were placed around the lymph node for hemostasis.  This was sent to pathology.  I then went ahead and dissected the remainder the breast off the chest wall and divided the lateral attachments and passed off the field.  The neoprobe was switched to technetium setting.  I encountered 6 hot and blue sentinel nodes and remove these for a total of 7 nodes removed from the left axillary basin.  These are level 1 nodes.  There is no level 2 adenopathy or level 3 adenopathy that I can palpate.  Palpation of the remainder of the level 1 basin revealed no other abnormality.  The long thoracic nerve, thoracodorsal trunk and axillary vein were all preserved.  Hemostasis achieved.  2 round drains placed through 2 separate stab incisions to the inferior flap and secured with 2-0 nylon.  After hemostasis was achieved the wound was closed after irrigating.  3-0 Vicryl and 4-0 Monocryl was used for closure.  Dermabond applied.  Breast binder placed.  Bulb was placed to bulb suction.  All final counts were found to be correct.  The patient was awoke extubated taken to recovery in satisfactory condition.    

## 2018-08-22 ENCOUNTER — Encounter (HOSPITAL_COMMUNITY): Payer: Self-pay | Admitting: Surgery

## 2018-08-22 ENCOUNTER — Other Ambulatory Visit: Payer: Self-pay

## 2018-08-22 DIAGNOSIS — Z79899 Other long term (current) drug therapy: Secondary | ICD-10-CM | POA: Diagnosis not present

## 2018-08-22 DIAGNOSIS — C773 Secondary and unspecified malignant neoplasm of axilla and upper limb lymph nodes: Secondary | ICD-10-CM | POA: Diagnosis present

## 2018-08-22 DIAGNOSIS — C50912 Malignant neoplasm of unspecified site of left female breast: Secondary | ICD-10-CM | POA: Diagnosis present

## 2018-08-22 DIAGNOSIS — E669 Obesity, unspecified: Secondary | ICD-10-CM | POA: Diagnosis present

## 2018-08-22 DIAGNOSIS — Z87891 Personal history of nicotine dependence: Secondary | ICD-10-CM | POA: Diagnosis not present

## 2018-08-22 DIAGNOSIS — E039 Hypothyroidism, unspecified: Secondary | ICD-10-CM | POA: Diagnosis present

## 2018-08-22 DIAGNOSIS — Z6836 Body mass index (BMI) 36.0-36.9, adult: Secondary | ICD-10-CM | POA: Diagnosis not present

## 2018-08-22 DIAGNOSIS — Z91040 Latex allergy status: Secondary | ICD-10-CM | POA: Diagnosis not present

## 2018-08-22 DIAGNOSIS — I10 Essential (primary) hypertension: Secondary | ICD-10-CM | POA: Diagnosis present

## 2018-08-22 DIAGNOSIS — Z885 Allergy status to narcotic agent status: Secondary | ICD-10-CM | POA: Diagnosis not present

## 2018-08-22 LAB — COMPREHENSIVE METABOLIC PANEL
ALT: 12 U/L (ref 0–44)
AST: 18 U/L (ref 15–41)
Albumin: 2.8 g/dL — ABNORMAL LOW (ref 3.5–5.0)
Alkaline Phosphatase: 35 U/L — ABNORMAL LOW (ref 38–126)
Anion gap: 10 (ref 5–15)
BUN: 16 mg/dL (ref 6–20)
CO2: 25 mmol/L (ref 22–32)
Calcium: 8.1 mg/dL — ABNORMAL LOW (ref 8.9–10.3)
Chloride: 102 mmol/L (ref 98–111)
Creatinine, Ser: 1.03 mg/dL — ABNORMAL HIGH (ref 0.44–1.00)
GFR calc Af Amer: >60 mL/min (ref >60)
GFR calc non Af Amer: >60 mL/min (ref >60)
GLUCOSE: 127 mg/dL — AB (ref 70–99)
Potassium: 4.5 mmol/L (ref 3.5–5.1)
SODIUM: 137 mmol/L (ref 135–145)
Total Bilirubin: 0.3 mg/dL (ref 0.3–1.2)
Total Protein: 5.4 g/dL — ABNORMAL LOW (ref 6.5–8.1)

## 2018-08-22 LAB — CBC
HCT: 30.5 % — ABNORMAL LOW (ref 36.0–46.0)
Hemoglobin: 9.2 g/dL — ABNORMAL LOW (ref 12.0–15.0)
MCH: 26.4 pg (ref 26.0–34.0)
MCHC: 30.2 g/dL (ref 30.0–36.0)
MCV: 87.6 fL (ref 80.0–100.0)
PLATELETS: 378 10*3/uL (ref 150–400)
RBC: 3.48 MIL/uL — ABNORMAL LOW (ref 3.87–5.11)
RDW: 13.8 % (ref 11.5–15.5)
WBC: 11.8 10*3/uL — ABNORMAL HIGH (ref 4.0–10.5)
nRBC: 0 % (ref 0.0–0.2)

## 2018-08-22 NOTE — Addendum Note (Signed)
Addendum  created 08/22/18 1506 by Effie Berkshire, MD   Intraprocedure Staff edited

## 2018-08-22 NOTE — Progress Notes (Signed)
1 Day Post-Op   Subjective/Chief Complaint: Doing ok sore  Just sold her house and does not have a place to stay until tomorrow    Objective: Vital signs in last 24 hours: Temp:  [97.5 F (36.4 C)-98.4 F (36.9 C)] 98.2 F (36.8 C) (12/04 0559) Pulse Rate:  [65-107] 65 (12/04 0559) Resp:  [12-23] 16 (12/04 0559) BP: (122-160)/(62-92) 133/70 (12/04 0559) SpO2:  [92 %-100 %] 97 % (12/04 0559) Weight:  [89.7 kg] 89.7 kg (12/03 0956) Last BM Date: 08/21/18  Intake/Output from previous day: 12/03 0701 - 12/04 0700 In: 2627.7 [P.O.:390; I.V.:1987.7; IV Piggyback:100] Out: 430 [Drains:410; Blood:20] Intake/Output this shift: No intake/output data recorded.  Incision/Wound:flaps intact  Bloody drainge from all four bulbs soft no large hematoma   Lab Results:  Recent Labs    08/21/18 2120 08/22/18 0319  WBC 15.5* 11.8*  HGB 10.3* 9.2*  HCT 34.5* 30.5*  PLT 419* 378   BMET Recent Labs    08/21/18 2120 08/22/18 0319  NA  --  137  K  --  4.5  CL  --  102  CO2  --  25  GLUCOSE  --  127*  BUN  --  16  CREATININE 0.92 1.03*  CALCIUM  --  8.1*   PT/INR No results for input(s): LABPROT, INR in the last 72 hours. ABG No results for input(s): PHART, HCO3 in the last 72 hours.  Invalid input(s): PCO2, PO2  Studies/Results: Nm Sentinel Node Inj-no Rpt (breast)  Result Date: 08/21/2018 Sulfur colloid was injected by the nuclear medicine technologist for melanoma sentinel node.   Mm Breast Surgical Specimen  Result Date: 08/21/2018 CLINICAL DATA:  Status post surgical excision of a left axillary lymph node following radioactive seed localization. EXAM: SPECIMEN RADIOGRAPH OF THE LEFT AXILLA COMPARISON:  Previous exam(s). FINDINGS: Status post excision of the left axilla. The radioactive seed and biopsy marker clip are present, completely intact, and were marked for pathology. IMPRESSION: Specimen radiograph of the left axilla. Electronically Signed   By: Lajean Manes  M.D.   On: 08/21/2018 13:38   [place] Radioactive Seed Loc  Result Date: 08/20/2018 CLINICAL DATA:  The patient presents for radioactive seed localization of LEFT axillary lymph node. Patient scheduled for bilateral mastectomy. EXAM: ULTRASOUND GUIDED RADIOACTIVE SEED LOCALIZATION OF THE LEFT AXILLA COMPARISON:  Previous exam(s). FINDINGS: Patient presents for radioactive seed localization prior to bilateral mastectomy and targeted LEFT axillary lymph node dissection. I met with the patient and we discussed the procedure of seed localization including benefits and alternatives. We discussed the high likelihood of a successful procedure. We discussed the risks of the procedure including infection, bleeding, tissue injury and further surgery. We discussed the low dose of radioactivity involved in the procedure. Informed, written consent was given. The usual time-out protocol was performed immediately prior to the procedure. Using ultrasound guidance, sterile technique, 1% lidocaine and an I-125 radioactive seed, the lymph node containing tissue marker clip in the LEFT axilla was localized using a LATERAL to MEDIAL approach. The follow-up mammogram images confirm the seed in the expected location and were marked for Dr. Brantley Stage. Follow-up survey of the patient confirms presence of the radioactive seed. Order number of I-125 seed:  [id number]. Total activity:  4.709 millicuries reference Date: 07/30/2018 The patient tolerated the procedure well and was released from the [facility]. She was given instructions regarding seed removal. IMPRESSION: Radioactive seed localization of the LEFT axilla. No apparent complications. Electronically Signed   By: Benjamine Mola  Owens Shark M.D.   On: 08/20/2018 14:24   Mm Clip Placement Left  Result Date: 08/20/2018 CLINICAL DATA:  Status post ultrasound-guided placement of LEFT axillary radioactive seed. EXAM: DIAGNOSTIC LEFT MAMMOGRAM POST ULTRASOUND-GUIDED RADIOACTIVE SEED PLACEMENT  COMPARISON:  Previous exam(s). FINDINGS: Mammographic images were obtained following ultrasound-guided radioactive seed placement. These demonstrate the radioactive seed adjacent to tissue marker clip placed the time of ultrasound-guided core biopsy. IMPRESSION: Appropriate location of the radioactive seed. Final Assessment: Post Procedure Mammograms for Seed Placement Electronically Signed   By: Nolon Nations M.D.   On: 08/20/2018 14:25    Anti-infectives: Anti-infectives (From admission, onward)   Start     Dose/Rate Route Frequency Ordered Stop   08/21/18 2130  ceFAZolin (ANCEF) IVPB 2g/100 mL premix     2 g 200 mL/hr over 30 Minutes Intravenous Every 8 hours 08/21/18 1629 08/21/18 2208   08/20/18 1415  ceFAZolin (ANCEF) IVPB 2g/100 mL premix     2 g 200 mL/hr over 30 Minutes Intravenous To [facility] 08/20/18 1407 08/21/18 1200      Assessment/Plan: s/p Procedure(s): BILATERAL SIMPLE MASTECTOMIES WITH LEFT AXILLARY RADIOACTIVE SEED GUIDED LYMPH NODE EXCISION AND LEFT AXILLARY SENTINEL LYMPH NODE BIOPSY (Bilateral) Ambulate  Watch output given drop in H/H  Discharge tomorrow   LOS: 0 days    Marcello Moores A Marisabel Macpherson 08/22/2018

## 2018-08-22 NOTE — Plan of Care (Signed)
  Problem: Activity: Goal: Ability to maintain or regain function will improve Outcome: Progressing   Problem: Clinical Measurements: Goal: Postoperative complications will be avoided or minimized Outcome: Progressing   Problem: Self-Concept: Goal: Ability to verbalize positive feelings about self will improve Outcome: Progressing   Problem: Pain Management: Goal: Expressions of feelings of enhanced comfort will increase Outcome: Progressing   Problem: Skin Integrity: Goal: Demonstration of wound healing without infection will improve Outcome: Progressing   Problem: Clinical Measurements: Goal: Will remain free from infection Outcome: Progressing   Problem: Activity: Goal: Risk for activity intolerance will decrease Outcome: Progressing   Problem: Nutrition: Goal: Adequate nutrition will be maintained Outcome: Progressing   Problem: Coping: Goal: Level of anxiety will decrease Outcome: Progressing   Problem: Pain Managment: Goal: General experience of comfort will improve Outcome: Progressing

## 2018-08-23 MED ORDER — ONDANSETRON 4 MG PO TBDP: 4.0000 mg | ORAL_TABLET | Freq: Four times a day (QID) | ORAL | 0 refills | Status: DC | PRN

## 2018-08-23 MED ORDER — METHOCARBAMOL 500 MG PO TABS: 500.0000 mg | ORAL_TABLET | Freq: Four times a day (QID) | ORAL | 0 refills | Status: DC | PRN

## 2018-08-23 MED ORDER — IBUPROFEN 800 MG PO TABS: 800.0000 mg | ORAL_TABLET | Freq: Three times a day (TID) | ORAL | 0 refills | Status: DC | PRN

## 2018-08-23 MED ORDER — OXYCODONE HCL 5 MG PO TABS: 5.0000 mg | ORAL_TABLET | ORAL | 0 refills | Status: DC | PRN

## 2018-08-23 NOTE — Discharge Summary (Signed)
Physician Discharge Summary  Patient ID: Paula Jacobs MRN: [id number] DOB/AGE: 55-Oct-1962 55 y.o.  Admit date: 08/21/2018 Discharge date: 08/23/2018  Admission Diagnoses: Stage II left breast cancer  Discharge Diagnoses:  Active Problems:   Breast cancer, stage 2, left (HCC)   Breast cancer, left [facility])   Discharged Condition: good  Hospital Course: Patient underwent bilateral simple mastectomy with left axillary targeted node dissection.  She did well postoperatively.  She had some swelling on both sides which was minimal.  She had no evidence of significant hematoma.  She was able to walk the halls without difficulty.  Her diet was advanced slowly.  She remained hemodynamically stable and was discharged home on postoperative day #2.  She had no place to go home postoperative day 1 due to her house being sold and was in the process of finding residence which she had on postoperative day 2.      Treatments: surgery: Bilateral simple mastectomy with left targeted axillary lymph node dissection  Discharge Exam: Blood pressure (!) 102/56, pulse 72, temperature 98.4 F (36.9 C), temperature source Oral, resp. rate 16, height 5\' 2"  (1.575 m), weight 89.7 kg, SpO2 98 %. General appearance: alert and cooperative Resp: clear to auscultation bilaterally Cardio: regular rate and rhythm, S1, S2 normal, no murmur, click, rub or gallop Incision/Wound: Flaps viable and intact.  No significant hematoma.  Drains are serosanguineous.  No evidence of hematoma bilaterally.  Disposition: Discharge disposition: 01-Home or Self Care       Discharge Instructions    Diet - low sodium heart healthy   Complete by:  As directed    Increase activity slowly   Complete by:  As directed      Allergies as of 08/23/2018      Reactions   Hydrocodone-acetaminophen Itching   Latex Itching   Tramadol Hcl Itching, Nausea Only      Medication List    TAKE these medications   acetaminophen 500 MG  tablet Commonly known as:  TYLENOL Take 1,000 mg by mouth every 6 (six) hours as needed for moderate pain.   benazepril-hydrochlorthiazide 10-12.5 MG tablet Commonly known as:  LOTENSIN HCT TAKE 1 TABLET BY MOUTH DAILY   ibuprofen 800 MG tablet Commonly known as:  ADVIL,MOTRIN Take 1 tablet (800 mg total) by mouth every 8 (eight) hours as needed.   levothyroxine 175 MCG tablet Commonly known as:  SYNTHROID, LEVOTHROID TAKE 1 TABLET BY MOUTH EVERY MORNING What changed:  when to take this   methocarbamol 500 MG tablet Commonly known as:  ROBAXIN Take 1 tablet (500 mg total) by mouth every 6 (six) hours as needed for muscle spasms.   ondansetron 4 MG disintegrating tablet Commonly known as:  ZOFRAN-ODT Take 1 tablet (4 mg total) by mouth every 6 (six) hours as needed for nausea.   oxyCODONE 5 MG immediate release tablet Commonly known as:  Oxy IR/ROXICODONE Take 1-2 tablets (5-10 mg total) by mouth every 4 (four) hours as needed for moderate pain.        Signed: Joyice Faster Waymon Laser 08/23/2018, 5:33 AM

## 2018-08-23 NOTE — Progress Notes (Signed)
Pt  emptying and states that she can care for drains without difficulty..   Supplies sent home with pt.Marland Kitchen

## 2018-08-24 ENCOUNTER — Telehealth: Payer: Self-pay | Admitting: *Deleted

## 2018-08-24 NOTE — Telephone Encounter (Signed)
Received order for Mammaprint testing per Dr. Lindi Adie. Requisition faxed to pathology and [facility]. Received by Varney Biles.

## 2018-09-03 ENCOUNTER — Inpatient Hospital Stay: Payer: Managed Care, Other (non HMO) | Attending: Hematology and Oncology | Admitting: Hematology and Oncology

## 2018-09-03 DIAGNOSIS — C50412 Malignant neoplasm of upper-outer quadrant of left female breast: Secondary | ICD-10-CM | POA: Insufficient documentation

## 2018-09-03 DIAGNOSIS — Z170 Estrogen receptor positive status [ER+]: Secondary | ICD-10-CM | POA: Diagnosis not present

## 2018-09-03 DIAGNOSIS — Z79899 Other long term (current) drug therapy: Secondary | ICD-10-CM

## 2018-09-03 DIAGNOSIS — Z9013 Acquired absence of bilateral breasts and nipples: Secondary | ICD-10-CM | POA: Diagnosis not present

## 2018-09-03 NOTE — Progress Notes (Signed)
Patient Care Team: [name], DO as PCP - General (Family Medicine) Hurley Cisco, MD as Consulting Physician (Rheumatology) [name], MD as Consulting Physician (Orthopedic Surgery) Erroll Luna, MD as Consulting Physician (General Surgery) Nicholas Lose, MD as Consulting Physician (Hematology and Oncology) Kyung Rudd, MD as Consulting Physician (Radiation Oncology)  DIAGNOSIS:  Encounter Diagnosis  Name Primary?  . Malignant neoplasm of upper-outer quadrant of left breast in female, estrogen receptor positive ([place])     SUMMARY OF ONCOLOGIC HISTORY:   Malignant neoplasm of upper-outer quadrant of left breast in female, estrogen receptor positive ([place])   07/13/2018 Initial Diagnosis     Palpable left breast mass, 2 adjacent masses at 2 o'clock position 2 cm biopsy IDC grade 2-3, Ig DCIS, ER 95%, PR 90%, Ki-67 30%, HER-2 -1+; 2:30 position intramammary lymph node 1.3 cm biopsy positive; left axillary lymph node biopsy negative discordant, T2N1, stage IIa    07/25/2018 Cancer Staging    Staging form: Breast, AJCC 8th Edition - Clinical: Stage IIA (cT1c, cN1, cM0, G3, ER+, PR+, HER2-) - Signed by Nicholas Lose, MD on 07/25/2018    08/21/2018 Surgery    Bilateral mastectomies: Left mastectomy: IDC grade 3, 2.1 cm, high-grade DCIS, margins negative, negative for LV I, 3/9 lymph nodes positive, ER 95% positive, PR 90% positive, HER-2 -1+, Ki-67 30% T2N1A stage IIa right mastectomy: Benign    09/03/2018 Cancer Staging    Staging form: Breast, AJCC 8th Edition - Pathologic: Stage IIA (pT2, pN1a, cM0, G3, ER+, PR+, HER2-) - Signed by Nicholas Lose, MD on 09/03/2018     CHIEF COMPLIANT: Follow-up after bilateral mastectomies  INTERVAL HISTORY: Paula Jacobs is a 57 year old with above-mentioned history of left breast cancer underwent bilateral mastectomies and is here today to discuss the pathology report.  She is in a lot of pain and discomfort but otherwise doing  quite well.  REVIEW OF SYSTEMS:   Constitutional: Denies fevers, chills or abnormal weight loss Eyes: Denies blurriness of vision Ears, nose, mouth, throat, and face: Denies mucositis or sore throat Respiratory: Denies cough, dyspnea or wheezes Cardiovascular: Denies palpitation, chest discomfort Gastrointestinal:  Denies nausea, heartburn or change in bowel habits Skin: Denies abnormal skin rashes Lymphatics: Denies new lymphadenopathy or easy bruising Neurological:Denies numbness, tingling or new weaknesses Behavioral/Psych: Mood is stable, no new changes  Extremities: No lower extremity edema Breast: Bilateral mastectomies All other systems were reviewed with the patient and are negative.  I have reviewed the past medical history, past surgical history, social history and family history with the patient and they are unchanged from previous note.  ALLERGIES:  is allergic to hydrocodone-acetaminophen; latex; and tramadol hcl.  MEDICATIONS:  Current Outpatient Medications  Medication Sig Dispense Refill  . acetaminophen (TYLENOL) 500 MG tablet Take 1,000 mg by mouth every 6 (six) hours as needed for moderate pain.    . benazepril-hydrochlorthiazide (LOTENSIN HCT) 10-12.5 MG tablet TAKE 1 TABLET BY MOUTH DAILY 90 tablet 0  . ibuprofen (ADVIL,MOTRIN) 800 MG tablet Take 1 tablet (800 mg total) by mouth every 8 (eight) hours as needed. 30 tablet 0  . levothyroxine (SYNTHROID, LEVOTHROID) 175 MCG tablet TAKE 1 TABLET BY MOUTH EVERY MORNING (Patient taking differently: Take 175 mcg by mouth daily before breakfast. ) 30 tablet 5  . methocarbamol (ROBAXIN) 500 MG tablet Take 1 tablet (500 mg total) by mouth every 6 (six) hours as needed for muscle spasms. 10 tablet 0  . ondansetron (ZOFRAN-ODT) 4 MG disintegrating tablet Take 1 tablet (  4 mg total) by mouth every 6 (six) hours as needed for nausea. 20 tablet 0  . oxyCODONE (OXY IR/ROXICODONE) 5 MG immediate release tablet Take 1-2 tablets (5-10  mg total) by mouth every 4 (four) hours as needed for moderate pain. 15 tablet 0   No current facility-administered medications for this visit.     PHYSICAL EXAMINATION: ECOG PERFORMANCE STATUS: 1 - Symptomatic but completely ambulatory  Vitals:   09/03/18 1417  BP: 122/77  Pulse: (!) 102  Resp: 17  Temp: 98.2 F (36.8 C)  SpO2: 97%   Filed Weights   09/03/18 1417  Weight: 193 lb 9.6 oz (87.8 kg)    GENERAL:alert, no distress and comfortable SKIN: skin color, texture, turgor are normal, no rashes or significant lesions EYES: normal, Conjunctiva are pink and non-injected, sclera clear OROPHARYNX:no exudate, no erythema and lips, buccal mucosa, and tongue normal  NECK: supple, thyroid normal size, non-tender, without nodularity LYMPH:  no palpable lymphadenopathy in the cervical, axillary or inguinal LUNGS: clear to auscultation and percussion with normal breathing effort HEART: regular rate & rhythm and no murmurs and no lower extremity edema ABDOMEN:abdomen soft, non-tender and normal bowel sounds MUSCULOSKELETAL:no cyanosis of digits and no clubbing  NEURO: alert & oriented x 3 with fluent speech, no focal motor/sensory deficits EXTREMITIES: No lower extremity edema   LABORATORY DATA:  I have reviewed the data as listed CMP Latest Ref Rng & Units 08/22/2018 08/21/2018 08/13/2018  Glucose 70 - 99 mg/dL 127(H) - 94  BUN 6 - 20 mg/dL 16 - 11  Creatinine 0.44 - 1.00 mg/dL 1.03(H) 0.92 0.71  Sodium 135 - 145 mmol/L 137 - 139  Potassium 3.5 - 5.1 mmol/L 4.5 - 3.8  Chloride 98 - 111 mmol/L 102 - 107  CO2 22 - 32 mmol/L 25 - 25  Calcium 8.9 - 10.3 mg/dL 8.1(L) - 9.0  Total Protein 6.5 - 8.1 g/dL 5.4(L) - -  Total Bilirubin 0.3 - 1.2 mg/dL 0.3 - -  Alkaline Phos 38 - 126 U/L 35(L) - -  AST 15 - 41 U/L 18 - -  ALT 0 - 44 U/L 12 - -    Lab Results  Component Value Date   WBC 11.8 (H) 08/22/2018   HGB 9.2 (L) 08/22/2018   HCT 30.5 (L) 08/22/2018   MCV 87.6 08/22/2018    PLT 378 08/22/2018   NEUTROABS 4.5 07/25/2018    ASSESSMENT & PLAN:  Malignant neoplasm of upper-outer quadrant of left breast in female, estrogen receptor positive ([place]) 08/21/2018:Bilateral mastectomies: Left mastectomy: IDC grade 3, 2.1 cm, high-grade DCIS, margins negative, negative for LV I, 3/9 lymph nodes positive, ER 95% positive, PR 90% positive, HER-2 -1+, Ki-67 30% T2N1A stage IIa right mastectomy: Benign  Pathology counseling: I discussed the final pathology report of the patient provided  a copy of this report. I discussed the margins as well as lymph node surgeries. We also discussed the final staging along with previously performed ER/PR and HER-2/neu testing.  Recommendation: 1. Mammaprint testing to determine if chemotherapy would be of any benefit followed by 2. Adjuvant radiation therapy followed by 3 Adjuvant antiestrogen therapy  Return to clinic based upon MammaPrint test results    No orders of the defined types were placed in this encounter.  The patient has a good understanding of the overall plan. she agrees with it. she will call with any problems that may develop before the next visit here.   Harriette Ohara, MD 09/03/18

## 2018-09-03 NOTE — Assessment & Plan Note (Signed)
08/21/2018:Bilateral mastectomies: Left mastectomy: IDC grade 3, 2.1 cm, high-grade DCIS, margins negative, negative for LV I, 3/9 lymph nodes positive, ER 95% positive, PR 90% positive, HER-2 -1+, Ki-67 30% T2N1A stage IIa right mastectomy: Benign  Pathology counseling: I discussed the final pathology report of the patient provided  a copy of this report. I discussed the margins as well as lymph node surgeries. We also discussed the final staging along with previously performed ER/PR and HER-2/neu testing.  Recommendation: 1. Mammaprint testing to determine if chemotherapy would be of any benefit followed by 2. Adjuvant radiation therapy followed by 3 Adjuvant antiestrogen therapy  Return to clinic based upon MammaPrint test results

## 2018-09-04 ENCOUNTER — Telehealth: Payer: Self-pay | Admitting: Hematology and Oncology

## 2018-09-04 NOTE — Telephone Encounter (Signed)
No 12/16 los.  °

## 2018-09-05 ENCOUNTER — Telehealth: Payer: Self-pay | Admitting: *Deleted

## 2018-09-05 ENCOUNTER — Encounter (HOSPITAL_COMMUNITY): Payer: Self-pay | Admitting: Hematology and Oncology

## 2018-09-05 DIAGNOSIS — Z170 Estrogen receptor positive status [ER+]: Principal | ICD-10-CM

## 2018-09-05 DIAGNOSIS — C50412 Malignant neoplasm of upper-outer quadrant of left female breast: Secondary | ICD-10-CM

## 2018-09-05 NOTE — Telephone Encounter (Signed)
Received Mammaprint result of HIGH RISK. Physician team notified. Called pt and discussed results. Scheduled and confirmed appt for 09/06/18 at 4:15pm. Denies further questions or needs.

## 2018-09-05 NOTE — Progress Notes (Signed)
Patient Care Team: [name], DO as PCP - General (Family Medicine) Hurley Cisco, MD as Consulting Physician (Rheumatology) [name], MD as Consulting Physician (Orthopedic Surgery) Erroll Luna, MD as Consulting Physician (General Surgery) Nicholas Lose, MD as Consulting Physician (Hematology and Oncology) Kyung Rudd, MD as Consulting Physician (Radiation Oncology)  DIAGNOSIS:    ICD-10-CM   1. Malignant neoplasm of upper-outer quadrant of left breast in female, estrogen receptor positive ([place]) C50.412    Z17.0     SUMMARY OF ONCOLOGIC HISTORY:   Malignant neoplasm of upper-outer quadrant of left breast in female, estrogen receptor positive ([place])   07/13/2018 Initial Diagnosis     Palpable left breast mass, 2 adjacent masses at 2 o'clock position 2 cm biopsy IDC grade 2-3, Ig DCIS, ER 95%, PR 90%, Ki-67 30%, HER-2 -1+; 2:30 position intramammary lymph node 1.3 cm biopsy positive; left axillary lymph node biopsy negative discordant, T2N1, stage IIa    07/25/2018 Cancer Staging    Staging form: Breast, AJCC 8th Edition - Clinical: Stage IIA (cT1c, cN1, cM0, G3, ER+, PR+, HER2-) - Signed by Nicholas Lose, MD on 07/25/2018    08/21/2018 Surgery    Bilateral mastectomies: Left mastectomy: IDC grade 3, 2.1 cm, high-grade DCIS, margins negative, negative for LV I, 3/9 lymph nodes positive, ER 95% positive, PR 90% positive, HER-2 -1+, Ki-67 30% T2N1A stage IIa right mastectomy: Benign    09/03/2018 Cancer Staging    Staging form: Breast, AJCC 8th Edition - Pathologic: Stage IIA (pT2, pN1a, cM0, G3, ER+, PR+, HER2-) - Signed by Nicholas Lose, MD on 09/03/2018    09/03/2018 Miscellaneous    MammaPrint: High risk luminal type B, average 10-year risk of recurrence untreated 29%, predicted 5-year benefit of chemotherapy 94.6% distant metastasis free interval     CHIEF COMPLIANT: Follow-up to discuss mammaprint testing  INTERVAL HISTORY: Paula Jacobs is a 57 y.o. with  above-mentioned history of left breast cancer who underwent bilateral mastectomies. She presents to the clinic alone today to discuss her mammaprint result of high risk and chemotherapy. She states she is not worried about hair loss. She expressed willingness to participate in the cardiac clinical trial. She will use short term disability for chemotherapy to have time off from work and said she will submit the paperwork needed.   REVIEW OF SYSTEMS:   Constitutional: Denies fevers, chills or abnormal weight loss Eyes: Denies blurriness of vision Ears, nose, mouth, throat, and face: Denies mucositis or sore throat Respiratory: Denies cough, dyspnea or wheezes Cardiovascular: Denies palpitation, chest discomfort Gastrointestinal:  Denies nausea, heartburn or change in bowel habits Skin: Denies abnormal skin rashes Lymphatics: Denies new lymphadenopathy or easy bruising Neurological: Denies numbness, tingling or new weaknesses Behavioral/Psych: Mood is stable, no new changes  Extremities: No lower extremity edema Breast: denies any pain or lumps or nodules in either breasts All other systems were reviewed with the patient and are negative.  I have reviewed the past medical history, past surgical history, social history and family history with the patient and they are unchanged from previous note.  ALLERGIES:  is allergic to hydrocodone-acetaminophen; latex; and tramadol hcl.  MEDICATIONS:  Current Outpatient Medications  Medication Sig Dispense Refill  . acetaminophen (TYLENOL) 500 MG tablet Take 1,000 mg by mouth every 6 (six) hours as needed for moderate pain.    . benazepril-hydrochlorthiazide (LOTENSIN HCT) 10-12.5 MG tablet TAKE 1 TABLET BY MOUTH DAILY 90 tablet 0  . ibuprofen (ADVIL,MOTRIN) 800 MG tablet Take 1 tablet (  800 mg total) by mouth every 8 (eight) hours as needed. 30 tablet 0  . levothyroxine (SYNTHROID, LEVOTHROID) 175 MCG tablet TAKE 1 TABLET BY MOUTH EVERY MORNING (Patient  taking differently: Take 175 mcg by mouth daily before breakfast. ) 30 tablet 5  . methocarbamol (ROBAXIN) 500 MG tablet Take 1 tablet (500 mg total) by mouth every 6 (six) hours as needed for muscle spasms. 10 tablet 0  . ondansetron (ZOFRAN-ODT) 4 MG disintegrating tablet Take 1 tablet (4 mg total) by mouth every 6 (six) hours as needed for nausea. 20 tablet 0  . oxyCODONE (OXY IR/ROXICODONE) 5 MG immediate release tablet Take 1-2 tablets (5-10 mg total) by mouth every 4 (four) hours as needed for moderate pain. 15 tablet 0   No current facility-administered medications for this visit.     PHYSICAL EXAMINATION: ECOG PERFORMANCE STATUS: 0 - Asymptomatic  Vitals:   09/06/18 1601  BP: 128/83  Pulse: 84  Resp: 17  Temp: 98.6 F (37 C)  SpO2: 100%   Filed Weights   09/06/18 1601  Weight: 194 lb 9.6 oz (88.3 kg)    GENERAL:alert, no distress and comfortable SKIN: skin color, texture, turgor are normal, no rashes or significant lesions EYES: normal, Conjunctiva are pink and non-injected, sclera clear OROPHARYNX:no exudate, no erythema and lips, buccal mucosa, and tongue normal  NECK: supple, thyroid normal size, non-tender, without nodularity LYMPH:  no palpable lymphadenopathy in the cervical, axillary or inguinal LUNGS: clear to auscultation and percussion with normal breathing effort HEART: regular rate & rhythm and no murmurs and no lower extremity edema ABDOMEN:abdomen soft, non-tender and normal bowel sounds MUSCULOSKELETAL:no cyanosis of digits and no clubbing  NEURO: alert & oriented x 3 with fluent speech, no focal motor/sensory deficits EXTREMITIES: No lower extremity edema  LABORATORY DATA:  I have reviewed the data as listed CMP Latest Ref Rng & Units 08/22/2018 08/21/2018 08/13/2018  Glucose 70 - 99 mg/dL 127(H) - 94  BUN 6 - 20 mg/dL 16 - 11  Creatinine 0.44 - 1.00 mg/dL 1.03(H) 0.92 0.71  Sodium 135 - 145 mmol/L 137 - 139  Potassium 3.5 - 5.1 mmol/L 4.5 - 3.8    Chloride 98 - 111 mmol/L 102 - 107  CO2 22 - 32 mmol/L 25 - 25  Calcium 8.9 - 10.3 mg/dL 8.1(L) - 9.0  Total Protein 6.5 - 8.1 g/dL 5.4(L) - -  Total Bilirubin 0.3 - 1.2 mg/dL 0.3 - -  Alkaline Phos 38 - 126 U/L 35(L) - -  AST 15 - 41 U/L 18 - -  ALT 0 - 44 U/L 12 - -    Lab Results  Component Value Date   WBC 11.8 (H) 08/22/2018   HGB 9.2 (L) 08/22/2018   HCT 30.5 (L) 08/22/2018   MCV 87.6 08/22/2018   PLT 378 08/22/2018   NEUTROABS 4.5 07/25/2018    ASSESSMENT & PLAN:  Malignant neoplasm of upper-outer quadrant of left breast in female, estrogen receptor positive ([place]) 08/21/2018:Bilateral mastectomies: Left mastectomy: IDC grade 3, 2.1 cm, high-grade DCIS, margins negative, negative for LV I, 3/9 lymph nodes positive, ER 95% positive, PR 90% positive, HER-2 -1+, Ki-67 30% T2N1A stage IIa right mastectomy: Benign MammaPrint: High risk luminal type B  Recommendation: 1.  Adjuvant chemotherapy with dose dense Adriamycin and Cytoxan x4 followed by weekly Taxol x12 2.  Followed by adjuvant antiestrogen therapy  Chemotherapy Counseling: I discussed the risks and benefits of chemotherapy including the risks of nausea/ vomiting, risk of infection from  low WBC count, fatigue due to chemo or anemia, bruising or bleeding due to low platelets, mouth sores, loss/ change in taste and decreased appetite. Liver and kidney function will be monitored through out chemotherapy as abnormalities in liver and kidney function may be a side effect of treatment. Cardiac dysfunction due to Adriamycin was discussed in detail. Risk of permanent bone marrow dysfunction and leukemia due to chemo were also discussed.  We will request port placement, echocardiogram, chemo class Also discussed upbeat clinical trial Return to clinic in January to start chemo.  No orders of the defined types were placed in this encounter.  The patient has a good understanding of the overall plan. she agrees with it. she will  call with any problems that may develop before the next visit here.  Nicholas Lose, MD 09/06/2018   I, Cloyde Reams Dorshimer, am acting as scribe for Nicholas Lose, MD.  I have reviewed the above documentation for accuracy and completeness, and I agree with the above.

## 2018-09-06 ENCOUNTER — Encounter: Payer: Self-pay | Admitting: *Deleted

## 2018-09-06 ENCOUNTER — Encounter: Payer: Self-pay | Admitting: Medical Oncology

## 2018-09-06 ENCOUNTER — Inpatient Hospital Stay (HOSPITAL_BASED_OUTPATIENT_CLINIC_OR_DEPARTMENT_OTHER): Payer: Managed Care, Other (non HMO) | Admitting: Hematology and Oncology

## 2018-09-06 ENCOUNTER — Other Ambulatory Visit: Payer: Self-pay | Admitting: Hematology and Oncology

## 2018-09-06 DIAGNOSIS — Z170 Estrogen receptor positive status [ER+]: Principal | ICD-10-CM

## 2018-09-06 DIAGNOSIS — C50412 Malignant neoplasm of upper-outer quadrant of left female breast: Secondary | ICD-10-CM

## 2018-09-06 DIAGNOSIS — Z79899 Other long term (current) drug therapy: Secondary | ICD-10-CM | POA: Diagnosis not present

## 2018-09-06 DIAGNOSIS — Z9013 Acquired absence of bilateral breasts and nipples: Secondary | ICD-10-CM | POA: Diagnosis not present

## 2018-09-06 MED ORDER — LIDOCAINE-PRILOCAINE 2.5-2.5 % EX CREA: TOPICAL_CREAM | CUTANEOUS | 3 refills | Status: DC

## 2018-09-06 MED ORDER — DEXAMETHASONE 4 MG PO TABS: ORAL_TABLET | ORAL | 1 refills | Status: DC

## 2018-09-06 MED ORDER — PROCHLORPERAZINE MALEATE 10 MG PO TABS: 10.0000 mg | ORAL_TABLET | Freq: Four times a day (QID) | ORAL | 1 refills | Status: DC | PRN

## 2018-09-06 MED ORDER — LORAZEPAM 0.5 MG PO TABS: 0.5000 mg | ORAL_TABLET | Freq: Every evening | ORAL | 0 refills | Status: DC | PRN

## 2018-09-06 MED ORDER — ONDANSETRON HCL 8 MG PO TABS: 8.0000 mg | ORAL_TABLET | Freq: Two times a day (BID) | ORAL | 1 refills | Status: DC | PRN

## 2018-09-06 NOTE — Progress Notes (Signed)
START ON PATHWAY REGIMEN - Breast   Dose-Dense AC q14 days:   A cycle is every 14 days:     Doxorubicin      Cyclophosphamide      [id number]-[id number]   **Always confirm dose/schedule in your pharmacy ordering system**  Paclitaxel 80 mg/m2 Weekly:   Administer weekly:     Paclitaxel   **Always confirm dose/schedule in your pharmacy ordering system**  Patient Characteristics: Postoperative without Neoadjuvant Therapy (Pathologic Staging), Invasive Disease, Adjuvant Therapy, HER2 Negative/Unknown/Equivocal, ER Positive, Node Positive, Node Positive (1-3), MammaPrint(R) Ordered, High Genomic Risk Therapeutic Status: Postoperative without Neoadjuvant Therapy (Pathologic Staging) AJCC Grade: G3 AJCC N Category: pN1 AJCC M Category: cM0 ER Status: Positive (+) AJCC 8 Stage Grouping: IIA HER2 Status: Negative (-) Oncotype Dx Recurrence Score: Ordered Other Genomic Test AJCC T Category: pT2 PR Status: Positive (+) Has this patient completed genomic testing<= Yes - MammaPrint(R) MammaPrint(R) Score: High Genomic Risk Intent of Therapy: Curative Intent, Discussed with Patient

## 2018-09-06 NOTE — Assessment & Plan Note (Signed)
08/21/2018:Bilateral mastectomies: Left mastectomy: IDC grade 3, 2.1 cm, high-grade DCIS, margins negative, negative for LV I, 3/9 lymph nodes positive, ER 95% positive, PR 90% positive, HER-2 -1+, Ki-67 30% T2N1A stage IIa right mastectomy: Benign MammaPrint: High risk luminal type B  Recommendation: 1.  Adjuvant chemotherapy with dose dense Adriamycin and Cytoxan x4 followed by weekly Taxol x12 2.  Followed by adjuvant antiestrogen therapy  Chemotherapy Counseling: I discussed the risks and benefits of chemotherapy including the risks of nausea/ vomiting, risk of infection from low WBC count, fatigue due to chemo or anemia, bruising or bleeding due to low platelets, mouth sores, loss/ change in taste and decreased appetite. Liver and kidney function will be monitored through out chemotherapy as abnormalities in liver and kidney function may be a side effect of treatment. Cardiac dysfunction due to Adriamycin was discussed in detail. Risk of permanent bone marrow dysfunction and leukemia due to chemo were also discussed.  We will request port placement, echocardiogram, chemo class Also discussed upbeat clinical trial Return to clinic in January to start chemo.

## 2018-09-06 NOTE — Progress Notes (Signed)
Dr. Lindi Adie referred patient to UPBEAT study today. I met with patient, here alone, in exam room after her appointment with Dr. Lindi Adie. Patient confirms that Dr. Lindi Adie spoke with her about the study and patient is expressing interest in knowing more. I informed patient about the study cardiac MRI's and inquired with her whether she is claustrophobic. Patient states that she is "extremely" claustrophobic and states for previous MRI's she had to premedicate with valium and stats this works for her. Patient also denies having any metal implants. I provided patient with study authorization and consent forms for her review, as well as the [place] and my contact number. Patient and I made arrangements for me to contact her on the 23rd to see if she had any questions regarding the study. All patient's questions answered to her satisfaction and patient was thanked for her time and interest in study and was encouraged to call Dr. Lindi Adie or myself with any questions or concerns she may have. Dr. Lindi Adie was informed that patient will need a prescription for valium to complete the study required cardiac MRI, should patient be found to be eligible.  Per Dr. Lindi Adie, patient to start chemotherapy on January the 6th.  Maxwell Marion, RN, BSN, [facility] Clinical Research 09/06/2018 4:54 PM

## 2018-09-07 ENCOUNTER — Ambulatory Visit: Payer: Self-pay | Admitting: Surgery

## 2018-09-10 ENCOUNTER — Telehealth: Payer: Self-pay

## 2018-09-10 NOTE — Telephone Encounter (Signed)
Pt came in the lobby today and dropped off [employer] disability paperwork and states that she needs the form filled out for her chemo, starting in January. Will forward to HIM - Ivin Poot) for completion .

## 2018-09-13 ENCOUNTER — Inpatient Hospital Stay: Payer: Managed Care, Other (non HMO)

## 2018-09-13 ENCOUNTER — Telehealth: Payer: Self-pay | Admitting: Hematology and Oncology

## 2018-09-13 ENCOUNTER — Inpatient Hospital Stay (HOSPITAL_COMMUNITY): Admission: RE | Admit: 2018-09-13 | Payer: Managed Care, Other (non HMO) | Source: Ambulatory Visit

## 2018-09-13 NOTE — Telephone Encounter (Signed)
Called patient per 12/26 sch message to r/s appt - no answer - left message for patient to call back if r/s still needed.

## 2018-09-13 NOTE — Progress Notes (Signed)
FMLA successfully faxed to [place] at [phone]. Mailed copy to patient address on file.

## 2018-09-14 ENCOUNTER — Encounter (HOSPITAL_BASED_OUTPATIENT_CLINIC_OR_DEPARTMENT_OTHER): Payer: Self-pay | Admitting: *Deleted

## 2018-09-14 ENCOUNTER — Other Ambulatory Visit: Payer: Self-pay

## 2018-09-18 ENCOUNTER — Encounter: Payer: Self-pay | Admitting: Hematology and Oncology

## 2018-09-18 ENCOUNTER — Telehealth: Payer: Self-pay | Admitting: Hematology and Oncology

## 2018-09-18 ENCOUNTER — Encounter: Payer: Self-pay | Admitting: *Deleted

## 2018-09-18 ENCOUNTER — Ambulatory Visit (HOSPITAL_COMMUNITY)
Admission: RE | Admit: 2018-09-18 | Discharge: 2018-09-18 | Disposition: A | Discharge status: Home / Self Care | Payer: Managed Care, Other (non HMO) | Source: Ambulatory Visit | Attending: Hematology and Oncology | Admitting: Hematology and Oncology

## 2018-09-18 ENCOUNTER — Inpatient Hospital Stay: Payer: Managed Care, Other (non HMO)

## 2018-09-18 DIAGNOSIS — Z9013 Acquired absence of bilateral breasts and nipples: Secondary | ICD-10-CM | POA: Diagnosis not present

## 2018-09-18 DIAGNOSIS — Z170 Estrogen receptor positive status [ER+]: Secondary | ICD-10-CM | POA: Diagnosis not present

## 2018-09-18 DIAGNOSIS — C50412 Malignant neoplasm of upper-outer quadrant of left female breast: Secondary | ICD-10-CM

## 2018-09-18 DIAGNOSIS — I10 Essential (primary) hypertension: Secondary | ICD-10-CM | POA: Diagnosis not present

## 2018-09-18 DIAGNOSIS — Z6835 Body mass index (BMI) 35.0-35.9, adult: Secondary | ICD-10-CM | POA: Diagnosis not present

## 2018-09-18 NOTE — Progress Notes (Signed)
  Echocardiogram 2D Echocardiogram has been performed.  Bobbye Charleston 09/18/2018, 11:53 AM

## 2018-09-18 NOTE — Progress Notes (Unsigned)
09/18/18 at 3:47pm - [id number] study notes (consent visit)- The research nurse met with the pt for 45 minutes going over the UpBeat/[id number] study.  The pt said that she had read most of the consent form at home.  The research nurse read/reviewed each page of the consent form and hipaa form with the pt.  The research nurse answered all of the pt's questions.  The pt was told that her participation was voluntary.  The pt was also told that she can stop her participation at any time.  The pt was explained the possible risks associated with participation in the trial. The pt stated that she is "claustrophobic", and she will definitely need some anti-anxiety medication during the cardiac MRI testing.  The pt said that she really wants to participate in the trial.  She said that she wants to help other cancer patients in the future.  The pt was told that she will be given a $25 gift card after completing all of the assessments for each of the study time points.  The pt said that she is not participating in the trial for the money.  The pt signed the consent form at 2:46pm.  The pt declined to have her samples kept in a Biobank for use in future DNA health research.  She agreed to be contacted in the future regarding other research.  The pt also declined to receive text messages from the study.  The pt was given a copy of her signed documents for her records.  The pt was told that Otilio Miu, research nurse, will be contact to schedule her baseline assessments.  The pt said that her chemo is scheduled to begin on 09/25/18.  The pt was thanked for participation and support of this trial.   Brion Aliment RN, BSN, [facility] Clinical Research Nurse 09/18/2018 3:58 PM

## 2018-09-18 NOTE — Telephone Encounter (Signed)
Unable to reach patient - left message for patient with appt date and time per 12/30 sch message.

## 2018-09-18 NOTE — Progress Notes (Signed)
Patient came in for me to introduce myself as Arboriculturist and offer available resources. Discussed insurance renewing in January and ded/OOP starting over. Advised patient of copay assistance available for [employer] via [employer] complete. She verbalized understanding and gave me permission to enroll.  Discussed one-time $1000 Radio broadcast assistant. Patient would like to apply and will bring her proof of income when she comes for her chemo visit next week.  She has my card for any additional financial questions or concerns.

## 2018-09-20 ENCOUNTER — Other Ambulatory Visit: Payer: Self-pay

## 2018-09-20 ENCOUNTER — Encounter: Payer: Self-pay | Admitting: Physical Therapy

## 2018-09-20 ENCOUNTER — Ambulatory Visit: Payer: BLUE CROSS/BLUE SHIELD | Attending: Surgery | Admitting: Physical Therapy

## 2018-09-20 DIAGNOSIS — Z170 Estrogen receptor positive status [ER+]: Secondary | ICD-10-CM | POA: Insufficient documentation

## 2018-09-20 DIAGNOSIS — R293 Abnormal posture: Secondary | ICD-10-CM | POA: Diagnosis not present

## 2018-09-20 DIAGNOSIS — M25612 Stiffness of left shoulder, not elsewhere classified: Secondary | ICD-10-CM

## 2018-09-20 DIAGNOSIS — Z483 Aftercare following surgery for neoplasm: Secondary | ICD-10-CM | POA: Diagnosis not present

## 2018-09-20 DIAGNOSIS — C50412 Malignant neoplasm of upper-outer quadrant of left female breast: Secondary | ICD-10-CM | POA: Diagnosis not present

## 2018-09-20 DIAGNOSIS — R600 Localized edema: Secondary | ICD-10-CM

## 2018-09-20 NOTE — Therapy (Signed)
[place], [place], [address] Phone: [phone]   Fax:  [phone]  Physical Therapy Treatment  Patient Details  Name: Paula Jacobs MRN: [id number] Date of Birth: [birth year] Referring Provider (PT): Dr. Erroll Luna   Encounter Date: 09/20/2018  PT End of Session - 09/20/18 1150    Visit Number  2    Number of Visits  10    Date for PT Re-Evaluation  10/18/18    PT Start Time  1055    PT Stop Time  1145    PT Time Calculation (min)  50 min    Activity Tolerance  Patient tolerated treatment well    Behavior During Therapy  [facility] for tasks assessed/performed       Past Medical History:  Diagnosis Date  . ANEMIA-NOS 04/23/2008  . Aneurysm ([place]) 10/19/2017   Evaluated by MRA 10/2017, 68m, stable  . [place]inal pain ([place]    went to ED in april 2018 c/o chest pain over last 2 months ; had EKG, CXR  and troponin negative per physician suspected musculoskeletal  ; dc'd with ibuprofen  and recc f/u with outpatient stress test; see care everywhere ED visit  ; patient denies recurrence of Chest pain since, endorses occ palpitations   . DEPRESSION 04/23/2008  . DJD (degenerative joint disease)   . Family history of breast cancer   . Hyperplastic colon polyp    x2  . Hypertension   . HYPOTHYROIDISM 10/17/2007  . Osteoarthritis   . Palpitations    freq at night;  at pre-op states she drinks caffeine and that makes it worse; says the palpitations started after they took her thyroid   . PAT 10/27/2009   Qualifier: Diagnosis of  By: MAundra Dubin MD, [name]    . Pneumonia    "in the past, havent had it in years"  . PONV (postoperative nausea and vomiting)    only after 1979 surgery  . RA (rheumatoid arthritis) ([place]    "problems in feet, hands, and knees"  . 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 SVT (supraventricular tachycardia) (HCC)    chronic   . Tubulovillous adenoma of colon     Past  Surgical History:  Procedure Laterality Date  . ABDOMINAL HYSTERECTOMY    . APPENDECTOMY    . BUNIONECTOMY  10/2009 right & 02/03/10 left  . COLONOSCOPY W/ POLYPECTOMY  2015  . HAMMER TOE SURGERY     "all toes have pins, done with bunionectomy"  . INGUINAL HERNIA REPAIR    . KNEE CLOSED REDUCTION Left 12/05/2017   Procedure: CLOSED MANIPULATION LEFT KNEE;  Surgeon: OParalee Cancel MD;  Location: [facility];  Service: Orthopedics;  Laterality: Left;  .Marland KitchenMASTECTOMY WITH RADIOACTIVE SEED GUIDED EXCISION AND AXILLARY SENTINEL LYMPH NODE BIOPSY Bilateral 08/21/2018   Procedure: BILATERAL SIMPLE MASTECTOMIES WITH LEFT AXILLARY RADIOACTIVE SEED GUIDED LYMPH NODE EXCISION AND LEFT AXILLARY SENTINEL LYMPH NODE BIOPSY;  Surgeon: CErroll Luna MD;  [facility]  Service: General;  Laterality: Bilateral;  . neck fusion     x3  . THYROIDECTOMY  2001   for nodules  . TOE AMPUTATION  1979   6th toe removed from each foot  . TOTAL KNEE ARTHROPLASTY Left 10/21/2014   Procedure: LEFT TOTAL KNEE ARTHROPLASTY;  Surgeon: MMauri Pole MD;  [facility];  Service: Orthopedics;  Laterality: Left;  . TOTAL KNEE ARTHROPLASTY Right 12/05/2017   Procedure: RIGHT TOTAL KNEE ARTHROPLASTY;  Surgeon: [name], MD;  [facility]: [facility];  Service: Orthopedics;  Laterality: Right;  70 mins    There were no vitals filed for this visit.  Subjective Assessment - 09/20/18 1100    Subjective  Patient underwent a bilateral mastectomy with a left targeted node dissection (3/9 positive nodes) on 08/21/18. Drains were moved 2 weeks ago. Her mammaprint was high and she will have a port placed on 09/25/18 and will begin chemotherapy on 09/26/18. She will also undergo radiation and anti-estrogen therapy.    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 Patient underwent a bilateral mastectomy with a left targeted node dissection (3/9 positive nodes) on 08/21/18. It  is ER/PR positive and HER2 negative with a Ki67 of 30%.  She has a history of bilateral knee replacements (right in 5/19 and left in 2016) and she has had 3 cervical fusions.    Patient Stated Goals  Get my arms back to normal    Currently in Pain?  No/denies         [facility] PT Assessment - 09/20/18 0001      Assessment   Medical Diagnosis  s/p bil mastectomies with left targeted node dissection    Referring Provider (PT)  Dr. Marcello Moores Corn[place]    Onset Date/Surgical Date  08/21/18    Hand Dominance  Right    Prior Therapy  Baseline assessment      Precautions   Precautions  Other (comment)    Precaution Comments  recent surgery      Restrictions   Weight Bearing Restrictions  No      Balance Screen   Has the patient fallen in the past 6 months  No    Has the patient had a decrease in activity level because of a fear of falling?   No    Is the patient reluctant to leave their home because of a fear of falling?   No      Home Environment   Living Environment  Private residence    Living Arrangements  Spouse/significant other;Children   Husband and 41 y.o. daughter   Available Help at Discharge  Family      Prior Function   Level of Independence  Independent    Vocation  Full time employment    Vocation Requirements  Works at [employer] where is stays/lives while working; not back at work yet    Leisure  She walks 30 min daily      Cognition   Overall Cognitive Status  Within Functional Limits for tasks assessed      Observation/Other Assessments   Observations  Incisions appear to be healing well. edema present on left chest (see photo).   Issued compression foam to be placed on left chest     Posture/Postural Control   Posture/Postural Control  Postural limitations    Postural Limitations  Rounded Shoulders;Forward head;Increased thoracic kyphosis   Forward head appears due to previous cervical fusions     ROM / Strength   AROM / PROM / Strength  AROM      AROM    AROM Assessment Site  Shoulder    Right/Left Shoulder  Right    Right Shoulder Extension  38 Degrees    Right Shoulder Flexion  135 Degrees    Right Shoulder ABduction  143 Degrees    Right Shoulder Internal Rotation  65 Degrees    Right Shoulder External Rotation  82  Degrees    Left Shoulder Extension  39 Degrees    Left Shoulder Flexion  91 Degrees    Left Shoulder ABduction  99 Degrees    Left Shoulder Internal Rotation  59 Degrees    Left Shoulder External Rotation  63 Degrees        LYMPHEDEMA/ONCOLOGY QUESTIONNAIRE - 09/20/18 1106      Type   Cancer Type  Left breast cancer      Surgeries   Mastectomy Date  08/21/18    Axillary Lymph Node Dissection Date  08/21/18    Number Lymph Nodes Removed  9   on left     Treatment   Active Chemotherapy Treatment  No   Begins 09/26/18   Past Chemotherapy Treatment  No    Active Radiation Treatment  No    Past Radiation Treatment  No    Current Hormone Treatment  No    Past Hormone Therapy  No      What other symptoms do you have   Are you Having Heaviness or Tightness  Yes    Are you having Pain  No    Are you having pitting edema  No    Is it Hard or Difficult finding clothes that fit  No    Do you have infections  No    Is there Decreased scar mobility  Yes    Stemmer Sign  No      Lymphedema Assessments   Lymphedema Assessments  Upper extremities      Right Upper Extremity Lymphedema   10 cm Proximal to Olecranon Process  38.2 cm    Olecranon Process  25 cm    10 cm Proximal to Ulnar Styloid Process  22.6 cm    Just Proximal to Ulnar Styloid Process  15.6 cm    Across Hand at [employer]  18.3 cm    At [place] of 2nd Digit  6.1 cm      Left Upper Extremity Lymphedema   10 cm Proximal to Olecranon Process  39.7 cm    Olecranon Process  24.6 cm    10 cm Proximal to Ulnar Styloid Process  22 cm    Just Proximal to Ulnar Styloid Process  15.1 cm    Across Hand at [employer]  17.3 cm    At [place] of 2nd  Digit  5.7 cm        Quick Dash - 09/20/18 0001    Open a tight or new jar  Unable    Do heavy household chores (wash walls, wash floors)  Unable    Carry a shopping bag or briefcase  Mild difficulty    Wash your back  Mild difficulty    Use a knife to cut food  Mild difficulty    Recreational activities in which you take some force or impact through your arm, shoulder, or hand (golf, hammering, tennis)  Severe difficulty    During the past week, to what extent has your arm, shoulder or hand problem interfered with your normal social activities with family, friends, neighbors, or groups?  Quite a bit    During the past week, to what extent has your arm, shoulder or hand problem limited your work or other regular daily activities  Modererately    Arm, shoulder, or hand pain.  Mild    Tingling (pins and needles) in your arm, shoulder, or hand  None    Difficulty Sleeping  Mild difficulty  DASH Score  47.73 %                     PT Education - 09/20/18 1150    Education Details  Use of compression foam for left chest to reduce edema    Person(s) Educated  Patient    Methods  Explanation    Comprehension  Verbalized understanding          PT Long Term Goals - 09/20/18 1154      PT LONG TERM GOAL #1   Title  Patient will demonstrate she has regained shoulder ROM and function post operatively compared to baseline measurements.    Time  4    Period  Weeks    Status  Partially Met      PT LONG TERM GOAL #2   Title  Patient will increase left shoulder active flexion to >/= 140 degrees for increased ease reaching.    Baseline  91 degrees    Time  4    Period  Weeks    Status  New      PT LONG TERM GOAL #3   Title  Patient will increase left shoulder active abduction to >/= 160 degrees for increased ease reaching.    Baseline  99 degrees    Time  4    Period  Weeks    Status  New      PT LONG TERM GOAL #4   Title  Patient will verbalize good understanding  of lymphedema risk reduction practices.    Time  4    Period  Weeks    Status  New      PT LONG TERM GOAL #5   Title  Patient will improve her DASH score to </= 10 for improved overall left upper extremity function.    Baseline  47.7    Time  4    Period  Weeks    Status  New            Plan - 09/20/18 1151    Clinical Impression Statement  Patient is healing well s/p bilateral mastectomy with a left targeted axillary node dissection (3/9 nodes were positive) on 08/21/18. She will begin chemotherapy on 09/26/18 followed by radiation. She has very limited left shoulder AROM with left chest edema and scar tissue. She will benefit from physical therapy to address those deficits and assist her in returning to baseline.    Rehab Potential  Excellent    Clinical Impairments Affecting Rehab Potential  Will begin chemo on 09/26/18    PT Frequency  2x / week    PT Duration  4 weeks    PT Treatment/Interventions  ADLs/Self Care Home Management;Therapeutic exercise;Patient/family education;Manual techniques;Manual lymph drainage;Scar mobilization;Passive range of motion;Therapeutic activities    PT Next Visit Plan  Begin PROM left shoulder; ROM exericses     PT Home Exercise Plan  Post op shoulder ROM HEP    Recommended Other Services  ABC class on 10/08/18    Consulted and Agree with Plan of Care  Patient       Patient will benefit from skilled therapeutic intervention in order to improve the following deficits and impairments:  Decreased knowledge of precautions, Postural dysfunction, Decreased range of motion, Impaired UE functional use, Pain, Increased fascial restricitons, Decreased knowledge of use of DME, Increased edema, Decreased strength  Visit Diagnosis: Malignant neoplasm of upper-outer quadrant of left breast in female, estrogen receptor positive ([place]) - Plan: PT plan of  care cert/re-cert  Abnormal posture - Plan: PT plan of care cert/re-cert  Aftercare following surgery for  neoplasm - Plan: PT plan of care cert/re-cert  Stiffness of left shoulder, not elsewhere classified - Plan: PT plan of care cert/re-cert  Localized edema - Plan: PT plan of care cert/re-cert     Problem List Patient Active Problem List   Diagnosis Date Noted  . Breast cancer, left ([place]) 08/22/2018  . Breast cancer, stage 2, left ([place]) 08/21/2018  . Genetic testing 08/08/2018  . Family history of breast cancer   . Malignant neoplasm of upper-outer quadrant of left breast in female, estrogen receptor positive ([place]) 07/19/2018  . 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 Aneurysm ([place]) 10/19/2017  . Morbid obesity ([place]) 10/22/2014  . 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 Rheumatoid arthritis ([place]) 09/26/2013  . Hypertension 01/31/2011  . Hypothyroidism 10/17/2007   Annia Friendly, PT 09/20/18 11:58 AM  [address] [place], [place], [address] Phone: [phone]   Fax:  [phone]  Name: Paula Jacobs MRN: [id number] Date of Birth: 26-Mar-1961

## 2018-09-21 ENCOUNTER — Other Ambulatory Visit (HOSPITAL_COMMUNITY): Payer: Self-pay | Admitting: Hematology and Oncology

## 2018-09-21 ENCOUNTER — Telehealth: Payer: Self-pay

## 2018-09-21 DIAGNOSIS — C50919 Malignant neoplasm of unspecified site of unspecified female breast: Secondary | ICD-10-CM

## 2018-09-21 NOTE — Telephone Encounter (Signed)
Spoke with pt. Asked her if she was available to come in Monday January 7th to complete baseline assessments and cardiac MRI. Pt agreed to come in after her appointment at 11am with Dr. Brantley Stage and for a cardiac MRI at 2pm.

## 2018-09-24 ENCOUNTER — Encounter (HOSPITAL_BASED_OUTPATIENT_CLINIC_OR_DEPARTMENT_OTHER)
Admission: RE | Admit: 2018-09-24 | Discharge: 2018-09-24 | Disposition: A | Discharge status: Home / Self Care | Payer: BLUE CROSS/BLUE SHIELD | Source: Ambulatory Visit | Attending: Surgery | Admitting: Surgery

## 2018-09-24 ENCOUNTER — Ambulatory Visit (HOSPITAL_COMMUNITY): Admission: RE | Admit: 2018-09-24 | Payer: BLUE CROSS/BLUE SHIELD | Source: Ambulatory Visit

## 2018-09-24 ENCOUNTER — Inpatient Hospital Stay: Payer: BLUE CROSS/BLUE SHIELD | Attending: Hematology and Oncology | Admitting: Medical Oncology

## 2018-09-24 ENCOUNTER — Encounter: Payer: Self-pay | Admitting: Medical Oncology

## 2018-09-24 ENCOUNTER — Ambulatory Visit: Payer: BLUE CROSS/BLUE SHIELD

## 2018-09-24 DIAGNOSIS — C50412 Malignant neoplasm of upper-outer quadrant of left female breast: Secondary | ICD-10-CM | POA: Diagnosis not present

## 2018-09-24 DIAGNOSIS — Z170 Estrogen receptor positive status [ER+]: Principal | ICD-10-CM

## 2018-09-24 DIAGNOSIS — Z79899 Other long term (current) drug therapy: Secondary | ICD-10-CM | POA: Diagnosis not present

## 2018-09-24 DIAGNOSIS — Z87891 Personal history of nicotine dependence: Secondary | ICD-10-CM | POA: Diagnosis not present

## 2018-09-24 DIAGNOSIS — M069 Rheumatoid arthritis, unspecified: Secondary | ICD-10-CM | POA: Diagnosis not present

## 2018-09-24 DIAGNOSIS — E039 Hypothyroidism, unspecified: Secondary | ICD-10-CM | POA: Diagnosis not present

## 2018-09-24 DIAGNOSIS — Z6835 Body mass index (BMI) 35.0-35.9, adult: Secondary | ICD-10-CM | POA: Diagnosis not present

## 2018-09-24 DIAGNOSIS — Z79890 Hormone replacement therapy (postmenopausal): Secondary | ICD-10-CM | POA: Diagnosis not present

## 2018-09-24 DIAGNOSIS — Z803 Family history of malignant neoplasm of breast: Secondary | ICD-10-CM | POA: Diagnosis not present

## 2018-09-24 DIAGNOSIS — Z9013 Acquired absence of bilateral breasts and nipples: Secondary | ICD-10-CM | POA: Diagnosis not present

## 2018-09-24 DIAGNOSIS — I10 Essential (primary) hypertension: Secondary | ICD-10-CM | POA: Diagnosis not present

## 2018-09-24 DIAGNOSIS — Z96653 Presence of artificial knee joint, bilateral: Secondary | ICD-10-CM | POA: Diagnosis not present

## 2018-09-24 LAB — CBC WITH DIFFERENTIAL/PLATELET
ABS IMMATURE GRANULOCYTES: 0.03 10*3/uL (ref 0.00–0.07)
Basophils Absolute: 0 10*3/uL (ref 0.0–0.1)
Basophils Relative: 1 %
Eosinophils Absolute: 0.1 10*3/uL (ref 0.0–0.5)
Eosinophils Relative: 1 %
HCT: 39.4 % (ref 36.0–46.0)
Hemoglobin: 11.9 g/dL — ABNORMAL LOW (ref 12.0–15.0)
Immature Granulocytes: 0 %
Lymphocytes Relative: 33 %
Lymphs Abs: 2.9 10*3/uL (ref 0.7–4.0)
MCH: 26.4 pg (ref 26.0–34.0)
MCHC: 30.2 g/dL (ref 30.0–36.0)
MCV: 87.6 fL (ref 80.0–100.0)
MONO ABS: 0.4 10*3/uL (ref 0.1–1.0)
Monocytes Relative: 5 %
Neutro Abs: 5.2 10*3/uL (ref 1.7–7.7)
Neutrophils Relative %: 60 %
Platelets: 451 10*3/uL — ABNORMAL HIGH (ref 150–400)
RBC: 4.5 MIL/uL (ref 3.87–5.11)
RDW: 13.2 % (ref 11.5–15.5)
WBC: 8.7 10*3/uL (ref 4.0–10.5)
nRBC: 0 % (ref 0.0–0.2)

## 2018-09-24 LAB — COMPREHENSIVE METABOLIC PANEL
ALT: 16 U/L (ref 0–44)
AST: 17 U/L (ref 15–41)
Albumin: 3.8 g/dL (ref 3.5–5.0)
Alkaline Phosphatase: 48 U/L (ref 38–126)
Anion gap: 9 (ref 5–15)
BUN: 14 mg/dL (ref 6–20)
CO2: 27 mmol/L (ref 22–32)
Calcium: 9.3 mg/dL (ref 8.9–10.3)
Chloride: 102 mmol/L (ref 98–111)
Creatinine, Ser: 0.82 mg/dL (ref 0.44–1.00)
GFR calc Af Amer: >60 mL/min (ref >60)
GFR calc non Af Amer: >60 mL/min (ref >60)
GLUCOSE: 113 mg/dL — AB (ref 70–99)
Potassium: 3.4 mmol/L — ABNORMAL LOW (ref 3.5–5.1)
Sodium: 138 mmol/L (ref 135–145)
Total Bilirubin: 0.5 mg/dL (ref 0.3–1.2)
Total Protein: 7 g/dL (ref 6.5–8.1)

## 2018-09-24 NOTE — H&P (Signed)
[name] Documented: 09/24/2018 11:11 AM Location: [facility] Patient #: [id number] DOB: June 01, 1961 Married / Language: English / Race: Black or African American Female  History of Present Illness Marcello Moores A. Skyrah Krupp MD; 09/24/2018 11:30 AM) Patient words: Patient returns to weeks after bilateral simple vasectomy. She will require chemotherapy and is scheduled for Port-A-Cath placement tomorrow. I discussed procedure with the patient FOR doing this.  The patient is a 58 year old female.   Allergies (Sabrina Canty, CMA; 09/24/2018 11:12 AM) HYDROcodone-Acetaminophen *ANALGESICS - OPIOID* Itching. Latex Exam Gloves *MEDICAL DEVICES AND SUPPLIES* Itching. traMADol HCl *ANALGESICS - OPIOID* Itching, Nausea. Allergies Reconciled  Medication History (Sabrina Canty, CMA; 09/24/2018 11:12 AM) Lotensin HCT (10-12.5MG  Tablet, Oral) Active. Albuterol (90MCG/ACT Aerosol Soln, Inhalation) Active. Levothyroxine Sodium (175MCG Tablet, Oral) Active. Medications Reconciled    Vitals (Sabrina Canty CMA; 09/24/2018 11:13 AM) 09/24/2018 11:12 AM Weight: 196.13 lb Height: 62in Body Surface Area: 1.9 m Body Mass Index: 35.87 kg/m  Temp.: 96.51F(Temporal)  Pulse: 103 (Regular)  BP: 128/80 (Sitting, Left Arm, Standard)      Physical Exam (Wilberta Dorvil A. Keilana Morlock MD; 09/24/2018 11:30 AM)  Breast Note: Both breasts absent. Right incision clean dry and intact. Left incision clean dry and intact with small to moderate seroma noted. No redness or tenderness.    Assessment & Plan (Crosley Stejskal A. Marivel Mcclarty MD; 09/24/2018 11:31 AM)  POST-OPERATIVE STATE [phone]) Impression: Placement of Port-A-Cath due to high risk pathology. Pt requires port placement for chemotherapy. Risk include bleeding, infection, pneumothorax, hemothorax, mediastinal injury, nerve injury , blood vessel injury, strke, blood clots, death, migration. embolization and need for additional procedures. Pt agrees to  proceed.  Current Plans Use of a central venous catheter for intravenous therapy was discussed. Technique of catheter placement using ultrasound and fluoroscopy guidance was discussed. Risks such as bleeding, infection, pneumothorax, catheter occlusion, reoperation, and other risks were discussed. I noted a good likelihood this will help address the problem. Questions were answered. The patient expressed understanding & wishes to proceed. Pt Education - [facility] Free Text Education/Instructions: discussed with patient and provided information.

## 2018-09-24 NOTE — Progress Notes (Signed)
Spoke with patient over the phone.  She advised that she will be coming in today for labs, EKG.  She states unable to get here until 1600 or so.

## 2018-09-24 NOTE — Progress Notes (Signed)
UPBEAT: It was confirmed with patient that she is able to walk at least 2 blocks without chest pain, dyspnea, shortness of breath or fainting and is able to exercise on a treadmill or stationary cycle. Patient had an ECHO completed on 09/18/18 resulting with LV EFT at 55%-60%. Patient with no history of pulmonary hypertension, acute pulmonary embolus, DVT or heart issues. Second nurse eligibility verification by Doreatha Martin, RN. Patient meets all inclusion/exclusion requirements and will be registered to study today.  Patient with appointment this afternoon to complete baseline assessments.  Maxwell Marion, RN, BSN, [facility] Clinical Research 09/24/2018 11:03 AM

## 2018-09-24 NOTE — Progress Notes (Signed)
CBC with Diff and BMP drawn per orders. EKG within the one year time frame. Pt stated that she already CHG soap at home did not need more at this time. Reviewed instructions for her pre-surgical ensure and that she needs to have it finished by 0930 DOS.

## 2018-09-24 NOTE — Progress Notes (Signed)
UPBEAT: 09/24/2018 Baseline Assessments I met with patient and her sister here in clinic this morning for completion of her baseline assessments. Patient sister left and to pick up patient at the completion of assessments. I reviewed with patient her list of concomitant medications, stop and start dates as well as the indication as to why she is taking them. I also reviewed her medical history and dates of diagnosis's. Research nurse in training , Arthur, assisted with today's assessment's.  Patient completed study neurocognitive assessment with research assistant Farris Has and then patient proceeded to complete study questionnaires on her own. We were unable to complete the Cardiac MRI today due to the MRI department having several emergency cases that were priority. Patient has been rescheduled to complete her cardiac MRI at 0700 on 09/25/2018. Patient asked if she could also complete the physical function assessment in the morning after the Cardiac MRI because her daughter called and needed her to come home. Patient will complete the baseline cardiac MRI and physical function tests tomorrow morning. Patient knows to take her anxiolytic medication when here in clinic and before her cardiac MRI. Patient thanked for her time today and contribution to study. I reconfirmed her morning appointments and she knows that I will meet with her in the morning after her cardiac MRI is complete.  Maxwell Marion, RN, BSN, [facility] Clinical Research 09/24/2018 2:09 PM   09/25/2018 Completion of Baseline Assessments  I met with patient in clinic this morning, who is here alone, after speaking with the cardiac MRI department and was informed that patient had forgotten to bring her anxiolytic medication and could not complete the baseline cardiac MRI. Patient apologized profusely for forgetting her medication and inability to complete this portion of the assesment. I inquired with patient if she was still  interested in continuing with the study and completing the rest of the baseline assessments which were the physical function assessments and patient stated that she was. I also asked patient if she thinks she would be able to complete the future cardiac MRI assessments, patient stated she would be, with the help of the anxiolytics. We completed the physical function assessment, with the assistance of research nurse in-training Johney Maine, RN. Patient's waist measured at 39.5" and second nurse verification by [facility]. Patient was informed that tomorrow morning we will collect her baseline labs and Delana Meyer will meet with her in the morning, just prior to her MD appointment to collect the height, weight, and two resting B/P's and heart rates. Patient gave verbal understanding. Patient was thanked for her time and contribution to study and encouraged to call with any questions or concerns she may have in the meantime.  Study informed of patient's inability to complete baseline cardiac MRI.  Maxwell Marion, RN, BSN, [facility] Clinical Research 09/25/2018 9:59 AM

## 2018-09-25 ENCOUNTER — Inpatient Hospital Stay (HOSPITAL_COMMUNITY): Admission: RE | Admit: 2018-09-25 | Payer: BLUE CROSS/BLUE SHIELD | Source: Ambulatory Visit

## 2018-09-25 ENCOUNTER — Telehealth: Payer: Self-pay | Admitting: Medical Oncology

## 2018-09-25 ENCOUNTER — Ambulatory Visit (HOSPITAL_BASED_OUTPATIENT_CLINIC_OR_DEPARTMENT_OTHER): Payer: BLUE CROSS/BLUE SHIELD | Admitting: Anesthesiology

## 2018-09-25 ENCOUNTER — Ambulatory Visit (HOSPITAL_COMMUNITY)
Admission: RE | Admit: 2018-09-25 | Discharge: 2018-09-25 | Disposition: A | Discharge status: Home / Self Care | Payer: BLUE CROSS/BLUE SHIELD | Source: Ambulatory Visit | Attending: Hematology and Oncology | Admitting: Hematology and Oncology

## 2018-09-25 ENCOUNTER — Encounter (HOSPITAL_BASED_OUTPATIENT_CLINIC_OR_DEPARTMENT_OTHER)
Admission: RE | Disposition: A | Discharge status: Home / Self Care | Payer: Self-pay | Source: Ambulatory Visit | Attending: Surgery

## 2018-09-25 ENCOUNTER — Ambulatory Visit (HOSPITAL_COMMUNITY): Payer: BLUE CROSS/BLUE SHIELD

## 2018-09-25 ENCOUNTER — Ambulatory Visit: Payer: Managed Care, Other (non HMO) | Admitting: Hematology and Oncology

## 2018-09-25 ENCOUNTER — Other Ambulatory Visit: Payer: Managed Care, Other (non HMO)

## 2018-09-25 ENCOUNTER — Encounter (HOSPITAL_BASED_OUTPATIENT_CLINIC_OR_DEPARTMENT_OTHER): Payer: Self-pay | Admitting: Certified Registered Nurse Anesthetist

## 2018-09-25 ENCOUNTER — Ambulatory Visit: Payer: Managed Care, Other (non HMO)

## 2018-09-25 ENCOUNTER — Ambulatory Visit (HOSPITAL_BASED_OUTPATIENT_CLINIC_OR_DEPARTMENT_OTHER)
Admission: RE | Admit: 2018-09-25 | Discharge: 2018-09-25 | Disposition: A | Discharge status: Home / Self Care | Payer: BLUE CROSS/BLUE SHIELD | Source: Ambulatory Visit | Attending: Surgery | Admitting: Surgery

## 2018-09-25 DIAGNOSIS — C50412 Malignant neoplasm of upper-outer quadrant of left female breast: Secondary | ICD-10-CM | POA: Insufficient documentation

## 2018-09-25 DIAGNOSIS — Z6835 Body mass index (BMI) 35.0-35.9, adult: Secondary | ICD-10-CM | POA: Insufficient documentation

## 2018-09-25 DIAGNOSIS — Z803 Family history of malignant neoplasm of breast: Secondary | ICD-10-CM | POA: Insufficient documentation

## 2018-09-25 DIAGNOSIS — Z419 Encounter for procedure for purposes other than remedying health state, unspecified: Secondary | ICD-10-CM

## 2018-09-25 DIAGNOSIS — Z170 Estrogen receptor positive status [ER+]: Secondary | ICD-10-CM | POA: Insufficient documentation

## 2018-09-25 DIAGNOSIS — M069 Rheumatoid arthritis, unspecified: Secondary | ICD-10-CM | POA: Diagnosis not present

## 2018-09-25 DIAGNOSIS — Z95828 Presence of other vascular implants and grafts: Secondary | ICD-10-CM

## 2018-09-25 DIAGNOSIS — Z96653 Presence of artificial knee joint, bilateral: Secondary | ICD-10-CM | POA: Diagnosis not present

## 2018-09-25 DIAGNOSIS — Z79899 Other long term (current) drug therapy: Secondary | ICD-10-CM | POA: Insufficient documentation

## 2018-09-25 DIAGNOSIS — I10 Essential (primary) hypertension: Secondary | ICD-10-CM | POA: Insufficient documentation

## 2018-09-25 DIAGNOSIS — Z87891 Personal history of nicotine dependence: Secondary | ICD-10-CM | POA: Insufficient documentation

## 2018-09-25 DIAGNOSIS — Z79890 Hormone replacement therapy (postmenopausal): Secondary | ICD-10-CM | POA: Diagnosis not present

## 2018-09-25 DIAGNOSIS — E039 Hypothyroidism, unspecified: Secondary | ICD-10-CM | POA: Insufficient documentation

## 2018-09-25 DIAGNOSIS — Z950 Presence of cardiac pacemaker: Secondary | ICD-10-CM | POA: Diagnosis not present

## 2018-09-25 DIAGNOSIS — Z452 Encounter for adjustment and management of vascular access device: Secondary | ICD-10-CM | POA: Diagnosis not present

## 2018-09-25 DIAGNOSIS — C50912 Malignant neoplasm of unspecified site of left female breast: Secondary | ICD-10-CM | POA: Diagnosis not present

## 2018-09-25 DIAGNOSIS — Z9013 Acquired absence of bilateral breasts and nipples: Secondary | ICD-10-CM | POA: Insufficient documentation

## 2018-09-25 DIAGNOSIS — C50919 Malignant neoplasm of unspecified site of unspecified female breast: Secondary | ICD-10-CM

## 2018-09-25 HISTORY — PX: PORTACATH PLACEMENT: SHX2246

## 2018-09-25 SURGERY — INSERTION, TUNNELED CENTRAL VENOUS DEVICE, WITH PORT
Anesthesia: General | Site: Chest | Laterality: Right

## 2018-09-25 MED ORDER — CHLORHEXIDINE GLUCONATE CLOTH 2 % EX PADS: 6.0000 | MEDICATED_PAD | Freq: Once | CUTANEOUS | Status: DC

## 2018-09-25 MED ORDER — HEPARIN (PORCINE) IN NACL 1000-0.9 UT/500ML-% IV SOLN
INTRAVENOUS | Status: AC
  Filled 2018-09-25: qty 500

## 2018-09-25 MED ORDER — MEPERIDINE HCL 25 MG/ML IJ SOLN: 6.2500 mg | INTRAMUSCULAR | Status: DC | PRN

## 2018-09-25 MED ORDER — HEPARIN (PORCINE) IN NACL 2-0.9 UNITS/ML
INTRAMUSCULAR | Status: AC | PRN
  Administered 2018-09-25: 10 mL via INTRAVENOUS

## 2018-09-25 MED ORDER — FENTANYL CITRATE (PF) 100 MCG/2ML IJ SOLN
INTRAMUSCULAR | Status: DC | PRN
  Administered 2018-09-25 (×2): 50 ug via INTRAVENOUS

## 2018-09-25 MED ORDER — BUPIVACAINE-EPINEPHRINE (PF) 0.25% -1:200000 IJ SOLN
INTRAMUSCULAR | Status: AC
  Filled 2018-09-25: qty 30

## 2018-09-25 MED ORDER — ALBUTEROL SULFATE HFA 108 (90 BASE) MCG/ACT IN AERS
INHALATION_SPRAY | RESPIRATORY_TRACT | Status: AC
  Filled 2018-09-25: qty 6.7

## 2018-09-25 MED ORDER — FENTANYL CITRATE (PF) 100 MCG/2ML IJ SOLN
INTRAMUSCULAR | Status: AC
  Filled 2018-09-25: qty 2

## 2018-09-25 MED ORDER — LIDOCAINE 2% (20 MG/ML) 5 ML SYRINGE
INTRAMUSCULAR | Status: AC
  Filled 2018-09-25: qty 5

## 2018-09-25 MED ORDER — CELECOXIB 200 MG PO CAPS
200.0000 mg | ORAL_CAPSULE | ORAL | Status: AC
  Administered 2018-09-25: 200 mg via ORAL

## 2018-09-25 MED ORDER — MIDAZOLAM HCL 2 MG/2ML IJ SOLN
INTRAMUSCULAR | Status: AC
  Filled 2018-09-25: qty 2

## 2018-09-25 MED ORDER — ACETAMINOPHEN 160 MG/5ML PO SOLN: 325.0000 mg | ORAL | Status: DC | PRN

## 2018-09-25 MED ORDER — SCOPOLAMINE 1 MG/3DAYS TD PT72
1.0000 | MEDICATED_PATCH | Freq: Once | TRANSDERMAL | Status: AC | PRN
  Administered 2018-09-25: 1 via TRANSDERMAL

## 2018-09-25 MED ORDER — CEFAZOLIN SODIUM-DEXTROSE 2-4 GM/100ML-% IV SOLN
INTRAVENOUS | Status: AC
  Filled 2018-09-25: qty 100

## 2018-09-25 MED ORDER — MIDAZOLAM HCL 2 MG/2ML IJ SOLN: 1.0000 mg | INTRAMUSCULAR | Status: DC | PRN

## 2018-09-25 MED ORDER — DEXTROSE 5 % IV SOLN
3.0000 g | INTRAVENOUS | Status: AC
  Administered 2018-09-25: 2 g via INTRAVENOUS

## 2018-09-25 MED ORDER — OXYCODONE HCL 5 MG PO TABS: 5.0000 mg | ORAL_TABLET | Freq: Four times a day (QID) | ORAL | 0 refills | Status: DC | PRN

## 2018-09-25 MED ORDER — GABAPENTIN 300 MG PO CAPS
ORAL_CAPSULE | ORAL | Status: AC
  Filled 2018-09-25: qty 1

## 2018-09-25 MED ORDER — ONDANSETRON HCL 4 MG/2ML IJ SOLN: 4.0000 mg | Freq: Once | INTRAMUSCULAR | Status: DC | PRN

## 2018-09-25 MED ORDER — MIDAZOLAM HCL 2 MG/2ML IJ SOLN
INTRAMUSCULAR | Status: DC | PRN
  Administered 2018-09-25: 2 mg via INTRAVENOUS

## 2018-09-25 MED ORDER — CELECOXIB 200 MG PO CAPS
ORAL_CAPSULE | ORAL | Status: AC
  Filled 2018-09-25: qty 1

## 2018-09-25 MED ORDER — DEXAMETHASONE SODIUM PHOSPHATE 10 MG/ML IJ SOLN
INTRAMUSCULAR | Status: DC | PRN
  Administered 2018-09-25: 8 mg via INTRAVENOUS

## 2018-09-25 MED ORDER — ONDANSETRON HCL 4 MG/2ML IJ SOLN
INTRAMUSCULAR | Status: DC | PRN
  Administered 2018-09-25: 4 mg via INTRAVENOUS

## 2018-09-25 MED ORDER — FENTANYL CITRATE (PF) 100 MCG/2ML IJ SOLN
25.0000 ug | INTRAMUSCULAR | Status: DC | PRN
  Administered 2018-09-25: 25 ug via INTRAVENOUS

## 2018-09-25 MED ORDER — HEPARIN SOD (PORK) LOCK FLUSH 100 UNIT/ML IV SOLN
INTRAVENOUS | Status: AC
  Filled 2018-09-25: qty 5

## 2018-09-25 MED ORDER — OXYCODONE HCL 5 MG PO TABS: 5.0000 mg | ORAL_TABLET | Freq: Once | ORAL | Status: DC | PRN

## 2018-09-25 MED ORDER — GABAPENTIN 300 MG PO CAPS
300.0000 mg | ORAL_CAPSULE | ORAL | Status: AC
  Administered 2018-09-25: 300 mg via ORAL

## 2018-09-25 MED ORDER — ALBUTEROL SULFATE HFA 108 (90 BASE) MCG/ACT IN AERS
INHALATION_SPRAY | RESPIRATORY_TRACT | Status: DC | PRN
  Administered 2018-09-25: 6 via RESPIRATORY_TRACT

## 2018-09-25 MED ORDER — BUPIVACAINE-EPINEPHRINE 0.25% -1:200000 IJ SOLN
INTRAMUSCULAR | Status: DC | PRN
  Administered 2018-09-25: 10 mL

## 2018-09-25 MED ORDER — PROPOFOL 10 MG/ML IV BOLUS
INTRAVENOUS | Status: DC | PRN
  Administered 2018-09-25: 160 mg via INTRAVENOUS

## 2018-09-25 MED ORDER — HEPARIN SOD (PORK) LOCK FLUSH 100 UNIT/ML IV SOLN
INTRAVENOUS | Status: DC | PRN
  Administered 2018-09-25: 500 [IU] via INTRAVENOUS

## 2018-09-25 MED ORDER — ACETAMINOPHEN 325 MG PO TABS: 325.0000 mg | ORAL_TABLET | ORAL | Status: DC | PRN

## 2018-09-25 MED ORDER — SCOPOLAMINE 1 MG/3DAYS TD PT72
MEDICATED_PATCH | TRANSDERMAL | Status: AC
  Filled 2018-09-25: qty 1

## 2018-09-25 MED ORDER — LIDOCAINE HCL (CARDIAC) PF 100 MG/5ML IV SOSY
PREFILLED_SYRINGE | INTRAVENOUS | Status: DC | PRN
  Administered 2018-09-25: 70 mg via INTRAVENOUS

## 2018-09-25 MED ORDER — OXYCODONE HCL 5 MG/5ML PO SOLN: 5.0000 mg | Freq: Once | ORAL | Status: DC | PRN

## 2018-09-25 MED ORDER — PROPOFOL 10 MG/ML IV BOLUS
INTRAVENOUS | Status: AC
  Filled 2018-09-25: qty 20

## 2018-09-25 MED ORDER — IOPAMIDOL (ISOVUE-300) INJECTION 61%
INTRAVENOUS | Status: AC
  Filled 2018-09-25: qty 50

## 2018-09-25 MED ORDER — LACTATED RINGERS IV SOLN
INTRAVENOUS | Status: DC
  Administered 2018-09-25: 12:00:00 via INTRAVENOUS

## 2018-09-25 MED ORDER — FENTANYL CITRATE (PF) 100 MCG/2ML IJ SOLN: 50.0000 ug | INTRAMUSCULAR | Status: DC | PRN

## 2018-09-25 SURGICAL SUPPLY — 60 items
BAG DECANTER FOR FLEXI CONT (MISCELLANEOUS) ×2 IMPLANT
BENZOIN TINCTURE PRP APPL 2/3 (GAUZE/BANDAGES/DRESSINGS) IMPLANT
BLADE HEX COATED 2.75 (ELECTRODE) ×2 IMPLANT
BLADE SURG 11 STRL SS (BLADE) ×2 IMPLANT
BLADE SURG 15 STRL LF DISP TIS (BLADE) ×1 IMPLANT
BLADE SURG 15 STRL SS (BLADE) ×1
CANISTER SUCT 1200ML W/VALVE (MISCELLANEOUS) IMPLANT
CHLORAPREP W/TINT 26ML (MISCELLANEOUS) ×2 IMPLANT
COVER BACK TABLE 60X90IN (DRAPES) ×2 IMPLANT
COVER MAYO STAND STRL (DRAPES) ×2 IMPLANT
COVER PROBE 5X48 (MISCELLANEOUS) ×1
COVER WAND RF STERILE (DRAPES) IMPLANT
DECANTER SPIKE VIAL GLASS SM (MISCELLANEOUS) IMPLANT
DERMABOND ADVANCED (GAUZE/BANDAGES/DRESSINGS) ×1
DERMABOND ADVANCED .7 DNX12 (GAUZE/BANDAGES/DRESSINGS) ×1 IMPLANT
DRAPE C-ARM 42X72 X-RAY (DRAPES) ×2 IMPLANT
DRAPE LAPAROSCOPIC ABDOMINAL (DRAPES) ×2 IMPLANT
DRAPE UTILITY XL STRL (DRAPES) ×2 IMPLANT
DRSG TEGADERM 2-3/8X2-3/4 SM (GAUZE/BANDAGES/DRESSINGS) ×1 IMPLANT
DRSG TEGADERM 4X4.75 (GAUZE/BANDAGES/DRESSINGS) IMPLANT
ELECT REM PT RETURN 9FT ADLT (ELECTROSURGICAL) ×2
ELECTRODE REM PT RTRN 9FT ADLT (ELECTROSURGICAL) ×1 IMPLANT
GAUZE SPONGE 4X4 12PLY STRL LF (GAUZE/BANDAGES/DRESSINGS) IMPLANT
GLOVE BIOGEL PI IND STRL 8 (GLOVE) ×1 IMPLANT
GLOVE BIOGEL PI INDICATOR 8 (GLOVE)
GLOVE ECLIPSE 8.0 STRL XLNG CF (GLOVE) ×1 IMPLANT
GLOVE SURG SS PI 6.5 STRL IVOR (GLOVE) ×1 IMPLANT
GLOVE SURG SS PI 7.0 STRL IVOR (GLOVE) ×1 IMPLANT
GLOVE SURG SS PI 8.0 STRL IVOR (GLOVE) ×1 IMPLANT
GOWN STRL REUS W/ TWL LRG LVL3 (GOWN DISPOSABLE) ×2 IMPLANT
GOWN STRL REUS W/TWL LRG LVL3 (GOWN DISPOSABLE) ×2
IV KIT MINILOC 20X1 SAFETY (NEEDLE) IMPLANT
KIT CVR 48X5XPRB PLUP LF (MISCELLANEOUS) ×1 IMPLANT
KIT PORT POWER 8FR ISP CVUE (Port) ×1 IMPLANT
NDL HYPO 25X1 1.5 SAFETY (NEEDLE) ×1 IMPLANT
NDL SAFETY ECLIPSE 18X1.5 (NEEDLE) IMPLANT
NDL SPNL 22GX3.5 QUINCKE BK (NEEDLE) IMPLANT
NEEDLE HYPO 18GX1.5 SHARP (NEEDLE)
NEEDLE HYPO 22GX1.5 SAFETY (NEEDLE) IMPLANT
NEEDLE HYPO 25X1 1.5 SAFETY (NEEDLE) ×2 IMPLANT
NEEDLE SPNL 22GX3.5 QUINCKE BK (NEEDLE) IMPLANT
PACK BASIN DAY SURGERY FS (CUSTOM PROCEDURE TRAY) ×2 IMPLANT
PENCIL BUTTON HOLSTER BLD 10FT (ELECTRODE) ×2 IMPLANT
SET SHEATH INTRODUCER 10FR (MISCELLANEOUS) IMPLANT
SHEATH COOK PEEL AWAY SET 9F (SHEATH) IMPLANT
SLEEVE SCD COMPRESS KNEE MED (MISCELLANEOUS) ×1 IMPLANT
SPONGE LAP 4X18 RFD (DISPOSABLE) IMPLANT
STRIP CLOSURE SKIN 1/2X4 (GAUZE/BANDAGES/DRESSINGS) IMPLANT
SUT MON AB 4-0 PC3 18 (SUTURE) ×2 IMPLANT
SUT PROLENE 2 0 CT2 30 (SUTURE) IMPLANT
SUT PROLENE 2 0 SH DA (SUTURE) ×2 IMPLANT
SUT SILK 2 0 TIES 17X18 (SUTURE)
SUT SILK 2-0 18XBRD TIE BLK (SUTURE) IMPLANT
SUT VICRYL 3-0 CR8 SH (SUTURE) ×2 IMPLANT
SYR 5ML LUER SLIP (SYRINGE) ×2 IMPLANT
SYR CONTROL 10ML LL (SYRINGE) ×2 IMPLANT
TOWEL GREEN STERILE FF (TOWEL DISPOSABLE) ×4 IMPLANT
TOWEL OR NON WOVEN STRL DISP B (DISPOSABLE) ×2 IMPLANT
TUBE CONNECTING 20X1/4 (TUBING) IMPLANT
YANKAUER SUCT BULB TIP NO VENT (SUCTIONS) IMPLANT

## 2018-09-25 NOTE — Anesthesia Postprocedure Evaluation (Signed)
Anesthesia Post Note  Patient: Paula Jacobs  Procedure(s) Performed: INSERTION PORT-A-CATH WITH ULTRASOUND ERAS PATHWAY (Right Chest)     Patient location during evaluation: PACU Anesthesia Type: General Level of consciousness: awake and alert Pain management: pain level controlled Vital Signs Assessment: post-procedure vital signs reviewed and stable Respiratory status: spontaneous breathing, nonlabored ventilation, respiratory function stable and patient connected to nasal cannula oxygen Cardiovascular status: blood pressure returned to baseline and stable Postop Assessment: no apparent nausea or vomiting Anesthetic complications: no    Last Vitals:  Vitals:   09/25/18 1400 09/25/18 1430  BP: 124/84 123/75  Pulse: 79 85  Resp: 14 18  Temp:  37 C  SpO2: 98% 97%    Last Pain:  Vitals:   09/25/18 1430  TempSrc:   PainSc: 4                  Jonas Goh

## 2018-09-25 NOTE — Telephone Encounter (Signed)
UPBEAT I called patient to remind her to fast at least three hours prior to her lab appointment tomorrow morning at 0800, for the study baseline labs. Patient confirmed understanding. Denies any questions.  Patient thanked. Maxwell Marion, RN, BSN, [facility] Clinical Research 09/25/2018 4:24 PM

## 2018-09-25 NOTE — Discharge Instructions (Signed)

## 2018-09-25 NOTE — Interval H&P Note (Signed)
History and Physical Interval Note:  09/25/2018 11:40 AM  Paula Jacobs  has presented today for surgery, with the diagnosis of Poor venous access  The various methods of treatment have been discussed with the patient and family. After consideration of risks, benefits and other options for treatment, the patient has consented to  Procedure(s): [place] (N/A) as a surgical intervention .  The patient's history has been reviewed, patient examined, no change in status, stable for surgery.  I have reviewed the patient's chart and labs.  Questions were answered to the patient's satisfaction.     [place]

## 2018-09-25 NOTE — Op Note (Signed)
Preoperative diagnosis: PAC needed  Postoperative diagnosis: Same  Procedure: Portacath Placement with C arm and U/S guidance   Surgeon: Turner Daniels, MD, FACS  Anesthesia: General and 0.25 % marcaine with epinephrine  Clinical History and Indications: The patient is getting ready to begin chemotherapy for her cancer. She  needs a Port-A-Cath for venous access. Risk of bleeding, infection,  Collapse lung,  Death,  DVT,  Organ injury,  Mediastinal injury,  Injury to heart,  Injury to blood vessels,  Nerves,  Migration of catheter,  Embolization of catheter and the need for more surgery.  Description of Procedure: I have seen the patient in the holding area and confirmed the plans for the procedure as noted above. I reviewed the risks and complications again and the patient has no further questions. She wishes to proceed.   The patient was then taken to the operating room. After satisfactory general  anesthesia had been obtained the upper chest and lower neck were prepped and draped as a sterile field. The timeout was done.  The right internal jugular vein  was entered under U/S guidance  and the guidewire threaded into the superior vena cava right atrial area under fluoroscopic guidance. An incision was then made on the anterior chest wall and a subcutaneous pocket fashioned for the port reservoir.  The port tubing was then brought through a subcutaneous tunnel from the port site to the guidewire site.  The port and catheter were attached, locked  and flushed. The catheter was measured and cut to appropriate length.The dilator and peel-away sheath were then advanced over the guidewire while monitoring this with fluoroscopy. The guidewire and dilator were removed and the tubing threaded to approximately 22 cm. The peel-away sheath was then removed. The catheter aspirated and flushed easily. Using fluoroscopy the tip was in the superior vena cava right atrial junction area. It aspirated and  flushed easily. That aspirated and flushed easily.  The reservoir was secured to the fascia with 1 sutures of 2-0 Prolene. A final check with fluoroscopy was done to make sure we had no kinks and good positioning of the tip of the catheter. Everything appeared to be okay. The catheter was aspirated, flushed with dilute heparin and then concentrated aqueous heparin.  The incision was then closed with interrupted 3-0 Vicryl, and 4-0 Monocryl subcuticular with Dermabond on the skin.  There were no operative complications. Estimated blood loss was minimal. All counts were correct. The patient tolerated the procedure well.  Turner Daniels, MD, FACS

## 2018-09-25 NOTE — Transfer of Care (Signed)
Immediate Anesthesia Transfer of Care Note  Patient: Paula Jacobs  Procedure(s) Performed: INSERTION PORT-A-CATH WITH ULTRASOUND ERAS PATHWAY (Right Chest)  Patient Location: PACU  Anesthesia Type:General  Level of Consciousness: awake, alert , oriented and patient cooperative  Airway & Oxygen Therapy: Patient Spontanous Breathing and Patient connected to face mask oxygen  Post-op Assessment: Report given to RN and Post -op Vital signs reviewed and stable  Post vital signs: Reviewed and stable  Last Vitals:  Vitals Value Taken Time  BP 144/86 09/25/2018  1:19 PM  Temp    Pulse 101 09/25/2018  1:21 PM  Resp 18 09/25/2018  1:21 PM  SpO2 100 % 09/25/2018  1:21 PM  Vitals shown include unvalidated device data.  Last Pain:  Vitals:   09/25/18 1209  TempSrc: Oral  PainSc: 0-No pain         Complications: No apparent anesthesia complications

## 2018-09-25 NOTE — Progress Notes (Signed)
Patient Care Team: [name], DO as PCP - General (Family Medicine) Hurley Cisco, MD as Consulting Physician (Rheumatology) [name], MD as Consulting Physician (Orthopedic Surgery) Erroll Luna, MD as Consulting Physician (General Surgery) Nicholas Lose, MD as Consulting Physician (Hematology and Oncology) Kyung Rudd, MD as Consulting Physician (Radiation Oncology) Maxwell Marion, RN as Registered Nurse (Medical Oncology)  DIAGNOSIS:    ICD-10-CM   1. Malignant neoplasm of upper-outer quadrant of left breast in female, estrogen receptor positive ([place]) C50.412    Z17.0     SUMMARY OF ONCOLOGIC HISTORY:   Malignant neoplasm of upper-outer quadrant of left breast in female, estrogen receptor positive ([place])   07/13/2018 Initial Diagnosis     Palpable left breast mass, 2 adjacent masses at 2 o'clock position 2 cm biopsy IDC grade 2-3, Ig DCIS, ER 95%, PR 90%, Ki-67 30%, HER-2 -1+; 2:30 position intramammary lymph node 1.3 cm biopsy positive; left axillary lymph node biopsy negative discordant, T2N1, stage IIa    07/25/2018 Cancer Staging    Staging form: Breast, AJCC 8th Edition - Clinical: Stage IIA (cT1c, cN1, cM0, G3, ER+, PR+, HER2-) - Signed by Nicholas Lose, MD on 07/25/2018    08/21/2018 Surgery    Bilateral mastectomies: Left mastectomy: IDC grade 3, 2.1 cm, high-grade DCIS, margins negative, negative for LV I, 3/9 lymph nodes positive, ER 95% positive, PR 90% positive, HER-2 -1+, Ki-67 30% T2N1A stage IIa right mastectomy: Benign    09/03/2018 Cancer Staging    Staging form: Breast, AJCC 8th Edition - Pathologic: Stage IIA (pT2, pN1a, cM0, G3, ER+, PR+, HER2-) - Signed by Nicholas Lose, MD on 09/03/2018    09/03/2018 Miscellaneous    MammaPrint: High risk luminal type B, average 10-year risk of recurrence untreated 29%, predicted 5-year benefit of chemotherapy 94.6% distant metastasis free interval    09/26/2018 -  Chemotherapy    Adjuvant chemotherapy  with dose dense Adriamycin and Cytoxan x4 followed by weekly Taxol x12     CHIEF COMPLIANT: Cycle 1 Adriamycin and Cytoxan  INTERVAL HISTORY: Paula Jacobs is a 58 y.o. with above-mentioned history of left breast cancer who underwent bilateral mastectomies. Her port was inserted on 09/25/18 by Dr. Brantley Stage. She presents to the clinic today with a friend for cycle 1 of dose dense Adriamycin and Cytoxan. She reports insomnia for the last 3-4 years, where she sleeps only a few hours each night, and is worried about chemotherapy affecting her sleeping. She reports that since her surgery she has had cravings for ice chips and was not surprised to see her blood work for today showed she is anemic.  REVIEW OF SYSTEMS:   Constitutional: Denies fevers, chills or abnormal weight loss (+) cravings for ice  Eyes: Denies blurriness of vision Ears, nose, mouth, throat, and face: Denies mucositis or sore throat Respiratory: Denies cough, dyspnea or wheezes Cardiovascular: Denies palpitation, chest discomfort Gastrointestinal:  Denies nausea, heartburn or change in bowel habits Skin: Denies abnormal skin rashes Lymphatics: Denies new lymphadenopathy or easy bruising Neurological:Denies numbness, tingling or new weaknesses Behavioral/Psych: Mood is stable, no new changes (+) difficulty sleeping  Extremities: No lower extremity edema Breast: denies any pain or lumps or nodules in either breasts All other systems were reviewed with the patient and are negative.  I have reviewed the past medical history, past surgical history, social history and family history with the patient and they are unchanged from previous note.  ALLERGIES:  is allergic to hydrocodone-acetaminophen; latex; and tramadol hcl.  MEDICATIONS:  Current Outpatient Medications  Medication Sig Dispense Refill  . acetaminophen (TYLENOL) 500 MG tablet Take 1,000 mg by mouth every 6 (six) hours as needed for moderate pain.    .  benazepril-hydrochlorthiazide (LOTENSIN HCT) 10-12.5 MG tablet TAKE 1 TABLET BY MOUTH DAILY 90 tablet 0  . dexamethasone (DECADRON) 4 MG tablet Take 1 tablet day after chemo and 1 tablet 2 days after chemo with food 30 tablet 1  . ibuprofen (ADVIL,MOTRIN) 800 MG tablet Take 1 tablet (800 mg total) by mouth every 8 (eight) hours as needed. 30 tablet 0  . levothyroxine (SYNTHROID, LEVOTHROID) 175 MCG tablet TAKE 1 TABLET BY MOUTH EVERY MORNING (Patient taking differently: Take 175 mcg by mouth daily before breakfast. ) 30 tablet 5  . lidocaine-prilocaine (EMLA) cream Apply to affected area once 30 g 3  . LORazepam (ATIVAN) 0.5 MG tablet Take 1 tablet (0.5 mg total) by mouth at bedtime as needed for sleep. 30 tablet 0  . ondansetron (ZOFRAN) 8 MG tablet Take 1 tablet (8 mg total) by mouth 2 (two) times daily as needed. Start on the third day after chemotherapy. 30 tablet 1  . oxyCODONE (OXY IR/ROXICODONE) 5 MG immediate release tablet Take 1 tablet (5 mg total) by mouth every 6 (six) hours as needed for severe pain. 10 tablet 0  . prochlorperazine (COMPAZINE) 10 MG tablet Take 1 tablet (10 mg total) by mouth every 6 (six) hours as needed (Nausea or vomiting). 30 tablet 1   No current facility-administered medications for this visit.     PHYSICAL EXAMINATION: ECOG PERFORMANCE STATUS: 1 - Symptomatic but completely ambulatory  Vitals:   09/26/18 0829  BP: 127/87  Pulse: 73  Resp: 14  Temp: 98 F (36.7 C)  SpO2: 98%   Filed Weights   09/26/18 0829  Weight: 195 lb 4.8 oz (88.6 kg)    GENERAL:alert, no distress and comfortable SKIN: skin color, texture, turgor are normal, no rashes or significant lesions EYES: normal, Conjunctiva are pink and non-injected, sclera clear OROPHARYNX:no exudate, no erythema and lips, buccal mucosa, and tongue normal  NECK: supple, thyroid normal size, non-tender, without nodularity LYMPH:  no palpable lymphadenopathy in the cervical, axillary or  inguinal LUNGS: clear to auscultation and percussion with normal breathing effort HEART: regular rate & rhythm and no murmurs and no lower extremity edema ABDOMEN:abdomen soft, non-tender and normal bowel sounds MUSCULOSKELETAL:no cyanosis of digits and no clubbing  NEURO: alert & oriented x 3 with fluent speech, no focal motor/sensory deficits EXTREMITIES: No lower extremity edema  LABORATORY DATA:  I have reviewed the data as listed CMP Latest Ref Rng & Units 09/24/2018 08/22/2018 08/21/2018  Glucose 70 - 99 mg/dL 113(H) 127(H) -  BUN 6 - 20 mg/dL 14 16 -  Creatinine 0.44 - 1.00 mg/dL 0.82 1.03(H) 0.92  Sodium 135 - 145 mmol/L 138 137 -  Potassium 3.5 - 5.1 mmol/L 3.4(L) 4.5 -  Chloride 98 - 111 mmol/L 102 102 -  CO2 22 - 32 mmol/L 27 25 -  Calcium 8.9 - 10.3 mg/dL 9.3 8.1(L) -  Total Protein 6.5 - 8.1 g/dL 7.0 5.4(L) -  Total Bilirubin 0.3 - 1.2 mg/dL 0.5 0.3 -  Alkaline Phos 38 - 126 U/L 48 35(L) -  AST 15 - 41 U/L 17 18 -  ALT 0 - 44 U/L 16 12 -    Lab Results  Component Value Date   WBC 11.4 (H) 09/26/2018   HGB 10.6 (L) 09/26/2018   HCT  34.0 (L) 09/26/2018   MCV 87.2 09/26/2018   PLT 428 (H) 09/26/2018   NEUTROABS 8.9 (H) 09/26/2018    ASSESSMENT & PLAN:  Malignant neoplasm of upper-outer quadrant of left breast in female, estrogen receptor positive ([place]) 08/21/2018:Bilateral mastectomies: Left mastectomy: IDC grade 3, 2.1 cm, high-grade DCIS, margins negative, negative for LV I, 3/9 lymph nodes positive, ER 95% positive, PR 90% positive, HER-2 -1+, Ki-67 30% T2N1A stage IIa right mastectomy: Benign MammaPrint: High risk luminal type B Echocardiogram 09/18/2018: EF 55 to 60% Patient has been enrolled and upbeat clinical trial: No adverse effects related to the trial  Treatment plan: 1.  Adjuvant chemotherapy with dose dense Adriamycin and Cytoxan x4 followed by weekly Taxol x12 2.  Followed by adjuvant antiestrogen therapy  I reviewed her antiemetics Consent has been  obtained Labs reviewed Moderate anemia: Hemoglobin is 10.6: She has craving for ice.  I will add iron studies and ferritin.  Return to clinic in 1 week for toxicity check   No orders of the defined types were placed in this encounter.  The patient has a good understanding of the overall plan. she agrees with it. she will call with any problems that may develop before the next visit here.  Nicholas Lose, MD 09/26/2018   I, Cloyde Reams Dorshimer, am acting as scribe for Nicholas Lose, MD.  I have reviewed the above documentation for accuracy and completeness, and I agree with the above.

## 2018-09-25 NOTE — Anesthesia Procedure Notes (Signed)
Procedure Name: LMA Insertion Date/Time: 09/25/2018 12:20 PM Performed by: Raenette Rover, CRNA Pre-anesthesia Checklist: Patient identified, Emergency Drugs available, Suction available and Patient being monitored Patient Re-evaluated:Patient Re-evaluated prior to induction Oxygen Delivery Method: Circle system utilized Preoxygenation: Pre-oxygenation with 100% oxygen Induction Type: IV induction LMA: LMA inserted LMA Size: 4.0 Number of attempts: 1 Placement Confirmation: positive ETCO2,  CO2 detector and breath sounds checked- equal and bilateral Tube secured with: Tape Dental Injury: Teeth and Oropharynx as per pre-operative assessment

## 2018-09-25 NOTE — Anesthesia Preprocedure Evaluation (Signed)
Anesthesia Evaluation  Patient identified by MRN, date of birth, ID band Patient awake    Reviewed: Allergy & Precautions, NPO status , Patient's Chart, lab work & pertinent test results  History of Anesthesia Complications (+) PONV and history of anesthetic complications  Airway Mallampati: I  TM Distance: >3 FB Neck ROM: Full    Dental  (+) Missing,    Pulmonary former smoker,    breath sounds clear to auscultation       Cardiovascular hypertension, Pt. on medications  Rhythm:Regular Rate:Normal     Neuro/Psych PSYCHIATRIC DISORDERS Depression negative neurological ROS     GI/Hepatic negative GI ROS, Neg liver ROS,   Endo/Other  Hypothyroidism   Renal/GU negative Renal ROS  negative genitourinary   Musculoskeletal  (+) Arthritis , Rheumatoid disorders,    Abdominal (+) + obese,   Peds negative pediatric ROS (+)  Hematology  (+) Blood dyscrasia, anemia ,   Anesthesia Other Findings   Reproductive/Obstetrics negative OB ROS                             Lab Results  Component Value Date   WBC 8.7 09/24/2018   HGB 11.9 (L) 09/24/2018   HCT 39.4 09/24/2018   MCV 87.6 09/24/2018   PLT 451 (H) 09/24/2018   Lab Results  Component Value Date   CREATININE 0.82 09/24/2018   BUN 14 09/24/2018   NA 138 09/24/2018   K 3.4 (L) 09/24/2018   CL 102 09/24/2018   CO2 27 09/24/2018   Lab Results  Component Value Date   INR 1.07 10/15/2014   INR 1.0 03/24/2008   EKG: sinus rhythm, PAC's noted.   Anesthesia Physical  Anesthesia Plan  ASA: III  Anesthesia Plan: General   Post-op Pain Management: GA combined w/ Regional for post-op pain   Induction: Intravenous  PONV Risk Score and Plan: 4 or greater and Ondansetron, Dexamethasone, Scopolamine patch - Pre-op and Midazolam  Airway Management Planned: LMA  Additional Equipment: None  Intra-op Plan:   Post-operative Plan:  Extubation in OR  Informed Consent: I have reviewed the patients History and Physical, chart, labs and discussed the procedure including the risks, benefits and alternatives for the proposed anesthesia with the patient or authorized representative who has indicated his/her understanding and acceptance.   Dental advisory given  Plan Discussed with: CRNA, Anesthesiologist and Surgeon  Anesthesia Plan Comments: ( )        Anesthesia Quick Evaluation

## 2018-09-26 ENCOUNTER — Inpatient Hospital Stay: Payer: BLUE CROSS/BLUE SHIELD

## 2018-09-26 ENCOUNTER — Encounter (HOSPITAL_BASED_OUTPATIENT_CLINIC_OR_DEPARTMENT_OTHER): Payer: Self-pay | Admitting: Surgery

## 2018-09-26 ENCOUNTER — Inpatient Hospital Stay (HOSPITAL_BASED_OUTPATIENT_CLINIC_OR_DEPARTMENT_OTHER): Payer: BLUE CROSS/BLUE SHIELD | Admitting: Hematology and Oncology

## 2018-09-26 VITALS — BP 127/87 | HR 73 | Temp 98.0°F | Resp 14 | Ht 62.5 in | Wt 195.3 lb

## 2018-09-26 DIAGNOSIS — C50412 Malignant neoplasm of upper-outer quadrant of left female breast: Secondary | ICD-10-CM | POA: Insufficient documentation

## 2018-09-26 DIAGNOSIS — Z95828 Presence of other vascular implants and grafts: Secondary | ICD-10-CM | POA: Insufficient documentation

## 2018-09-26 DIAGNOSIS — D649 Anemia, unspecified: Secondary | ICD-10-CM

## 2018-09-26 DIAGNOSIS — Z9013 Acquired absence of bilateral breasts and nipples: Secondary | ICD-10-CM

## 2018-09-26 DIAGNOSIS — Z170 Estrogen receptor positive status [ER+]: Principal | ICD-10-CM

## 2018-09-26 DIAGNOSIS — G4700 Insomnia, unspecified: Secondary | ICD-10-CM

## 2018-09-26 DIAGNOSIS — Z5111 Encounter for antineoplastic chemotherapy: Secondary | ICD-10-CM | POA: Insufficient documentation

## 2018-09-26 DIAGNOSIS — Z5189 Encounter for other specified aftercare: Secondary | ICD-10-CM | POA: Insufficient documentation

## 2018-09-26 LAB — IRON AND TIBC
IRON: 45 ug/dL (ref 41–142)
Saturation Ratios: 13 % — ABNORMAL LOW (ref 21–57)
TIBC: 339 ug/dL (ref 236–444)
UIBC: 293 ug/dL (ref 120–384)

## 2018-09-26 LAB — CBC WITH DIFFERENTIAL (CANCER CENTER ONLY)
Abs Immature Granulocytes: 0.04 10*3/uL (ref 0.00–0.07)
Basophils Absolute: 0 10*3/uL (ref 0.0–0.1)
Basophils Relative: 0 %
EOS ABS: 0 10*3/uL (ref 0.0–0.5)
Eosinophils Relative: 0 %
HCT: 34 % — ABNORMAL LOW (ref 36.0–46.0)
Hemoglobin: 10.6 g/dL — ABNORMAL LOW (ref 12.0–15.0)
IMMATURE GRANULOCYTES: 0 %
Lymphocytes Relative: 15 %
Lymphs Abs: 1.8 10*3/uL (ref 0.7–4.0)
MCH: 27.2 pg (ref 26.0–34.0)
MCHC: 31.2 g/dL (ref 30.0–36.0)
MCV: 87.2 fL (ref 80.0–100.0)
Monocytes Absolute: 0.7 10*3/uL (ref 0.1–1.0)
Monocytes Relative: 6 %
Neutro Abs: 8.9 10*3/uL — ABNORMAL HIGH (ref 1.7–7.7)
Neutrophils Relative %: 79 %
Platelet Count: 428 10*3/uL — ABNORMAL HIGH (ref 150–400)
RBC: 3.9 MIL/uL (ref 3.87–5.11)
RDW: 13.2 % (ref 11.5–15.5)
WBC Count: 11.4 10*3/uL — ABNORMAL HIGH (ref 4.0–10.5)
nRBC: 0 % (ref 0.0–0.2)

## 2018-09-26 LAB — CMP (CANCER CENTER ONLY)
ALT: 13 U/L (ref 0–44)
AST: 14 U/L — AB (ref 15–41)
Albumin: 3.7 g/dL (ref 3.5–5.0)
Alkaline Phosphatase: 55 U/L (ref 38–126)
Anion gap: 10 (ref 5–15)
BUN: 13 mg/dL (ref 6–20)
CO2: 26 mmol/L (ref 22–32)
Calcium: 9 mg/dL (ref 8.9–10.3)
Chloride: 103 mmol/L (ref 98–111)
Creatinine: 0.73 mg/dL (ref 0.44–1.00)
GFR, Est AFR Am: >60 mL/min (ref >60)
GFR, Estimated: >60 mL/min (ref >60)
Glucose, Bld: 101 mg/dL — ABNORMAL HIGH (ref 70–99)
Potassium: 3.7 mmol/L (ref 3.5–5.1)
Sodium: 139 mmol/L (ref 135–145)
Total Bilirubin: 0.3 mg/dL (ref 0.3–1.2)
Total Protein: 6.9 g/dL (ref 6.5–8.1)

## 2018-09-26 LAB — VITAMIN B12: Vitamin B-12: 265 pg/mL (ref 180–914)

## 2018-09-26 LAB — RESEARCH LABS

## 2018-09-26 LAB — FOLATE: Folate: 4.7 ng/mL — ABNORMAL LOW (ref >5.9)

## 2018-09-26 LAB — FERRITIN: FERRITIN: 39 ng/mL (ref 11–307)

## 2018-09-26 MED ORDER — PALONOSETRON HCL INJECTION 0.25 MG/5ML
0.2500 mg | Freq: Once | INTRAVENOUS | Status: AC
  Administered 2018-09-26: 0.25 mg via INTRAVENOUS

## 2018-09-26 MED ORDER — DOXORUBICIN HCL CHEMO IV INJECTION 2 MG/ML
60.0000 mg/m2 | Freq: Once | INTRAVENOUS | Status: AC
  Administered 2018-09-26: 122 mg via INTRAVENOUS
  Filled 2018-09-26: qty 61

## 2018-09-26 MED ORDER — SODIUM CHLORIDE 0.9 % IV SOLN
Freq: Once | INTRAVENOUS | Status: AC
  Administered 2018-09-26: 10:00:00 via INTRAVENOUS
  Filled 2018-09-26: qty 5

## 2018-09-26 MED ORDER — SODIUM CHLORIDE 0.9% FLUSH
10.0000 mL | INTRAVENOUS | Status: DC | PRN
  Administered 2018-09-26: 10 mL
  Filled 2018-09-26: qty 10

## 2018-09-26 MED ORDER — PALONOSETRON HCL INJECTION 0.25 MG/5ML
INTRAVENOUS | Status: AC
  Filled 2018-09-26: qty 5

## 2018-09-26 MED ORDER — SODIUM CHLORIDE 0.9 % IV SOLN
Freq: Once | INTRAVENOUS | Status: AC
  Administered 2018-09-26: 09:00:00 via INTRAVENOUS
  Filled 2018-09-26: qty 250

## 2018-09-26 MED ORDER — SODIUM CHLORIDE 0.9 % IV SOLN
600.0000 mg/m2 | Freq: Once | INTRAVENOUS | Status: AC
  Administered 2018-09-26: 1220 mg via INTRAVENOUS
  Filled 2018-09-26: qty 61

## 2018-09-26 MED ORDER — HEPARIN SOD (PORK) LOCK FLUSH 100 UNIT/ML IV SOLN
500.0000 [IU] | Freq: Once | INTRAVENOUS | Status: AC | PRN
  Administered 2018-09-26: 500 [IU]
  Filled 2018-09-26: qty 5

## 2018-09-26 NOTE — Progress Notes (Signed)
Patient Care Team: [name], DO as PCP - General (Family Medicine) Hurley Cisco, MD as Consulting Physician (Rheumatology) [name], MD as Consulting Physician (Orthopedic Surgery) Erroll Luna, MD as Consulting Physician (General Surgery) Nicholas Lose, MD as Consulting Physician (Hematology and Oncology) Kyung Rudd, MD as Consulting Physician (Radiation Oncology) Maxwell Marion, RN as Registered Nurse (Medical Oncology)  DIAGNOSIS:    ICD-10-CM   1. Malignant neoplasm of upper-outer quadrant of left breast in female, estrogen receptor positive ([place]) C50.412 Tbo-Filgrastim (GRANIX) injection 480 mcg   Z17.0     SUMMARY OF ONCOLOGIC HISTORY:   Malignant neoplasm of upper-outer quadrant of left breast in female, estrogen receptor positive ([place])   07/13/2018 Initial Diagnosis     Palpable left breast mass, 2 adjacent masses at 2 o'clock position 2 cm biopsy IDC grade 2-3, Ig DCIS, ER 95%, PR 90%, Ki-67 30%, HER-2 -1+; 2:30 position intramammary lymph node 1.3 cm biopsy positive; left axillary lymph node biopsy negative discordant, T2N1, stage IIa    07/25/2018 Cancer Staging    Staging form: Breast, AJCC 8th Edition - Clinical: Stage IIA (cT1c, cN1, cM0, G3, ER+, PR+, HER2-) - Signed by Nicholas Lose, MD on 07/25/2018    08/21/2018 Surgery    Bilateral mastectomies: Left mastectomy: IDC grade 3, 2.1 cm, high-grade DCIS, margins negative, negative for LV I, 3/9 lymph nodes positive, ER 95% positive, PR 90% positive, HER-2 -1+, Ki-67 30% T2N1A stage IIa right mastectomy: Benign    09/03/2018 Cancer Staging    Staging form: Breast, AJCC 8th Edition - Pathologic: Stage IIA (pT2, pN1a, cM0, G3, ER+, PR+, HER2-) - Signed by Nicholas Lose, MD on 09/03/2018    09/03/2018 Miscellaneous    MammaPrint: High risk luminal type B, average 10-year risk of recurrence untreated 29%, predicted 5-year benefit of chemotherapy 94.6% distant metastasis free interval    09/26/2018 -   Chemotherapy    Adjuvant chemotherapy with dose dense Adriamycin and Cytoxan x4 followed by weekly Taxol x12     CHIEF COMPLIANT: Cycle 1 Day 7 Adriamycin and Cytoxan  INTERVAL HISTORY: Paula Jacobs is a 58 y.o. with above-mentioned history of left breast cancerwhounderwent bilateral mastectomies and is currently on chemotherapy with dose dense Adriamycin and Cytoxan. She presents to the clinic today with her husband and reports treatment was difficult. She has been very fatigued and note she has altered her diet to avoid spicy food because when she ate after treatment she felt very sick. She denies vomiting, but notes nausea on her treatment day and the day after which has resolved with medication. She notes she has not been able to sleep and the steroids have exacerbated her ADHD. She missed her Neulasta injection on Friday and will get Granix injections over the next few days to prepare for her next treatment.   REVIEW OF SYSTEMS:   Constitutional: Denies fevers, chills or abnormal weight loss (+) severe fatigue Eyes: Denies blurriness of vision Ears, nose, mouth, throat, and face: Denies mucositis or sore throat Respiratory: Denies cough, dyspnea or wheezes Cardiovascular: Denies palpitation, chest discomfort Gastrointestinal:  Denies heartburn or change in bowel habits (+) nausea  Skin: Denies abnormal skin rashes Lymphatics: Denies new lymphadenopathy or easy bruising Neurological:Denies numbness, tingling or new weaknesses Behavioral/Psych: Mood is stable, no new changes (+) difficulty sleeping Extremities: No lower extremity edema Breast: denies any pain or lumps or nodules in either breasts All other systems were reviewed with the patient and are negative.  I have reviewed  the past medical history, past surgical history, social history and family history with the patient and they are unchanged from previous note.  ALLERGIES:  is allergic to hydrocodone-acetaminophen; latex;  and tramadol hcl.  MEDICATIONS:  Current Outpatient Medications  Medication Sig Dispense Refill  . acetaminophen (TYLENOL) 500 MG tablet Take 1,000 mg by mouth every 6 (six) hours as needed for moderate pain.    . benazepril-hydrochlorthiazide (LOTENSIN HCT) 10-12.5 MG tablet TAKE 1 TABLET BY MOUTH DAILY 90 tablet 0  . dexamethasone (DECADRON) 4 MG tablet Take 1 tablet day after chemo and 1 tablet 2 days after chemo with food 30 tablet 1  . ibuprofen (ADVIL,MOTRIN) 800 MG tablet Take 1 tablet (800 mg total) by mouth every 8 (eight) hours as needed. 30 tablet 0  . levothyroxine (SYNTHROID, LEVOTHROID) 175 MCG tablet TAKE 1 TABLET BY MOUTH EVERY MORNING (Patient taking differently: Take 175 mcg by mouth daily before breakfast. ) 30 tablet 5  . lidocaine-prilocaine (EMLA) cream Apply to affected area once 30 g 3  . LORazepam (ATIVAN) 0.5 MG tablet Take 1 tablet (0.5 mg total) by mouth at bedtime as needed for sleep. 30 tablet 0  . ondansetron (ZOFRAN) 8 MG tablet Take 1 tablet (8 mg total) by mouth 2 (two) times daily as needed. Start on the third day after chemotherapy. 30 tablet 1  . oxyCODONE (OXY IR/ROXICODONE) 5 MG immediate release tablet Take 1 tablet (5 mg total) by mouth every 6 (six) hours as needed for severe pain. 10 tablet 0  . prochlorperazine (COMPAZINE) 10 MG tablet Take 1 tablet (10 mg total) by mouth every 6 (six) hours as needed (Nausea or vomiting). 30 tablet 1   Current Facility-Administered Medications  Medication Dose Route Frequency Provider Last Rate Last Dose  . Tbo-Filgrastim (GRANIX) injection 480 mcg  480 mcg Subcutaneous Daily Nicholas Lose, MD       Facility-Administered Medications Ordered in Other Visits  Medication Dose Route Frequency Provider Last Rate Last Dose  . heparin lock flush 100 unit/mL  500 Units Intracatheter Once PRN Nicholas Lose, MD      . sodium chloride flush (NS) 0.9 % injection 10 mL  10 mL Intracatheter PRN Nicholas Lose, MD         PHYSICAL EXAMINATION: ECOG PERFORMANCE STATUS: 1 - Symptomatic but completely ambulatory  Vitals:   10/02/18 0852  BP: 115/71  Pulse: 89  Resp: 18  Temp: 97.8 F (36.6 C)  SpO2: 99%   Filed Weights   10/02/18 0852  Weight: 189 lb 12.8 oz (86.1 kg)    GENERAL: alert, no distress and comfortable SKIN: skin color, texture, turgor are normal, no rashes or significant lesions EYES: normal, Conjunctiva are pink and non-injected, sclera clear OROPHARYNX: no exudate, no erythema and lips, buccal mucosa, and tongue normal  NECK: supple, thyroid normal size, non-tender, without nodularity LYMPH:  no palpable lymphadenopathy in the cervical, axillary or inguinal LUNGS: clear to auscultation and percussion with normal breathing effort HEART: regular rate & rhythm and no murmurs and no lower extremity edema ABDOMEN: abdomen soft, non-tender and normal bowel sounds MUSCULOSKELETAL: no cyanosis of digits and no clubbing  NEURO: alert & oriented x 3 with fluent speech, no focal motor/sensory deficits EXTREMITIES: No lower extremity edema  LABORATORY DATA:  I have reviewed the data as listed CMP Latest Ref Rng & Units 09/26/2018 09/24/2018 08/22/2018  Glucose 70 - 99 mg/dL 101(H) 113(H) 127(H)  BUN 6 - 20 mg/dL _0 Creatinine 0.44 -  1.00 mg/dL 0.73 0.82 1.03(H)  Sodium 135 - 145 mmol/L 139 138 137  Potassium 3.5 - 5.1 mmol/L 3.7 3.4(L) 4.5  Chloride 98 - 111 mmol/L 103 102 102  CO2 22 - 32 mmol/L _0 Calcium 8.9 - 10.3 mg/dL 9.0 9.3 8.1(L)  Total Protein 6.5 - 8.1 g/dL 6.9 7.0 5.4(L)  Total Bilirubin 0.3 - 1.2 mg/dL 0.3 0.5 0.3  Alkaline Phos 38 - 126 U/L 55 48 35(L)  AST 15 - 41 U/L 14(L) 17 18  ALT 0 - 44 U/L _1 Lab Results  Component Value Date   WBC 3.9 (L) 10/02/2018   HGB 11.0 (L) 10/02/2018   HCT 35.2 (L) 10/02/2018   MCV 85.6 10/02/2018   PLT 322 10/02/2018   NEUTROABS PENDING 10/02/2018    ASSESSMENT & PLAN:  Malignant neoplasm of upper-outer  quadrant of left breast in female, estrogen receptor positive ([place]) 08/21/2018:Bilateral mastectomies: Left mastectomy: IDC grade 3, 2.1 cm, high-grade DCIS, margins negative, negative for LV I, 3/9 lymph nodes positive, ER 95% positive, PR 90% positive, HER-2 -1+, Ki-67 30% T2N1A stage IIa right mastectomy: Benign MammaPrint: High risk luminal type B Echocardiogram 09/18/2018: EF 55 to 60% Patient has been enrolled and upbeat clinical trial: No adverse effects related to the trial  Treatment plan: 1.Adjuvant chemotherapy with dose dense Adriamycin and Cytoxan x4 followed by weekly Taxol x12 2.Followed by adjuvant antiestrogen therapy ------------------------------------------------------------------------------------------------------------------ Current Treatment: Cycle 1 day 8 Adriamycin and Cytoxan Started 09/26/18 Chemo Toxicities:  1.  Nausea: Mild to moderate 2.  Patient forgot to come for her [facility] appointment.  We will give her Granix for the next 4 days. 3.  Severe fatigue: Today is the worst so far.  RTC in 1 week for cycle 2    No orders of the defined types were placed in this encounter.  The patient has a good understanding of the overall plan. she agrees with it. she will call with any problems that may develop before the next visit here.  Nicholas Lose, MD 10/02/2018  Paula Jacobs am acting as scribe for Dr. Nicholas Lose.  I have reviewed the above documentation for accuracy and completeness, and I agree with the above.

## 2018-09-26 NOTE — Patient Instructions (Signed)
[facility] Discharge Instructions for Patients Receiving Chemotherapy  Today you received the following chemotherapy agents Adriamycin and Cytoxan  To help prevent nausea and vomiting after your treatment, we encourage you to take your nausea medication as directed If you develop nausea and vomiting that is not controlled by your nausea medication, call the clinic.   BELOW ARE SYMPTOMS THAT SHOULD BE REPORTED IMMEDIATELY:  *FEVER GREATER THAN 100.5 F  *CHILLS WITH OR WITHOUT FEVER  NAUSEA AND VOMITING THAT IS NOT CONTROLLED WITH YOUR NAUSEA MEDICATION  *UNUSUAL SHORTNESS OF BREATH  *UNUSUAL BRUISING OR BLEEDING  TENDERNESS IN MOUTH AND THROAT WITH OR WITHOUT PRESENCE OF ULCERS  *URINARY PROBLEMS  *BOWEL PROBLEMS  UNUSUAL RASH Items with * indicate a potential emergency and should be followed up as soon as possible.  Feel free to call the clinic should you have any questions or concerns. The clinic phone number is [phone].  Please show the [facility] at check-in to the Emergency Department and triage nurse.   Doxorubicin (Adriamycin) injection What is this medicine? DOXORUBICIN (dox oh ROO bi sin) is a chemotherapy drug. It is used to treat many kinds of cancer like leukemia, lymphoma, neuroblastoma, sarcoma, and Wilms' tumor. It is also used to treat bladder cancer, breast cancer, lung cancer, ovarian cancer, stomach cancer, and thyroid cancer. This medicine may be used for other purposes; ask your health care provider or pharmacist if you have questions. COMMON BRAND NAME(S): Adriamycin, Adriamycin PFS, Adriamycin RDF, Rubex What should I tell my health care provider before I take this medicine? They need to know if you have any of these conditions: -heart disease -history of low blood counts caused by a medicine -liver disease -recent or ongoing radiation therapy -an unusual or allergic reaction to doxorubicin, other chemotherapy agents,  other medicines, foods, dyes, or preservatives -pregnant or trying to get pregnant -breast-feeding How should I use this medicine? This drug is given as an infusion into a vein. It is administered in a hospital or clinic by a specially trained health care professional. If you have pain, swelling, burning or any unusual feeling around the site of your injection, tell your health care professional right away. Talk to your pediatrician regarding the use of this medicine in children. Special care may be needed. Overdosage: If you think you have taken too much of this medicine contact a poison control center or emergency room at once. NOTE: This medicine is only for you. Do not share this medicine with others. What if I miss a dose? It is important not to miss your dose. Call your doctor or health care professional if you are unable to keep an appointment. What may interact with this medicine? This medicine may interact with the following medications: -6-mercaptopurine -paclitaxel -phenytoin -St. John's Wort -trastuzumab -verapamil This list may not describe all possible interactions. Give your health care provider a list of all the medicines, herbs, non-prescription drugs, or dietary supplements you use. Also tell them if you smoke, drink alcohol, or use illegal drugs. Some items may interact with your medicine. What should I watch for while using this medicine? This drug may make you feel generally unwell. This is not uncommon, as chemotherapy can affect healthy cells as well as cancer cells. Report any side effects. Continue your course of treatment even though you feel ill unless your doctor tells you to stop. There is a maximum amount of this medicine you should receive throughout your life. The amount depends on  the medical condition being treated and your overall health. Your doctor will watch how much of this medicine you receive in your lifetime. Tell your doctor if you have taken this  medicine before. You may need blood work done while you are taking this medicine. Your urine may turn red for a few days after your dose. This is not blood. If your urine is dark or brown, call your doctor. In some cases, you may be given additional medicines to help with side effects. Follow all directions for their use. Call your doctor or health care professional for advice if you get a fever, chills or sore throat, or other symptoms of a cold or flu. Do not treat yourself. This drug decreases your body's ability to fight infections. Try to avoid being around people who are sick. This medicine may increase your risk to bruise or bleed. Call your doctor or health care professional if you notice any unusual bleeding. Talk to your doctor about your risk of cancer. You may be more at risk for certain types of cancers if you take this medicine. Do not become pregnant while taking this medicine or for 6 months after stopping it. Women should inform their doctor if they wish to become pregnant or think they might be pregnant. Men should not father a child while taking this medicine and for 6 months after stopping it. There is a potential for serious side effects to an unborn child. Talk to your health care professional or pharmacist for more information. Do not breast-feed an infant while taking this medicine. This medicine has caused ovarian failure in some women and reduced sperm counts in some men This medicine may interfere with the ability to have a child. Talk with your doctor or health care professional if you are concerned about your fertility. This medicine may cause a decrease in Co-Enzyme Q-10. You should make sure that you get enough Co-Enzyme Q-10 while you are taking this medicine. Discuss the foods you eat and the vitamins you take with your health care professional. What side effects may I notice from receiving this medicine? Side effects that you should report to your doctor or health care  professional as soon as possible: -allergic reactions like skin rash, itching or hives, swelling of the face, lips, or tongue -breathing problems -chest pain -fast or irregular heartbeat -low blood counts - this medicine may decrease the number of white blood cells, red blood cells and platelets. You may be at increased risk for infections and bleeding. -pain, redness, or irritation at site where injected -signs of infection - fever or chills, cough, sore throat, pain or difficulty passing urine -signs of decreased platelets or bleeding - bruising, pinpoint red spots on the skin, black, tarry stools, blood in the urine -swelling of the ankles, feet, hands -tiredness -weakness Side effects that usually do not require medical attention (report to your doctor or health care professional if they continue or are bothersome): -diarrhea -hair loss -mouth sores -nail discoloration or damage -nausea -red colored urine -vomiting This list may not describe all possible side effects. Call your doctor for medical advice about side effects. You may report side effects to FDA at 1-800-FDA-1088. Where should I keep my medicine? This drug is given in a hospital or clinic and will not be stored at home. NOTE: This sheet is a summary. It may not cover all possible information. If you have questions about this medicine, talk to your doctor, pharmacist, or health care provider.  2019  Elsevier/Gold Standard (2017-04-19 11:01:26)   Cyclophosphamide (Cytoxan) injection What is this medicine? CYCLOPHOSPHAMIDE (sye kloe FOSS fa mide) is a chemotherapy drug. It slows the growth of cancer cells. This medicine is used to treat many types of cancer like lymphoma, myeloma, leukemia, breast cancer, and ovarian cancer, to name a few. This medicine may be used for other purposes; ask your health care provider or pharmacist if you have questions. COMMON BRAND NAME(S): Cytoxan, Neosar What should I tell my health care  provider before I take this medicine? They need to know if you have any of these conditions: -blood disorders -history of other chemotherapy -infection -kidney disease -liver disease -recent or ongoing radiation therapy -tumors in the bone marrow -an unusual or allergic reaction to cyclophosphamide, other chemotherapy, other medicines, foods, dyes, or preservatives -pregnant or trying to get pregnant -breast-feeding How should I use this medicine? This drug is usually given as an injection into a vein or muscle or by infusion into a vein. It is administered in a hospital or clinic by a specially trained health care professional. Talk to your pediatrician regarding the use of this medicine in children. Special care may be needed. Overdosage: If you think you have taken too much of this medicine contact a poison control center or emergency room at once. NOTE: This medicine is only for you. Do not share this medicine with others. What if I miss a dose? It is important not to miss your dose. Call your doctor or health care professional if you are unable to keep an appointment. What may interact with this medicine? This medicine may interact with the following medications: -amiodarone -amphotericin B -azathioprine -certain antiviral medicines for HIV or AIDS such as protease inhibitors (e.g., indinavir, ritonavir) and zidovudine -certain blood pressure medications such as benazepril, captopril, enalapril, fosinopril, lisinopril, moexipril, monopril, perindopril, quinapril, ramipril, trandolapril -certain cancer medications such as anthracyclines (e.g., daunorubicin, doxorubicin), busulfan, cytarabine, paclitaxel, pentostatin, tamoxifen, trastuzumab -certain diuretics such as chlorothiazide, chlorthalidone, hydrochlorothiazide, indapamide, metolazone -certain medicines that treat or prevent blood clots like warfarin -certain muscle relaxants such as  succinylcholine -cyclosporine -etanercept -indomethacin -medicines to increase blood counts like filgrastim, pegfilgrastim, sargramostim -medicines used as general anesthesia -metronidazole -natalizumab This list may not describe all possible interactions. Give your health care provider a list of all the medicines, herbs, non-prescription drugs, or dietary supplements you use. Also tell them if you smoke, drink alcohol, or use illegal drugs. Some items may interact with your medicine. What should I watch for while using this medicine? Visit your doctor for checks on your progress. This drug may make you feel generally unwell. This is not uncommon, as chemotherapy can affect healthy cells as well as cancer cells. Report any side effects. Continue your course of treatment even though you feel ill unless your doctor tells you to stop. Drink water or other fluids as directed. Urinate often, even at night. In some cases, you may be given additional medicines to help with side effects. Follow all directions for their use. Call your doctor or health care professional for advice if you get a fever, chills or sore throat, or other symptoms of a cold or flu. Do not treat yourself. This drug decreases your body's ability to fight infections. Try to avoid being around people who are sick. This medicine may increase your risk to bruise or bleed. Call your doctor or health care professional if you notice any unusual bleeding. Be careful brushing and flossing your teeth or using a toothpick because  you may get an infection or bleed more easily. If you have any dental work done, tell your dentist you are receiving this medicine. You may get drowsy or dizzy. Do not drive, use machinery, or do anything that needs mental alertness until you know how this medicine affects you. Do not become pregnant while taking this medicine or for 1 year after stopping it. Women should inform their doctor if they wish to become  pregnant or think they might be pregnant. Men should not father a child while taking this medicine and for 4 months after stopping it. There is a potential for serious side effects to an unborn child. Talk to your health care professional or pharmacist for more information. Do not breast-feed an infant while taking this medicine. This medicine may interfere with the ability to have a child. This medicine has caused ovarian failure in some women. This medicine has caused reduced sperm counts in some men. You should talk with your doctor or health care professional if you are concerned about your fertility. If you are going to have surgery, tell your doctor or health care professional that you have taken this medicine. What side effects may I notice from receiving this medicine? Side effects that you should report to your doctor or health care professional as soon as possible: -allergic reactions like skin rash, itching or hives, swelling of the face, lips, or tongue -low blood counts - this medicine may decrease the number of white blood cells, red blood cells and platelets. You may be at increased risk for infections and bleeding. -signs of infection - fever or chills, cough, sore throat, pain or difficulty passing urine -signs of decreased platelets or bleeding - bruising, pinpoint red spots on the skin, black, tarry stools, blood in the urine -signs of decreased red blood cells - unusually weak or tired, fainting spells, lightheadedness -breathing problems -dark urine -dizziness -palpitations -swelling of the ankles, feet, hands -trouble passing urine or change in the amount of urine -weight gain -yellowing of the eyes or skin Side effects that usually do not require medical attention (report to your doctor or health care professional if they continue or are bothersome): -changes in nail or skin color -hair loss -missed menstrual periods -mouth sores -nausea, vomiting This list may not  describe all possible side effects. Call your doctor for medical advice about side effects. You may report side effects to FDA at 1-800-FDA-1088. Where should I keep my medicine? This drug is given in a hospital or clinic and will not be stored at home. NOTE: This sheet is a summary. It may not cover all possible information. If you have questions about this medicine, talk to your doctor, pharmacist, or health care provider.  2019 Elsevier/Gold Standard (2012-07-20 16:22:58)

## 2018-09-26 NOTE — Assessment & Plan Note (Signed)
08/21/2018:Bilateral mastectomies: Left mastectomy: IDC grade 3, 2.1 cm, high-grade DCIS, margins negative, negative for LV I, 3/9 lymph nodes positive, ER 95% positive, PR 90% positive, HER-2 -1+, Ki-67 30% T2N1A stage IIa right mastectomy: Benign MammaPrint: High risk luminal type B  Treatment plan: 1.  Adjuvant chemotherapy with dose dense Adriamycin and Cytoxan x4 followed by weekly Taxol x12 2.  Followed by adjuvant antiestrogen therapy

## 2018-09-27 ENCOUNTER — Ambulatory Visit (INDEPENDENT_AMBULATORY_CARE_PROVIDER_SITE_OTHER): Payer: BLUE CROSS/BLUE SHIELD | Admitting: Family Medicine

## 2018-09-27 ENCOUNTER — Encounter: Payer: Self-pay | Admitting: Family Medicine

## 2018-09-27 ENCOUNTER — Telehealth: Payer: Self-pay | Admitting: Hematology and Oncology

## 2018-09-27 ENCOUNTER — Ambulatory Visit: Payer: BLUE CROSS/BLUE SHIELD

## 2018-09-27 ENCOUNTER — Encounter: Payer: Self-pay | Admitting: Medical Oncology

## 2018-09-27 ENCOUNTER — Telehealth: Payer: Self-pay

## 2018-09-27 VITALS — BP 124/70 | HR 73 | Temp 97.8°F | Ht 62.5 in | Wt 201.8 lb

## 2018-09-27 DIAGNOSIS — M069 Rheumatoid arthritis, unspecified: Secondary | ICD-10-CM

## 2018-09-27 DIAGNOSIS — I10 Essential (primary) hypertension: Secondary | ICD-10-CM | POA: Diagnosis not present

## 2018-09-27 DIAGNOSIS — F419 Anxiety disorder, unspecified: Secondary | ICD-10-CM | POA: Diagnosis not present

## 2018-09-27 DIAGNOSIS — E039 Hypothyroidism, unspecified: Secondary | ICD-10-CM

## 2018-09-27 DIAGNOSIS — C50912 Malignant neoplasm of unspecified site of left female breast: Secondary | ICD-10-CM

## 2018-09-27 DIAGNOSIS — I729 Aneurysm of unspecified site: Secondary | ICD-10-CM

## 2018-09-27 DIAGNOSIS — Z170 Estrogen receptor positive status [ER+]: Principal | ICD-10-CM

## 2018-09-27 DIAGNOSIS — G4700 Insomnia, unspecified: Secondary | ICD-10-CM | POA: Diagnosis not present

## 2018-09-27 DIAGNOSIS — C50412 Malignant neoplasm of upper-outer quadrant of left female breast: Secondary | ICD-10-CM

## 2018-09-27 NOTE — Patient Instructions (Signed)
BEFORE YOU LEAVE: -rx for TSH  -follow up: 3-4 months  See Dennison Bulla for help with the sleep and anxiety.  Get thyroid level at cancer center if able.  Get flu shot as soon as possible if able.

## 2018-09-27 NOTE — Telephone Encounter (Signed)
Per 1/8 no los °

## 2018-09-27 NOTE — Research (Signed)
UPBEAT: Completion of baseline data collection 09/26/2018 Late entry Patient in clinic yesterday for start of chemotherapy. Labs: baseline fasting labs collected. Patient confirms fasting for at least three hours.  Vital Signs:  Patient's height and weight collected and as well as two B/P and HR's, per study requirement. Height= 62" weight= 195.3 and were measure per study protocol requirements of no shoes or heavy jackets.  First B/P= 115/55 HR= 77 take after patient was seated comfortable for 5 minutes, second set taken after one minute from the first set and was B/P= 138/88, HR= 70. Vitals assessment completed by research nurse Merrilee Seashore, RN. Patient was thanked for her contribution to study and was encouraged to call Dr. Lindi Adie or myself with questions or concerns.  Maxwell Marion, RN, BSN, [facility] Clinical Research 09/27/2018 1:26 PM

## 2018-09-27 NOTE — Telephone Encounter (Signed)
Called pt to follow up with chemo. Pt states that she got sick last night after chemo, but took her nausea medication after. Told pt to take nausea medication every 6hrs. Try to take before eating. Advised on aggressive oral hydration and monitoring for fevers/chills. Pt verbalized understanding. Pt doing well today with no other symptoms. She know when to call for any concerns or questions.

## 2018-09-27 NOTE — Progress Notes (Signed)
HPI:  Using dictation device. Unfortunately this device frequently misinterprets words/phrases.  Acute visit for difficulty sleeping: -unfortunately dx with breast ca in 2019 and undergoing management/treatment with oncology (s/p mastectomies, now on chemo) -complicated by anemia currently treated with oncology -reports has always had anxiety and sleep issues, but not worse -she does not want to take medications if possible -she has a very strong [place] faith and feels has not been depressed and is handling the situation one day at a time -is interested in CBT -hx depression on effexor in the past  Hx HTN, RA (sees rheumatology for management), Brain aneurysm (sees [facility] for management) and Hypothyroidism. Doing ok. Due for tsh check. Other labs checked with onc. No CP, SOB. She will need to see gyn for pelvic /pap.She plans to do after over the acute treatment for cancer. She wants to talk with her oncologist about the flu shot.  ROS: See pertinent positives and negatives per HPI.  Past Medical History:  Diagnosis Date  . ANEMIA-NOS 04/23/2008  . Aneurysm ([place]) 10/19/2017   Evaluated by [facility] 10/2017, 39mm, stable  . Anginal pain ([place])    went to ED in april 2018 c/o chest pain over last 2 months ; had EKG, CXR  and troponin negative per physician suspected musculoskeletal  ; dc'd with ibuprofen  and recc f/u with outpatient stress test; see care everywhere ED visit  ; patient denies recurrence of Chest pain since, endorses occ palpitations   . DEPRESSION 04/23/2008  . DJD (degenerative joint disease)   . Family history of breast cancer   . Hyperplastic colon polyp    x2  . Hypertension   . HYPOTHYROIDISM 10/17/2007  . Osteoarthritis   . Palpitations    freq at night;  at pre-op states she drinks caffeine and that makes it worse; says the palpitations started after they took her thyroid   . PAT 10/27/2009   Qualifier: Diagnosis of  By: [name], MD, [name]    . Pneumonia    "in the  past, havent had it in years"  . PONV (postoperative nausea and vomiting)    only after 1979 surgery  . RA (rheumatoid arthritis) ([place])    "problems in feet, hands, and knees"  . 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 SVT (supraventricular tachycardia) ([facility])    chronic   . Tubulovillous adenoma of colon     Past Surgical History:  Procedure Laterality Date  . ABDOMINAL HYSTERECTOMY    . APPENDECTOMY    . BUNIONECTOMY  10/2009 right & 02/03/10 left  . COLONOSCOPY W/ POLYPECTOMY  2015  . HAMMER TOE SURGERY     "all toes have pins, done with bunionectomy"  . INGUINAL HERNIA REPAIR    . KNEE CLOSED REDUCTION Left 12/05/2017   Procedure: CLOSED MANIPULATION LEFT KNEE;  Surgeon: [name], MD;  Location: [facility];  Service: Orthopedics;  Laterality: Left;  Marland Kitchen MASTECTOMY WITH RADIOACTIVE SEED GUIDED EXCISION AND AXILLARY SENTINEL LYMPH NODE BIOPSY Bilateral 08/21/2018   Procedure: BILATERAL SIMPLE MASTECTOMIES WITH LEFT AXILLARY RADIOACTIVE SEED GUIDED LYMPH NODE EXCISION AND LEFT AXILLARY SENTINEL LYMPH NODE BIOPSY;  Surgeon: Erroll Luna, MD;  Location: [facility];  Service: General;  Laterality: Bilateral;  . neck fusion     x3  . PORTACATH PLACEMENT Right 09/25/2018   Procedure: INSERTION PORT-A-CATH WITH ULTRASOUND ERAS PATHWAY;  Surgeon: Erroll Luna, MD;  [facility];  Service: General;  Laterality: Right;  . THYROIDECTOMY  2001   for nodules  . TOE AMPUTATION  1979   6th toe removed from each foot  . TOTAL KNEE ARTHROPLASTY Left 10/21/2014   Procedure: LEFT TOTAL KNEE ARTHROPLASTY;  Surgeon: [name], MD;  Location: [facility];  Service: Orthopedics;  Laterality: Left;  . TOTAL KNEE ARTHROPLASTY Right 12/05/2017   Procedure: RIGHT TOTAL KNEE ARTHROPLASTY;  Surgeon: [name], MD;  Location: [facility];  Service: Orthopedics;  Laterality: Right;  70 mins    Family History  Problem Relation Age of Onset  . Breast cancer Mother 63        metastatic to brain, died at 5  . Dementia Father        died at 63  . Heart attack Maternal Grandmother   . Stroke Maternal Grandmother   . Diabetes Maternal Grandmother   . Heart disease Maternal Grandmother   . Thyroid disease Maternal Grandmother   . COPD Maternal Aunt   . Cancer Paternal Uncle        details unk  . Esophageal cancer Neg Hx   . Rectal cancer Neg Hx   . Stomach cancer Neg Hx     SOCIAL HX: see hpi   Current Outpatient Medications:  .  acetaminophen (TYLENOL) 500 MG tablet, Take 1,000 mg by mouth every 6 (six) hours as needed for moderate pain., Disp: , Rfl:  .  benazepril-hydrochlorthiazide (LOTENSIN HCT) 10-12.5 MG tablet, TAKE 1 TABLET BY MOUTH DAILY, Disp: 90 tablet, Rfl: 0 .  dexamethasone (DECADRON) 4 MG tablet, Take 1 tablet day after chemo and 1 tablet 2 days after chemo with food, Disp: 30 tablet, Rfl: 1 .  ibuprofen (ADVIL,MOTRIN) 800 MG tablet, Take 1 tablet (800 mg total) by mouth every 8 (eight) hours as needed., Disp: 30 tablet, Rfl: 0 .  levothyroxine (SYNTHROID, LEVOTHROID) 175 MCG tablet, TAKE 1 TABLET BY MOUTH EVERY MORNING (Patient taking differently: Take 175 mcg by mouth daily before breakfast. ), Disp: 30 tablet, Rfl: 5 .  lidocaine-prilocaine (EMLA) cream, Apply to affected area once, Disp: 30 g, Rfl: 3 .  LORazepam (ATIVAN) 0.5 MG tablet, Take 1 tablet (0.5 mg total) by mouth at bedtime as needed for sleep., Disp: 30 tablet, Rfl: 0 .  ondansetron (ZOFRAN) 8 MG tablet, Take 1 tablet (8 mg total) by mouth 2 (two) times daily as needed. Start on the third day after chemotherapy., Disp: 30 tablet, Rfl: 1 .  oxyCODONE (OXY IR/ROXICODONE) 5 MG immediate release tablet, Take 1 tablet (5 mg total) by mouth every 6 (six) hours as needed for severe pain., Disp: 10 tablet, Rfl: 0 .  prochlorperazine (COMPAZINE) 10 MG tablet, Take 1 tablet (10 mg total) by mouth every 6 (six) hours as needed (Nausea or vomiting)., Disp: 30 tablet, Rfl:  1  EXAM:  Vitals:   09/27/18 1125  BP: 124/70  Pulse: 73  Temp: 97.8 F (36.6 C)    Body mass index is 36.32 kg/m.  GENERAL: vitals reviewed and listed above, alert, oriented, appears well hydrated and in no acute distress  HEENT: atraumatic, conjunttiva clear, no obvious abnormalities on inspection of external nose and ears  NECK: no obvious masses on inspection  LUNGS: clear to auscultation bilaterally, no wheezes, rales or rhonchi, good air movement  CV: HRRR, no peripheral edema  MS: moves all extremities without noticeable abnormality  PSYCH: pleasant and cooperative, no obvious depression or anxiety  ASSESSMENT AND PLAN:  Discussed the following assessment and plan:  Insomnia, unspecified type Anxiety -discussed  tx options and risks -she has opted to do CBT, prefers to avoid medications -she plans to set up appointment with Dennison Bulla here in behavioral health and the number to call was provided -advised to follow up here if any concerns  Malignant neoplasm of left female breast, unspecified estrogen receptor status, unspecified site of breast ([place]) -seeing team of specialist for management -s/p mastectomies, undergoing chemo, then reports radiation is planned  Essential hypertension -stable  Hypothyroidism, unspecified type -advised TSH, she wanted to see if could do with cancer labs - order on rx provided  Morbid obesity ([place]) -discussed healthy diet and working with oncology regarding any cancer treatment related nausea  Aneurysm ([place]) -sees [facility] for management  Rheumatoid arthritis, involving unspecified site, unspecified rheumatoid factor presence ([place]) -rheum management per pt  She plans to discuss flu shot with her oncologist.  -Patient advised to return or notify a doctor immediately if symptoms worsen or persist or new concerns arise.  Patient Instructions  BEFORE YOU LEAVE: -rx for TSH  -follow up: 3-4 months  See Dennison Bulla for  help with the sleep and anxiety.  Get thyroid level at cancer center if able.  Get flu shot as soon as possible if able.    [name], DO

## 2018-09-28 ENCOUNTER — Inpatient Hospital Stay: Payer: BLUE CROSS/BLUE SHIELD

## 2018-09-28 MED ORDER — PEGFILGRASTIM-CBQV 6 MG/0.6ML ~~LOC~~ SOSY
PREFILLED_SYRINGE | SUBCUTANEOUS | Status: AC
  Filled 2018-09-28: qty 0.6

## 2018-10-02 ENCOUNTER — Other Ambulatory Visit: Payer: Self-pay

## 2018-10-02 ENCOUNTER — Encounter: Payer: Self-pay | Admitting: *Deleted

## 2018-10-02 ENCOUNTER — Telehealth: Payer: Self-pay | Admitting: Hematology and Oncology

## 2018-10-02 ENCOUNTER — Inpatient Hospital Stay: Payer: BLUE CROSS/BLUE SHIELD

## 2018-10-02 ENCOUNTER — Inpatient Hospital Stay (HOSPITAL_BASED_OUTPATIENT_CLINIC_OR_DEPARTMENT_OTHER): Payer: BLUE CROSS/BLUE SHIELD | Admitting: Hematology and Oncology

## 2018-10-02 ENCOUNTER — Other Ambulatory Visit: Payer: Self-pay | Admitting: *Deleted

## 2018-10-02 DIAGNOSIS — Z9013 Acquired absence of bilateral breasts and nipples: Secondary | ICD-10-CM

## 2018-10-02 DIAGNOSIS — Z95828 Presence of other vascular implants and grafts: Secondary | ICD-10-CM

## 2018-10-02 DIAGNOSIS — Z5189 Encounter for other specified aftercare: Secondary | ICD-10-CM | POA: Diagnosis not present

## 2018-10-02 DIAGNOSIS — R110 Nausea: Secondary | ICD-10-CM | POA: Diagnosis not present

## 2018-10-02 DIAGNOSIS — C50412 Malignant neoplasm of upper-outer quadrant of left female breast: Secondary | ICD-10-CM

## 2018-10-02 DIAGNOSIS — Z170 Estrogen receptor positive status [ER+]: Principal | ICD-10-CM

## 2018-10-02 DIAGNOSIS — R530 Neoplastic (malignant) related fatigue: Secondary | ICD-10-CM

## 2018-10-02 DIAGNOSIS — Z5111 Encounter for antineoplastic chemotherapy: Secondary | ICD-10-CM | POA: Diagnosis not present

## 2018-10-02 LAB — CBC WITH DIFFERENTIAL (CANCER CENTER ONLY)
Abs Immature Granulocytes: 0.01 10*3/uL (ref 0.00–0.07)
BASOS ABS: 0 10*3/uL (ref 0.0–0.1)
Basophils Relative: 1 %
Eosinophils Absolute: 0.1 10*3/uL (ref 0.0–0.5)
Eosinophils Relative: 2 %
HCT: 35.2 % — ABNORMAL LOW (ref 36.0–46.0)
Hemoglobin: 11 g/dL — ABNORMAL LOW (ref 12.0–15.0)
Immature Granulocytes: 0 %
Lymphocytes Relative: 26 %
Lymphs Abs: 1 10*3/uL (ref 0.7–4.0)
MCH: 26.8 pg (ref 26.0–34.0)
MCHC: 31.3 g/dL (ref 30.0–36.0)
MCV: 85.6 fL (ref 80.0–100.0)
Monocytes Absolute: 0.1 10*3/uL (ref 0.1–1.0)
Monocytes Relative: 2 %
Neutro Abs: 2.7 10*3/uL (ref 1.7–7.7)
Neutrophils Relative %: 69 %
Platelet Count: 322 10*3/uL (ref 150–400)
RBC: 4.11 MIL/uL (ref 3.87–5.11)
RDW: 12.2 % (ref 11.5–15.5)
WBC: 3.9 10*3/uL — AB (ref 4.0–10.5)
nRBC: 0 % (ref 0.0–0.2)

## 2018-10-02 LAB — CMP (CANCER CENTER ONLY)
ALT: 10 U/L (ref 0–44)
AST: 8 U/L — ABNORMAL LOW (ref 15–41)
Albumin: 3.6 g/dL (ref 3.5–5.0)
Alkaline Phosphatase: 52 U/L (ref 38–126)
Anion gap: 9 (ref 5–15)
BUN: 8 mg/dL (ref 6–20)
CO2: 29 mmol/L (ref 22–32)
Calcium: 8.8 mg/dL — ABNORMAL LOW (ref 8.9–10.3)
Chloride: 100 mmol/L (ref 98–111)
Creatinine: 0.7 mg/dL (ref 0.44–1.00)
GFR, Est AFR Am: >60 mL/min (ref >60)
GFR, Estimated: >60 mL/min (ref >60)
Glucose, Bld: 118 mg/dL — ABNORMAL HIGH (ref 70–99)
Potassium: 3.6 mmol/L (ref 3.5–5.1)
Sodium: 138 mmol/L (ref 135–145)
Total Bilirubin: 0.3 mg/dL (ref 0.3–1.2)
Total Protein: 6.8 g/dL (ref 6.5–8.1)

## 2018-10-02 MED ORDER — HEPARIN SOD (PORK) LOCK FLUSH 100 UNIT/ML IV SOLN
500.0000 [IU] | Freq: Once | INTRAVENOUS | Status: DC | PRN
  Filled 2018-10-02: qty 5

## 2018-10-02 MED ORDER — SODIUM CHLORIDE 0.9% FLUSH
10.0000 mL | INTRAVENOUS | Status: DC | PRN
  Filled 2018-10-02: qty 10

## 2018-10-02 MED ORDER — TBO-FILGRASTIM 480 MCG/0.8ML ~~LOC~~ SOSY
480.0000 ug | PREFILLED_SYRINGE | Freq: Every day | SUBCUTANEOUS | Status: DC
  Administered 2018-10-02: 480 ug via SUBCUTANEOUS

## 2018-10-02 MED ORDER — LORAZEPAM 0.5 MG PO TABS: 0.5000 mg | ORAL_TABLET | Freq: Every evening | ORAL | 0 refills | Status: DC | PRN

## 2018-10-02 MED ORDER — TBO-FILGRASTIM 480 MCG/0.8ML ~~LOC~~ SOSY
PREFILLED_SYRINGE | SUBCUTANEOUS | Status: AC
  Filled 2018-10-02: qty 0.8

## 2018-10-02 NOTE — Telephone Encounter (Signed)
Gave avs and calendar ° °

## 2018-10-02 NOTE — Assessment & Plan Note (Signed)
08/21/2018:Bilateral mastectomies: Left mastectomy: IDC grade 3, 2.1 cm, high-grade DCIS, margins negative, negative for LV I, 3/9 lymph nodes positive, ER 95% positive, PR 90% positive, HER-2 -1+, Ki-67 30% T2N1A stage IIa right mastectomy: Benign MammaPrint: High risk luminal type B Echocardiogram 09/18/2018: EF 55 to 60% Patient has been enrolled and upbeat clinical trial: No adverse effects related to the trial  Treatment plan: 1.Adjuvant chemotherapy with dose dense Adriamycin and Cytoxan x4 followed by weekly Taxol x12 2.Followed by adjuvant antiestrogen therapy ------------------------------------------------------------------------------------------------------------------ Current Treatment: Cycle 1 day 8 Adriamycin and Cytoxan Started 09/26/18 Chemo Toxicities:   RTC in 1 week for cycle 2

## 2018-10-02 NOTE — Progress Notes (Signed)
Pt received first dose of granix injection today. Pt aware that she will need to come back for 3 more days this week to receive granix injection. Scheduling aware.

## 2018-10-03 ENCOUNTER — Inpatient Hospital Stay: Payer: BLUE CROSS/BLUE SHIELD

## 2018-10-03 VITALS — BP 122/70 | HR 75 | Temp 97.8°F | Resp 18

## 2018-10-03 DIAGNOSIS — Z170 Estrogen receptor positive status [ER+]: Principal | ICD-10-CM

## 2018-10-03 DIAGNOSIS — Z5111 Encounter for antineoplastic chemotherapy: Secondary | ICD-10-CM | POA: Diagnosis not present

## 2018-10-03 DIAGNOSIS — C50412 Malignant neoplasm of upper-outer quadrant of left female breast: Secondary | ICD-10-CM

## 2018-10-03 DIAGNOSIS — Z5189 Encounter for other specified aftercare: Secondary | ICD-10-CM | POA: Diagnosis not present

## 2018-10-03 DIAGNOSIS — Z95828 Presence of other vascular implants and grafts: Secondary | ICD-10-CM

## 2018-10-03 MED ORDER — PEGFILGRASTIM-CBQV 6 MG/0.6ML ~~LOC~~ SOSY
PREFILLED_SYRINGE | SUBCUTANEOUS | Status: AC
  Filled 2018-10-03: qty 0.6

## 2018-10-03 MED ORDER — FILGRASTIM-SNDZ 480 MCG/0.8ML IJ SOSY
480.0000 ug | PREFILLED_SYRINGE | Freq: Once | INTRAMUSCULAR | Status: AC
  Administered 2018-10-03: 480 ug via SUBCUTANEOUS
  Filled 2018-10-03: qty 0.8

## 2018-10-03 MED ORDER — PEGFILGRASTIM-CBQV 6 MG/0.6ML ~~LOC~~ SOSY: 6.0000 mg | PREFILLED_SYRINGE | Freq: Once | SUBCUTANEOUS | Status: DC

## 2018-10-04 ENCOUNTER — Inpatient Hospital Stay: Payer: BLUE CROSS/BLUE SHIELD

## 2018-10-04 DIAGNOSIS — Z5189 Encounter for other specified aftercare: Secondary | ICD-10-CM | POA: Diagnosis not present

## 2018-10-04 DIAGNOSIS — C50412 Malignant neoplasm of upper-outer quadrant of left female breast: Secondary | ICD-10-CM | POA: Diagnosis not present

## 2018-10-04 DIAGNOSIS — Z95828 Presence of other vascular implants and grafts: Secondary | ICD-10-CM

## 2018-10-04 DIAGNOSIS — Z5111 Encounter for antineoplastic chemotherapy: Secondary | ICD-10-CM | POA: Diagnosis not present

## 2018-10-04 MED ORDER — FILGRASTIM-SNDZ 480 MCG/0.8ML IJ SOSY
480.0000 ug | PREFILLED_SYRINGE | Freq: Once | INTRAMUSCULAR | Status: AC
  Administered 2018-10-04: 480 ug via SUBCUTANEOUS
  Filled 2018-10-04: qty 0.8

## 2018-10-05 ENCOUNTER — Inpatient Hospital Stay: Payer: BLUE CROSS/BLUE SHIELD

## 2018-10-05 ENCOUNTER — Ambulatory Visit: Payer: BLUE CROSS/BLUE SHIELD | Admitting: Physical Therapy

## 2018-10-05 ENCOUNTER — Encounter: Payer: Self-pay | Admitting: Physical Therapy

## 2018-10-05 DIAGNOSIS — R600 Localized edema: Secondary | ICD-10-CM

## 2018-10-05 DIAGNOSIS — Z95828 Presence of other vascular implants and grafts: Secondary | ICD-10-CM

## 2018-10-05 DIAGNOSIS — M25612 Stiffness of left shoulder, not elsewhere classified: Secondary | ICD-10-CM

## 2018-10-05 DIAGNOSIS — R293 Abnormal posture: Secondary | ICD-10-CM

## 2018-10-05 DIAGNOSIS — Z5111 Encounter for antineoplastic chemotherapy: Secondary | ICD-10-CM | POA: Diagnosis not present

## 2018-10-05 DIAGNOSIS — C50412 Malignant neoplasm of upper-outer quadrant of left female breast: Secondary | ICD-10-CM | POA: Diagnosis not present

## 2018-10-05 DIAGNOSIS — Z5189 Encounter for other specified aftercare: Secondary | ICD-10-CM | POA: Diagnosis not present

## 2018-10-05 DIAGNOSIS — Z483 Aftercare following surgery for neoplasm: Secondary | ICD-10-CM

## 2018-10-05 MED ORDER — FILGRASTIM-SNDZ 480 MCG/0.8ML IJ SOSY
480.0000 ug | PREFILLED_SYRINGE | Freq: Once | INTRAMUSCULAR | Status: AC
  Administered 2018-10-05: 480 ug via SUBCUTANEOUS
  Filled 2018-10-05: qty 0.8

## 2018-10-05 NOTE — Therapy (Signed)
[place], [place], [address] Phone: [phone]   Fax:  [phone]  Physical Therapy Treatment  Patient Details  Name: Paula Jacobs MRN: [id number] Date of Birth: 1961-02-25 Referring Provider (PT): Dr. Erroll Luna   Encounter Date: 10/05/2018  PT End of Session - 10/05/18 1039    Visit Number  3    Number of Visits  10    Date for PT Re-Evaluation  10/18/18    PT Start Time  0945    PT Stop Time  1025    PT Time Calculation (min)  40 min    Activity Tolerance  Patient tolerated treatment well    Behavior During Therapy  [facility] for tasks assessed/performed       Past Medical History:  Diagnosis Date  . ANEMIA-NOS 04/23/2008  . Aneurysm ([place]) 10/19/2017   Evaluated by MRA 10/2017, 42m, stable  . Anginal pain ([place]    went to ED in april 2018 c/o chest pain over last 2 months ; had EKG, CXR  and troponin negative per physician suspected musculoskeletal  ; dc'd with ibuprofen  and recc f/u with outpatient stress test; see care everywhere ED visit  ; patient denies recurrence of Chest pain since, endorses occ palpitations   . DEPRESSION 04/23/2008  . DJD (degenerative joint disease)   . Family history of breast cancer   . Hyperplastic colon polyp    x2  . Hypertension   . HYPOTHYROIDISM 10/17/2007  . Osteoarthritis   . Palpitations    freq at night;  at pre-op states she drinks caffeine and that makes it worse; says the palpitations started after they took her thyroid   . PAT 10/27/2009   Qualifier: Diagnosis of  By: MAundra Dubin MD, [name]    . Pneumonia    "in the past, havent had it in years"  . PONV (postoperative nausea and vomiting)    only after 1979 surgery  . RA (rheumatoid arthritis) ([place]    "problems in feet, hands, and knees"  . 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 SVT (supraventricular tachycardia) (HCC)    chronic   . Tubulovillous adenoma of colon     Past  Surgical History:  Procedure Laterality Date  . ABDOMINAL HYSTERECTOMY    . APPENDECTOMY    . BUNIONECTOMY  10/2009 right & 02/03/10 left  . COLONOSCOPY W/ POLYPECTOMY  2015  . HAMMER TOE SURGERY     "all toes have pins, done with bunionectomy"  . INGUINAL HERNIA REPAIR    . KNEE CLOSED REDUCTION Left 12/05/2017   Procedure: CLOSED MANIPULATION LEFT KNEE;  Surgeon: OParalee Cancel MD;  Location: [facility];  Service: Orthopedics;  Laterality: Left;  .Marland KitchenMASTECTOMY WITH RADIOACTIVE SEED GUIDED EXCISION AND AXILLARY SENTINEL LYMPH NODE BIOPSY Bilateral 08/21/2018   Procedure: BILATERAL SIMPLE MASTECTOMIES WITH LEFT AXILLARY RADIOACTIVE SEED GUIDED LYMPH NODE EXCISION AND LEFT AXILLARY SENTINEL LYMPH NODE BIOPSY;  Surgeon: CErroll Luna MD;  [facility]  Service: General;  Laterality: Bilateral;  . neck fusion     x3  . PORTACATH PLACEMENT Right 09/25/2018   Procedure: INSERTION PORT-A-CATH WITH ULTRASOUND ERAS PATHWAY;  Surgeon: CErroll Luna MD;  [facility]  Service: General;  Laterality: Right;  . THYROIDECTOMY  2001   for nodules  . TOE AMPUTATION  1979   6th toe removed from each foot  . TOTAL KNEE ARTHROPLASTY Left 10/21/2014   Procedure: LEFT TOTAL  KNEE ARTHROPLASTY;  Surgeon: [name], MD;  [facility]: [facility];  Service: Orthopedics;  Laterality: Left;  . TOTAL KNEE ARTHROPLASTY Right 12/05/2017   Procedure: RIGHT TOTAL KNEE ARTHROPLASTY;  Surgeon: [name], MD;  Location[facility];  Service: Orthopedics;  Laterality: Right;  70 mins    There were no vitals filed for this visit.  Subjective Assessment - 10/05/18 0954    Subjective  "Look I can take my shirt off!" My left arm hurts alot. She states she has not been able to come to treatments because of no transportation. 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 Patient  underwent a bilateral mastectomy with a left targeted node dissection (3/9 positive nodes) on 08/21/18. It is ER/PR positive and HER2 negative with a Ki67 of 30%.  She has a history of bilateral knee replacements (right in 5/19 and left in 2016) and she has had 3 cervical fusions.    Patient Stated Goals  Get my arms back to normal    Currently in Pain?  Yes    Pain Score  3     Pain Location  Arm   tender    Pain Orientation  Left    Pain Descriptors / Indicators  Tender    Pain Radiating Towards  no    Pain Onset  1 to 4 weeks ago    Aggravating Factors   get worse when she touches it.     Pain Relieving Factors  massage it          [facility] PT Assessment - 10/05/18 0001      AROM   Left Shoulder Flexion  160 Degrees                   [facility] Adult PT Treatment/Exercise - 10/05/18 0001      Self-Care   Self-Care  Other Self-Care Comments    Other Self-Care Comments   dicussed compression bras vs compression camisole and gave pt a prescription to have signed at [facility] today and the phone number to call [facility] for [facility] appt       Exercises   Exercises  Shoulder      Shoulder Exercises: Supine   Protraction  AROM;Right;Left;10 reps      Shoulder Exercises: Sidelying   ABduction  AROM;Left;5 reps    ABduction Limitations  pt feels tightness down into chest , she takes it to her pain limit, takes a deep breath and return       Manual Therapy   Manual Therapy  Manual Lymphatic Drainage (MLD)    Manual therapy comments  instructed in physiology of lymph system     Manual Lymphatic Drainage (MLD)  insupine, short neck on left side ( right side avoided due to port) superficial and deep abdominals, right axillary nodes. left inguinal nodes and left axillo-inguinal anastamosis, left chest and abdoman, left shoudler, upper arm to forearm with return along pathways, the to right sidelying, for posterior interaxillary anastamosis, back and more work to upper arm                    PT Long Term Goals - 09/20/18 1154      PT LONG TERM GOAL #1   Title  Patient will demonstrate she has regained shoulder ROM and function post operatively compared  to baseline measurements.    Time  4    Period  Weeks    Status  Partially Met      PT LONG TERM GOAL #2   Title  Patient will increase left shoulder active flexion to >/= 140 degrees for increased ease reaching.    Baseline  91 degrees    Time  4    Period  Weeks    Status  New      PT LONG TERM GOAL #3   Title  Patient will increase left shoulder active abduction to >/= 160 degrees for increased ease reaching.    Baseline  99 degrees    Time  4    Period  Weeks    Status  New      PT LONG TERM GOAL #4   Title  Patient will verbalize good understanding of lymphedema risk reduction practices.    Time  4    Period  Weeks    Status  New      PT LONG TERM GOAL #5   Title  Patient will improve her DASH score to </= 10 for improved overall left upper extremity function.    Baseline  47.7    Time  4    Period  Weeks    Status  New            Plan - 10/05/18 1040    Clinical Impression Statement  Pt has improved shoulder ROM and has met those goals, but still has swelling in left chest with fullness feeling and pain in left upper arm.  She will benefit from continued decongestion with MLD and compression and exercise     Rehab Potential  Excellent    Clinical Impairments Affecting Rehab Potential  Will begin chemo on 09/26/18    PT Frequency  2x / week    PT Duration  4 weeks    PT Treatment/Interventions  ADLs/Self Care Home Management;Therapeutic exercise;Patient/family education;Manual techniques;Manual lymph drainage;Scar mobilization;Passive range of motion;Therapeutic activities    PT Next Visit Plan  Continue with MLD and decongestion of chest and left upper arm.  Check to see if she got a compression bra/cami. Continue and progress ROM and strength of left shoulder;    PT Home  Exercise Plan  Post op shoulder ROM HEP, sidelying shoulder abduction supine protraction       Patient will benefit from skilled therapeutic intervention in order to improve the following deficits and impairments:  Decreased knowledge of precautions, Postural dysfunction, Decreased range of motion, Impaired UE functional use, Pain, Increased fascial restricitons, Decreased knowledge of use of DME, Increased edema, Decreased strength  Visit Diagnosis: Abnormal posture  Aftercare following surgery for neoplasm  Stiffness of left shoulder, not elsewhere classified  Localized edema     Problem List Patient Active Problem List   Diagnosis Date Noted  . Port-A-Cath in place 09/26/2018  . Breast cancer, left ([place]) 08/22/2018  . Breast cancer, stage 2, left ([place]) 08/21/2018  . Genetic testing 08/08/2018  . Family history of breast cancer   . Malignant neoplasm of upper-outer quadrant of left breast in female, estrogen receptor positive ([place]) 07/19/2018  . 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 Aneurysm ([place]) 10/19/2017  . Morbid obesity ([place]) 10/22/2014  . 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 Rheumatoid arthritis ([place]) 09/26/2013  . Hypertension 01/31/2011  . Hypothyroidism 10/17/2007   Donato Heinz. Owens Shark, PT  Norwood Levo 10/05/2018, 10:44 AM  [facility]  [place], [place], [address] Phone: [phone]   Fax:  [phone]  Name: Paula Jacobs MRN: [id number] Date of Birth: [birth year]

## 2018-10-05 NOTE — Patient Instructions (Signed)
First of all, check with your insurance company to see if provider is in network    [facility] (for wigs and compression sleeves / gloves/gauntlets )  [address] [place], [place] [address] [phone]  Will file some insurances --- call for appointment   [facility] (for mastectomy prosthetics and garments) [address] [place], [place] [address] [phone] Will file some insurances --- call for appointment  [facility]  [address]  , [place] [address] [phone] Lower extremity garments  [facility] [address] [place], [place]  [address] [phone]  Cathy Rubel ( Medicaid certified lymphedema fitter) [phone] [email or web address]  Melissa Meares  [facility]  [phone]  [facility] [address] [place], [place] [address] [phone]  Other Resources: National Lymphedema Network:  www.lymphnet.org www.Klosetraining.com for patient articles and self manual lymph drainage information www.lymphedemablog.com has informative articles.  www.compressionguru.com www.lymphedemaproducts.com www.brightlifedirect.com [email or web address].com 

## 2018-10-05 NOTE — Progress Notes (Signed)
Patient Care Team: [name], DO as PCP - General (Family Medicine) Hurley Cisco, MD as Consulting Physician (Rheumatology) [name], MD as Consulting Physician (Orthopedic Surgery) Erroll Luna, MD as Consulting Physician (General Surgery) Nicholas Lose, MD as Consulting Physician (Hematology and Oncology) Kyung Rudd, MD as Consulting Physician (Radiation Oncology) Maxwell Marion, RN as Registered Nurse (Medical Oncology)  DIAGNOSIS:    ICD-10-CM   1. Malignant neoplasm of upper-outer quadrant of left breast in female, estrogen receptor positive (HCC) C50.412 LORazepam (ATIVAN) 1 MG tablet   Z17.0 DISCONTINUED: fosaprepitant (EMEND) 150 mg in sodium chloride 0.9 % 145 mL IVPB    SUMMARY OF ONCOLOGIC HISTORY:   Malignant neoplasm of upper-outer quadrant of left breast in female, estrogen receptor positive ([place])   07/13/2018 Initial Diagnosis     Palpable left breast mass, 2 adjacent masses at 2 o'clock position 2 cm biopsy IDC grade 2-3, Ig DCIS, ER 95%, PR 90%, Ki-67 30%, HER-2 -1+; 2:30 position intramammary lymph node 1.3 cm biopsy positive; left axillary lymph node biopsy negative discordant, T2N1, stage IIa    07/25/2018 Cancer Staging    Staging form: Breast, AJCC 8th Edition - Clinical: Stage IIA (cT1c, cN1, cM0, G3, ER+, PR+, HER2-) - Signed by Nicholas Lose, MD on 07/25/2018    08/21/2018 Surgery    Bilateral mastectomies: Left mastectomy: IDC grade 3, 2.1 cm, high-grade DCIS, margins negative, negative for LV I, 3/9 lymph nodes positive, ER 95% positive, PR 90% positive, HER-2 -1+, Ki-67 30% T2N1A stage IIa right mastectomy: Benign    09/03/2018 Cancer Staging    Staging form: Breast, AJCC 8th Edition - Pathologic: Stage IIA (pT2, pN1a, cM0, G3, ER+, PR+, HER2-) - Signed by Nicholas Lose, MD on 09/03/2018    09/03/2018 Miscellaneous    MammaPrint: High risk luminal type B, average 10-year risk of recurrence untreated 29%, predicted 5-year benefit of  chemotherapy 94.6% distant metastasis free interval    09/26/2018 -  Chemotherapy    Adjuvant chemotherapy with dose dense Adriamycin and Cytoxan x4 followed by weekly Taxol x12     CHIEF COMPLIANT: Cycle 2 Day 1 Adriamycin and Cytoxan  INTERVAL HISTORY: Paula Jacobs is a 58 y.o. with above-mentioned history of left breast cancerwhounderwent bilateral mastectomies and is currently on chemotherapy with dose dense Adriamycin and Cytoxan. She presents to the clinic today with a friend for Cycle 2 of dose dense Adriamycin and Cytoxan. She reports she has not been sleeping well and is very frustrated, jittery, and cannot settle down and requested to take a higher dose of Ativan. She has been fatigued since not being able to sleep. She reports a history of insomnia for several years. She reviewed her medication list with me.    REVIEW OF SYSTEMS:   Constitutional: Denies fevers, chills or abnormal weight loss (+) fatigue Eyes: Denies blurriness of vision Ears, nose, mouth, throat, and face: Denies mucositis or sore throat Respiratory: Denies cough, dyspnea or wheezes Cardiovascular: Denies palpitation, chest discomfort Gastrointestinal:  Denies nausea, heartburn or change in bowel habits Skin: Denies abnormal skin rashes Lymphatics: Denies new lymphadenopathy or easy bruising Neurological: Denies numbness, tingling or new weaknesses Behavioral/Psych: (+) difficulty sleeping  Extremities: No lower extremity edema Breast: denies any pain or lumps or nodules in either breasts All other systems were reviewed with the patient and are negative.  I have reviewed the past medical history, past surgical history, social history and family history with the patient and they are unchanged from previous note.  ALLERGIES:  is allergic to hydrocodone-acetaminophen; latex; and tramadol hcl.  MEDICATIONS:  Current Outpatient Medications  Medication Sig Dispense Refill  . acetaminophen (TYLENOL) 500 MG  tablet Take 1,000 mg by mouth every 6 (six) hours as needed for moderate pain.    . benazepril-hydrochlorthiazide (LOTENSIN HCT) 10-12.5 MG tablet TAKE 1 TABLET BY MOUTH DAILY 90 tablet 0  . ibuprofen (ADVIL,MOTRIN) 800 MG tablet Take 1 tablet (800 mg total) by mouth every 8 (eight) hours as needed. 30 tablet 0  . levothyroxine (SYNTHROID, LEVOTHROID) 175 MCG tablet TAKE 1 TABLET BY MOUTH EVERY MORNING (Patient taking differently: Take 175 mcg by mouth daily before breakfast. ) 30 tablet 5  . lidocaine-prilocaine (EMLA) cream Apply to affected area once 30 g 3  . LORazepam (ATIVAN) 1 MG tablet Take 1 tablet (1 mg total) by mouth at bedtime as needed for sleep. 30 tablet 3  . ondansetron (ZOFRAN) 8 MG tablet Take 1 tablet (8 mg total) by mouth 2 (two) times daily as needed. Start on the third day after chemotherapy. 30 tablet 1  . prochlorperazine (COMPAZINE) 10 MG tablet Take 1 tablet (10 mg total) by mouth every 6 (six) hours as needed (Nausea or vomiting). 30 tablet 1   No current facility-administered medications for this visit.    Facility-Administered Medications Ordered in Other Visits  Medication Dose Route Frequency Provider Last Rate Last Dose  . cyclophosphamide (CYTOXAN) 1,220 mg in sodium chloride 0.9 % 250 mL chemo infusion  600 mg/m2 (Treatment Plan Recorded) Intravenous Once Nicholas Lose, MD      . DOXOrubicin (ADRIAMYCIN) chemo injection 122 mg  60 mg/m2 (Treatment Plan Recorded) Intravenous Once Nicholas Lose, MD   122 mg at 10/09/18 1220  . heparin lock flush 100 unit/mL  500 Units Intracatheter Once PRN Nicholas Lose, MD      . heparin lock flush 100 unit/mL  500 Units Intracatheter Once PRN Nicholas Lose, MD      . sodium chloride flush (NS) 0.9 % injection 10 mL  10 mL Intracatheter PRN Nicholas Lose, MD      . sodium chloride flush (NS) 0.9 % injection 10 mL  10 mL Intracatheter PRN Nicholas Lose, MD        PHYSICAL EXAMINATION: ECOG PERFORMANCE STATUS: 1 - Symptomatic  but completely ambulatory  Vitals:   10/09/18 1002  BP: 115/76  Pulse: 99  Resp: 16  Temp: 98.1 F (36.7 C)  SpO2: 98%   Filed Weights   10/09/18 1002  Weight: 196 lb 8 oz (89.1 kg)    GENERAL: alert, no distress and comfortable SKIN: skin color, texture, turgor are normal, no rashes or significant lesions EYES: normal, Conjunctiva are pink and non-injected, sclera clear OROPHARYNX: no exudate, no erythema and lips, buccal mucosa, and tongue normal  NECK: supple, thyroid normal size, non-tender, without nodularity LYMPH: no palpable lymphadenopathy in the cervical, axillary or inguinal LUNGS: clear to auscultation and percussion with normal breathing effort HEART: regular rate & rhythm and no murmurs and no lower extremity edema ABDOMEN: abdomen soft, non-tender and normal bowel sounds MUSCULOSKELETAL: no cyanosis of digits and no clubbing  NEURO: alert & oriented x 3 with fluent speech, no focal motor/sensory deficits EXTREMITIES: No lower extremity edema  LABORATORY DATA:  I have reviewed the data as listed CMP Latest Ref Rng & Units 10/09/2018 10/02/2018 09/26/2018  Glucose 70 - 99 mg/dL 110(H) 118(H) 101(H)  BUN 6 - 20 mg/dL '10 8 13  '$ Creatinine 0.44 - 1.00 mg/dL 0.68  0.70 0.73  Sodium 135 - 145 mmol/L 140 138 139  Potassium 3.5 - 5.1 mmol/L 4.0 3.6 3.7  Chloride 98 - 111 mmol/L 106 100 103  CO2 22 - 32 mmol/L '26 29 26  '$ Calcium 8.9 - 10.3 mg/dL 8.8(L) 8.8(L) 9.0  Total Protein 6.5 - 8.1 g/dL 6.4(L) 6.8 6.9  Total Bilirubin 0.3 - 1.2 mg/dL <0.2(L) 0.3 0.3  Alkaline Phos 38 - 126 U/L 57 52 55  AST 15 - 41 U/L 17 8(L) 14(L)  ALT 0 - 44 U/L '16 10 13    '$ Lab Results  Component Value Date   WBC 7.5 10/09/2018   HGB 10.3 (L) 10/09/2018   HCT 32.9 (L) 10/09/2018   MCV 87.5 10/09/2018   PLT 208 10/09/2018   NEUTROABS 2.7 10/09/2018    ASSESSMENT & PLAN:  Malignant neoplasm of upper-outer quadrant of left breast in female, estrogen receptor positive  ([place]) 08/21/2018:Bilateral mastectomies: Left mastectomy: IDC grade 3, 2.1 cm, high-grade DCIS, margins negative, negative for LV I, 3/9 lymph nodes positive, ER 95% positive, PR 90% positive, HER-2 -1+, Ki-67 30% T2N1A stage IIa right mastectomy: Benign MammaPrint: High risk luminal type B Echocardiogram 09/18/2018: EF 55 to 60% Patient has been enrolled and upbeat clinical trial: No adverse effects related to the trial  Treatment plan: 1.Adjuvant chemotherapy with dose dense Adriamycin and Cytoxan x4 followed by weekly Taxol x12 2.Followed by adjuvant antiestrogen therapy ------------------------------------------------------------------------------------------------------------------ Current Treatment: Cycle 2 day 1 Adriamycin and Cytoxan Started 09/26/18 Chemo Toxicities:  1.  Nausea: Mild to moderate 2.  Patient forgot to come for her [place] appointment with cycle 1.  We  gave her Granix for 4 days. 3.  Severe fatigue: Much improved 4.  Chemotherapy-induced anemia: Hemoglobin 10.3: We will watch and monitor the number.  RTC in 2 weeks for cycle 3  No orders of the defined types were placed in this encounter.  The patient has a good understanding of the overall plan. she agrees with it. she will call with any problems that may develop before the next visit here.  Nicholas Lose, MD 10/09/2018  Julious Oka Dorshimer am acting as scribe for Dr. Nicholas Lose.  I have reviewed the above documentation for accuracy and completeness, and I agree with the above.

## 2018-10-08 ENCOUNTER — Ambulatory Visit: Payer: BLUE CROSS/BLUE SHIELD

## 2018-10-08 DIAGNOSIS — Z483 Aftercare following surgery for neoplasm: Secondary | ICD-10-CM

## 2018-10-08 DIAGNOSIS — C50412 Malignant neoplasm of upper-outer quadrant of left female breast: Secondary | ICD-10-CM | POA: Diagnosis not present

## 2018-10-08 DIAGNOSIS — Z5189 Encounter for other specified aftercare: Secondary | ICD-10-CM | POA: Diagnosis not present

## 2018-10-08 DIAGNOSIS — R600 Localized edema: Secondary | ICD-10-CM

## 2018-10-08 DIAGNOSIS — Z5111 Encounter for antineoplastic chemotherapy: Secondary | ICD-10-CM | POA: Diagnosis not present

## 2018-10-08 DIAGNOSIS — R293 Abnormal posture: Secondary | ICD-10-CM

## 2018-10-08 DIAGNOSIS — M25612 Stiffness of left shoulder, not elsewhere classified: Secondary | ICD-10-CM

## 2018-10-08 NOTE — Therapy (Addendum)
[place], [place], [address] Phone: [phone]   Fax:  [phone]  Physical Therapy Treatment  Patient Details  Name: Paula Jacobs MRN: [id number] Date of Birth: 05/24/1961 Referring Provider (PT): Dr. Erroll Luna   Encounter Date: 10/08/2018  PT End of Session - 10/08/18 1220    Visit Number  4    Number of Visits  10    Date for PT Re-Evaluation  10/18/18    PT Start Time  1010    PT Stop Time  1103    PT Time Calculation (min)  53 min    Activity Tolerance  Patient tolerated treatment well    Behavior During Therapy  [facility] for tasks assessed/performed       Past Medical History:  Diagnosis Date  . ANEMIA-NOS 04/23/2008  . Aneurysm ([place]) 10/19/2017   Evaluated by [facility] 10/2017, 69m, stable  . Anginal pain ([place]    went to ED in april 2018 c/o chest pain over last 2 months ; had EKG, CXR  and troponin negative per physician suspected musculoskeletal  ; dc'd with ibuprofen  and recc f/u with outpatient stress test; see care everywhere ED visit  ; patient denies recurrence of Chest pain since, endorses occ palpitations   . DEPRESSION 04/23/2008  . DJD (degenerative joint disease)   . Family history of breast cancer   . Hyperplastic colon polyp    x2  . Hypertension   . HYPOTHYROIDISM 10/17/2007  . Osteoarthritis   . Palpitations    freq at night;  at pre-op states she drinks caffeine and that makes it worse; says the palpitations started after they took her thyroid   . PAT 10/27/2009   Qualifier: Diagnosis of  By: MAundra Dubin MD, [name]    . Pneumonia    "in the past, havent had it in years"  . PONV (postoperative nausea and vomiting)    only after 1979 surgery  . RA (rheumatoid arthritis) ([facility]    "problems in feet, hands, and knees"  . 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 SVT (supraventricular tachycardia) ([facility])    chronic   . Tubulovillous adenoma of colon     Past  Surgical History:  Procedure Laterality Date  . ABDOMINAL HYSTERECTOMY    . APPENDECTOMY    . BUNIONECTOMY  10/2009 right & 02/03/10 left  . COLONOSCOPY W/ POLYPECTOMY  2015  . HAMMER TOE SURGERY     "all toes have pins, done with bunionectomy"  . INGUINAL HERNIA REPAIR    . KNEE CLOSED REDUCTION Left 12/05/2017   Procedure: CLOSED MANIPULATION LEFT KNEE;  Surgeon: OParalee Cancel MD;  Location: [facility];  Service: Orthopedics;  Laterality: Left;  .Marland KitchenMASTECTOMY WITH RADIOACTIVE SEED GUIDED EXCISION AND AXILLARY SENTINEL LYMPH NODE BIOPSY Bilateral 08/21/2018   Procedure: BILATERAL SIMPLE MASTECTOMIES WITH LEFT AXILLARY RADIOACTIVE SEED GUIDED LYMPH NODE EXCISION AND LEFT AXILLARY SENTINEL LYMPH NODE BIOPSY;  Surgeon: CErroll Luna MD;  [place]  Service: General;  Laterality: Bilateral;  . neck fusion     x3  . PORTACATH PLACEMENT Right 09/25/2018   Procedure: INSERTION PORT-A-CATH WITH ULTRASOUND ERAS PATHWAY;  Surgeon: CErroll Luna MD;  [facility] [place]  Service: General;  Laterality: Right;  . THYROIDECTOMY  2001   for nodules  . TOE AMPUTATION  1979   6th toe removed from each foot  . TOTAL KNEE ARTHROPLASTY Left 10/21/2014   Procedure: LEFT TOTAL  KNEE ARTHROPLASTY;  Surgeon: [name], MD;  Location: [facility];  Service: Orthopedics;  Laterality: Left;  . TOTAL KNEE ARTHROPLASTY Right 12/05/2017   Procedure: RIGHT TOTAL KNEE ARTHROPLASTY;  Surgeon: [name], MD;  Location: [facility];  Service: Orthopedics;  Laterality: Right;  70 mins    There were no vitals filed for this visit.  Subjective Assessment - 10/08/18 1012    Subjective  My Lt arm bothered me alot this weekend. I've been doing my exercises and my ROM is greatly improved, it's just the pain is not. The worse the swelling is I think the worse my pain is.     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 Patient underwent a  bilateral mastectomy with a left targeted node dissection (3/9 positive nodes) on 08/21/18. It is ER/PR positive and HER2 negative with a Ki67 of 30%.  She has a history of bilateral knee replacements (right in 5/19 and left in 2016) and she has had 3 cervical fusions.    Patient Stated Goals  Get my arms back to normal    Currently in Pain?  Yes    Pain Score  6     Pain Location  Arm    Pain Orientation  Left;Lower   forearm   Pain Descriptors / Indicators  Sharp;Stabbing    Pain Type  Surgical pain    Pain Onset  1 to 4 weeks ago    Pain Frequency  Intermittent    Aggravating Factors   swelling makes it worse    Pain Relieving Factors  nothing really                       [facility] Adult PT Treatment/Exercise - 10/08/18 0001      Manual Therapy   Manual Therapy  Manual Lymphatic Drainage (MLD);Passive ROM;Myofascial release    Myofascial Release  To Lt axilla where cording is visible and had pt reach around with Rt hand to show her where cording is; also gentle UE pulling to during P/ROM    Manual Lymphatic Drainage (MLD)  In supine: short neck on left side (right side avoided due to port) superficial and deep abdominals, right axillary nodes. anterior inter-axillary anastomosis, left inguinal nodes and left axillo-inguinal anastomosis, left chest and abdomen, left shoulder, upper arm to forearm with return along pathways, then to right sidelying, for posterior interaxillary anastamosis, back and more work to upper arm; then finished in supine retracing anastomosis.     Passive ROM  In Supine to Lt shoulder into flexion, abduction and D2 to pts tolerance                  PT Long Term Goals - 09/20/18 1154      PT LONG TERM GOAL #1   Title  Patient will demonstrate she has regained shoulder ROM and function post operatively compared to baseline measurements.    Time  4    Period  Weeks    Status  Partially Met      PT LONG TERM GOAL #2   Title  Patient will  increase left shoulder active flexion to >/= 140 degrees for increased ease reaching.    Baseline  91 degrees    Time  4    Period  Weeks    Status  New      PT LONG TERM GOAL #3   Title  Patient will  increase left shoulder active abduction to >/= 160 degrees for increased ease reaching.    Baseline  99 degrees    Time  4    Period  Weeks    Status  New      PT LONG TERM GOAL #4   Title  Patient will verbalize good understanding of lymphedema risk reduction practices.    Time  4    Period  Weeks    Status  New      PT LONG TERM GOAL #5   Title  Patient will improve her DASH score to </= 10 for improved overall left upper extremity function.    Baseline  47.7    Time  4    Period  Weeks    Status  New            Plan - 10/08/18 1221    Clinical Impression Statement  Pt with palpable and visible cording today at end of P/ROM so added treatment of this with myofascial release and P/ROM. Instructed pt how to duplicate this at home with stretching in doorway and using contralateral hand to perform myofascial release to Lt axilla. Also continued with manual lymph drainage to Lt upper quadrant. At end of session pt reported her pain in Lt arm is much improved at end of session. Did not call [facility] yet but plans to soon.     Rehab Potential  Excellent    Clinical Impairments Affecting Rehab Potential  Will begin chemo on 09/26/18    PT Frequency  2x / week    PT Duration  4 weeks    PT Treatment/Interventions  ADLs/Self Care Home Management;Therapeutic exercise;Patient/family education;Manual techniques;Manual lymph drainage;Scar mobilization;Passive range of motion;Therapeutic activities    PT Next Visit Plan  Continue with MLD and decongestion of chest and left upper arm.  Check to see if she got a compression bra/cami. Continue and progress ROM and strength of left shoulder;    Consulted and Agree with Plan of Care  Patient       Patient will benefit from skilled  therapeutic intervention in order to improve the following deficits and impairments:  Decreased knowledge of precautions, Postural dysfunction, Decreased range of motion, Impaired UE functional use, Pain, Increased fascial restricitons, Decreased knowledge of use of DME, Increased edema, Decreased strength  Visit Diagnosis: Abnormal posture  Aftercare following surgery for neoplasm  Stiffness of left shoulder, not elsewhere classified  Localized edema     Problem List Patient Active Problem List   Diagnosis Date Noted  . Port-A-Cath in place 09/26/2018  . Breast cancer, left ([place]) 08/22/2018  . Breast cancer, stage 2, left ([place]) 08/21/2018  . Genetic testing 08/08/2018  . Family history of breast cancer   . Malignant neoplasm of upper-outer quadrant of left breast in female, estrogen receptor positive ([place]) 07/19/2018  . 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 Aneurysm ([place]) 10/19/2017  . Morbid obesity ([place]) 10/22/2014  . 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 Rheumatoid arthritis ([place]) 09/26/2013  . Hypertension 01/31/2011  . Hypothyroidism 10/17/2007    Otelia Limes, PTA 10/08/2018, 12:26 PM  [place], [place], [address] Phone: [phone]   Fax:  [phone]  Name: Paula Jacobs MRN: [id number] Date of Birth: [birth year]  PHYSICAL THERAPY DISCHARGE SUMMARY  Visits from Start of Care: 4  Current functional level related to goals / functional outcomes: unknown   Remaining deficits: unknown   Education /  Equipment: Lymphedema management, home exercise Plan: Patient agrees to discharge.  Patient goals were partially met. Patient is being discharged due to not returning since the last visit.  ?????    Maudry Diego, PT 12/10/18 2:21 PM

## 2018-10-09 ENCOUNTER — Inpatient Hospital Stay: Payer: BLUE CROSS/BLUE SHIELD

## 2018-10-09 ENCOUNTER — Inpatient Hospital Stay (HOSPITAL_BASED_OUTPATIENT_CLINIC_OR_DEPARTMENT_OTHER): Payer: BLUE CROSS/BLUE SHIELD | Admitting: Hematology and Oncology

## 2018-10-09 DIAGNOSIS — R110 Nausea: Secondary | ICD-10-CM

## 2018-10-09 DIAGNOSIS — R530 Neoplastic (malignant) related fatigue: Secondary | ICD-10-CM

## 2018-10-09 DIAGNOSIS — C50412 Malignant neoplasm of upper-outer quadrant of left female breast: Secondary | ICD-10-CM

## 2018-10-09 DIAGNOSIS — Z9013 Acquired absence of bilateral breasts and nipples: Secondary | ICD-10-CM

## 2018-10-09 DIAGNOSIS — Z5189 Encounter for other specified aftercare: Secondary | ICD-10-CM | POA: Diagnosis not present

## 2018-10-09 DIAGNOSIS — Z87891 Personal history of nicotine dependence: Secondary | ICD-10-CM

## 2018-10-09 DIAGNOSIS — T451X5A Adverse effect of antineoplastic and immunosuppressive drugs, initial encounter: Secondary | ICD-10-CM

## 2018-10-09 DIAGNOSIS — G4700 Insomnia, unspecified: Secondary | ICD-10-CM

## 2018-10-09 DIAGNOSIS — Z95828 Presence of other vascular implants and grafts: Secondary | ICD-10-CM

## 2018-10-09 DIAGNOSIS — Z170 Estrogen receptor positive status [ER+]: Principal | ICD-10-CM

## 2018-10-09 DIAGNOSIS — Z5111 Encounter for antineoplastic chemotherapy: Secondary | ICD-10-CM | POA: Diagnosis not present

## 2018-10-09 DIAGNOSIS — D6481 Anemia due to antineoplastic chemotherapy: Secondary | ICD-10-CM | POA: Diagnosis not present

## 2018-10-09 LAB — CMP (CANCER CENTER ONLY)
ALT: 16 U/L (ref 0–44)
ANION GAP: 8 (ref 5–15)
AST: 17 U/L (ref 15–41)
Albumin: 3.4 g/dL — ABNORMAL LOW (ref 3.5–5.0)
Alkaline Phosphatase: 57 U/L (ref 38–126)
BUN: 10 mg/dL (ref 6–20)
CO2: 26 mmol/L (ref 22–32)
Calcium: 8.8 mg/dL — ABNORMAL LOW (ref 8.9–10.3)
Chloride: 106 mmol/L (ref 98–111)
Creatinine: 0.68 mg/dL (ref 0.44–1.00)
GFR, Est AFR Am: >60 mL/min (ref >60)
Glucose, Bld: 110 mg/dL — ABNORMAL HIGH (ref 70–99)
Potassium: 4 mmol/L (ref 3.5–5.1)
Sodium: 140 mmol/L (ref 135–145)
Total Bilirubin: <0.2 mg/dL — ABNORMAL LOW (ref 0.3–1.2)
Total Protein: 6.4 g/dL — ABNORMAL LOW (ref 6.5–8.1)

## 2018-10-09 LAB — CBC WITH DIFFERENTIAL (CANCER CENTER ONLY)
ABS IMMATURE GRANULOCYTES: 2.18 10*3/uL — AB (ref 0.00–0.07)
Basophils Absolute: 0 10*3/uL (ref 0.0–0.1)
Basophils Relative: 1 %
Eosinophils Absolute: 0 10*3/uL (ref 0.0–0.5)
Eosinophils Relative: 0 %
HCT: 32.9 % — ABNORMAL LOW (ref 36.0–46.0)
Hemoglobin: 10.3 g/dL — ABNORMAL LOW (ref 12.0–15.0)
Immature Granulocytes: 29 %
Lymphocytes Relative: 21 %
Lymphs Abs: 1.6 10*3/uL (ref 0.7–4.0)
MCH: 27.4 pg (ref 26.0–34.0)
MCHC: 31.3 g/dL (ref 30.0–36.0)
MCV: 87.5 fL (ref 80.0–100.0)
Monocytes Absolute: 1 10*3/uL (ref 0.1–1.0)
Monocytes Relative: 13 %
NEUTROS ABS: 2.7 10*3/uL (ref 1.7–7.7)
Neutrophils Relative %: 36 %
Platelet Count: 208 10*3/uL (ref 150–400)
RBC: 3.76 MIL/uL — ABNORMAL LOW (ref 3.87–5.11)
RDW: 13.2 % (ref 11.5–15.5)
WBC Count: 7.5 10*3/uL (ref 4.0–10.5)
nRBC: 0.8 % — ABNORMAL HIGH (ref 0.0–0.2)

## 2018-10-09 MED ORDER — PALONOSETRON HCL INJECTION 0.25 MG/5ML
0.2500 mg | Freq: Once | INTRAVENOUS | Status: AC
  Administered 2018-10-09: 0.25 mg via INTRAVENOUS

## 2018-10-09 MED ORDER — SODIUM CHLORIDE 0.9% FLUSH
10.0000 mL | INTRAVENOUS | Status: DC | PRN
  Administered 2018-10-09: 10 mL
  Filled 2018-10-09: qty 10

## 2018-10-09 MED ORDER — PALONOSETRON HCL INJECTION 0.25 MG/5ML
INTRAVENOUS | Status: AC
  Filled 2018-10-09: qty 5

## 2018-10-09 MED ORDER — HEPARIN SOD (PORK) LOCK FLUSH 100 UNIT/ML IV SOLN
500.0000 [IU] | Freq: Once | INTRAVENOUS | Status: AC | PRN
  Administered 2018-10-09: 500 [IU]
  Filled 2018-10-09: qty 5

## 2018-10-09 MED ORDER — LORAZEPAM 1 MG PO TABS: 1.0000 mg | ORAL_TABLET | Freq: Every evening | ORAL | 3 refills | Status: DC | PRN

## 2018-10-09 MED ORDER — SODIUM CHLORIDE 0.9 % IV SOLN
Freq: Once | INTRAVENOUS | Status: AC
  Administered 2018-10-09: 11:00:00 via INTRAVENOUS
  Filled 2018-10-09: qty 250

## 2018-10-09 MED ORDER — SODIUM CHLORIDE 0.9 % IV SOLN
Freq: Once | INTRAVENOUS | Status: AC
  Administered 2018-10-09: 11:00:00 via INTRAVENOUS
  Filled 2018-10-09: qty 5

## 2018-10-09 MED ORDER — SODIUM CHLORIDE 0.9 % IV SOLN
600.0000 mg/m2 | Freq: Once | INTRAVENOUS | Status: AC
  Administered 2018-10-09: 1220 mg via INTRAVENOUS
  Filled 2018-10-09: qty 61

## 2018-10-09 MED ORDER — DOXORUBICIN HCL CHEMO IV INJECTION 2 MG/ML
60.0000 mg/m2 | Freq: Once | INTRAVENOUS | Status: AC
  Administered 2018-10-09: 122 mg via INTRAVENOUS
  Filled 2018-10-09: qty 61

## 2018-10-09 NOTE — Assessment & Plan Note (Signed)
08/21/2018:Bilateral mastectomies: Left mastectomy: IDC grade 3, 2.1 cm, high-grade DCIS, margins negative, negative for LV I, 3/9 lymph nodes positive, ER 95% positive, PR 90% positive, HER-2 -1+, Ki-67 30% T2N1A stage IIa right mastectomy: Benign MammaPrint: High risk luminal type B Echocardiogram 09/18/2018: EF 55 to 60% Patient has been enrolled and upbeat clinical trial: No adverse effects related to the trial  Treatment plan: 1.Adjuvant chemotherapy with dose dense Adriamycin and Cytoxan x4 followed by weekly Taxol x12 2.Followed by adjuvant antiestrogen therapy ------------------------------------------------------------------------------------------------------------------ Current Treatment: Cycle 2 day 1 Adriamycin and Cytoxan Started 09/26/18 Chemo Toxicities:  1.  Nausea: Mild to moderate 2.  Patient forgot to come for her [facility] appointment.  We gave her Granix for 4 days. 3.  Severe fatigue:  RTC in 2 weeks for cycle 3

## 2018-10-09 NOTE — Patient Instructions (Signed)

## 2018-10-11 ENCOUNTER — Inpatient Hospital Stay: Payer: BLUE CROSS/BLUE SHIELD

## 2018-10-11 ENCOUNTER — Ambulatory Visit: Payer: BLUE CROSS/BLUE SHIELD

## 2018-10-11 VITALS — BP 121/72 | HR 87 | Temp 98.6°F | Resp 18

## 2018-10-11 DIAGNOSIS — C50412 Malignant neoplasm of upper-outer quadrant of left female breast: Secondary | ICD-10-CM | POA: Diagnosis not present

## 2018-10-11 DIAGNOSIS — Z170 Estrogen receptor positive status [ER+]: Principal | ICD-10-CM

## 2018-10-11 DIAGNOSIS — Z5111 Encounter for antineoplastic chemotherapy: Secondary | ICD-10-CM | POA: Diagnosis not present

## 2018-10-11 DIAGNOSIS — Z5189 Encounter for other specified aftercare: Secondary | ICD-10-CM | POA: Diagnosis not present

## 2018-10-11 MED ORDER — PEGFILGRASTIM-CBQV 6 MG/0.6ML ~~LOC~~ SOSY
6.0000 mg | PREFILLED_SYRINGE | Freq: Once | SUBCUTANEOUS | Status: AC
  Administered 2018-10-11: 6 mg via SUBCUTANEOUS

## 2018-10-11 MED ORDER — PEGFILGRASTIM-CBQV 6 MG/0.6ML ~~LOC~~ SOSY
PREFILLED_SYRINGE | SUBCUTANEOUS | Status: AC
  Filled 2018-10-11: qty 0.6

## 2018-10-15 ENCOUNTER — Ambulatory Visit: Payer: BLUE CROSS/BLUE SHIELD

## 2018-10-18 ENCOUNTER — Telehealth: Payer: Self-pay

## 2018-10-18 ENCOUNTER — Ambulatory Visit: Payer: BLUE CROSS/BLUE SHIELD

## 2018-10-18 ENCOUNTER — Other Ambulatory Visit: Payer: Self-pay | Admitting: Hematology and Oncology

## 2018-10-18 ENCOUNTER — Other Ambulatory Visit: Payer: Self-pay

## 2018-10-18 DIAGNOSIS — Z170 Estrogen receptor positive status [ER+]: Principal | ICD-10-CM

## 2018-10-18 DIAGNOSIS — C50412 Malignant neoplasm of upper-outer quadrant of left female breast: Secondary | ICD-10-CM

## 2018-10-18 MED ORDER — PROCHLORPERAZINE MALEATE 10 MG PO TABS: 10.0000 mg | ORAL_TABLET | Freq: Four times a day (QID) | ORAL | 1 refills | Status: DC | PRN

## 2018-10-18 NOTE — Telephone Encounter (Signed)
Called and left pt 2nd message this week requesting she call us back as she has missed both appointments this week and we have yet to hear from her. We need to know if she plans to cont coming to PT as she has no future appts with [place] at this time.

## 2018-10-22 ENCOUNTER — Other Ambulatory Visit: Payer: Self-pay | Admitting: Medical Oncology

## 2018-10-22 ENCOUNTER — Telehealth: Payer: Self-pay | Admitting: Medical Oncology

## 2018-10-22 DIAGNOSIS — Z170 Estrogen receptor positive status [ER+]: Principal | ICD-10-CM

## 2018-10-22 DIAGNOSIS — C50412 Malignant neoplasm of upper-outer quadrant of left female breast: Secondary | ICD-10-CM

## 2018-10-22 NOTE — Telephone Encounter (Signed)
LVMOM with patient regarding the [employer] [id number] study. Inquired with patient if she would be interested in participating with this study, informed her that this is a voluntary study which only collects data, mostly from her medical record. I provided patient with a brief overview of study and informed her that I will touch base with her when she is in clinic tomorrow for her scheduled appointment, unless of course she calls back today to let me know that she is not interested in participation. Patient thanked for her time. Return contact information provided.  Maxwell Marion, RN, BSN, [facility] Clinical Research 10/22/2018 2:08 PM

## 2018-10-23 ENCOUNTER — Inpatient Hospital Stay (HOSPITAL_BASED_OUTPATIENT_CLINIC_OR_DEPARTMENT_OTHER): Payer: BLUE CROSS/BLUE SHIELD | Admitting: Adult Health

## 2018-10-23 ENCOUNTER — Encounter: Payer: Self-pay | Admitting: Adult Health

## 2018-10-23 ENCOUNTER — Inpatient Hospital Stay: Payer: BLUE CROSS/BLUE SHIELD

## 2018-10-23 ENCOUNTER — Inpatient Hospital Stay: Payer: BLUE CROSS/BLUE SHIELD | Attending: Hematology and Oncology

## 2018-10-23 ENCOUNTER — Encounter: Payer: Self-pay | Admitting: Medical Oncology

## 2018-10-23 VITALS — BP 124/83 | HR 93 | Temp 98.6°F | Resp 18 | Ht 62.0 in | Wt 193.7 lb

## 2018-10-23 DIAGNOSIS — Z79899 Other long term (current) drug therapy: Secondary | ICD-10-CM

## 2018-10-23 DIAGNOSIS — C50412 Malignant neoplasm of upper-outer quadrant of left female breast: Secondary | ICD-10-CM

## 2018-10-23 DIAGNOSIS — Z803 Family history of malignant neoplasm of breast: Secondary | ICD-10-CM | POA: Insufficient documentation

## 2018-10-23 DIAGNOSIS — Z9013 Acquired absence of bilateral breasts and nipples: Secondary | ICD-10-CM | POA: Diagnosis not present

## 2018-10-23 DIAGNOSIS — Z5189 Encounter for other specified aftercare: Secondary | ICD-10-CM | POA: Diagnosis not present

## 2018-10-23 DIAGNOSIS — Z170 Estrogen receptor positive status [ER+]: Secondary | ICD-10-CM

## 2018-10-23 DIAGNOSIS — Z5111 Encounter for antineoplastic chemotherapy: Secondary | ICD-10-CM | POA: Diagnosis not present

## 2018-10-23 DIAGNOSIS — Z87891 Personal history of nicotine dependence: Secondary | ICD-10-CM | POA: Diagnosis not present

## 2018-10-23 DIAGNOSIS — Z95828 Presence of other vascular implants and grafts: Secondary | ICD-10-CM

## 2018-10-23 LAB — CBC WITH DIFFERENTIAL (CANCER CENTER ONLY)
Abs Immature Granulocytes: 2.21 10*3/uL — ABNORMAL HIGH (ref 0.00–0.07)
Basophils Absolute: 0.1 10*3/uL (ref 0.0–0.1)
Basophils Relative: 1 %
EOS ABS: 0 10*3/uL (ref 0.0–0.5)
Eosinophils Relative: 0 %
HEMATOCRIT: 33.4 % — AB (ref 36.0–46.0)
Hemoglobin: 10.4 g/dL — ABNORMAL LOW (ref 12.0–15.0)
Immature Granulocytes: 19 %
Lymphocytes Relative: 13 %
Lymphs Abs: 1.5 10*3/uL (ref 0.7–4.0)
MCH: 27.1 pg (ref 26.0–34.0)
MCHC: 31.1 g/dL (ref 30.0–36.0)
MCV: 87 fL (ref 80.0–100.0)
Monocytes Absolute: 0.8 10*3/uL (ref 0.1–1.0)
Monocytes Relative: 7 %
Neutro Abs: 7.1 10*3/uL (ref 1.7–7.7)
Neutrophils Relative %: 60 %
Platelet Count: 464 10*3/uL — ABNORMAL HIGH (ref 150–400)
RBC: 3.84 MIL/uL — ABNORMAL LOW (ref 3.87–5.11)
RDW: 14.4 % (ref 11.5–15.5)
WBC Count: 11.6 10*3/uL — ABNORMAL HIGH (ref 4.0–10.5)
nRBC: 0.3 % — ABNORMAL HIGH (ref 0.0–0.2)

## 2018-10-23 LAB — CMP (CANCER CENTER ONLY)
ALT: 11 U/L (ref 0–44)
AST: 14 U/L — ABNORMAL LOW (ref 15–41)
Albumin: 3.8 g/dL (ref 3.5–5.0)
Alkaline Phosphatase: 65 U/L (ref 38–126)
Anion gap: 9 (ref 5–15)
BILIRUBIN TOTAL: 0.3 mg/dL (ref 0.3–1.2)
BUN: 7 mg/dL (ref 6–20)
CALCIUM: 9 mg/dL (ref 8.9–10.3)
CO2: 26 mmol/L (ref 22–32)
Chloride: 106 mmol/L (ref 98–111)
Creatinine: 0.73 mg/dL (ref 0.44–1.00)
GFR, Est AFR Am: >60 mL/min (ref >60)
GFR, Estimated: >60 mL/min (ref >60)
Glucose, Bld: 124 mg/dL — ABNORMAL HIGH (ref 70–99)
Potassium: 3.9 mmol/L (ref 3.5–5.1)
Sodium: 141 mmol/L (ref 135–145)
Total Protein: 6.9 g/dL (ref 6.5–8.1)

## 2018-10-23 LAB — RESEARCH LABS

## 2018-10-23 MED ORDER — SODIUM CHLORIDE 0.9% FLUSH
10.0000 mL | INTRAVENOUS | Status: DC | PRN
  Administered 2018-10-23: 10 mL
  Filled 2018-10-23: qty 10

## 2018-10-23 MED ORDER — SODIUM CHLORIDE 0.9 % IV SOLN
600.0000 mg/m2 | Freq: Once | INTRAVENOUS | Status: AC
  Administered 2018-10-23: 1220 mg via INTRAVENOUS
  Filled 2018-10-23: qty 61

## 2018-10-23 MED ORDER — PALONOSETRON HCL INJECTION 0.25 MG/5ML
0.2500 mg | Freq: Once | INTRAVENOUS | Status: AC
  Administered 2018-10-23: 0.25 mg via INTRAVENOUS

## 2018-10-23 MED ORDER — SODIUM CHLORIDE 0.9 % IV SOLN
Freq: Once | INTRAVENOUS | Status: AC
  Administered 2018-10-23: 11:00:00 via INTRAVENOUS
  Filled 2018-10-23: qty 5

## 2018-10-23 MED ORDER — DOXORUBICIN HCL CHEMO IV INJECTION 2 MG/ML
60.0000 mg/m2 | Freq: Once | INTRAVENOUS | Status: AC
  Administered 2018-10-23: 122 mg via INTRAVENOUS
  Filled 2018-10-23: qty 61

## 2018-10-23 MED ORDER — SODIUM CHLORIDE 0.9 % IV SOLN
Freq: Once | INTRAVENOUS | Status: AC
  Administered 2018-10-23: 11:00:00 via INTRAVENOUS
  Filled 2018-10-23: qty 250

## 2018-10-23 MED ORDER — PALONOSETRON HCL INJECTION 0.25 MG/5ML
INTRAVENOUS | Status: AC
  Filled 2018-10-23: qty 5

## 2018-10-23 MED ORDER — HEPARIN SOD (PORK) LOCK FLUSH 100 UNIT/ML IV SOLN
500.0000 [IU] | Freq: Once | INTRAVENOUS | Status: AC | PRN
  Administered 2018-10-23: 500 [IU]
  Filled 2018-10-23: qty 5

## 2018-10-23 NOTE — Patient Instructions (Signed)
Cancer Center Discharge Instructions for Patients Receiving Chemotherapy  Today you received the following chemotherapy agents Adriamycin and Cytoxan  To help prevent nausea and vomiting after your treatment, we encourage you to take your nausea medication as directed.  If you develop nausea and vomiting that is not controlled by your nausea medication, call the clinic.   BELOW ARE SYMPTOMS THAT SHOULD BE REPORTED IMMEDIATELY:  *FEVER GREATER THAN 100.5 F  *CHILLS WITH OR WITHOUT FEVER  NAUSEA AND VOMITING THAT IS NOT CONTROLLED WITH YOUR NAUSEA MEDICATION  *UNUSUAL SHORTNESS OF BREATH  *UNUSUAL BRUISING OR BLEEDING  TENDERNESS IN MOUTH AND THROAT WITH OR WITHOUT PRESENCE OF ULCERS  *URINARY PROBLEMS  *BOWEL PROBLEMS  UNUSUAL RASH Items with * indicate a potential emergency and should be followed up as soon as possible.  Feel free to call the clinic should you have any questions or concerns. The clinic phone number is [phone].  Please show the CHEMO ALERT CARD at check-in to the Emergency Department and triage nurse.   

## 2018-10-23 NOTE — Progress Notes (Signed)
Paula Jacobs Cancer Follow up:    Paula Kern, DO [address] [place] [address]   DIAGNOSIS: Cancer Staging Malignant neoplasm of upper-outer quadrant of left breast in female, estrogen receptor positive ([name]) Staging form: Breast, AJCC 8th Edition - Clinical: Stage IIA (cT1c, cN1, cM0, G3, ER+, PR+, HER2-) - Signed by Nicholas Lose, MD on 07/25/2018 - Pathologic: Stage IIA (pT2, pN1a, cM0, G3, ER+, PR+, HER2-) - Signed by Nicholas Lose, MD on 09/03/2018   SUMMARY OF ONCOLOGIC HISTORY:   Malignant neoplasm of upper-outer quadrant of left breast in female, estrogen receptor positive ([name])   07/13/2018 Initial Diagnosis     Palpable left breast mass, 2 adjacent masses at 2 o'clock position 2 cm biopsy IDC grade 2-3, Ig DCIS, ER 95%, PR 90%, Ki-67 30%, HER-2 -1+; 2:30 position intramammary lymph node 1.3 cm biopsy positive; left axillary lymph node biopsy negative discordant, T2N1, stage IIa    07/25/2018 Cancer Staging    Staging form: Breast, AJCC 8th Edition - Clinical: Stage IIA (cT1c, cN1, cM0, G3, ER+, PR+, HER2-) - Signed by Nicholas Lose, MD on 07/25/2018    08/21/2018 Surgery    Bilateral mastectomies: Left mastectomy: IDC grade 3, 2.1 cm, high-grade DCIS, margins negative, negative for LV I, 3/9 lymph nodes positive, ER 95% positive, PR 90% positive, HER-2 -1+, Ki-67 30% T2N1A stage IIa right mastectomy: Benign    09/03/2018 Cancer Staging    Staging form: Breast, AJCC 8th Edition - Pathologic: Stage IIA (pT2, pN1a, cM0, G3, ER+, PR+, HER2-) - Signed by Nicholas Lose, MD on 09/03/2018    09/03/2018 Miscellaneous    MammaPrint: High risk luminal type B, average 10-year risk of recurrence untreated 29%, predicted 5-year benefit of chemotherapy 94.6% distant metastasis free interval    09/26/2018 -  Chemotherapy    Adjuvant chemotherapy with dose dense Adriamycin and Cytoxan x4 followed by weekly Taxol x12     CURRENT THERAPY: Adriamycin/Cytoxan  cycle 3 day 1  INTERVAL HISTORY: Paula Jacobs 58 y.o. female returns for evaluation prior to receiving her third cycle of adjuvant chemotherapy.  She is fatigued.  She was nauseated and that resolved with her anti emetics.  She thinks she might have slight swelling in her left arm.  She plans on getting a compression sleeve today.     Patient Active Problem List   Diagnosis Date Noted  . Port-A-Cath in place 09/26/2018  . Breast cancer, left ([name]) 08/22/2018  . Breast cancer, stage 2, left ([name]) 08/21/2018  . Genetic testing 08/08/2018  . Family history of breast cancer   . Malignant neoplasm of upper-outer quadrant of left breast in female, estrogen receptor positive ([name]) 07/19/2018  . 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 Aneurysm ([facility]) 10/19/2017  . Morbid obesity ([facility]) 10/22/2014  . 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 Rheumatoid arthritis ([facility]) 09/26/2013  . Hypertension 01/31/2011  . Hypothyroidism 10/17/2007    is allergic to hydrocodone-acetaminophen; latex; and tramadol hcl.  MEDICAL HISTORY: Past Medical History:  Diagnosis Date  . ANEMIA-NOS 04/23/2008  . Aneurysm ([facility]) 10/19/2017   Evaluated by [facility] 10/2017, 40m, stable  . [place]    went to ED in april 2018 c/o chest pain over last 2 months ; had EKG, CXR  and troponin negative per physician suspected musculoskeletal  ; dc'd with ibuprofen  and recc f/u with outpatient stress test; see care everywhere ED visit  ; patient denies recurrence of Chest pain since, endorses occ palpitations   .  DEPRESSION 04/23/2008  . DJD (degenerative joint disease)   . Family history of breast cancer   . Hyperplastic colon polyp    x2  . Hypertension   . HYPOTHYROIDISM 10/17/2007  . Osteoarthritis   . Palpitations    freq at night;  at pre-op states she drinks caffeine and that makes it worse; says the palpitations started after they took her thyroid   . PAT 10/27/2009   Qualifier: Diagnosis of  By: Paula Dubin, MD, [name]    . Pneumonia     "in the past, havent had it in years"  . PONV (postoperative nausea and vomiting)    only after 1979 surgery  . RA (rheumatoid arthritis) ([facility])    "problems in feet, hands, and knees"  . 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 SVT (supraventricular tachycardia) ([facility])    chronic   . Tubulovillous adenoma of colon     SURGICAL HISTORY: Past Surgical History:  Procedure Laterality Date  . ABDOMINAL HYSTERECTOMY    . APPENDECTOMY    . BUNIONECTOMY  10/2009 right & 02/03/10 left  . COLONOSCOPY W/ POLYPECTOMY  2015  . HAMMER TOE SURGERY     "all toes have pins, done with bunionectomy"  . INGUINAL HERNIA REPAIR    . KNEE CLOSED REDUCTION Left 12/05/2017   Procedure: CLOSED MANIPULATION LEFT KNEE;  Surgeon: [name], MD;  Location: [facility];  Service: Orthopedics;  Laterality: Left;  Marland Kitchen MASTECTOMY WITH RADIOACTIVE SEED GUIDED EXCISION AND AXILLARY SENTINEL LYMPH NODE BIOPSY Bilateral 08/21/2018   Procedure: BILATERAL SIMPLE MASTECTOMIES WITH LEFT AXILLARY RADIOACTIVE SEED GUIDED LYMPH NODE EXCISION AND LEFT AXILLARY SENTINEL LYMPH NODE BIOPSY;  Surgeon: Erroll Luna, MD;  Location: [facility];  Service: General;  Laterality: Bilateral;  . neck fusion     x3  . PORTACATH PLACEMENT Right 09/25/2018   Procedure: INSERTION PORT-A-CATH WITH ULTRASOUND ERAS PATHWAY;  Surgeon: Erroll Luna, MD;  Location: [facility];  Service: General;  Laterality: Right;  . THYROIDECTOMY  2001   for nodules  . TOE AMPUTATION  1979   6th toe removed from each foot  . TOTAL KNEE ARTHROPLASTY Left 10/21/2014   Procedure: LEFT TOTAL KNEE ARTHROPLASTY;  Surgeon: [name], MD;  Location: [facility];  Service: Orthopedics;  Laterality: Left;  . TOTAL KNEE ARTHROPLASTY Right 12/05/2017   Procedure: RIGHT TOTAL KNEE ARTHROPLASTY;  Surgeon: [name], MD;  Location: [facility];  Service: Orthopedics;  Laterality: Right;  70 mins    SOCIAL HISTORY: Social History   Socioeconomic History  .  Marital status: Married    Spouse name: Not on file  . Number of children: 2  . Years of education: 26  . Highest education level: Not on file  Occupational History    Employer: [employer]  Social Needs  . Financial resource strain: Not on file  . Food insecurity:    Worry: Not on file    Inability: Not on file  . Transportation needs:    Medical: Not on file    Non-medical: Not on file  Tobacco Use  . Smoking status: Former Smoker    Packs/day: 0.50    Years: 33.00    Pack years: 16.50    Last attempt to quit: 09/15/2011    Years since quitting: 7.1  . Smokeless tobacco: Never Used  Substance and Sexual Activity  . Alcohol use: Yes    Comment: rare  . Drug use: No  . Sexual  activity: Not on file  Lifestyle  . Physical activity:    Days per week: Not on file    Minutes per session: Not on file  . Stress: Not on file  Relationships  . Social connections:    Talks on phone: Not on file    Gets together: Not on file    Attends religious service: Not on file    Active member of club or organization: Not on file    Attends meetings of clubs or organizations: Not on file    Relationship status: Not on file  . Intimate partner violence:    Fear of current or ex partner: Not on file    Emotionally abused: Not on file    Physically abused: Not on file    Forced sexual activity: Not on file  Other Topics Concern  . 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 Married - 2006  1 son '92  1 dtr '94. Work - at [employer].    FAMILY HISTORY: Family History  Problem Relation Age of Onset  . Breast cancer Mother 72       metastatic to brain, died at 27  . Dementia Father        died at 71  . Heart attack Maternal Grandmother   . Stroke Maternal Grandmother   . Diabetes Maternal Grandmother   . Heart disease Maternal Grandmother   . Thyroid disease Maternal Grandmother   . COPD Maternal Aunt   . Cancer Paternal Uncle        details  unk  . Esophageal cancer Neg Hx   . Rectal cancer Neg Hx   . Stomach cancer Neg Hx     Review of Systems  Constitutional: Positive for fatigue. Negative for appetite change, chills and unexpected weight change.  HENT:   Negative for hearing loss, lump/mass, mouth sores, sore throat, trouble swallowing and voice change.   Eyes: Negative for eye problems and icterus.  Respiratory: Negative for chest tightness, cough and shortness of breath.   Cardiovascular: Negative for chest pain, leg swelling and palpitations.  Gastrointestinal: Negative for abdominal distention, abdominal pain, constipation, diarrhea, nausea and vomiting.  Endocrine: Negative for hot flashes.  Musculoskeletal: Negative for arthralgias.  Skin: Negative for itching and rash.  Neurological: Negative for dizziness, extremity weakness, headaches and numbness.  Hematological: Negative for adenopathy. Does not bruise/bleed easily.  Psychiatric/Behavioral: Negative for depression. The patient is not nervous/anxious.       PHYSICAL EXAMINATION  ECOG PERFORMANCE STATUS: 1 - Symptomatic but completely ambulatory  Vitals:   10/23/18 1012  BP: 124/83  Pulse: 93  Resp: 18  Temp: 98.6 F (37 C)  SpO2: 100%    Physical Exam Constitutional:      General: She is not in acute distress.    Appearance: Normal appearance. She is not toxic-appearing.  HENT:     Head: Normocephalic and atraumatic.     Mouth/Throat:     Mouth: Mucous membranes are moist.     Pharynx: Oropharynx is clear. No oropharyngeal exudate or posterior oropharyngeal erythema.  Eyes:     General: No scleral icterus.    Pupils: Pupils are equal, round, and reactive to light.  Neck:     Musculoskeletal: Neck supple.  Cardiovascular:     Rate and Rhythm: Normal rate and regular rhythm.     Pulses: Normal pulses.     Heart sounds: Normal heart sounds.  Comments: Breasts s/p bilateral mastectomies, no sign of local recurrence, left arm ? Slightly  swollen Pulmonary:     Effort: Pulmonary effort is normal.     Breath sounds: Normal breath sounds.  Abdominal:     General: Abdomen is flat. Bowel sounds are normal. There is no distension.     Palpations: Abdomen is soft. There is no mass.     Tenderness: There is no abdominal tenderness.  Musculoskeletal:        General: No swelling.  Skin:    General: Skin is warm and dry.     Capillary Refill: Capillary refill takes less than 2 seconds.  Neurological:     General: No focal deficit present.     Mental Status: She is alert.  Psychiatric:        Mood and Affect: Mood normal.        Behavior: Behavior normal.     LABORATORY DATA:  CBC    Component Value Date/Time   WBC 11.6 (H) 10/23/2018 0847   WBC 8.7 09/24/2018 1627   RBC 3.84 (L) 10/23/2018 0847   HGB 10.4 (L) 10/23/2018 0847   HCT 33.4 (L) 10/23/2018 0847   PLT 464 (H) 10/23/2018 0847   MCV 87.0 10/23/2018 0847   MCH 27.1 10/23/2018 0847   MCHC 31.1 10/23/2018 0847   RDW 14.4 10/23/2018 0847   LYMPHSABS 1.5 10/23/2018 0847   MONOABS 0.8 10/23/2018 0847   EOSABS 0.0 10/23/2018 0847   BASOSABS 0.1 10/23/2018 0847    CMP     Component Value Date/Time   NA 141 10/23/2018 0847   K 3.9 10/23/2018 0847   CL 106 10/23/2018 0847   CO2 26 10/23/2018 0847   GLUCOSE 124 (H) 10/23/2018 0847   BUN 7 10/23/2018 0847   CREATININE 0.73 10/23/2018 0847   CALCIUM 9.0 10/23/2018 0847   PROT 6.9 10/23/2018 0847   ALBUMIN 3.8 10/23/2018 0847   AST 14 (L) 10/23/2018 0847   ALT 11 10/23/2018 0847   ALKPHOS 65 10/23/2018 0847   BILITOT 0.3 10/23/2018 0847   GFRNONAA >60 10/23/2018 0847   GFRAA >60 10/23/2018 0847       ASSESSMENT and PLAN:   Malignant neoplasm of upper-outer quadrant of left breast in female, estrogen receptor positive ([facility]) 08/21/2018:Bilateral mastectomies: Left mastectomy: IDC grade 3, 2.1 cm, high-grade DCIS, margins negative, negative for LV I, 3/9 lymph nodes positive, ER 95% positive, PR 90%  positive, HER-2 -1+, Ki-67 30% T2N1A stage IIa right mastectomy: Benign MammaPrint: High risk luminal type B Echocardiogram 09/18/2018: EF 55 to 60% Patient has been enrolled and upbeat clinical trial: No adverse effects related to the trial  Treatment plan: 1.Adjuvant chemotherapy with dose dense Adriamycin and Cytoxan x4 followed by weekly Taxol x12 2.Followed by adjuvant antiestrogen therapy ------------------------------------------------------------------------------------------------------------------ Current Treatment: Cycle 3 day 1 Adriamycin and Cytoxan  Chemo Toxicities:  1.  Nausea: Mild to moderate, controlled with anti emetics 2.  Severe fatigue: reviewed brief periods of exercise 3. Left arm swelling: recommended PT, will consider  I wrote her a script for prostheses and bras today for her to take to get fitted.    RTC in 2 weeks for cycle 4   All questions were answered. The patient knows to call the clinic with any problems, questions or concerns. We can certainly see the patient much sooner if necessary.  A total of (20) minutes of face-to-face time was spent with this patient with greater than 50% of that time in counseling  and care-coordination.  This note was electronically signed. Scot Dock, NP 10/23/2018

## 2018-10-23 NOTE — Patient Instructions (Signed)

## 2018-10-23 NOTE — Assessment & Plan Note (Addendum)
08/21/2018:Bilateral mastectomies: Left mastectomy: IDC grade 3, 2.1 cm, high-grade DCIS, margins negative, negative for LV I, 3/9 lymph nodes positive, ER 95% positive, PR 90% positive, HER-2 -1+, Ki-67 30% T2N1A stage IIa right mastectomy: Benign MammaPrint: High risk luminal type B Echocardiogram 09/18/2018: EF 55 to 60% Patient has been enrolled and upbeat clinical trial: No adverse effects related to the trial  Treatment plan: 1.Adjuvant chemotherapy with dose dense Adriamycin and Cytoxan x4 followed by weekly Taxol x12 2.Followed by adjuvant antiestrogen therapy ------------------------------------------------------------------------------------------------------------------ Current Treatment: Cycle 3 day 1 Adriamycin and Cytoxan  Chemo Toxicities:  1.  Nausea: Mild to moderate, controlled with anti emetics 2.  Severe fatigue: reviewed brief periods of exercise 3. Left arm swelling: recommended PT, will consider  I wrote her a script for prostheses and bras today for her to take to get fitted.    RTC in 2 weeks for cycle 4

## 2018-10-23 NOTE — Progress Notes (Signed)
DCP-001 (Consent signing) I met with patient this morning, here in clinic with her sister, for her scheduled treatment today. I explained to patient what this study is about, "Use of a Clinical Trial Screening Tool to Address Cancer Health Disparities in the [place] Program," and its purpose and informed her that participation is voluntary and inquired as to whether she had any questions. Patient denied questions. Patient and I reviewed the study authorization and consent forms page by page. Patient is aware that this study involves a one-time consent and collection of demographic variables with the majority of data collected from her medical record. I noted to patient that no patient identifiers are being reported via the screening tool. After all of patient's questions were answered to her satisfaction, patient agreed to participate. The consent and authorization forms were signed and dated by the patient. Patient was provided with copies of both forms for her record. Patient was thanked for her time and support of study and encouraged to call Dr. Lindi Adie or myself with questions.  Patient meets eligibility and will be enrolled in the DCP-001 study. Maxwell Marion, RN, BSN, [facility] Clinical Research 10/23/2018 11:59 AM

## 2018-10-25 ENCOUNTER — Inpatient Hospital Stay: Payer: BLUE CROSS/BLUE SHIELD

## 2018-10-25 ENCOUNTER — Encounter: Payer: Self-pay | Admitting: Gastroenterology

## 2018-10-25 DIAGNOSIS — Z170 Estrogen receptor positive status [ER+]: Secondary | ICD-10-CM | POA: Diagnosis not present

## 2018-10-25 DIAGNOSIS — Z79899 Other long term (current) drug therapy: Secondary | ICD-10-CM | POA: Diagnosis not present

## 2018-10-25 DIAGNOSIS — Z803 Family history of malignant neoplasm of breast: Secondary | ICD-10-CM | POA: Diagnosis not present

## 2018-10-25 DIAGNOSIS — C50412 Malignant neoplasm of upper-outer quadrant of left female breast: Secondary | ICD-10-CM | POA: Diagnosis not present

## 2018-10-25 DIAGNOSIS — Z5189 Encounter for other specified aftercare: Secondary | ICD-10-CM | POA: Diagnosis not present

## 2018-10-25 DIAGNOSIS — Z9013 Acquired absence of bilateral breasts and nipples: Secondary | ICD-10-CM | POA: Diagnosis not present

## 2018-10-25 DIAGNOSIS — Z5111 Encounter for antineoplastic chemotherapy: Secondary | ICD-10-CM | POA: Diagnosis not present

## 2018-10-25 DIAGNOSIS — Z87891 Personal history of nicotine dependence: Secondary | ICD-10-CM | POA: Diagnosis not present

## 2018-10-25 MED ORDER — PEGFILGRASTIM-CBQV 6 MG/0.6ML ~~LOC~~ SOSY
6.0000 mg | PREFILLED_SYRINGE | Freq: Once | SUBCUTANEOUS | Status: AC
  Administered 2018-10-25: 6 mg via SUBCUTANEOUS

## 2018-10-25 MED ORDER — PEGFILGRASTIM-CBQV 6 MG/0.6ML ~~LOC~~ SOSY
PREFILLED_SYRINGE | SUBCUTANEOUS | Status: AC
  Filled 2018-10-25: qty 0.6

## 2018-10-25 NOTE — Telephone Encounter (Signed)
Created in error

## 2018-10-29 ENCOUNTER — Encounter: Payer: Self-pay | Admitting: Gastroenterology

## 2018-10-30 ENCOUNTER — Telehealth: Payer: Self-pay | Admitting: *Deleted

## 2018-10-30 NOTE — Telephone Encounter (Signed)
Left VM with patient regarding update on disability papers being faxed over this morning.

## 2018-10-30 NOTE — Progress Notes (Signed)
Continuing Disability Claim successfully faxed to [place] at [phone].

## 2018-10-31 NOTE — Progress Notes (Signed)
Patient Care Team: [name], DO as PCP - General (Family Medicine) Hurley Cisco, MD as Consulting Physician (Rheumatology) [name], MD as Consulting Physician (Orthopedic Surgery) Erroll Luna, MD as Consulting Physician (General Surgery) Nicholas Lose, MD as Consulting Physician (Hematology and Oncology) Kyung Rudd, MD as Consulting Physician (Radiation Oncology) Maxwell Marion, RN as Registered Nurse (Medical Oncology)  DIAGNOSIS:    ICD-10-CM   1. Malignant neoplasm of upper-outer quadrant of left breast in female, estrogen receptor positive ([place]) C50.412    Z17.0     SUMMARY OF ONCOLOGIC HISTORY:   Malignant neoplasm of upper-outer quadrant of left breast in female, estrogen receptor positive ([place])   07/13/2018 Initial Diagnosis     Palpable left breast mass, 2 adjacent masses at 2 o'clock position 2 cm biopsy IDC grade 2-3, Ig DCIS, ER 95%, PR 90%, Ki-67 30%, HER-2 -1+; 2:30 position intramammary lymph node 1.3 cm biopsy positive; left axillary lymph node biopsy negative discordant, T2N1, stage IIa    07/25/2018 Cancer Staging    Staging form: Breast, AJCC 8th Edition - Clinical: Stage IIA (cT1c, cN1, cM0, G3, ER+, PR+, HER2-) - Signed by Nicholas Lose, MD on 07/25/2018    08/21/2018 Surgery    Bilateral mastectomies: Left mastectomy: IDC grade 3, 2.1 cm, high-grade DCIS, margins negative, negative for LV I, 3/9 lymph nodes positive, ER 95% positive, PR 90% positive, HER-2 -1+, Ki-67 30% T2N1A stage IIa right mastectomy: Benign    09/03/2018 Cancer Staging    Staging form: Breast, AJCC 8th Edition - Pathologic: Stage IIA (pT2, pN1a, cM0, G3, ER+, PR+, HER2-) - Signed by Nicholas Lose, MD on 09/03/2018    09/03/2018 Miscellaneous    MammaPrint: High risk luminal type B, average 10-year risk of recurrence untreated 29%, predicted 5-year benefit of chemotherapy 94.6% distant metastasis free interval    09/26/2018 -  Chemotherapy    Adjuvant chemotherapy  with dose dense Adriamycin and Cytoxan x4 followed by weekly Taxol x12     CHIEF COMPLIANT: Cycle 4 Day 1 Adriamycin and Cytoxan  INTERVAL HISTORY: Paula Jacobs is a 58 y.o. with above-mentioned history of left breast cancerwhounderwent bilateral mastectomiesand is currently on chemotherapy with dose dense Adriamycin and Cytoxan.She presents to the clinic todaywith her friend for Cycle 4. She notes increased fatigue and nausea for 3-4 days following treatment despite taking Zofran and Compazine but denies vomiting. She notes darkening of the skin on her hands and the bottom of her foot. She asked if she could go back to work while on Taxol treatments because she is going stir crazy. She asked if she could get radiation at a hospital in [place], [place] near where she works. Her labs from today show: WBC 16.5, Hg 9.8, platelets 360.  REVIEW OF SYSTEMS:   Constitutional: Denies fevers, chills or abnormal weight loss (+) fatigue Eyes: Denies blurriness of vision Ears, nose, mouth, throat, and face: Denies mucositis or sore throat Respiratory: Denies cough, dyspnea or wheezes Cardiovascular: Denies palpitation, chest discomfort Gastrointestinal: Denies heartburn or change in bowel habits (+) nausea Skin: Denies abnormal skin rashes (+) darkening of skin on hands and feet Lymphatics: Denies new lymphadenopathy or easy bruising Neurological: Denies numbness, tingling or new weaknesses Behavioral/Psych: Mood is stable, no new changes  Extremities: No lower extremity edema Breast: denies any pain or lumps or nodules in either breasts All other systems were reviewed with the patient and are negative.  I have reviewed the past medical history, past surgical history, social history  and family history with the patient and they are unchanged from previous note.  ALLERGIES:  is allergic to hydrocodone-acetaminophen; latex; and tramadol hcl.  MEDICATIONS:  Current Outpatient Medications    Medication Sig Dispense Refill  . acetaminophen (TYLENOL) 500 MG tablet Take 1,000 mg by mouth every 6 (six) hours as needed for moderate pain.    . benazepril-hydrochlorthiazide (LOTENSIN HCT) 10-12.5 MG tablet TAKE 1 TABLET BY MOUTH DAILY 90 tablet 0  . ibuprofen (ADVIL,MOTRIN) 800 MG tablet Take 1 tablet (800 mg total) by mouth every 8 (eight) hours as needed. 30 tablet 0  . levothyroxine (SYNTHROID, LEVOTHROID) 175 MCG tablet TAKE 1 TABLET BY MOUTH EVERY MORNING (Patient taking differently: Take 175 mcg by mouth daily before breakfast. ) 30 tablet 5  . lidocaine-prilocaine (EMLA) cream Apply to affected area once 30 g 3  . LORazepam (ATIVAN) 1 MG tablet Take 1 tablet (1 mg total) by mouth at bedtime as needed for sleep. 30 tablet 3  . ondansetron (ZOFRAN) 8 MG tablet Take 1 tablet (8 mg total) by mouth 2 (two) times daily as needed. Start on the third day after chemotherapy. (Patient not taking: Reported on 10/23/2018) 30 tablet 1  . prochlorperazine (COMPAZINE) 10 MG tablet Take 1 tablet (10 mg total) by mouth every 6 (six) hours as needed (Nausea or vomiting). 30 tablet 1   No current facility-administered medications for this visit.    Facility-Administered Medications Ordered in Other Visits  Medication Dose Route Frequency Provider Last Rate Last Dose  . cyclophosphamide (CYTOXAN) 1,220 mg in sodium chloride 0.9 % 250 mL chemo infusion  600 mg/m2 (Treatment Plan Recorded) Intravenous Once Nicholas Lose, MD      . DOXOrubicin (ADRIAMYCIN) chemo injection 122 mg  60 mg/m2 (Treatment Plan Recorded) Intravenous Once Nicholas Lose, MD      . fosaprepitant (EMEND) 150 mg in sodium chloride 0.9 % 145 mL IVPB   Intravenous Once Nicholas Lose, MD      . heparin lock flush 100 unit/mL  500 Units Intracatheter Once PRN Nicholas Lose, MD      . heparin lock flush 100 unit/mL  500 Units Intracatheter Once PRN Nicholas Lose, MD      . sodium chloride flush (NS) 0.9 % injection 10 mL  10 mL  Intracatheter PRN Nicholas Lose, MD      . sodium chloride flush (NS) 0.9 % injection 10 mL  10 mL Intracatheter PRN Nicholas Lose, MD        PHYSICAL EXAMINATION: ECOG PERFORMANCE STATUS: 1 - Symptomatic but completely ambulatory  Vitals:   11/06/18 1108  BP: 134/75  Pulse: 87  Resp: 18  Temp: 98.4 F (36.9 C)  SpO2: 100%   Filed Weights   11/06/18 1108  Weight: 195 lb 1.6 oz (88.5 kg)    GENERAL: alert, no distress and comfortable SKIN: skin color, texture, turgor are normal, no rashes or significant lesions EYES: normal, Conjunctiva are pink and non-injected, sclera clear OROPHARYNX: no exudate, no erythema and lips, buccal mucosa, and tongue normal  NECK: supple, thyroid normal size, non-tender, without nodularity LYMPH: no palpable lymphadenopathy in the cervical, axillary or inguinal LUNGS: clear to auscultation and percussion with normal breathing effort HEART: regular rate & rhythm and no murmurs and no lower extremity edema ABDOMEN: abdomen soft, non-tender and normal bowel sounds MUSCULOSKELETAL: no cyanosis of digits and no clubbing  NEURO: alert & oriented x 3 with fluent speech, no focal motor/sensory deficits EXTREMITIES: No lower extremity edema  LABORATORY  DATA:  I have reviewed the data as listed CMP Latest Ref Rng & Units 11/06/2018 10/23/2018 10/09/2018  Glucose 70 - 99 mg/dL 98 124(H) 110(H)  BUN 6 - 20 mg/dL '7 7 10  '$ Creatinine 0.44 - 1.00 mg/dL 0.66 0.73 0.68  Sodium 135 - 145 mmol/L 138 141 140  Potassium 3.5 - 5.1 mmol/L 3.4(L) 3.9 4.0  Chloride 98 - 111 mmol/L 102 106 106  CO2 22 - 32 mmol/L '26 26 26  '$ Calcium 8.9 - 10.3 mg/dL 8.7(L) 9.0 8.8(L)  Total Protein 6.5 - 8.1 g/dL 6.7 6.9 6.4(L)  Total Bilirubin 0.3 - 1.2 mg/dL 0.2(L) 0.3 <0.2(L)  Alkaline Phos 38 - 126 U/L 68 65 57  AST 15 - 41 U/L 18 14(L) 17  ALT 0 - 44 U/L '10 11 16    '$ Lab Results  Component Value Date   WBC 16.5 (H) 11/06/2018   HGB 9.8 (L) 11/06/2018   HCT 30.8 (L) 11/06/2018    MCV 86.0 11/06/2018   PLT 360 11/06/2018   NEUTROABS 9.4 (H) 11/06/2018    ASSESSMENT & PLAN:  Malignant neoplasm of upper-outer quadrant of left breast in female, estrogen receptor positive ([place]) 08/21/2018:Bilateral mastectomies: Left mastectomy: IDC grade 3, 2.1 cm, high-grade DCIS, margins negative, negative for LV I, 3/9 lymph nodes positive, ER 95% positive, PR 90% positive, HER-2 -1+, Ki-67 30% T2N1A stage IIa right mastectomy: Benign MammaPrint: High risk luminal type B Echocardiogram 09/18/2018: EF 55 to 60% Patient has been enrolled and upbeat clinical trial: No adverse effects related to the trial  Treatment plan: 1.Adjuvant chemotherapy with dose dense Adriamycin and Cytoxan x4 followed by weekly Taxol x12 2.Followed by adjuvant antiestrogen therapy ------------------------------------------------------------------------------------------------------------------ Current Treatment: Cycle  4 day 1 Adriamycin and Cytoxan  Chemo Toxicities: 1.Nausea: Mild to moderate, controlled with anti emetics 2.Severe fatigue: reviewed brief periods of exercise 3. Left arm swelling: better 4.  Skin discoloration of hands and feet  Patient would like to go back to working during Taxol treatments.  I believe she can do that.   RTC in 2 weeks for cycle 1 Taxol    No orders of the defined types were placed in this encounter.  The patient has a good understanding of the overall plan. she agrees with it. she will call with any problems that may develop before the next visit here.  Nicholas Lose, MD 11/06/2018  Julious Oka Dorshimer am acting as scribe for Dr. Nicholas Lose.  I have reviewed the above documentation for accuracy and completeness, and I agree with the above.

## 2018-11-06 ENCOUNTER — Inpatient Hospital Stay (HOSPITAL_BASED_OUTPATIENT_CLINIC_OR_DEPARTMENT_OTHER): Payer: BLUE CROSS/BLUE SHIELD | Admitting: Hematology and Oncology

## 2018-11-06 ENCOUNTER — Inpatient Hospital Stay: Payer: BLUE CROSS/BLUE SHIELD

## 2018-11-06 DIAGNOSIS — Z9013 Acquired absence of bilateral breasts and nipples: Secondary | ICD-10-CM | POA: Diagnosis not present

## 2018-11-06 DIAGNOSIS — Z170 Estrogen receptor positive status [ER+]: Principal | ICD-10-CM

## 2018-11-06 DIAGNOSIS — Z95828 Presence of other vascular implants and grafts: Secondary | ICD-10-CM

## 2018-11-06 DIAGNOSIS — C50412 Malignant neoplasm of upper-outer quadrant of left female breast: Secondary | ICD-10-CM | POA: Diagnosis not present

## 2018-11-06 DIAGNOSIS — Z5189 Encounter for other specified aftercare: Secondary | ICD-10-CM | POA: Diagnosis not present

## 2018-11-06 DIAGNOSIS — Z79899 Other long term (current) drug therapy: Secondary | ICD-10-CM | POA: Diagnosis not present

## 2018-11-06 DIAGNOSIS — Z5111 Encounter for antineoplastic chemotherapy: Secondary | ICD-10-CM | POA: Diagnosis not present

## 2018-11-06 DIAGNOSIS — Z803 Family history of malignant neoplasm of breast: Secondary | ICD-10-CM | POA: Diagnosis not present

## 2018-11-06 DIAGNOSIS — Z87891 Personal history of nicotine dependence: Secondary | ICD-10-CM

## 2018-11-06 LAB — CMP (CANCER CENTER ONLY)
ALT: 10 U/L (ref 0–44)
AST: 18 U/L (ref 15–41)
Albumin: 3.8 g/dL (ref 3.5–5.0)
Alkaline Phosphatase: 68 U/L (ref 38–126)
Anion gap: 10 (ref 5–15)
BILIRUBIN TOTAL: 0.2 mg/dL — AB (ref 0.3–1.2)
BUN: 7 mg/dL (ref 6–20)
CO2: 26 mmol/L (ref 22–32)
Calcium: 8.7 mg/dL — ABNORMAL LOW (ref 8.9–10.3)
Chloride: 102 mmol/L (ref 98–111)
Creatinine: 0.66 mg/dL (ref 0.44–1.00)
GFR, Est AFR Am: >60 mL/min (ref >60)
Glucose, Bld: 98 mg/dL (ref 70–99)
Potassium: 3.4 mmol/L — ABNORMAL LOW (ref 3.5–5.1)
Sodium: 138 mmol/L (ref 135–145)
TOTAL PROTEIN: 6.7 g/dL (ref 6.5–8.1)

## 2018-11-06 LAB — CBC WITH DIFFERENTIAL (CANCER CENTER ONLY)
ABS IMMATURE GRANULOCYTES: 4.12 10*3/uL — AB (ref 0.00–0.07)
Basophils Absolute: 0.1 10*3/uL (ref 0.0–0.1)
Basophils Relative: 1 %
Eosinophils Absolute: 0.1 10*3/uL (ref 0.0–0.5)
Eosinophils Relative: 1 %
HCT: 30.8 % — ABNORMAL LOW (ref 36.0–46.0)
Hemoglobin: 9.8 g/dL — ABNORMAL LOW (ref 12.0–15.0)
Immature Granulocytes: 25 %
Lymphocytes Relative: 7 %
Lymphs Abs: 1.2 10*3/uL (ref 0.7–4.0)
MCH: 27.4 pg (ref 26.0–34.0)
MCHC: 31.8 g/dL (ref 30.0–36.0)
MCV: 86 fL (ref 80.0–100.0)
Monocytes Absolute: 1.6 10*3/uL — ABNORMAL HIGH (ref 0.1–1.0)
Monocytes Relative: 9 %
NEUTROS ABS: 9.4 10*3/uL — AB (ref 1.7–7.7)
Neutrophils Relative %: 57 %
Platelet Count: 360 10*3/uL (ref 150–400)
RBC: 3.58 MIL/uL — ABNORMAL LOW (ref 3.87–5.11)
RDW: 15.2 % (ref 11.5–15.5)
WBC Count: 16.5 10*3/uL — ABNORMAL HIGH (ref 4.0–10.5)
nRBC: 0.3 % — ABNORMAL HIGH (ref 0.0–0.2)

## 2018-11-06 MED ORDER — SODIUM CHLORIDE 0.9 % IV SOLN
Freq: Once | INTRAVENOUS | Status: AC
  Administered 2018-11-06: 12:00:00 via INTRAVENOUS
  Filled 2018-11-06: qty 5

## 2018-11-06 MED ORDER — SODIUM CHLORIDE 0.9% FLUSH
10.0000 mL | INTRAVENOUS | Status: DC | PRN
  Administered 2018-11-06: 10 mL
  Filled 2018-11-06: qty 10

## 2018-11-06 MED ORDER — SODIUM CHLORIDE 0.9 % IV SOLN
600.0000 mg/m2 | Freq: Once | INTRAVENOUS | Status: AC
  Administered 2018-11-06: 1220 mg via INTRAVENOUS
  Filled 2018-11-06: qty 61

## 2018-11-06 MED ORDER — HEPARIN SOD (PORK) LOCK FLUSH 100 UNIT/ML IV SOLN
500.0000 [IU] | Freq: Once | INTRAVENOUS | Status: AC | PRN
  Administered 2018-11-06: 500 [IU]
  Filled 2018-11-06: qty 5

## 2018-11-06 MED ORDER — PALONOSETRON HCL INJECTION 0.25 MG/5ML
INTRAVENOUS | Status: AC
  Filled 2018-11-06: qty 5

## 2018-11-06 MED ORDER — DOXORUBICIN HCL CHEMO IV INJECTION 2 MG/ML
60.0000 mg/m2 | Freq: Once | INTRAVENOUS | Status: AC
  Administered 2018-11-06: 122 mg via INTRAVENOUS
  Filled 2018-11-06: qty 61

## 2018-11-06 MED ORDER — SODIUM CHLORIDE 0.9 % IV SOLN
Freq: Once | INTRAVENOUS | Status: AC
  Administered 2018-11-06: 12:00:00 via INTRAVENOUS
  Filled 2018-11-06: qty 250

## 2018-11-06 MED ORDER — PALONOSETRON HCL INJECTION 0.25 MG/5ML
0.2500 mg | Freq: Once | INTRAVENOUS | Status: AC
  Administered 2018-11-06: 0.25 mg via INTRAVENOUS

## 2018-11-06 NOTE — Patient Instructions (Signed)
[facility] Discharge Instructions for Patients Receiving Chemotherapy  Today you received the following chemotherapy agents Adriamycin and Cytoxan  To help prevent nausea and vomiting after your treatment, we encourage you to take your nausea medication as directed.  If you develop nausea and vomiting that is not controlled by your nausea medication, call the clinic.   BELOW ARE SYMPTOMS THAT SHOULD BE REPORTED IMMEDIATELY:  *FEVER GREATER THAN 100.5 F  *CHILLS WITH OR WITHOUT FEVER  NAUSEA AND VOMITING THAT IS NOT CONTROLLED WITH YOUR NAUSEA MEDICATION  *UNUSUAL SHORTNESS OF BREATH  *UNUSUAL BRUISING OR BLEEDING  TENDERNESS IN MOUTH AND THROAT WITH OR WITHOUT PRESENCE OF ULCERS  *URINARY PROBLEMS  *BOWEL PROBLEMS  UNUSUAL RASH Items with * indicate a potential emergency and should be followed up as soon as possible.  Feel free to call the clinic should you have any questions or concerns. The clinic phone number is [phone].  Please show the CHEMO ALERT CARD at check-in to the Emergency Department and triage nurse.   

## 2018-11-06 NOTE — Progress Notes (Signed)
Made Paula Royalty, RN aware that Cyclophosphamide needs to infuse over 45 min.  Pt c/o burning w/ cycle 1. Order already verified and drug mixing in our pharmacy robot.   Kennith Center, Pharm.D., CPP 11/06/2018@12 :18 PM

## 2018-11-06 NOTE — Assessment & Plan Note (Addendum)
08/21/2018:Bilateral mastectomies: Left mastectomy: IDC grade 3, 2.1 cm, high-grade DCIS, margins negative, negative for LV I, 3/9 lymph nodes positive, ER 95% positive, PR 90% positive, HER-2 -1+, Ki-67 30% T2N1A stage IIa right mastectomy: Benign MammaPrint: High risk luminal type B Echocardiogram 09/18/2018: EF 55 to 60% Patient has been enrolled and upbeat clinical trial: No adverse effects related to the trial  Treatment plan: 1.Adjuvant chemotherapy with dose dense Adriamycin and Cytoxan x4 followed by weekly Taxol x12 2.Followed by adjuvant antiestrogen therapy ------------------------------------------------------------------------------------------------------------------ Current Treatment: Cycle 4 day 1 Adriamycin and Cytoxan  Chemo Toxicities: 1.Nausea: Mild to moderate, controlled with anti emetics 2.Severe fatigue: reviewed brief periods of exercise 3. Left arm swelling: better 4.  Skin discoloration of hands and feet  Patient would like to go back to working during Taxol treatments.  I believe she can do that.   RTC in 2 weeks for cycle 1 Taxol

## 2018-11-08 ENCOUNTER — Inpatient Hospital Stay: Payer: BLUE CROSS/BLUE SHIELD

## 2018-11-08 VITALS — BP 127/71 | HR 104 | Temp 98.7°F | Resp 18

## 2018-11-08 DIAGNOSIS — Z79899 Other long term (current) drug therapy: Secondary | ICD-10-CM | POA: Diagnosis not present

## 2018-11-08 DIAGNOSIS — Z5189 Encounter for other specified aftercare: Secondary | ICD-10-CM | POA: Diagnosis not present

## 2018-11-08 DIAGNOSIS — Z170 Estrogen receptor positive status [ER+]: Principal | ICD-10-CM

## 2018-11-08 DIAGNOSIS — Z87891 Personal history of nicotine dependence: Secondary | ICD-10-CM | POA: Diagnosis not present

## 2018-11-08 DIAGNOSIS — Z5111 Encounter for antineoplastic chemotherapy: Secondary | ICD-10-CM | POA: Diagnosis not present

## 2018-11-08 DIAGNOSIS — Z803 Family history of malignant neoplasm of breast: Secondary | ICD-10-CM | POA: Diagnosis not present

## 2018-11-08 DIAGNOSIS — Z9011 Acquired absence of right breast and nipple: Secondary | ICD-10-CM | POA: Diagnosis not present

## 2018-11-08 DIAGNOSIS — C50412 Malignant neoplasm of upper-outer quadrant of left female breast: Secondary | ICD-10-CM

## 2018-11-08 DIAGNOSIS — Z9013 Acquired absence of bilateral breasts and nipples: Secondary | ICD-10-CM | POA: Diagnosis not present

## 2018-11-08 DIAGNOSIS — C50912 Malignant neoplasm of unspecified site of left female breast: Secondary | ICD-10-CM | POA: Diagnosis not present

## 2018-11-08 MED ORDER — PEGFILGRASTIM-CBQV 6 MG/0.6ML ~~LOC~~ SOSY
PREFILLED_SYRINGE | SUBCUTANEOUS | Status: AC
  Filled 2018-11-08: qty 0.6

## 2018-11-08 MED ORDER — PEGFILGRASTIM-CBQV 6 MG/0.6ML ~~LOC~~ SOSY
6.0000 mg | PREFILLED_SYRINGE | Freq: Once | SUBCUTANEOUS | Status: AC
  Administered 2018-11-08: 6 mg via SUBCUTANEOUS

## 2018-11-12 ENCOUNTER — Telehealth: Payer: Self-pay

## 2018-11-12 MED ORDER — PROMETHAZINE HCL 25 MG PO TABS: 25.0000 mg | ORAL_TABLET | Freq: Four times a day (QID) | ORAL | 0 refills | Status: DC | PRN

## 2018-11-12 NOTE — Telephone Encounter (Signed)
Pt continues with severe nausea after completing cycle 4 of A/C.  Patient denies emesis but has had the constant nausea since last treatment.  Pt has used Compazine and Zofran as recommended.  Pt tolerating fluids well.  No fever.  Per Dr. Lindi Adie recommendations Phenergan 25mg  PO Q 6 H as needed.    Pt aware, voiced understanding and agreement.  No further needs.

## 2018-11-15 NOTE — Progress Notes (Signed)
Patient Care Team: [name], DO as PCP - General (Family Medicine) Hurley Cisco, MD as Consulting Physician (Rheumatology) [name], MD as Consulting Physician (Orthopedic Surgery) Erroll Luna, MD as Consulting Physician (General Surgery) Nicholas Lose, MD as Consulting Physician (Hematology and Oncology) Kyung Rudd, MD as Consulting Physician (Radiation Oncology) Maxwell Marion, RN as Registered Nurse (Medical Oncology)  DIAGNOSIS:    ICD-10-CM   1. Malignant neoplasm of upper-outer quadrant of left breast in female, estrogen receptor positive ([place]) C50.412    Z17.0     SUMMARY OF ONCOLOGIC HISTORY:   Malignant neoplasm of upper-outer quadrant of left breast in female, estrogen receptor positive ([place])   07/13/2018 Initial Diagnosis     Palpable left breast mass, 2 adjacent masses at 2 o'clock position 2 cm biopsy IDC grade 2-3, Ig DCIS, ER 95%, PR 90%, Ki-67 30%, HER-2 -1+; 2:30 position intramammary lymph node 1.3 cm biopsy positive; left axillary lymph node biopsy negative discordant, T2N1, stage IIa    07/25/2018 Cancer Staging    Staging form: Breast, AJCC 8th Edition - Clinical: Stage IIA (cT1c, cN1, cM0, G3, ER+, PR+, HER2-) - Signed by Nicholas Lose, MD on 07/25/2018    08/21/2018 Surgery    Bilateral mastectomies: Left mastectomy: IDC grade 3, 2.1 cm, high-grade DCIS, margins negative, negative for LV I, 3/9 lymph nodes positive, ER 95% positive, PR 90% positive, HER-2 -1+, Ki-67 30% T2N1A stage IIa right mastectomy: Benign    09/03/2018 Cancer Staging    Staging form: Breast, AJCC 8th Edition - Pathologic: Stage IIA (pT2, pN1a, cM0, G3, ER+, PR+, HER2-) - Signed by Nicholas Lose, MD on 09/03/2018    09/03/2018 Miscellaneous    MammaPrint: High risk luminal type B, average 10-year risk of recurrence untreated 29%, predicted 5-year benefit of chemotherapy 94.6% distant metastasis free interval    09/26/2018 -  Chemotherapy    Adjuvant chemotherapy  with dose dense Adriamycin and Cytoxan x4 followed by weekly Taxol x12     CHIEF COMPLIANT: Cycle 1 of Taxol  INTERVAL HISTORY: Paula Jacobs is a 58 y.o. with above-mentioned history of left breast cancerwhounderwent bilateral mastectomiesand completed 4 cycles of dose dense Adriamycin and Cytoxan.She presents to the clinic todayfor Cycle 1 of Taxol. She presented to the ED on Sunday for diarrhea, nausea, and vomiting and reports nausea and 3 episodes of diarrhea this morning. She received a potassium supplement on Sunday and has been taking it daily. Her labs from today show: WBC 14.0, Hg 10.0, platelets 352.   REVIEW OF SYSTEMS:   Constitutional: Denies fevers, chills or abnormal weight loss Eyes: Denies blurriness of vision Ears, nose, mouth, throat, and face: Denies mucositis or sore throat Respiratory: Denies cough, dyspnea or wheezes Cardiovascular: Denies palpitation, chest discomfort Gastrointestinal: Denies heartburn (+) diarrhea (+) nausea (+) vomiting Skin: Denies abnormal skin rashes Lymphatics: Denies new lymphadenopathy or easy bruising Neurological: Denies numbness, tingling or new weaknesses Behavioral/Psych: Mood is stable, no new changes  Extremities: No lower extremity edema Breast: denies any pain or lumps or nodules in either breasts All other systems were reviewed with the patient and are negative.  I have reviewed the past medical history, past surgical history, social history and family history with the patient and they are unchanged from previous note.  ALLERGIES:  is allergic to compazine [prochlorperazine edisylate]; hydrocodone-acetaminophen; latex; and tramadol hcl.  MEDICATIONS:  Current Outpatient Medications  Medication Sig Dispense Refill  . acetaminophen (TYLENOL) 500 MG tablet Take 1,000 mg by mouth  every 6 (six) hours as needed for moderate pain.    . benazepril-hydrochlorthiazide (LOTENSIN HCT) 10-12.5 MG tablet TAKE 1 TABLET BY MOUTH  DAILY 90 tablet 0  . diphenhydrAMINE (BENADRYL) 25 MG tablet Take 25-50 mg by mouth every 6 (six) hours as needed for allergies.    Marland Kitchen ibuprofen (ADVIL,MOTRIN) 800 MG tablet Take 1 tablet (800 mg total) by mouth every 8 (eight) hours as needed. 30 tablet 0  . levothyroxine (SYNTHROID, LEVOTHROID) 175 MCG tablet TAKE 1 TABLET BY MOUTH EVERY MORNING (Patient taking differently: Take 175 mcg by mouth daily before breakfast. ) 30 tablet 5  . lidocaine-prilocaine (EMLA) cream Apply to affected area once 30 g 3  . LORazepam (ATIVAN) 1 MG tablet Take 1 tablet (1 mg total) by mouth at bedtime as needed for sleep. 30 tablet 3  . ondansetron (ZOFRAN) 8 MG tablet Take 1 tablet (8 mg total) by mouth 2 (two) times daily as needed. Start on the third day after chemotherapy. (Patient not taking: Reported on 10/23/2018) 30 tablet 1  . prochlorperazine (COMPAZINE) 10 MG tablet Take 1 tablet (10 mg total) by mouth every 6 (six) hours as needed (Nausea or vomiting). (Patient not taking: Reported on 11/18/2018) 30 tablet 1  . promethazine (PHENERGAN) 25 MG tablet Take 1 tablet (25 mg total) by mouth every 6 (six) hours as needed for up to 3 days for nausea or vomiting. 12 tablet 0   No current facility-administered medications for this visit.    Facility-Administered Medications Ordered in Other Visits  Medication Dose Route Frequency Provider Last Rate Last Dose  . heparin lock flush 100 unit/mL  500 Units Intracatheter Once PRN Nicholas Lose, MD      . sodium chloride flush (NS) 0.9 % injection 10 mL  10 mL Intracatheter PRN Nicholas Lose, MD      . sodium chloride flush (NS) 0.9 % injection 10 mL  10 mL Intracatheter PRN Nicholas Lose, MD   10 mL at 11/20/18 0847    PHYSICAL EXAMINATION: ECOG PERFORMANCE STATUS: 1 - Symptomatic but completely ambulatory  Vitals:   11/20/18 0905  BP: 112/77  Pulse: 74  Resp: 18  Temp: 98.2 F (36.8 C)  SpO2: 99%   Filed Weights   11/20/18 0905  Weight: 188 lb 13.9 oz (85.7  kg)    GENERAL: alert, no distress and comfortable SKIN: skin color, texture, turgor are normal, no rashes or significant lesions EYES: normal, Conjunctiva are pink and non-injected, sclera clear OROPHARYNX: no exudate, no erythema and lips, buccal mucosa, and tongue normal  NECK: supple, thyroid normal size, non-tender, without nodularity LYMPH: no palpable lymphadenopathy in the cervical, axillary or inguinal LUNGS: clear to auscultation and percussion with normal breathing effort HEART: regular rate & rhythm and no murmurs and no lower extremity edema ABDOMEN: abdomen soft, non-tender and normal bowel sounds MUSCULOSKELETAL: no cyanosis of digits and no clubbing  NEURO: alert & oriented x 3 with fluent speech, no focal motor/sensory deficits EXTREMITIES: No lower extremity edema  LABORATORY DATA:  I have reviewed the data as listed CMP Latest Ref Rng & Units 11/18/2018 11/06/2018 10/23/2018  Glucose 70 - 99 mg/dL 126(H) 98 124(H)  BUN 6 - 20 mg/dL '7 7 7  '$ Creatinine 0.44 - 1.00 mg/dL 0.58 0.66 0.73  Sodium 135 - 145 mmol/L 140 138 141  Potassium 3.5 - 5.1 mmol/L 3.2(L) 3.4(L) 3.9  Chloride 98 - 111 mmol/L 107 102 106  CO2 22 - 32 mmol/L 25 26 26  Calcium 8.9 - 10.3 mg/dL 8.7(L) 8.7(L) 9.0  Total Protein 6.5 - 8.1 g/dL 6.7 6.7 6.9  Total Bilirubin 0.3 - 1.2 mg/dL 0.3 0.2(L) 0.3  Alkaline Phos 38 - 126 U/L 67 68 65  AST 15 - 41 U/L 16 18 14(L)  ALT 0 - 44 U/L '12 10 11    '$ Lab Results  Component Value Date   WBC 14.0 (H) 11/20/2018   HGB 10.0 (L) 11/20/2018   HCT 32.0 (L) 11/20/2018   MCV 87.7 11/20/2018   PLT 352 11/20/2018   NEUTROABS PENDING 11/20/2018    ASSESSMENT & PLAN:  Malignant neoplasm of upper-outer quadrant of left breast in female, estrogen receptor positive ([place]) 08/21/2018:Bilateral mastectomies: Left mastectomy: IDC grade 3, 2.1 cm, high-grade DCIS, margins negative, negative for LV I, 3/9 lymph nodes positive, ER 95% positive, PR 90% positive, HER-2 -1+, Ki-67  30% T2N1A stage IIa right mastectomy: Benign MammaPrint: High risk luminal type B Echocardiogram 09/18/2018: EF 55 to 60% Patient has been enrolled and upbeat clinical trial: No adverse effects related to the trial  Treatment plan: 1.Adjuvant chemotherapy with dose dense Adriamycin and Cytoxan x4 followed by weekly Taxol x12 2.Followed by adjuvant antiestrogen therapy ------------------------------------------------------------------------------------------------------------------ Current Treatment:Completed 4 cycles of dose dense Adriamycin and Cytoxan, today is cycle 1 Taxol Chemo Toxicities: 1.Nausea: Mild to moderate, controlled with anti emetics 2.Severe fatigue:reviewed brief periods of exercise 3. Left arm swelling: better 4.  Skin discoloration of hands and feet 5.  Diarrhea with nausea and vomiting leading to ER visit on 11/18/2018: Was given potassium pills for hypokalemia. Diarrhea is slowly getting better.  I instructed her to take Imodium. We will give Aloxi with each of her Taxol treatments because of her propensity for nausea.  Patient would like to go back to working during Taxol treatments.   RTC weekly for Taxol.  I will see her next week for toxicity evaluation.  No orders of the defined types were placed in this encounter.  The patient has a good understanding of the overall plan. she agrees with it. she will call with any problems that may develop before the next visit here.  Nicholas Lose, MD 11/20/2018  Julious Oka Dorshimer am acting as scribe for Dr. Nicholas Lose.  I have reviewed the above documentation for accuracy and completeness, and I agree with the above.

## 2018-11-18 ENCOUNTER — Other Ambulatory Visit: Payer: Self-pay

## 2018-11-18 ENCOUNTER — Encounter (HOSPITAL_COMMUNITY): Payer: Self-pay

## 2018-11-18 ENCOUNTER — Emergency Department (HOSPITAL_COMMUNITY)
Admission: EM | Admit: 2018-11-18 | Discharge: 2018-11-18 | Disposition: A | Discharge status: Home / Self Care | Payer: BLUE CROSS/BLUE SHIELD | Attending: Emergency Medicine | Admitting: Emergency Medicine

## 2018-11-18 DIAGNOSIS — I10 Essential (primary) hypertension: Secondary | ICD-10-CM | POA: Insufficient documentation

## 2018-11-18 DIAGNOSIS — R112 Nausea with vomiting, unspecified: Secondary | ICD-10-CM | POA: Diagnosis not present

## 2018-11-18 DIAGNOSIS — E039 Hypothyroidism, unspecified: Secondary | ICD-10-CM | POA: Insufficient documentation

## 2018-11-18 DIAGNOSIS — Z87891 Personal history of nicotine dependence: Secondary | ICD-10-CM | POA: Insufficient documentation

## 2018-11-18 DIAGNOSIS — Z853 Personal history of malignant neoplasm of breast: Secondary | ICD-10-CM | POA: Diagnosis not present

## 2018-11-18 DIAGNOSIS — R197 Diarrhea, unspecified: Secondary | ICD-10-CM | POA: Diagnosis not present

## 2018-11-18 DIAGNOSIS — Z79899 Other long term (current) drug therapy: Secondary | ICD-10-CM | POA: Insufficient documentation

## 2018-11-18 DIAGNOSIS — Z96653 Presence of artificial knee joint, bilateral: Secondary | ICD-10-CM | POA: Diagnosis not present

## 2018-11-18 DIAGNOSIS — Z91040 Latex allergy status: Secondary | ICD-10-CM | POA: Diagnosis not present

## 2018-11-18 LAB — CBC WITH DIFFERENTIAL/PLATELET
ABS IMMATURE GRANULOCYTES: 2.29 10*3/uL — AB (ref 0.00–0.07)
Basophils Absolute: 0.1 10*3/uL (ref 0.0–0.1)
Basophils Relative: 1 %
EOS PCT: 1 %
Eosinophils Absolute: 0.1 10*3/uL (ref 0.0–0.5)
HCT: 30.9 % — ABNORMAL LOW (ref 36.0–46.0)
Hemoglobin: 9.4 g/dL — ABNORMAL LOW (ref 12.0–15.0)
Immature Granulocytes: 29 %
Lymphocytes Relative: 8 %
Lymphs Abs: 0.6 10*3/uL — ABNORMAL LOW (ref 0.7–4.0)
MCH: 27.1 pg (ref 26.0–34.0)
MCHC: 30.4 g/dL (ref 30.0–36.0)
MCV: 89 fL (ref 80.0–100.0)
Monocytes Absolute: 1.3 10*3/uL — ABNORMAL HIGH (ref 0.1–1.0)
Monocytes Relative: 16 %
NRBC: 0.5 % — AB (ref 0.0–0.2)
Neutro Abs: 3.6 10*3/uL (ref 1.7–7.7)
Neutrophils Relative %: 45 %
Platelets: 329 10*3/uL (ref 150–400)
RBC: 3.47 MIL/uL — ABNORMAL LOW (ref 3.87–5.11)
RDW: 17 % — ABNORMAL HIGH (ref 11.5–15.5)
WBC: 8 10*3/uL (ref 4.0–10.5)

## 2018-11-18 LAB — COMPREHENSIVE METABOLIC PANEL
ALT: 12 U/L (ref 0–44)
AST: 16 U/L (ref 15–41)
Albumin: 3.9 g/dL (ref 3.5–5.0)
Alkaline Phosphatase: 67 U/L (ref 38–126)
Anion gap: 8 (ref 5–15)
BUN: 7 mg/dL (ref 6–20)
CO2: 25 mmol/L (ref 22–32)
Calcium: 8.7 mg/dL — ABNORMAL LOW (ref 8.9–10.3)
Chloride: 107 mmol/L (ref 98–111)
Creatinine, Ser: 0.58 mg/dL (ref 0.44–1.00)
GFR calc Af Amer: >60 mL/min (ref >60)
GFR calc non Af Amer: >60 mL/min (ref >60)
Glucose, Bld: 126 mg/dL — ABNORMAL HIGH (ref 70–99)
POTASSIUM: 3.2 mmol/L — AB (ref 3.5–5.1)
Sodium: 140 mmol/L (ref 135–145)
Total Bilirubin: 0.3 mg/dL (ref 0.3–1.2)
Total Protein: 6.7 g/dL (ref 6.5–8.1)

## 2018-11-18 LAB — URINALYSIS, ROUTINE W REFLEX MICROSCOPIC
BILIRUBIN URINE: NEGATIVE
Glucose, UA: NEGATIVE mg/dL
Hgb urine dipstick: NEGATIVE
Ketones, ur: NEGATIVE mg/dL
Leukocytes,Ua: NEGATIVE
NITRITE: NEGATIVE
Protein, ur: NEGATIVE mg/dL
SPECIFIC GRAVITY, URINE: 1.011 (ref 1.005–1.030)
pH: 7 (ref 5.0–8.0)

## 2018-11-18 LAB — LIPASE, BLOOD: Lipase: 23 U/L (ref 11–51)

## 2018-11-18 MED ORDER — LORAZEPAM 1 MG PO TABS
1.0000 mg | ORAL_TABLET | Freq: Once | ORAL | Status: AC
  Administered 2018-11-18: 1 mg via ORAL
  Filled 2018-11-18: qty 1

## 2018-11-18 MED ORDER — SODIUM CHLORIDE 0.9 % IV BOLUS: 500.0000 mL | Freq: Once | INTRAVENOUS | Status: DC

## 2018-11-18 MED ORDER — POTASSIUM CHLORIDE CRYS ER 20 MEQ PO TBCR
40.0000 meq | EXTENDED_RELEASE_TABLET | Freq: Once | ORAL | Status: AC
  Administered 2018-11-18: 40 meq via ORAL
  Filled 2018-11-18: qty 2

## 2018-11-18 MED ORDER — DIPHENHYDRAMINE HCL 50 MG/ML IJ SOLN
25.0000 mg | Freq: Once | INTRAMUSCULAR | Status: AC
  Administered 2018-11-18: 25 mg via INTRAVENOUS
  Filled 2018-11-18: qty 1

## 2018-11-18 MED ORDER — HEPARIN SOD (PORK) LOCK FLUSH 100 UNIT/ML IV SOLN
500.0000 [IU] | Freq: Once | INTRAVENOUS | Status: AC | PRN
  Administered 2018-11-18: 500 [IU]
  Filled 2018-11-18: qty 5

## 2018-11-18 MED ORDER — PROCHLORPERAZINE EDISYLATE 10 MG/2ML IJ SOLN
10.0000 mg | Freq: Once | INTRAMUSCULAR | Status: AC
  Administered 2018-11-18: 10 mg via INTRAVENOUS
  Filled 2018-11-18: qty 2

## 2018-11-18 MED ORDER — PROMETHAZINE HCL 25 MG PO TABS: 25.0000 mg | ORAL_TABLET | Freq: Four times a day (QID) | ORAL | 0 refills | Status: DC | PRN

## 2018-11-18 NOTE — ED Notes (Signed)
Patient has used restroom four times and has not been able to provide stool sample.

## 2018-11-18 NOTE — ED Provider Notes (Addendum)
[facility] DEPT Provider Note   CSN: [id number] Arrival date & time: 11/18/18  1055    History   Chief Complaint Chief Complaint  Patient presents with  . Nausea  . Diarrhea    HPI Paula Jacobs is a 58 y.o. female.     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  Patient was her symptoms began around 2 AM.  She reports that she has vomited approximately 3 times, nonbilious nonbloody, and had approximately 3 episodes of watery diarrhea without melena or hematochezia.  Patient reports that her stomach feels "unsettled" but denies any abdominal pain.  Denies fever or chills.  Patient denies any chest pain, shortness of breath, flank pain, dysuria, urgency, or frequency.  Patient reports that she had increased nausea after her last chemotherapy session, however it was improving towards the end of last week.  She reports that she received Phenergan from her oncology team and that improved her symptoms.  Patient reports that she has had no known sick contacts.  She does report that she ate out at 2 different restaurants yesterday.  No other persons with her had similar symptoms.  Past Medical History:  Diagnosis Date  . ANEMIA-NOS 04/23/2008  . Aneurysm ([place]) 10/19/2017   Evaluated by [facility] 10/2017, 15m, stable  . Anginal pain ([facility]    went to ED in april 2018 c/o chest pain over last 2 months ; had EKG, CXR  and troponin negative per physician suspected musculoskeletal  ; dc'd with ibuprofen  and recc f/u with outpatient stress test; see care everywhere ED visit  ; patient denies recurrence of Chest pain since, endorses occ palpitations   . DEPRESSION 04/23/2008  . DJD (degenerative joint disease)   . Family history of breast cancer   . Hyperplastic colon polyp    x2  . Hypertension   .  HYPOTHYROIDISM 10/17/2007  . Osteoarthritis   . Palpitations    freq at night;  at pre-op states she drinks caffeine and that makes it worse; says the palpitations started after they took her thyroid   . PAT 10/27/2009   Qualifier: Diagnosis of  By: MAundra Dubin MD, [name]    . Pneumonia    "in the past, havent had it in years"  . PONV (postoperative nausea and vomiting)    only after 1979 surgery  . RA (rheumatoid arthritis) ([facility]    "problems in feet, hands, and knees"  . 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 SVT (supraventricular tachycardia) ([facility])    chronic   . Tubulovillous adenoma of colon     Patient Active Problem List   Diagnosis Date Noted  . Port-A-Cath in place 09/26/2018  . Breast cancer, left ([facility] 08/22/2018  . Breast cancer, stage 2, left ([facility] 08/21/2018  . Genetic testing 08/08/2018  . Family history of breast cancer   . Malignant neoplasm of upper-outer quadrant of left breast in female, estrogen receptor positive ([facility] [place] 07/19/2018  . 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 Aneurysm ([facility] 10/19/2017  . Morbid obesity ([facility] 10/22/2014  . 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 Rheumatoid arthritis ([facility] 09/26/2013  . Hypertension 01/31/2011  . Hypothyroidism 10/17/2007    Past Surgical History:  Procedure Laterality Date  . ABDOMINAL HYSTERECTOMY    . APPENDECTOMY    . BUNIONECTOMY  10/2009 right &  02/03/10 left  . COLONOSCOPY W/ POLYPECTOMY  2015  . HAMMER TOE SURGERY     "all toes have pins, done with bunionectomy"  . INGUINAL HERNIA REPAIR    . KNEE CLOSED REDUCTION Left 12/05/2017   Procedure: CLOSED MANIPULATION LEFT KNEE;  Surgeon: [name], MD;  Location: [facility];  Service: Orthopedics;  Laterality: Left;  Marland Kitchen MASTECTOMY WITH RADIOACTIVE SEED GUIDED EXCISION AND AXILLARY SENTINEL LYMPH NODE BIOPSY Bilateral 08/21/2018   Procedure: BILATERAL SIMPLE MASTECTOMIES WITH LEFT AXILLARY RADIOACTIVE SEED GUIDED LYMPH NODE EXCISION AND LEFT AXILLARY SENTINEL LYMPH NODE  BIOPSY;  Surgeon: Erroll Luna, MD;  Location: [facility];  Service: General;  Laterality: Bilateral;  . neck fusion     x3  . PORTACATH PLACEMENT Right 09/25/2018   Procedure: INSERTION PORT-A-CATH WITH ULTRASOUND ERAS PATHWAY;  Surgeon: Erroll Luna, MD;  Location: [facility];  Service: General;  Laterality: Right;  . THYROIDECTOMY  2001   for nodules  . TOE AMPUTATION  1979   6th toe removed from each foot  . TOTAL KNEE ARTHROPLASTY Left 10/21/2014   Procedure: LEFT TOTAL KNEE ARTHROPLASTY;  Surgeon: [name], MD;  Location: [facility];  Service: Orthopedics;  Laterality: Left;  . TOTAL KNEE ARTHROPLASTY Right 12/05/2017   Procedure: RIGHT TOTAL KNEE ARTHROPLASTY;  Surgeon: [name], MD;  Location: [facility];  Service: Orthopedics;  Laterality: Right;  70 mins     OB History   No obstetric history on file.      Home Medications    Prior to Admission medications   Medication Sig Start Date End Date Taking? Authorizing Provider  acetaminophen (TYLENOL) 500 MG tablet Take 1,000 mg by mouth every 6 (six) hours as needed for moderate pain.   Yes [provider]  benazepril-hydrochlorthiazide (LOTENSIN HCT) 10-12.5 MG tablet TAKE 1 TABLET BY MOUTH DAILY 06/26/18  Yes Colin Benton R, DO  diphenhydrAMINE (BENADRYL) 25 MG tablet Take 25-50 mg by mouth every 6 (six) hours as needed for allergies.   Yes [provider]  ibuprofen (ADVIL,MOTRIN) 800 MG tablet Take 1 tablet (800 mg total) by mouth every 8 (eight) hours as needed. 08/23/18  Yes Cornett, Marcello Moores, MD  levothyroxine (SYNTHROID, LEVOTHROID) 175 MCG tablet TAKE 1 TABLET BY MOUTH EVERY MORNING Patient taking differently: Take 175 mcg by mouth daily before breakfast.  07/16/18  Yes [name], DO  lidocaine-prilocaine (EMLA) cream Apply to affected area once 09/06/18  Yes Nicholas Lose, MD  LORazepam (ATIVAN) 1 MG tablet Take 1 tablet (1 mg total) by mouth at bedtime as needed for sleep. 10/09/18  Yes  Nicholas Lose, MD  promethazine (PHENERGAN) 25 MG tablet Take 1 tablet (25 mg total) by mouth every 6 (six) hours as needed for nausea or vomiting. 11/12/18  Yes Nicholas Lose, MD  ondansetron (ZOFRAN) 8 MG tablet Take 1 tablet (8 mg total) by mouth 2 (two) times daily as needed. Start on the third day after chemotherapy. Patient not taking: Reported on 10/23/2018 09/06/18   Nicholas Lose, MD  prochlorperazine (COMPAZINE) 10 MG tablet Take 1 tablet (10 mg total) by mouth every 6 (six) hours as needed (Nausea or vomiting). Patient not taking: Reported on 11/18/2018 10/18/18   Nicholas Lose, MD    Family History Family History  Problem Relation Age of Onset  . Breast cancer Mother 57       metastatic to brain, died at 20  . Dementia Father        died at 70  .  Heart attack Maternal Grandmother   . Stroke Maternal Grandmother   . Diabetes Maternal Grandmother   . Heart disease Maternal Grandmother   . Thyroid disease Maternal Grandmother   . COPD Maternal Aunt   . Cancer Paternal Uncle        details unk  . Esophageal cancer Neg Hx   . Rectal cancer Neg Hx   . Stomach cancer Neg Hx     Social History Social History   Tobacco Use  . Smoking status: Former Smoker    Packs/day: 0.50    Years: 33.00    Pack years: 16.50    Last attempt to quit: 09/15/2011    Years since quitting: 7.1  . Smokeless tobacco: Never Used  Substance Use Topics  . Alcohol use: Yes    Comment: rare  . Drug use: No     Allergies   Hydrocodone-acetaminophen; Latex; and Tramadol hcl   Review of Systems Review of Systems  Constitutional: Negative for chills and fever.  HENT: Negative for congestion and sore throat.   Eyes: Negative for visual disturbance.  Respiratory: Negative for cough, chest tightness and shortness of breath.   Cardiovascular: Negative for chest pain and palpitations.  Gastrointestinal: Positive for diarrhea, nausea and vomiting. Negative for abdominal pain and constipation.    Genitourinary: Negative for difficulty urinating, dysuria, flank pain, frequency, pelvic pain and urgency.  Musculoskeletal: Negative for back pain and myalgias.  Skin: Negative for rash.  Neurological: Negative for dizziness, syncope, light-headedness and headaches.     Physical Exam Updated Vital Signs BP 134/88   Pulse 90   Temp 98.3 F (36.8 C) (Oral)   Resp 19   Ht '5\' 2"'$  (1.575 m)   Wt 88 kg   SpO2 95%   BMI 35.48 kg/m   Physical Exam Vitals signs and nursing note reviewed.  Constitutional:      General: She is not in acute distress.    Appearance: She is well-developed. She is not ill-appearing or diaphoretic.  HENT:     Head: Normocephalic and atraumatic.     Mouth/Throat:     Mouth: Mucous membranes are moist.  Eyes:     Conjunctiva/sclera: Conjunctivae normal.     Pupils: Pupils are equal, round, and reactive to light.  Neck:     Musculoskeletal: Normal range of motion and neck supple.  Cardiovascular:     Rate and Rhythm: Normal rate and regular rhythm.     Heart sounds: S1 normal and S2 normal. No murmur.  Pulmonary:     Effort: Pulmonary effort is normal.     Breath sounds: Normal breath sounds. No wheezing or rales.  Abdominal:     General: Bowel sounds are normal. There is no distension.     Palpations: Abdomen is soft.     Tenderness: There is no abdominal tenderness. There is no guarding or rebound.  Musculoskeletal: Normal range of motion.        General: No deformity.  Lymphadenopathy:     Cervical: No cervical adenopathy.  Skin:    General: Skin is warm and dry.     Findings: No erythema or rash.  Neurological:     Mental Status: She is alert.     Comments: Cranial nerves grossly intact. Patient moves extremities symmetrically and with good coordination.  Psychiatric:        Behavior: Behavior normal.        Thought Content: Thought content normal.        Judgment:  Judgment normal.      ED Treatments / Results  Labs (all labs  ordered are listed, but only abnormal results are displayed) Labs Reviewed  COMPREHENSIVE METABOLIC PANEL - Abnormal; Notable for the following components:      Result Value   Potassium 3.2 (*)    Glucose, Bld 126 (*)    Calcium 8.7 (*)    All other components within normal limits  URINALYSIS, ROUTINE W REFLEX MICROSCOPIC - Abnormal; Notable for the following components:   Color, Urine STRAW (*)    All other components within normal limits  CBC WITH DIFFERENTIAL/PLATELET - Abnormal; Notable for the following components:   RBC 3.47 (*)    Hemoglobin 9.4 (*)    HCT 30.9 (*)    RDW 17.0 (*)    nRBC 0.5 (*)    Lymphs Abs 0.6 (*)    Monocytes Absolute 1.3 (*)    Abs Immature Granulocytes 2.29 (*)    All other components within normal limits  GASTROINTESTINAL PANEL BY PCR, STOOL (REPLACES STOOL CULTURE)  C DIFFICILE QUICK SCREEN W PCR REFLEX  LIPASE, BLOOD    EKG None  Radiology No results found.  Procedures Procedures (including critical care time)  Medications Ordered in ED Medications  heparin lock flush 100 unit/mL (has no administration in time range)  potassium chloride SA (K-DUR,KLOR-CON) CR tablet 40 mEq (40 mEq Oral Given 11/18/18 1335)  diphenhydrAMINE (BENADRYL) injection 25 mg (25 mg Intravenous Given 11/18/18 1336)  prochlorperazine (COMPAZINE) injection 10 mg (10 mg Intravenous Given 11/18/18 1336)  LORazepam (ATIVAN) tablet 1 mg (1 mg Oral Given 11/18/18 1450)     Initial Impression / Assessment and Plan / ED Course  I have reviewed the triage vital signs and the nursing notes.  Pertinent labs & imaging results that were available during my care of the patient were reviewed by me and considered in my medical decision making (see chart for details).  Clinical Course as of Nov 17 1556  Sun Nov 18, 2018  1317 Creatinine: 0.58 [AM]  1319 Hemoglobin stable from 12 days ago.  Hemoglobin(!): 9.4 [AM]  1443 Patient reports that her nausea is improving.  She reports  that she has been able to tolerate a potassium pill and a glass of water.  She reports that the Compazine has made her jittery.  Will try oral Ativan to reduce side effects as well as improve nausea.   [AM]  1458 No neutropenia.   Neutrophils: 45 [AM]  1536 Patient verbally verified a safe ride from the ED. Proceeded with prescribing ativan for pain/relaxtion/muscle relaxation in the ED.   [AM]    Clinical Course User Index [AM] Albesa Seen, PA-C       Patient nontoxic-appearing, hemodynamically stable, and has a nonsurgical nontender abdomen.  Do not suspect acute intra-abdominal process.  Initial diagnosis includes viral gastroenteritis, infectious diarrhea, chemotherapy related nausea and vomiting, urinary tract infection.  Patient is not experiencing any chest pain, shortness of breath.  Symptoms more consistent with gastrointestinal process.  No suspect ACS.  Work-up demonstrating potassium of 3.2, repleted.  Normal renal function.  No evidence of AKI.  No evidence of urinary tract infection.  Patient has stable anemia with a hemoglobin of 9.4, stable from 12 days ago.  Patient appears immunocompetent with normal neutrophil count.  No leukocytosis.  Patient given 2 doses of antiemetics and is improving.  She reports that her oral Phenergan at home has helped.  No vomiting noted in emergency department.  No further episodes of diarrhea.  Will have patient take home kit to collect stool sample to take to oncology if diarrhea continues.   Patient ported resolution of nausea.  Will prescribe additional Phenergan to go home with.  Return precautions given for any intractable nausea or vomiting, or abdominal pain with nausea or vomiting.  Patient is in understanding and agrees with the plan of care.  This is a supervised visit with Dr. Isla Pence. Evaluation, management, and discharge planning discussed with this attending physician.  Final Clinical Impressions(s) / ED Diagnoses    Final diagnoses:  Non-intractable vomiting with nausea, unspecified vomiting type  Diarrhea, unspecified type    ED Discharge Orders         Ordered    promethazine (PHENERGAN) 25 MG tablet  Every 6 hours PRN     11/18/18 1542           Albesa Seen, PA-C 11/18/18 1559    Isla Pence, MD 11/18/18 1607

## 2018-11-18 NOTE — ED Triage Notes (Signed)
Patient is AOx4 and ambulatory. Patient comes from home. Patient is currently receiving chemo for Stage 2 Breast Cancer, last dose of first round was about 2 weeks ago. Patient has bad Nausea, vomiting and diarrhea that began around 0200 this morning. Patient has had about 4 episodes of diarrhea, nausea and 4 episodes of vomiting. Patient is not complaining of any abdominal pain or pain anywhere.

## 2018-11-18 NOTE — Discharge Instructions (Signed)
Please read and follow all provided instructions.  Your diagnoses today include:  1. Non-intractable vomiting with nausea, unspecified vomiting type   2. Diarrhea, unspecified type     Tests performed today include: Blood counts and electrolytes Blood tests to check liver and kidney function Blood tests to check pancreas function Urine test to look for infection and pregnancy (in women) Vital signs. See below for your results today.   We are unable to test her stool today.  If the diarrhea returns, please use the materials we gave you for obtaining a sample to bring to your oncologist.   Medications prescribed:   Take any prescribed medications only as directed.  Please take Phenergan every 6 hours as needed for nausea and vomiting.  Home care instructions:  Follow any educational materials contained in this packet.  Your abdominal pain, nausea, vomiting, and diarrhea may be caused by a viral gastroenteritis also called 'stomach flu'. You should rest for the next several days. Keep drinking plenty of fluids and use the medicine for nausea as directed.   Drink clear liquids for the next 24 hours and introduce solid foods slowly after 24 hours using the b.r.a.t. diet (Bananas, Rice, Applesauce, Toast, Yogurt).    Follow-up instructions: Please follow-up with your primary care provider in the next 2 days for further evaluation of your symptoms. If you are not feeling better in 48 hours you may have a condition that is more serious and you need re-evaluation.   Return instructions:  SEEK IMMEDIATE MEDICAL ATTENTION IF: If you have pain that does not go away or becomes severe  A temperature above 101F develops  Repeated vomiting occurs (multiple episodes)  If you have pain that becomes localized to portions of the abdomen. The right side could possibly be appendicitis. In an adult, the left lower portion of the abdomen could be colitis or diverticulitis.  Blood is being passed in  stools or vomit (bright red or black tarry stools)  You develop chest pain, difficulty breathing, dizziness or fainting, or become confused, poorly responsive, or inconsolable (young children) If you have any other emergent concerns regarding your health  Additional Information: Abdominal (belly) pain can be caused by many things. Your caregiver performed an examination and possibly ordered blood/urine tests and imaging (CT scan, x-rays, ultrasound). Many cases can be observed and treated at home after initial evaluation in the emergency department. Even though you are being discharged home, abdominal pain can be unpredictable. Therefore, you need a repeated exam if your pain does not resolve, returns, or worsens. Most patients with abdominal pain don't have to be admitted to the hospital or have surgery, but serious problems like appendicitis and gallbladder attacks can start out as nonspecific pain. Many abdominal conditions cannot be diagnosed in one visit, so follow-up evaluations are very important.  Your vital signs today were: BP 138/85    Pulse 86    Temp 98.3 F (36.8 C) (Oral)    Resp 16    Ht 5\' 2"  (1.575 m)    Wt 88 kg    SpO2 99%    BMI 35.48 kg/m  If your blood pressure (bp) was elevated above 135/85 this visit, please have this repeated by your doctor within one month. --------------

## 2018-11-20 ENCOUNTER — Encounter: Payer: Self-pay | Admitting: *Deleted

## 2018-11-20 ENCOUNTER — Inpatient Hospital Stay: Payer: BLUE CROSS/BLUE SHIELD

## 2018-11-20 ENCOUNTER — Inpatient Hospital Stay (HOSPITAL_BASED_OUTPATIENT_CLINIC_OR_DEPARTMENT_OTHER): Payer: BLUE CROSS/BLUE SHIELD | Admitting: Hematology and Oncology

## 2018-11-20 ENCOUNTER — Inpatient Hospital Stay: Payer: BLUE CROSS/BLUE SHIELD | Attending: Hematology and Oncology

## 2018-11-20 VITALS — BP 135/90 | HR 85 | Temp 98.4°F | Resp 16

## 2018-11-20 DIAGNOSIS — Z170 Estrogen receptor positive status [ER+]: Principal | ICD-10-CM

## 2018-11-20 DIAGNOSIS — R630 Anorexia: Secondary | ICD-10-CM | POA: Diagnosis not present

## 2018-11-20 DIAGNOSIS — K2970 Gastritis, unspecified, without bleeding: Secondary | ICD-10-CM | POA: Insufficient documentation

## 2018-11-20 DIAGNOSIS — Z79899 Other long term (current) drug therapy: Secondary | ICD-10-CM | POA: Insufficient documentation

## 2018-11-20 DIAGNOSIS — R5383 Other fatigue: Secondary | ICD-10-CM | POA: Diagnosis not present

## 2018-11-20 DIAGNOSIS — Z9013 Acquired absence of bilateral breasts and nipples: Secondary | ICD-10-CM

## 2018-11-20 DIAGNOSIS — C50412 Malignant neoplasm of upper-outer quadrant of left female breast: Secondary | ICD-10-CM | POA: Diagnosis not present

## 2018-11-20 DIAGNOSIS — R197 Diarrhea, unspecified: Secondary | ICD-10-CM | POA: Insufficient documentation

## 2018-11-20 DIAGNOSIS — M7989 Other specified soft tissue disorders: Secondary | ICD-10-CM

## 2018-11-20 DIAGNOSIS — Z5111 Encounter for antineoplastic chemotherapy: Secondary | ICD-10-CM | POA: Diagnosis not present

## 2018-11-20 DIAGNOSIS — R530 Neoplastic (malignant) related fatigue: Secondary | ICD-10-CM

## 2018-11-20 DIAGNOSIS — R110 Nausea: Secondary | ICD-10-CM | POA: Diagnosis not present

## 2018-11-20 DIAGNOSIS — Z95828 Presence of other vascular implants and grafts: Secondary | ICD-10-CM

## 2018-11-20 DIAGNOSIS — R238 Other skin changes: Secondary | ICD-10-CM

## 2018-11-20 LAB — CMP (CANCER CENTER ONLY)
ALT: 15 U/L (ref 0–44)
ANION GAP: 11 (ref 5–15)
AST: 21 U/L (ref 15–41)
Albumin: 4 g/dL (ref 3.5–5.0)
Alkaline Phosphatase: 72 U/L (ref 38–126)
BUN: 7 mg/dL (ref 6–20)
CO2: 24 mmol/L (ref 22–32)
Calcium: 9 mg/dL (ref 8.9–10.3)
Chloride: 106 mmol/L (ref 98–111)
Creatinine: 0.75 mg/dL (ref 0.44–1.00)
GFR, Est AFR Am: >60 mL/min (ref >60)
GFR, Estimated: >60 mL/min (ref >60)
GLUCOSE: 109 mg/dL — AB (ref 70–99)
Potassium: 3.8 mmol/L (ref 3.5–5.1)
Sodium: 141 mmol/L (ref 135–145)
Total Bilirubin: 0.3 mg/dL (ref 0.3–1.2)
Total Protein: 7 g/dL (ref 6.5–8.1)

## 2018-11-20 LAB — CBC WITH DIFFERENTIAL (CANCER CENTER ONLY)
Abs Immature Granulocytes: 1.3 10*3/uL — ABNORMAL HIGH (ref 0.00–0.07)
Band Neutrophils: 19 %
Basophils Absolute: 0.4 10*3/uL — ABNORMAL HIGH (ref 0.0–0.1)
Basophils Relative: 3 %
Blasts: 1 %
Eosinophils Absolute: 0.1 10*3/uL (ref 0.0–0.5)
Eosinophils Relative: 1 %
HCT: 32 % — ABNORMAL LOW (ref 36.0–46.0)
Hemoglobin: 10 g/dL — ABNORMAL LOW (ref 12.0–15.0)
Lymphocytes Relative: 13 %
Lymphs Abs: 1.8 10*3/uL (ref 0.7–4.0)
MCH: 27.4 pg (ref 26.0–34.0)
MCHC: 31.3 g/dL (ref 30.0–36.0)
MCV: 87.7 fL (ref 80.0–100.0)
METAMYELOCYTES PCT: 5 %
Monocytes Absolute: 1.4 10*3/uL — ABNORMAL HIGH (ref 0.1–1.0)
Monocytes Relative: 10 %
Myelocytes: 4 %
Neutro Abs: 8.8 10*3/uL (ref 1.7–17.7)
Neutrophils Relative %: 44 %
PLATELETS: 352 10*3/uL (ref 150–400)
RBC: 3.65 MIL/uL — ABNORMAL LOW (ref 3.87–5.11)
RDW: 17.8 % — ABNORMAL HIGH (ref 11.5–15.5)
WBC Count: 14 10*3/uL — ABNORMAL HIGH (ref 4.0–10.5)
nRBC: 0.4 % — ABNORMAL HIGH (ref 0.0–0.2)

## 2018-11-20 MED ORDER — PALONOSETRON HCL INJECTION 0.25 MG/5ML
0.2500 mg | Freq: Once | INTRAVENOUS | Status: AC
  Administered 2018-11-20: 0.25 mg via INTRAVENOUS

## 2018-11-20 MED ORDER — SODIUM CHLORIDE 0.9% FLUSH
10.0000 mL | INTRAVENOUS | Status: DC | PRN
  Administered 2018-11-20: 10 mL
  Filled 2018-11-20: qty 10

## 2018-11-20 MED ORDER — DIPHENHYDRAMINE HCL 50 MG/ML IJ SOLN
50.0000 mg | Freq: Once | INTRAMUSCULAR | Status: AC
  Administered 2018-11-20: 50 mg via INTRAVENOUS

## 2018-11-20 MED ORDER — FAMOTIDINE IN NACL 20-0.9 MG/50ML-% IV SOLN: 20.0000 mg | Freq: Once | INTRAVENOUS | Status: DC

## 2018-11-20 MED ORDER — SODIUM CHLORIDE 0.9 % IV SOLN
Freq: Once | INTRAVENOUS | Status: AC
  Administered 2018-11-20: 10:00:00 via INTRAVENOUS
  Filled 2018-11-20: qty 250

## 2018-11-20 MED ORDER — DEXAMETHASONE SODIUM PHOSPHATE 10 MG/ML IJ SOLN
10.0000 mg | Freq: Once | INTRAMUSCULAR | Status: AC
  Administered 2018-11-20: 10 mg via INTRAVENOUS

## 2018-11-20 MED ORDER — SODIUM CHLORIDE 0.9 % IV SOLN
80.0000 mg/m2 | Freq: Once | INTRAVENOUS | Status: AC
  Administered 2018-11-20: 162 mg via INTRAVENOUS
  Filled 2018-11-20: qty 27

## 2018-11-20 MED ORDER — HEPARIN SOD (PORK) LOCK FLUSH 100 UNIT/ML IV SOLN
500.0000 [IU] | Freq: Once | INTRAVENOUS | Status: AC | PRN
  Administered 2018-11-20: 500 [IU]
  Filled 2018-11-20: qty 5

## 2018-11-20 MED ORDER — SODIUM CHLORIDE 0.9 % IV SOLN: 10.0000 mg | Freq: Once | INTRAVENOUS | Status: DC

## 2018-11-20 MED ORDER — LORAZEPAM 2 MG/ML IJ SOLN
1.0000 mg | Freq: Once | INTRAMUSCULAR | Status: AC
  Administered 2018-11-20: 1 mg via INTRAVENOUS

## 2018-11-20 MED ORDER — PALONOSETRON HCL INJECTION 0.25 MG/5ML
INTRAVENOUS | Status: AC
  Filled 2018-11-20: qty 5

## 2018-11-20 MED ORDER — SODIUM CHLORIDE 0.9 % IV SOLN
20.0000 mg | Freq: Once | INTRAVENOUS | Status: AC
  Administered 2018-11-20: 20 mg via INTRAVENOUS
  Filled 2018-11-20: qty 2

## 2018-11-20 MED ORDER — DIPHENHYDRAMINE HCL 50 MG/ML IJ SOLN
INTRAMUSCULAR | Status: AC
  Filled 2018-11-20: qty 1

## 2018-11-20 MED ORDER — LORAZEPAM 2 MG/ML IJ SOLN
INTRAMUSCULAR | Status: AC
  Filled 2018-11-20: qty 1

## 2018-11-20 MED ORDER — DEXAMETHASONE SODIUM PHOSPHATE 10 MG/ML IJ SOLN
INTRAMUSCULAR | Status: AC
  Filled 2018-11-20: qty 1

## 2018-11-20 NOTE — Assessment & Plan Note (Signed)
08/21/2018:Bilateral mastectomies: Left mastectomy: IDC grade 3, 2.1 cm, high-grade DCIS, margins negative, negative for LV I, 3/9 lymph nodes positive, ER 95% positive, PR 90% positive, HER-2 -1+, Ki-67 30% T2N1A stage IIa right mastectomy: Benign MammaPrint: High risk luminal type B Echocardiogram 09/18/2018: EF 55 to 60% Patient has been enrolled and upbeat clinical trial: No adverse effects related to the trial  Treatment plan: 1.Adjuvant chemotherapy with dose dense Adriamycin and Cytoxan x4 followed by weekly Taxol x12 2.Followed by adjuvant antiestrogen therapy ------------------------------------------------------------------------------------------------------------------ Current Treatment:Completed 4 cycles of dose dense Adriamycin and Cytoxan, today is cycle 1 Taxol Chemo Toxicities: 1.Nausea: Mild to moderate, controlled with anti emetics 2.Severe fatigue:reviewed brief periods of exercise 3. Left arm swelling: better 4.  Skin discoloration of hands and feet  Patient would like to go back to working during Taxol treatments.   RTC weekly for Taxol

## 2018-11-20 NOTE — Patient Instructions (Signed)
[facility] Discharge Instructions for Patients Receiving Chemotherapy  Today you received the following chemotherapy agents Taxol  To help prevent nausea and vomiting after your treatment, we encourage you to take your nausea medication as prescribed.   If you develop nausea and vomiting that is not controlled by your nausea medication, call the clinic.   BELOW ARE SYMPTOMS THAT SHOULD BE REPORTED IMMEDIATELY:  *FEVER GREATER THAN 100.5 F  *CHILLS WITH OR WITHOUT FEVER  NAUSEA AND VOMITING THAT IS NOT CONTROLLED WITH YOUR NAUSEA MEDICATION  *UNUSUAL SHORTNESS OF BREATH  *UNUSUAL BRUISING OR BLEEDING  TENDERNESS IN MOUTH AND THROAT WITH OR WITHOUT PRESENCE OF ULCERS  *URINARY PROBLEMS  *BOWEL PROBLEMS  UNUSUAL RASH Items with * indicate a potential emergency and should be followed up as soon as possible.  Feel free to call the clinic should you have any questions or concerns. The clinic phone number is [phone].  Please show the [place] at check-in to the Emergency Department and triage nurse.  Paclitaxel injection (Taxol) What is this medicine? PACLITAXEL (PAK li TAX el) is a chemotherapy drug. It targets fast dividing cells, like cancer cells, and causes these cells to die. This medicine is used to treat ovarian cancer, breast cancer, lung cancer, Kaposi's sarcoma, and other cancers. This medicine may be used for other purposes; ask your health care provider or pharmacist if you have questions. COMMON BRAND NAME(S): Onxol, Taxol What should I tell my health care provider before I take this medicine? They need to know if you have any of these conditions: -history of irregular heartbeat -liver disease -low blood counts, like low white cell, platelet, or red cell counts -lung or breathing disease, like asthma -tingling of the fingers or toes, or other nerve disorder -an unusual or allergic reaction to paclitaxel, alcohol, polyoxyethylated  castor oil, other chemotherapy, other medicines, foods, dyes, or preservatives -pregnant or trying to get pregnant -breast-feeding How should I use this medicine? This drug is given as an infusion into a vein. It is administered in a hospital or clinic by a specially trained health care professional. Talk to your pediatrician regarding the use of this medicine in children. Special care may be needed. Overdosage: If you think you have taken too much of this medicine contact a poison control center or emergency room at once. NOTE: This medicine is only for you. Do not share this medicine with others. What if I miss a dose? It is important not to miss your dose. Call your doctor or health care professional if you are unable to keep an appointment. What may interact with this medicine? Do not take this medicine with any of the following medications: -disulfiram -metronidazole This medicine may also interact with the following medications: -antiviral medicines for hepatitis, HIV or AIDS -certain antibiotics like erythromycin and clarithromycin -certain medicines for fungal infections like ketoconazole and itraconazole -certain medicines for seizures like carbamazepine, phenobarbital, phenytoin -gemfibrozil -nefazodone -rifampin -St. John's wort This list may not describe all possible interactions. Give your health care provider a list of all the medicines, herbs, non-prescription drugs, or dietary supplements you use. Also tell them if you smoke, drink alcohol, or use illegal drugs. Some items may interact with your medicine. What should I watch for while using this medicine? Your condition will be monitored carefully while you are receiving this medicine. You will need important blood work done while you are taking this medicine. This medicine can cause serious allergic reactions. To reduce your  risk you will need to take other medicine(s) before treatment with this medicine. If you experience  allergic reactions like skin rash, itching or hives, swelling of the face, lips, or tongue, tell your doctor or health care professional right away. In some cases, you may be given additional medicines to help with side effects. Follow all directions for their use. This drug may make you feel generally unwell. This is not uncommon, as chemotherapy can affect healthy cells as well as cancer cells. Report any side effects. Continue your course of treatment even though you feel ill unless your doctor tells you to stop. Call your doctor or health care professional for advice if you get a fever, chills or sore throat, or other symptoms of a cold or flu. Do not treat yourself. This drug decreases your body's ability to fight infections. Try to avoid being around people who are sick. This medicine may increase your risk to bruise or bleed. Call your doctor or health care professional if you notice any unusual bleeding. Be careful brushing and flossing your teeth or using a toothpick because you may get an infection or bleed more easily. If you have any dental work done, tell your dentist you are receiving this medicine. Avoid taking products that contain aspirin, acetaminophen, ibuprofen, naproxen, or ketoprofen unless instructed by your doctor. These medicines may hide a fever. Do not become pregnant while taking this medicine. Women should inform their doctor if they wish to become pregnant or think they might be pregnant. There is a potential for serious side effects to an unborn child. Talk to your health care professional or pharmacist for more information. Do not breast-feed an infant while taking this medicine. Men are advised not to father a child while receiving this medicine. This product may contain alcohol. Ask your pharmacist or healthcare provider if this medicine contains alcohol. Be sure to tell all healthcare providers you are taking this medicine. Certain medicines, like metronidazole and  disulfiram, can cause an unpleasant reaction when taken with alcohol. The reaction includes flushing, headache, nausea, vomiting, sweating, and increased thirst. The reaction can last from 30 minutes to several hours. What side effects may I notice from receiving this medicine? Side effects that you should report to your doctor or health care professional as soon as possible: -allergic reactions like skin rash, itching or hives, swelling of the face, lips, or tongue -breathing problems -changes in vision -fast, irregular heartbeat -high or low blood pressure -mouth sores -pain, tingling, numbness in the hands or feet -signs of decreased platelets or bleeding - bruising, pinpoint red spots on the skin, black, tarry stools, blood in the urine -signs of decreased red blood cells - unusually weak or tired, feeling faint or lightheaded, falls -signs of infection - fever or chills, cough, sore throat, pain or difficulty passing urine -signs and symptoms of liver injury like dark yellow or brown urine; general ill feeling or flu-like symptoms; light-colored stools; loss of appetite; nausea; right upper belly pain; unusually weak or tired; yellowing of the eyes or skin -swelling of the ankles, feet, hands -unusually slow heartbeat Side effects that usually do not require medical attention (report to your doctor or health care professional if they continue or are bothersome): -diarrhea -hair loss -loss of appetite -muscle or joint pain -nausea, vomiting -pain, redness, or irritation at site where injected -tiredness This list may not describe all possible side effects. Call your doctor for medical advice about side effects. You may report side effects to  FDA at 1-800-FDA-1088. Where should I keep my medicine? This drug is given in a hospital or clinic and will not be stored at home. NOTE: This sheet is a summary. It may not cover all possible information. If you have questions about this medicine,  talk to your doctor, pharmacist, or health care provider.  2019 Elsevier/Gold Standard (2017-05-09 13:14:55)

## 2018-11-21 ENCOUNTER — Ambulatory Visit: Payer: BLUE CROSS/BLUE SHIELD | Admitting: Gastroenterology

## 2018-11-21 ENCOUNTER — Telehealth: Payer: Self-pay

## 2018-11-21 NOTE — Telephone Encounter (Signed)
Pt called to request a letter for "return to work", after she had expressed this request to Dr.Gudena, during her last appt. Pt verified that she will not be standing long periods of time and will not be able to do any heavy lifting due to healing surgery. Pt states that she is able to sit and rest at work, when she needs to. Will have return to work letter ready for when she comes back next week for her next taxol appt.

## 2018-11-23 ENCOUNTER — Other Ambulatory Visit: Payer: Self-pay

## 2018-11-23 ENCOUNTER — Encounter (HOSPITAL_COMMUNITY): Payer: Self-pay | Admitting: Emergency Medicine

## 2018-11-23 ENCOUNTER — Emergency Department (HOSPITAL_COMMUNITY)
Admission: EM | Admit: 2018-11-23 | Discharge: 2018-11-23 | Disposition: A | Discharge status: Home / Self Care | Payer: BLUE CROSS/BLUE SHIELD | Attending: Emergency Medicine | Admitting: Emergency Medicine

## 2018-11-23 DIAGNOSIS — Z170 Estrogen receptor positive status [ER+]: Secondary | ICD-10-CM | POA: Diagnosis not present

## 2018-11-23 DIAGNOSIS — R112 Nausea with vomiting, unspecified: Secondary | ICD-10-CM | POA: Diagnosis not present

## 2018-11-23 DIAGNOSIS — Z79899 Other long term (current) drug therapy: Secondary | ICD-10-CM | POA: Insufficient documentation

## 2018-11-23 DIAGNOSIS — R197 Diarrhea, unspecified: Secondary | ICD-10-CM | POA: Diagnosis not present

## 2018-11-23 DIAGNOSIS — R109 Unspecified abdominal pain: Secondary | ICD-10-CM | POA: Diagnosis not present

## 2018-11-23 DIAGNOSIS — Z96653 Presence of artificial knee joint, bilateral: Secondary | ICD-10-CM | POA: Diagnosis not present

## 2018-11-23 DIAGNOSIS — Z91040 Latex allergy status: Secondary | ICD-10-CM | POA: Diagnosis not present

## 2018-11-23 DIAGNOSIS — I10 Essential (primary) hypertension: Secondary | ICD-10-CM | POA: Insufficient documentation

## 2018-11-23 DIAGNOSIS — C50412 Malignant neoplasm of upper-outer quadrant of left female breast: Secondary | ICD-10-CM | POA: Diagnosis not present

## 2018-11-23 DIAGNOSIS — Z87891 Personal history of nicotine dependence: Secondary | ICD-10-CM | POA: Insufficient documentation

## 2018-11-23 DIAGNOSIS — E039 Hypothyroidism, unspecified: Secondary | ICD-10-CM | POA: Insufficient documentation

## 2018-11-23 LAB — COMPREHENSIVE METABOLIC PANEL
ALT: 17 U/L (ref 0–44)
AST: 17 U/L (ref 15–41)
Albumin: 4.2 g/dL (ref 3.5–5.0)
Alkaline Phosphatase: 56 U/L (ref 38–126)
Anion gap: 9 (ref 5–15)
BUN: 8 mg/dL (ref 6–20)
CO2: 24 mmol/L (ref 22–32)
Calcium: 8.6 mg/dL — ABNORMAL LOW (ref 8.9–10.3)
Chloride: 101 mmol/L (ref 98–111)
Creatinine, Ser: 0.57 mg/dL (ref 0.44–1.00)
GFR calc Af Amer: >60 mL/min (ref >60)
GFR calc non Af Amer: >60 mL/min (ref >60)
Glucose, Bld: 106 mg/dL — ABNORMAL HIGH (ref 70–99)
Potassium: 3.4 mmol/L — ABNORMAL LOW (ref 3.5–5.1)
Sodium: 134 mmol/L — ABNORMAL LOW (ref 135–145)
Total Bilirubin: 0.6 mg/dL (ref 0.3–1.2)
Total Protein: 7 g/dL (ref 6.5–8.1)

## 2018-11-23 LAB — URINALYSIS, ROUTINE W REFLEX MICROSCOPIC
Bilirubin Urine: NEGATIVE
Glucose, UA: NEGATIVE mg/dL
Hgb urine dipstick: NEGATIVE
Ketones, ur: NEGATIVE mg/dL
Leukocytes,Ua: NEGATIVE
Nitrite: NEGATIVE
Protein, ur: NEGATIVE mg/dL
Specific Gravity, Urine: 1.003 — ABNORMAL LOW (ref 1.005–1.030)
pH: 6 (ref 5.0–8.0)

## 2018-11-23 LAB — CBC
HCT: 31.7 % — ABNORMAL LOW (ref 36.0–46.0)
Hemoglobin: 9.5 g/dL — ABNORMAL LOW (ref 12.0–15.0)
MCH: 27.2 pg (ref 26.0–34.0)
MCHC: 30 g/dL (ref 30.0–36.0)
MCV: 90.8 fL (ref 80.0–100.0)
Platelets: 339 10*3/uL (ref 150–400)
RBC: 3.49 MIL/uL — ABNORMAL LOW (ref 3.87–5.11)
RDW: 17.1 % — ABNORMAL HIGH (ref 11.5–15.5)
WBC: 5 10*3/uL (ref 4.0–10.5)
nRBC: 0 % (ref 0.0–0.2)

## 2018-11-23 LAB — LIPASE, BLOOD: Lipase: 24 U/L (ref 11–51)

## 2018-11-23 MED ORDER — LOPERAMIDE HCL 2 MG PO CAPS
4.0000 mg | ORAL_CAPSULE | Freq: Once | ORAL | Status: AC
  Administered 2018-11-23: 4 mg via ORAL
  Filled 2018-11-23: qty 2

## 2018-11-23 MED ORDER — SODIUM CHLORIDE 0.9 % IV BOLUS
1000.0000 mL | Freq: Once | INTRAVENOUS | Status: AC
  Administered 2018-11-23: 1000 mL via INTRAVENOUS

## 2018-11-23 MED ORDER — HEPARIN SOD (PORK) LOCK FLUSH 100 UNIT/ML IV SOLN
500.0000 [IU] | Freq: Once | INTRAVENOUS | Status: AC
  Administered 2018-11-23: 500 [IU]
  Filled 2018-11-23: qty 5

## 2018-11-23 NOTE — Discharge Instructions (Addendum)
You can 2 mg of imodium after every time you have diarrhea up 5 times per day.

## 2018-11-23 NOTE — ED Triage Notes (Signed)
Pt reports that she had n/v/d when was here on Sunday, and n/v has stopped but diarrhea has persisted.  Pt reports had chemo treatment this past Tuesday and was told to take Imodium but reports that she hasnt gotten any to try. Reports that everything she eats will "go right through me".

## 2018-11-24 ENCOUNTER — Observation Stay (HOSPITAL_COMMUNITY)
Admission: EM | Admit: 2018-11-24 | Discharge: 2018-11-26 | Disposition: A | Discharge status: Home / Self Care | Payer: BLUE CROSS/BLUE SHIELD | Attending: Family Medicine | Admitting: Family Medicine

## 2018-11-24 ENCOUNTER — Other Ambulatory Visit: Payer: Self-pay

## 2018-11-24 ENCOUNTER — Encounter (HOSPITAL_COMMUNITY): Payer: Self-pay

## 2018-11-24 ENCOUNTER — Observation Stay (HOSPITAL_COMMUNITY): Payer: BLUE CROSS/BLUE SHIELD

## 2018-11-24 DIAGNOSIS — Z87891 Personal history of nicotine dependence: Secondary | ICD-10-CM | POA: Diagnosis not present

## 2018-11-24 DIAGNOSIS — Z170 Estrogen receptor positive status [ER+]: Secondary | ICD-10-CM

## 2018-11-24 DIAGNOSIS — Z79899 Other long term (current) drug therapy: Secondary | ICD-10-CM | POA: Insufficient documentation

## 2018-11-24 DIAGNOSIS — C50919 Malignant neoplasm of unspecified site of unspecified female breast: Secondary | ICD-10-CM | POA: Diagnosis not present

## 2018-11-24 DIAGNOSIS — D649 Anemia, unspecified: Secondary | ICD-10-CM | POA: Insufficient documentation

## 2018-11-24 DIAGNOSIS — C50412 Malignant neoplasm of upper-outer quadrant of left female breast: Secondary | ICD-10-CM

## 2018-11-24 DIAGNOSIS — I10 Essential (primary) hypertension: Secondary | ICD-10-CM | POA: Diagnosis not present

## 2018-11-24 DIAGNOSIS — D72819 Decreased white blood cell count, unspecified: Secondary | ICD-10-CM | POA: Insufficient documentation

## 2018-11-24 DIAGNOSIS — T451X5A Adverse effect of antineoplastic and immunosuppressive drugs, initial encounter: Secondary | ICD-10-CM

## 2018-11-24 DIAGNOSIS — R197 Diarrhea, unspecified: Secondary | ICD-10-CM

## 2018-11-24 DIAGNOSIS — E860 Dehydration: Secondary | ICD-10-CM | POA: Insufficient documentation

## 2018-11-24 DIAGNOSIS — M069 Rheumatoid arthritis, unspecified: Secondary | ICD-10-CM | POA: Diagnosis present

## 2018-11-24 DIAGNOSIS — R112 Nausea with vomiting, unspecified: Secondary | ICD-10-CM | POA: Diagnosis not present

## 2018-11-24 DIAGNOSIS — Z91040 Latex allergy status: Secondary | ICD-10-CM | POA: Diagnosis not present

## 2018-11-24 DIAGNOSIS — E039 Hypothyroidism, unspecified: Secondary | ICD-10-CM | POA: Insufficient documentation

## 2018-11-24 LAB — CBC
HCT: 32.9 % — ABNORMAL LOW (ref 36.0–46.0)
Hemoglobin: 9.7 g/dL — ABNORMAL LOW (ref 12.0–15.0)
MCH: 26.9 pg (ref 26.0–34.0)
MCHC: 29.5 g/dL — ABNORMAL LOW (ref 30.0–36.0)
MCV: 91.1 fL (ref 80.0–100.0)
Platelets: 399 10*3/uL (ref 150–400)
RBC: 3.61 MIL/uL — ABNORMAL LOW (ref 3.87–5.11)
RDW: 17.6 % — ABNORMAL HIGH (ref 11.5–15.5)
WBC: 4 10*3/uL (ref 4.0–10.5)
nRBC: 0 % (ref 0.0–0.2)

## 2018-11-24 LAB — COMPREHENSIVE METABOLIC PANEL
ALT: 18 U/L (ref 0–44)
AST: 17 U/L (ref 15–41)
Albumin: 4 g/dL (ref 3.5–5.0)
Alkaline Phosphatase: 52 U/L (ref 38–126)
Anion gap: 9 (ref 5–15)
BUN: 7 mg/dL (ref 6–20)
CO2: 23 mmol/L (ref 22–32)
Calcium: 8.5 mg/dL — ABNORMAL LOW (ref 8.9–10.3)
Chloride: 107 mmol/L (ref 98–111)
Creatinine, Ser: 0.55 mg/dL (ref 0.44–1.00)
GFR calc Af Amer: >60 mL/min (ref >60)
GFR calc non Af Amer: >60 mL/min (ref >60)
GLUCOSE: 101 mg/dL — AB (ref 70–99)
Potassium: 3.4 mmol/L — ABNORMAL LOW (ref 3.5–5.1)
Sodium: 139 mmol/L (ref 135–145)
Total Bilirubin: 0.3 mg/dL (ref 0.3–1.2)
Total Protein: 6.8 g/dL (ref 6.5–8.1)

## 2018-11-24 LAB — URINALYSIS, ROUTINE W REFLEX MICROSCOPIC
Bilirubin Urine: NEGATIVE
Glucose, UA: NEGATIVE mg/dL
Hgb urine dipstick: NEGATIVE
Ketones, ur: 5 mg/dL — AB
Leukocytes,Ua: NEGATIVE
Nitrite: NEGATIVE
Protein, ur: NEGATIVE mg/dL
Specific Gravity, Urine: 1.011 (ref 1.005–1.030)
pH: 6 (ref 5.0–8.0)

## 2018-11-24 LAB — LIPASE, BLOOD: Lipase: 18 U/L (ref 11–51)

## 2018-11-24 MED ORDER — BENAZEPRIL HCL 10 MG PO TABS
10.0000 mg | ORAL_TABLET | Freq: Every day | ORAL | Status: DC
  Administered 2018-11-25 – 2018-11-26 (×2): 10 mg via ORAL
  Filled 2018-11-24 (×2): qty 1

## 2018-11-24 MED ORDER — LORAZEPAM 2 MG/ML IJ SOLN
1.0000 mg | Freq: Once | INTRAMUSCULAR | Status: AC
  Administered 2018-11-24: 1 mg via INTRAVENOUS
  Filled 2018-11-24: qty 1

## 2018-11-24 MED ORDER — PROMETHAZINE HCL 25 MG PO TABS
25.0000 mg | ORAL_TABLET | Freq: Four times a day (QID) | ORAL | Status: DC | PRN
  Administered 2018-11-25 (×2): 25 mg via ORAL
  Filled 2018-11-24 (×2): qty 1

## 2018-11-24 MED ORDER — SODIUM CHLORIDE 0.9 % IV SOLN
INTRAVENOUS | Status: DC
  Administered 2018-11-24 – 2018-11-26 (×5): via INTRAVENOUS

## 2018-11-24 MED ORDER — LIDOCAINE-PRILOCAINE 2.5-2.5 % EX CREA: 1.0000 "application " | TOPICAL_CREAM | Freq: Every day | CUTANEOUS | Status: DC | PRN

## 2018-11-24 MED ORDER — POLYETHYLENE GLYCOL 3350 17 G PO PACK: 17.0000 g | PACK | Freq: Every day | ORAL | Status: DC | PRN

## 2018-11-24 MED ORDER — LORAZEPAM 1 MG PO TABS: 1.0000 mg | ORAL_TABLET | Freq: Every evening | ORAL | Status: DC | PRN

## 2018-11-24 MED ORDER — HYDROCHLOROTHIAZIDE 12.5 MG PO CAPS
12.5000 mg | ORAL_CAPSULE | Freq: Every day | ORAL | Status: DC
  Administered 2018-11-25 – 2018-11-26 (×2): 12.5 mg via ORAL
  Filled 2018-11-24 (×2): qty 1

## 2018-11-24 MED ORDER — SODIUM CHLORIDE 0.9 % IV SOLN
8.0000 mg | Freq: Four times a day (QID) | INTRAVENOUS | Status: DC | PRN
  Administered 2018-11-24 – 2018-11-26 (×5): 8 mg via INTRAVENOUS
  Filled 2018-11-24 (×6): qty 4

## 2018-11-24 MED ORDER — BENAZEPRIL-HYDROCHLOROTHIAZIDE 10-12.5 MG PO TABS: 1.0000 | ORAL_TABLET | Freq: Every day | ORAL | Status: DC

## 2018-11-24 MED ORDER — SODIUM CHLORIDE 0.9 % IV BOLUS
1000.0000 mL | Freq: Once | INTRAVENOUS | Status: AC
  Administered 2018-11-24: 1000 mL via INTRAVENOUS

## 2018-11-24 MED ORDER — TRAZODONE HCL 50 MG PO TABS
50.0000 mg | ORAL_TABLET | Freq: Once | ORAL | Status: AC
  Administered 2018-11-24: 50 mg via ORAL
  Filled 2018-11-24: qty 1

## 2018-11-24 MED ORDER — PROCHLORPERAZINE MALEATE 10 MG PO TABS: 10.0000 mg | ORAL_TABLET | Freq: Four times a day (QID) | ORAL | Status: DC | PRN

## 2018-11-24 MED ORDER — DIPHENHYDRAMINE HCL 25 MG PO CAPS
25.0000 mg | ORAL_CAPSULE | Freq: Four times a day (QID) | ORAL | Status: DC | PRN
  Filled 2018-11-24: qty 2

## 2018-11-24 MED ORDER — PROMETHAZINE HCL 25 MG/ML IJ SOLN
25.0000 mg | Freq: Once | INTRAMUSCULAR | Status: AC
  Administered 2018-11-24: 25 mg via INTRAVENOUS
  Filled 2018-11-24: qty 1

## 2018-11-24 MED ORDER — ACETAMINOPHEN 650 MG RE SUPP: 650.0000 mg | Freq: Four times a day (QID) | RECTAL | Status: DC | PRN

## 2018-11-24 MED ORDER — SORBITOL 70 % SOLN
30.0000 mL | Freq: Every day | Status: DC | PRN
  Filled 2018-11-24: qty 30

## 2018-11-24 MED ORDER — POTASSIUM CHLORIDE 10 MEQ/100ML IV SOLN
10.0000 meq | INTRAVENOUS | Status: AC
  Administered 2018-11-24 (×4): 10 meq via INTRAVENOUS
  Filled 2018-11-24 (×2): qty 100

## 2018-11-24 MED ORDER — ENOXAPARIN SODIUM 40 MG/0.4ML ~~LOC~~ SOLN
40.0000 mg | SUBCUTANEOUS | Status: DC
  Administered 2018-11-24 – 2018-11-25 (×2): 40 mg via SUBCUTANEOUS
  Filled 2018-11-24 (×3): qty 0.4

## 2018-11-24 MED ORDER — ACETAMINOPHEN 325 MG PO TABS: 650.0000 mg | ORAL_TABLET | Freq: Four times a day (QID) | ORAL | Status: DC | PRN

## 2018-11-24 MED ORDER — SODIUM CHLORIDE 0.9% FLUSH
3.0000 mL | Freq: Once | INTRAVENOUS | Status: AC
  Administered 2018-11-24: 3 mL via INTRAVENOUS

## 2018-11-24 MED ORDER — LEVOTHYROXINE SODIUM 50 MCG PO TABS
175.0000 ug | ORAL_TABLET | Freq: Every day | ORAL | Status: DC
  Administered 2018-11-25 – 2018-11-26 (×2): 175 ug via ORAL
  Filled 2018-11-24 (×2): qty 1

## 2018-11-24 NOTE — ED Triage Notes (Signed)
Patient dropped off by daughter.   C/O nausea and vomiting with some diarrhea.    Told to come in by nurse from cancer center.  This is patients 3rd time coming to ED in past week.  Patient here yesterday and states she got 2 bags of fluid and imodium (patient has been to bathroom once after that and had diarrhea)  still unable to tolerate PO.   Last chemo Tuesday last week and next is this Tuesday.   Patient took phenergan about an hour ago with some relief.   A/Ox4 Ambulatory in triage.   Hx. Breast Cancer.

## 2018-11-24 NOTE — H&P (Signed)
Paula Jacobs is an 58 y.o. female.   Chief Complaint: Intractable nausea and vomiting. HPI: The patient is a 58 yr old woman who is undergoing chemotherapy for breast cancer. She states that she was recently changed to Taxol and was told that it should not make her sick. However the patient state that she thinks that it has made her sicker. In fact she states that the current illness started last Sunday prior to her last chemo on Tuesday. She states that she has had nausea, vomiting and diarrhea. She states that she thought that her diarrhea had stopped, but that she had a loose BM this morning. The patient states that she cannot keep anything down. She denies fever, chills, or rigors. She denies abdominal pain.  The patient carries a past medical history significant for Breast cancer, hypertension, hypothyroidism, anemia,  Rheumatoid arthritis, aneurysm, depression.  Upon arrival at the ED the patient's temperature was 98.6. Her respiratory rate was 17. Her heart rate was 96. Blood pressure was 142/90. She was saturating 95% on room air.  Sodium was 139, potassium was 3.4. Her BUN was 7. Her Creatinine was 0.55. Lipases was 18. LFT's were unremarkable. CBC was 4.0 Hemoglobin was 9.7. Hematocrit was 32.9. Her platelets were 399. Urinalysis demonstrated ketones of 5.   Past Medical History:  Diagnosis Date  . ANEMIA-NOS 04/23/2008  . Aneurysm ([facility]) 10/19/2017   Evaluated by [facility] 10/2017, 25mm, stable  . Anginal pain ([facility])    went to ED in april 2018 c/o chest pain over last 2 months ; had EKG, CXR  and troponin negative per physician suspected musculoskeletal  ; dc'd with ibuprofen  and recc f/u with outpatient stress test; see care everywhere ED visit  ; patient denies recurrence of Chest pain since, endorses occ palpitations   . DEPRESSION 04/23/2008  . DJD (degenerative joint disease)   . Family history of breast cancer   . Hyperplastic colon polyp    x2  . Hypertension   . HYPOTHYROIDISM  10/17/2007  . Osteoarthritis   . Palpitations    freq at night;  at pre-op states she drinks caffeine and that makes it worse; says the palpitations started after they took her thyroid   . PAT 10/27/2009   Qualifier: Diagnosis of  By: [name], MD, [name]    . Pneumonia    "in the past, havent had it in years"  . PONV (postoperative nausea and vomiting)    only after 1979 surgery  . RA (rheumatoid arthritis) ([facility])    "problems in feet, hands, and knees"  . 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 SVT (supraventricular tachycardia) ([facility])    chronic   . Tubulovillous adenoma of colon     Past Surgical History:  Procedure Laterality Date  . ABDOMINAL HYSTERECTOMY    . APPENDECTOMY    . BUNIONECTOMY  10/2009 right & 02/03/10 left  . COLONOSCOPY W/ POLYPECTOMY  2015  . HAMMER TOE SURGERY     "all toes have pins, done with bunionectomy"  . INGUINAL HERNIA REPAIR    . KNEE CLOSED REDUCTION Left 12/05/2017   Procedure: CLOSED MANIPULATION LEFT KNEE;  Surgeon: [name], MD;  Location: [facility];  Service: Orthopedics;  Laterality: Left;  Marland Kitchen MASTECTOMY WITH RADIOACTIVE SEED GUIDED EXCISION AND AXILLARY SENTINEL LYMPH NODE BIOPSY Bilateral 08/21/2018   Procedure: BILATERAL SIMPLE MASTECTOMIES WITH LEFT AXILLARY RADIOACTIVE SEED GUIDED LYMPH NODE EXCISION AND LEFT AXILLARY SENTINEL LYMPH NODE BIOPSY;  Surgeon:  Erroll Luna, MD;  Location: [facility];  Service: General;  Laterality: Bilateral;  . neck fusion     x3  . PORTACATH PLACEMENT Right 09/25/2018   Procedure: INSERTION PORT-A-CATH WITH ULTRASOUND ERAS PATHWAY;  Surgeon: Erroll Luna, MD;  Location: [facility];  Service: General;  Laterality: Right;  . THYROIDECTOMY  2001   for nodules  . TOE AMPUTATION  1979   6th toe removed from each foot  . TOTAL KNEE ARTHROPLASTY Left 10/21/2014   Procedure: LEFT TOTAL KNEE ARTHROPLASTY;  Surgeon: [name], MD;  Location: [facility];  Service: Orthopedics;  Laterality: Left;   . TOTAL KNEE ARTHROPLASTY Right 12/05/2017   Procedure: RIGHT TOTAL KNEE ARTHROPLASTY;  Surgeon: [name], MD;  Location: [facility];  Service: Orthopedics;  Laterality: Right;  70 mins    Family History  Problem Relation Age of Onset  . Breast cancer Mother 51       metastatic to brain, died at 13  . Dementia Father        died at 14  . Heart attack Maternal Grandmother   . Stroke Maternal Grandmother   . Diabetes Maternal Grandmother   . Heart disease Maternal Grandmother   . Thyroid disease Maternal Grandmother   . COPD Maternal Aunt   . Cancer Paternal Uncle        details unk  . Esophageal cancer Neg Hx   . Rectal cancer Neg Hx   . Stomach cancer Neg Hx    Social History:  reports that she quit smoking about 7 years ago. She has a 16.50 pack-year smoking history. She has never used smokeless tobacco. She reports current alcohol use. She reports that she does not use drugs. (Not in a hospital admission)   Allergies:  Allergies  Allergen Reactions  . Compazine [Prochlorperazine Edisylate] Anxiety  . Hydrocodone-Acetaminophen Itching  . Latex Itching  . Tramadol Hcl Itching and Nausea Only    Pertinent items are noted in HPI.   General appearance: alert, cooperative and mild distress Head: Normocephalic, without obvious abnormality, atraumatic Eyes: conjunctivae/corneas clear. PERRL, EOM's intact. Fundi benign. Throat: lips, mucosa, and tongue normal; teeth and gums normal Neck: no adenopathy, no carotid bruit, no JVD, supple, symmetrical, trachea midline and thyroid not enlarged, symmetric, no tenderness/mass/nodules Resp: There is no increased work of breathing. No wheezes, rales, or rhonchi. No tactile fremitus. Chest wall: no tenderness Cardio: regular rate and rhythm, S1, S2 normal, no murmur, click, rub or gallop GI: Soft, non-tender, slightly distended. No organomegaly, masses, or hernias are appreciated. Extremities: extremities normal, atraumatic, no  cyanosis or edema Pulses: 2+ and symmetric Skin: Skin color, texture, turgor normal. No rashes or lesions Lymph nodes: Cervical, supraclavicular, and axillary nodes normal. Neurologic: Grossly normal  Results for orders placed or performed during the hospital encounter of 11/24/18 (from the past 48 hour(s))  Urinalysis, Routine w reflex microscopic     Status: Abnormal   Collection Time: 11/24/18  1:27 PM  Result Value Ref Range   Color, Urine STRAW (A) YELLOW   APPearance CLEAR CLEAR   Specific Gravity, Urine 1.011 1.005 - 1.030   pH 6.0 5.0 - 8.0   Glucose, UA NEGATIVE NEGATIVE mg/dL   Hgb urine dipstick NEGATIVE NEGATIVE   Bilirubin Urine NEGATIVE NEGATIVE   Ketones, ur 5 (A) NEGATIVE mg/dL   Protein, ur NEGATIVE NEGATIVE mg/dL   Nitrite NEGATIVE NEGATIVE   Leukocytes,Ua NEGATIVE NEGATIVE    Comment: Performed at [facility], [address] [place], [place] [address]  Lipase, blood     Status: None   Collection Time: 11/24/18  2:00 PM  Result Value Ref Range   Lipase 18 11 - 51 U/L    Comment: Performed at [facility], [place] [address] [place], [place] [address]  Comprehensive metabolic panel     Status: Abnormal   Collection Time: 11/24/18  2:00 PM  Result Value Ref Range   Sodium 139 135 - 145 mmol/L   Potassium 3.4 (L) 3.5 - 5.1 mmol/L   Chloride 107 98 - 111 mmol/L   CO2 23 22 - 32 mmol/L   Glucose, Bld 101 (H) 70 - 99 mg/dL   BUN 7 6 - 20 mg/dL   Creatinine, Ser 0.55 0.44 - 1.00 mg/dL   Calcium 8.5 (L) 8.9 - 10.3 mg/dL   Total Protein 6.8 6.5 - 8.1 g/dL   Albumin 4.0 3.5 - 5.0 g/dL   AST 17 15 - 41 U/L   ALT 18 0 - 44 U/L   Alkaline Phosphatase 52 38 - 126 U/L   Total Bilirubin 0.3 0.3 - 1.2 mg/dL   GFR calc non Af Amer >60 >60 mL/min   GFR calc Af Amer >60 >60 mL/min   Anion gap 9 5 - 15    Comment: Performed at [facility], [place] [address] [place], [place] [address]  CBC     Status: Abnormal    Collection Time: 11/24/18  2:00 PM  Result Value Ref Range   WBC 4.0 4.0 - 10.5 K/uL   RBC 3.61 (L) 3.87 - 5.11 MIL/uL   Hemoglobin 9.7 (L) 12.0 - 15.0 g/dL   HCT 32.9 (L) 36.0 - 46.0 %   MCV 91.1 80.0 - 100.0 fL   MCH 26.9 26.0 - 34.0 pg   MCHC 29.5 (L) 30.0 - 36.0 g/dL   RDW 17.6 (H) 11.5 - 15.5 %   Platelets 399 150 - 400 K/uL   nRBC 0.0 0.0 - 0.2 %    Comment: Performed at [facility], [place] [address], [place], [place] [address]   @[email or web address] @  Blood pressure 128/84, pulse 86, temperature 98.6 F (37 C), temperature source Oral, resp. rate 20, height 5\' 2"  (1.575 m), weight 84.9 kg, SpO2 97 %.    Assessment/Plan Problem  Intractable Nausea and Vomiting  Malignant Neoplasm of Upper-Outer Quadrant of Left Breast in Female, Estrogen Receptor Positive ([facility])  Rheumatoid Arthritis ([facility])  Hypertension  Hypothyroidism   The patient will be admitted to a medical bed. She will receive IV fluids and antiemetics. She will be continued on her home medications as possible. Should she have further loose stools it will be sent for studies. I will check an x-ray of her abdomen to rule out obstruction as a cause of her nausea and vomiting as the timing does not seem right for post chemotherapy nausea and vomiting.  I have seen and examined this patient myself. I have spent 70 minutes in her evaluation and care.  Taha Dimond 11/24/2018, 6:11 PM

## 2018-11-24 NOTE — Progress Notes (Signed)
Attempted to call for report, waited on hold with no response.

## 2018-11-24 NOTE — ED Provider Notes (Signed)
[facility] DEPT Provider Note   CSN: [id number] Arrival date & time: 11/24/18  1316    History   Chief Complaint Chief Complaint  Patient presents with  . Emesis  . Cancer patient    HPI Paula Jacobs is a 58 y.o. female.     The history is provided by the patient and medical records. No language interpreter was used.  Emesis  Severity:  Severe Duration:  1 week Timing:  Constant Number of daily episodes:  Numerous Quality:  Stomach contents Feeding tolerance: no solids or liquids. Progression:  Worsening Chronicity:  Recurrent Recent urination:  Normal Relieved by:  Nothing Worsened by:  Nothing Ineffective treatments:  None tried Associated symptoms: diarrhea   Associated symptoms: no abdominal pain, no chills, no cough, no fever, no headaches and no sore throat   Risk factors comment:  Chemo   Past Medical History:  Diagnosis Date  . ANEMIA-NOS 04/23/2008  . Aneurysm ([place]) 10/19/2017   Evaluated by MRA 10/2017, 71mm, stable  . Anginal pain ([place])    went to ED in april 2018 c/o chest pain over last 2 months ; had EKG, CXR  and troponin negative per physician suspected musculoskeletal  ; dc'd with ibuprofen  and recc f/u with outpatient stress test; see care everywhere ED visit  ; patient denies recurrence of Chest pain since, endorses occ palpitations   . DEPRESSION 04/23/2008  . DJD (degenerative joint disease)   . Family history of breast cancer   . Hyperplastic colon polyp    x2  . Hypertension   . HYPOTHYROIDISM 10/17/2007  . Osteoarthritis   . Palpitations    freq at night;  at pre-op states she drinks caffeine and that makes it worse; says the palpitations started after they took her thyroid   . PAT 10/27/2009   Qualifier: Diagnosis of  By: [name], MD, [name]    . Pneumonia    "in the past, havent had it in years"  . PONV (postoperative nausea and vomiting)    only after 1979 surgery  . RA (rheumatoid arthritis) ([place])      "problems in feet, hands, and knees"  . 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 SVT (supraventricular tachycardia) (HCC)    chronic   . Tubulovillous adenoma of colon     Patient Active Problem List   Diagnosis Date Noted  . Port-A-Cath in place 09/26/2018  . Breast cancer, left ([place]) 08/22/2018  . Breast cancer, stage 2, left ([place]) 08/21/2018  . Genetic testing 08/08/2018  . Family history of breast cancer   . Malignant neoplasm of upper-outer quadrant of left breast in female, estrogen receptor positive ([place]) 07/19/2018  . 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 Aneurysm ([place]) 10/19/2017  . Morbid obesity ([place]) 10/22/2014  . 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 Rheumatoid arthritis ([place]) 09/26/2013  . Hypertension 01/31/2011  . Hypothyroidism 10/17/2007    Past Surgical History:  Procedure Laterality Date  . ABDOMINAL HYSTERECTOMY    . APPENDECTOMY    . BUNIONECTOMY  10/2009 right & 02/03/10 left  . COLONOSCOPY W/ POLYPECTOMY  2015  . HAMMER TOE SURGERY     "all toes have pins, done with bunionectomy"  . INGUINAL HERNIA REPAIR    . KNEE CLOSED REDUCTION Left 12/05/2017   Procedure: CLOSED MANIPULATION LEFT KNEE;  Surgeon: [name], MD;  Location: [facility];  Service: Orthopedics;  Laterality: Left;  Marland Kitchen MASTECTOMY WITH RADIOACTIVE SEED GUIDED EXCISION  AND AXILLARY SENTINEL LYMPH NODE BIOPSY Bilateral 08/21/2018   Procedure: BILATERAL SIMPLE MASTECTOMIES WITH LEFT AXILLARY RADIOACTIVE SEED GUIDED LYMPH NODE EXCISION AND LEFT AXILLARY SENTINEL LYMPH NODE BIOPSY;  Surgeon: Erroll Luna, MD;  Location: [facility];  Service: General;  Laterality: Bilateral;  . neck fusion     x3  . PORTACATH PLACEMENT Right 09/25/2018   Procedure: INSERTION PORT-A-CATH WITH ULTRASOUND ERAS PATHWAY;  Surgeon: Erroll Luna, MD;  Location: [facility];  Service: General;  Laterality: Right;  . THYROIDECTOMY  2001   for nodules  . TOE AMPUTATION  1979   6th toe removed from each foot  . TOTAL  KNEE ARTHROPLASTY Left 10/21/2014   Procedure: LEFT TOTAL KNEE ARTHROPLASTY;  Surgeon: [name], MD;  [facility];  Service: Orthopedics;  Laterality: Left;  . TOTAL KNEE ARTHROPLASTY Right 12/05/2017   Procedure: RIGHT TOTAL KNEE ARTHROPLASTY;  Surgeon: [name], MD;  [facility];  Service: Orthopedics;  Laterality: Right;  70 mins     OB History   No obstetric history on file.      Home Medications    Prior to Admission medications   Medication Sig Start Date End Date Taking? Authorizing Provider  acetaminophen (TYLENOL) 500 MG tablet Take 1,000 mg by mouth every 6 (six) hours as needed for moderate pain.    [provider]  benazepril-hydrochlorthiazide (LOTENSIN HCT) 10-12.5 MG tablet TAKE 1 TABLET BY MOUTH DAILY 06/26/18   [name], DO  diphenhydrAMINE (BENADRYL) 25 MG tablet Take 25-50 mg by mouth every 6 (six) hours as needed for allergies.    [provider]  levothyroxine (SYNTHROID, LEVOTHROID) 175 MCG tablet TAKE 1 TABLET BY MOUTH EVERY MORNING Patient taking differently: Take 175 mcg by mouth daily before breakfast.  07/16/18   [name], DO  lidocaine-prilocaine (EMLA) cream Apply to affected area once 09/06/18   Nicholas Lose, MD  LORazepam (ATIVAN) 1 MG tablet Take 1 tablet (1 mg total) by mouth at bedtime as needed for sleep. 10/09/18   Nicholas Lose, MD  ondansetron (ZOFRAN) 8 MG tablet Take 1 tablet (8 mg total) by mouth 2 (two) times daily as needed. Start on the third day after chemotherapy. Patient not taking: Reported on 11/23/2018 09/06/18   Nicholas Lose, MD  prochlorperazine (COMPAZINE) 10 MG tablet Take 1 tablet (10 mg total) by mouth every 6 (six) hours as needed (Nausea or vomiting). Patient not taking: Reported on 11/18/2018 10/18/18   Nicholas Lose, MD  promethazine (PHENERGAN) 25 MG tablet Take 1 tablet (25 mg total) by mouth every 6 (six) hours as needed for up to 3 days for nausea or vomiting. 11/18/18 11/23/18  Albesa Seen, PA-C    Family History Family History  Problem Relation Age of Onset  . Breast cancer Mother 8       metastatic to brain, died at 60  . Dementia Father        died at 45  . Heart attack Maternal Grandmother   . Stroke Maternal Grandmother   . Diabetes Maternal Grandmother   . Heart disease Maternal Grandmother   . Thyroid disease Maternal Grandmother   . COPD Maternal Aunt   . Cancer Paternal Uncle        details unk  . Esophageal cancer Neg Hx   . Rectal cancer Neg Hx   . Stomach cancer Neg Hx     Social History Social History   Tobacco Use  . Smoking status:  Former Smoker    Packs/day: 0.50    Years: 33.00    Pack years: 16.50    Last attempt to quit: 09/15/2011    Years since quitting: 7.1  . Smokeless tobacco: Never Used  Substance Use Topics  . Alcohol use: Yes    Comment: rare  . Drug use: No     Allergies   Compazine [prochlorperazine edisylate]; Hydrocodone-acetaminophen; Latex; and Tramadol hcl   Review of Systems Review of Systems  Constitutional: Positive for fatigue. Negative for chills, diaphoresis and fever.  HENT: Negative for congestion and sore throat.   Eyes: Negative for visual disturbance.  Respiratory: Negative for cough, chest tightness, shortness of breath, wheezing and stridor.   Cardiovascular: Negative for chest pain, palpitations and leg swelling.  Gastrointestinal: Positive for diarrhea, nausea and vomiting. Negative for abdominal pain and constipation.  Genitourinary: Negative for flank pain and frequency.  Musculoskeletal: Negative for back pain, neck pain and neck stiffness.  Skin: Negative for rash and wound.  Neurological: Positive for light-headedness (mild). Negative for dizziness, weakness and headaches.  Psychiatric/Behavioral: Negative for agitation.  All other systems reviewed and are negative.    Physical Exam Updated Vital Signs BP (!) 142/90 (BP Location: Right Arm)   Pulse 96   Temp 98.6 F (37 C)  (Oral)   Resp 17   Ht 5\' 2"  (1.575 m)   Wt 84.9 kg   SpO2 95%   BMI 34.25 kg/m   Physical Exam Vitals signs and nursing note reviewed.  Constitutional:      General: She is not in acute distress.    Appearance: She is well-developed. She is ill-appearing. She is not toxic-appearing or diaphoretic.  HENT:     Head: Normocephalic and atraumatic.     Nose: No congestion or rhinorrhea.     Mouth/Throat:     Mouth: Mucous membranes are dry.     Pharynx: No oropharyngeal exudate or posterior oropharyngeal erythema.  Eyes:     Extraocular Movements: Extraocular movements intact.     Conjunctiva/sclera: Conjunctivae normal.     Pupils: Pupils are equal, round, and reactive to light.  Neck:     Musculoskeletal: Neck supple. No muscular tenderness.  Cardiovascular:     Rate and Rhythm: Normal rate and regular rhythm.     Pulses: Normal pulses.     Heart sounds: No murmur.  Pulmonary:     Effort: Pulmonary effort is normal. No respiratory distress.     Breath sounds: Normal breath sounds. No wheezing, rhonchi or rales.  Chest:     Chest wall: No tenderness.  Abdominal:     General: Abdomen is flat. There is no distension.     Palpations: Abdomen is soft.     Tenderness: There is no abdominal tenderness.  Musculoskeletal:        General: No tenderness.     Right lower leg: No edema.     Left lower leg: No edema.  Skin:    General: Skin is warm and dry.     Capillary Refill: Capillary refill takes less than 2 seconds.     Findings: No erythema or rash.  Neurological:     General: No focal deficit present.     Mental Status: She is alert.  Psychiatric:        Mood and Affect: Mood normal.      ED Treatments / Results  Labs (all labs ordered are listed, but only abnormal results are displayed) Labs Reviewed  COMPREHENSIVE METABOLIC  PANEL - Abnormal; Notable for the following components:      Result Value   Potassium 3.4 (*)    Glucose, Bld 101 (*)    Calcium 8.5 (*)      All other components within normal limits  CBC - Abnormal; Notable for the following components:   RBC 3.61 (*)    Hemoglobin 9.7 (*)    HCT 32.9 (*)    MCHC 29.5 (*)    RDW 17.6 (*)    All other components within normal limits  LIPASE, BLOOD  URINALYSIS, ROUTINE W REFLEX MICROSCOPIC    EKG None  Radiology No results found.  Procedures Procedures (including critical care time)  Medications Ordered in ED Medications  promethazine (PHENERGAN) injection 25 mg (has no administration in time range)  sodium chloride flush (NS) 0.9 % injection 3 mL (3 mLs Intravenous Given 11/24/18 1359)  LORazepam (ATIVAN) injection 1 mg (1 mg Intravenous Given 11/24/18 1440)  sodium chloride 0.9 % bolus 1,000 mL (1,000 mLs Intravenous New Bag/Given 11/24/18 1440)     Initial Impression / Assessment and Plan / ED Course  I have reviewed the triage vital signs and the nursing notes.  Pertinent labs & imaging results that were available during my care of the patient were reviewed by me and considered in my medical decision making (see chart for details).        Paula Jacobs is a 59 y.o. female with a past medical history significant for hypertension, hypothyroidism, depression, anemia, and prior breast cancer status post mastectomy and currently undergoing chemotherapy who presents for uncontrolled nausea and vomiting.  Patient reports that is the third visit this week for severe nausea and vomiting.  She reports that she comes and is able to get some fluid, get some medications, and is been discharged home however she says that this time she has not been able to tolerate any fluids or water overnight.  She reports uncontrolled and numerous episodes of nausea and vomiting.  She also reports she has had some diarrhea overnight.  She denies significant abdominal pain but does feel very fatigued and occasionally lightheaded.  She denies chest pain palpitations congestion, or cough.  She denies any  urinary symptoms.  She says that she has been using all of her medications at home without success and is concerned about going back, getting more dehydrated.  On exam, patient's lungs are clear and chest is nontender.  Abdomen is nontender.  No murmur appreciated.  Patient has a port in her right chest.  Legs are nontender nonedematous.  Patient uncomfortable with nausea.  Patient clinically appears dehydrated with dry mucous membranes.  Patient given fluids.  She reports that the Ativan has helped her with nausea in the past.  She also reports that Phenergan has helped her.   Patient will be given Ativan and Phenergan as well as fluids.  She will have screening laboratories and look for electrolyte imbalance with the nausea vomiting, diarrhea.  This is the patient's third visit this week and she is not tolerating any p.o. or fluids, anticipate she will require admission for rehydration and monitoring.  3:30 PM Care transferred to Dr. Vallery Ridge while awaiting reassessment after labs and fluids and nausea medications.  Anticipate admission given her third visit and intolerance of any p.o. at this time with clinical dehydration.   Final Clinical Impressions(s) / ED Diagnoses   Final diagnoses:  Dehydration  Nausea vomiting and diarrhea     Clinical Impression: 1. Dehydration  2. Nausea vomiting and diarrhea     Disposition: Awaiting reassessment after rehydration and medications and labs.  Anticipate admission as she continues to fail maintaining hydration as an outpatient.  This note was prepared with assistance of Systems analyst. Occasional wrong-word or sound-a-like substitutions may have occurred due to the inherent limitations of voice recognition software.      , Gwenyth Allegra, MD 11/24/18 [phone]

## 2018-11-24 NOTE — ED Notes (Signed)
ED TO INPATIENT HANDOFF REPORT  ED Nurse Name and Phone #: [phone]  S Name/Age/Gender Paula Jacobs 58 y.o. female Room/Bed: WA20/WA20  Code Status   Code Status: Full Code  Home/SNF/Other Home Patient oriented to: self, place, time and situation Is this baseline? Yes   Triage Complete: Triage complete  Chief Complaint chemo pt;emesis  Triage Note Patient dropped off by daughter.   C/O nausea and vomiting with some diarrhea.    Told to come in by nurse from cancer center.  This is patients 3rd time coming to ED in past week.  Patient here yesterday and states she got 2 bags of fluid and imodium (patient has been to bathroom once after that and had diarrhea)  still unable to tolerate PO.   Last chemo Tuesday last week and next is this Tuesday.   Patient took phenergan about an hour ago with some relief.   A/Ox4 Ambulatory in triage.   Hx. Breast Cancer.    Allergies Allergies  Allergen Reactions  . Compazine [Prochlorperazine Edisylate] Anxiety  . Hydrocodone-Acetaminophen Itching  . Latex Itching  . Tramadol Hcl Itching and Nausea Only    Level of Care/Admitting Diagnosis ED Disposition    ED Disposition Condition Comment   Admit  Hospital Area: [facility] [[id number]]  Level of Care: Med-Surg [16]  Diagnosis: Intractable nausea and vomiting [147829]  Admitting Physician: Karie Kirks [phone]  Attending Physician: Benny Lennert, AVA [[id number]]  PT Class (Do Not Modify): Observation [104]  PT Acc Code (Do Not Modify): Observation [10022]       B Medical/Surgery History Past Medical History:  Diagnosis Date  . ANEMIA-NOS 04/23/2008  . Aneurysm ([place]) 10/19/2017   Evaluated by MRA 10/2017, 24mm, stable  . Anginal pain ([place])    went to ED in april 2018 c/o chest pain over last 2 months ; had EKG, CXR  and troponin negative per physician suspected musculoskeletal  ; dc'd with ibuprofen  and recc f/u with outpatient stress test; see care  everywhere ED visit  ; patient denies recurrence of Chest pain since, endorses occ palpitations   . DEPRESSION 04/23/2008  . DJD (degenerative joint disease)   . Family history of breast cancer   . Hyperplastic colon polyp    x2  . Hypertension   . HYPOTHYROIDISM 10/17/2007  . Osteoarthritis   . Palpitations    freq at night;  at pre-op states she drinks caffeine and that makes it worse; says the palpitations started after they took her thyroid   . PAT 10/27/2009   Qualifier: Diagnosis of  By: [name], MD, [name]    . Pneumonia    "in the past, havent had it in years"  . PONV (postoperative nausea and vomiting)    only after 1979 surgery  . RA (rheumatoid arthritis) ([place])    "problems in feet, hands, and knees"  . 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 SVT (supraventricular tachycardia) ([facility])    chronic   . Tubulovillous adenoma of colon    Past Surgical History:  Procedure Laterality Date  . ABDOMINAL HYSTERECTOMY    . APPENDECTOMY    . BUNIONECTOMY  10/2009 right & 02/03/10 left  . COLONOSCOPY W/ POLYPECTOMY  2015  . HAMMER TOE SURGERY     "all toes have pins, done with bunionectomy"  . INGUINAL HERNIA REPAIR    . KNEE CLOSED REDUCTION Left 12/05/2017   Procedure: CLOSED MANIPULATION LEFT KNEE;  Surgeon: [name], MD;  [facility];  Service: Orthopedics;  Laterality: Left;  Marland Kitchen MASTECTOMY WITH RADIOACTIVE SEED GUIDED EXCISION AND AXILLARY SENTINEL LYMPH NODE BIOPSY Bilateral 08/21/2018   Procedure: BILATERAL SIMPLE MASTECTOMIES WITH LEFT AXILLARY RADIOACTIVE SEED GUIDED LYMPH NODE EXCISION AND LEFT AXILLARY SENTINEL LYMPH NODE BIOPSY;  Surgeon: Erroll Luna, MD;  Location: [facility];  Service: General;  Laterality: Bilateral;  . neck fusion     x3  . PORTACATH PLACEMENT Right 09/25/2018   Procedure: INSERTION PORT-A-CATH WITH ULTRASOUND ERAS PATHWAY;  Surgeon: Erroll Luna, MD;  Location: [facility];  Service: General;  Laterality: Right;  .  THYROIDECTOMY  2001   for nodules  . TOE AMPUTATION  1979   6th toe removed from each foot  . TOTAL KNEE ARTHROPLASTY Left 10/21/2014   Procedure: LEFT TOTAL KNEE ARTHROPLASTY;  Surgeon: [name], MD;  [facility];  Service: Orthopedics;  Laterality: Left;  . TOTAL KNEE ARTHROPLASTY Right 12/05/2017   Procedure: RIGHT TOTAL KNEE ARTHROPLASTY;  Surgeon: [name], MD;  [facility];  Service: Orthopedics;  Laterality: Right;  70 mins     A IV Location/Drains/Wounds Patient Lines/Drains/Airways Status   Active Line/Drains/Airways    Name:   Placement date:   Placement time:   Site:   Days:   Implanted Port 09/25/18 Right Chest   09/25/18    1248    Chest   60   Closed System Drain 1 Right;Lateral Breast Bulb (JP) 19 Fr.   08/21/18    1251    Breast   95   Closed System Drain 2 Right;Medial Breast Bulb (JP) 19 Fr.   08/21/18    1252    Breast   95   Closed System Drain 3 Left;Lateral Breast Bulb (JP) 19 Fr.   08/21/18    1351    Breast   95   Closed System Drain 4 Left;Medial Breast Bulb (JP) 19 Fr.   08/21/18    1351    Breast   95   Incision (Closed) 12/05/17 Knee Right   12/05/17    1349     354   Incision (Closed) 08/21/18 Chest   08/21/18    1356     95   Incision (Closed) 09/25/18 Chest Right   09/25/18    1159     60          Intake/Output Last 24 hours No intake or output data in the 24 hours ending 11/24/18 1748  Labs/Imaging Results for orders placed or performed during the hospital encounter of 11/24/18 (from the past 48 hour(s))  Urinalysis, Routine w reflex microscopic     Status: Abnormal   Collection Time: 11/24/18  1:27 PM  Result Value Ref Range   Color, Urine STRAW (A) YELLOW   APPearance CLEAR CLEAR   Specific Gravity, Urine 1.011 1.005 - 1.030   pH 6.0 5.0 - 8.0   Glucose, UA NEGATIVE NEGATIVE mg/dL   Hgb urine dipstick NEGATIVE NEGATIVE   Bilirubin Urine NEGATIVE NEGATIVE   Ketones, ur 5 (A) NEGATIVE mg/dL   Protein, ur NEGATIVE NEGATIVE  mg/dL   Nitrite NEGATIVE NEGATIVE   Leukocytes,Ua NEGATIVE NEGATIVE    Comment: Performed at [facility], [place] [address] [place], [place] [address]  Lipase, blood     Status: None   Collection Time: 11/24/18  2:00 PM  Result Value Ref Range   Lipase 18 11 - 51 U/L    Comment: Performed at [facility], [place] [address] [place], [place] [address]  Comprehensive metabolic panel     Status: Abnormal   Collection Time: 11/24/18  2:00 PM  Result Value Ref Range   Sodium 139 135 - 145 mmol/L   Potassium 3.4 (L) 3.5 - 5.1 mmol/L   Chloride 107 98 - 111 mmol/L   CO2 23 22 - 32 mmol/L   Glucose, Bld 101 (H) 70 - 99 mg/dL   BUN 7 6 - 20 mg/dL   Creatinine, Ser 0.55 0.44 - 1.00 mg/dL   Calcium 8.5 (L) 8.9 - 10.3 mg/dL   Total Protein 6.8 6.5 - 8.1 g/dL   Albumin 4.0 3.5 - 5.0 g/dL   AST 17 15 - 41 U/L   ALT 18 0 - 44 U/L   Alkaline Phosphatase 52 38 - 126 U/L   Total Bilirubin 0.3 0.3 - 1.2 mg/dL   GFR calc non Af Amer >60 >60 mL/min   GFR calc Af Amer >60 >60 mL/min   Anion gap 9 5 - 15    Comment: Performed at [facility], [place] [address] [place], [place] [address]  CBC     Status: Abnormal   Collection Time: 11/24/18  2:00 PM  Result Value Ref Range   WBC 4.0 4.0 - 10.5 K/uL   RBC 3.61 (L) 3.87 - 5.11 MIL/uL   Hemoglobin 9.7 (L) 12.0 - 15.0 g/dL   HCT 32.9 (L) 36.0 - 46.0 %   MCV 91.1 80.0 - 100.0 fL   MCH 26.9 26.0 - 34.0 pg   MCHC 29.5 (L) 30.0 - 36.0 g/dL   RDW 17.6 (H) 11.5 - 15.5 %   Platelets 399 150 - 400 K/uL   nRBC 0.0 0.0 - 0.2 %    Comment: Performed at [facility], [place] [address] [place], [place] [address]   No results found.  Pending Labs Unresulted Labs (From admission, onward)    Start     Ordered   12/01/18 0500  Creatinine, serum  (enoxaparin (LOVENOX)    CrCl >/= 30 ml/min)  Weekly,   R    Comments:  while on enoxaparin therapy    11/24/18 1649   11/25/18 0500  Comprehensive  metabolic panel  Tomorrow morning,   R     11/24/18 1649   11/25/18 0500  CBC  Tomorrow morning,   R     11/24/18 1649   11/24/18 1648  HIV antibody (Routine Testing)  Once,   R     11/24/18 1649   11/24/18 1646  CBC  (enoxaparin (LOVENOX)    CrCl >/= 30 ml/min)  Once,   R    Comments:  Baseline for enoxaparin therapy IF NOT ALREADY DRAWN.  Notify MD if PLT < 100 K.    11/24/18 1649   11/24/18 1646  Creatinine, serum  (enoxaparin (LOVENOX)    CrCl >/= 30 ml/min)  Once,   R    Comments:  Baseline for enoxaparin therapy IF NOT ALREADY DRAWN.    11/24/18 1649          Vitals/Pain Today's Vitals   11/24/18 1534 11/24/18 1630 11/24/18 1655 11/24/18 1702  BP: 109/68 126/82  128/84  Pulse: 79 74  86  Resp: 18 19  20   Temp:      TempSrc:      SpO2: 93% 97%  97%  Weight:      Height:      PainSc:   0-No pain  Isolation Precautions No active isolations  Medications Medications  enoxaparin (LOVENOX) injection 40 mg (has no administration in time range)  0.9 %  sodium chloride infusion (has no administration in time range)  acetaminophen (TYLENOL) tablet 650 mg (has no administration in time range)    Or  acetaminophen (TYLENOL) suppository 650 mg (has no administration in time range)  polyethylene glycol (MIRALAX / GLYCOLAX) packet 17 g (has no administration in time range)  sorbitol 70 % solution 30 mL (has no administration in time range)  potassium chloride 10 mEq in 100 mL IVPB (has no administration in time range)  ondansetron (ZOFRAN) 8 mg in sodium chloride 0.9 % 50 mL IVPB (has no administration in time range)  sodium chloride flush (NS) 0.9 % injection 3 mL (3 mLs Intravenous Given 11/24/18 1359)  LORazepam (ATIVAN) injection 1 mg (1 mg Intravenous Given 11/24/18 1440)  sodium chloride 0.9 % bolus 1,000 mL (1,000 mLs Intravenous New Bag/Given 11/24/18 1440)  promethazine (PHENERGAN) injection 25 mg (25 mg Intravenous Given 11/24/18 1652)    Mobility walks Low fall risk    Focused Assessments Nausea, vomiting and diarrhea.   R Recommendations: See Admitting Provider Note  Report given to: Beverlee Nims   Additional Notes:

## 2018-11-25 DIAGNOSIS — I10 Essential (primary) hypertension: Secondary | ICD-10-CM | POA: Diagnosis not present

## 2018-11-25 DIAGNOSIS — R112 Nausea with vomiting, unspecified: Secondary | ICD-10-CM | POA: Diagnosis not present

## 2018-11-25 DIAGNOSIS — E039 Hypothyroidism, unspecified: Secondary | ICD-10-CM | POA: Diagnosis not present

## 2018-11-25 LAB — CBC
HCT: 28.8 % — ABNORMAL LOW (ref 36.0–46.0)
Hemoglobin: 8.5 g/dL — ABNORMAL LOW (ref 12.0–15.0)
MCH: 27.2 pg (ref 26.0–34.0)
MCHC: 29.5 g/dL — ABNORMAL LOW (ref 30.0–36.0)
MCV: 92.3 fL (ref 80.0–100.0)
Platelets: 381 10*3/uL (ref 150–400)
RBC: 3.12 MIL/uL — ABNORMAL LOW (ref 3.87–5.11)
RDW: 17.9 % — ABNORMAL HIGH (ref 11.5–15.5)
WBC: 2.8 10*3/uL — ABNORMAL LOW (ref 4.0–10.5)
nRBC: 0 % (ref 0.0–0.2)

## 2018-11-25 LAB — COMPREHENSIVE METABOLIC PANEL
ALT: 17 U/L (ref 0–44)
AST: 17 U/L (ref 15–41)
Albumin: 3.6 g/dL (ref 3.5–5.0)
Alkaline Phosphatase: 43 U/L (ref 38–126)
Anion gap: 6 (ref 5–15)
BUN: 6 mg/dL (ref 6–20)
CO2: 26 mmol/L (ref 22–32)
CREATININE: 0.6 mg/dL (ref 0.44–1.00)
Calcium: 8.1 mg/dL — ABNORMAL LOW (ref 8.9–10.3)
Chloride: 106 mmol/L (ref 98–111)
GFR calc Af Amer: >60 mL/min (ref >60)
GFR calc non Af Amer: >60 mL/min (ref >60)
Glucose, Bld: 93 mg/dL (ref 70–99)
Potassium: 3.7 mmol/L (ref 3.5–5.1)
Sodium: 138 mmol/L (ref 135–145)
Total Bilirubin: 0.4 mg/dL (ref 0.3–1.2)
Total Protein: 6 g/dL — ABNORMAL LOW (ref 6.5–8.1)

## 2018-11-25 LAB — HIV ANTIBODY (ROUTINE TESTING W REFLEX): HIV SCREEN 4TH GENERATION: NONREACTIVE

## 2018-11-25 NOTE — Progress Notes (Signed)
PROGRESS NOTE    Paula Jacobs  QIO:[id number] DOB: 20-Aug-1961 DOA: 11/24/2018 PCP: [name], DO   Brief Narrative: Paula Jacobs is a 58 y.o. female with a history of breast cancer on chemotherapy, hypertension, hypothyroidism, anemia,  Rheumatoid arthritis, aneurysm, depression   Assessment & Plan:   Active Problems:   Hypothyroidism   Hypertension   Rheumatoid arthritis ([place])   Malignant neoplasm of upper-outer quadrant of left breast in female, estrogen receptor positive (HCC)   Intractable nausea and vomiting   Intractable nausea and vomiting Likely a side effect of chemotherapy. No emesis this morning but patient still nauseated -Advance to full liquid if tolerated -Continue Zofran IV and phenergan prn for nausea -Continue IV fluids until increased oral intake  Essential hypertension -Continue lisinopril and hydrochlorothiazide  Hypothyroidism -Continue Synthroid  Breast cancer Currently on chemotherapy. Seen by Dr. Lindi Adie.  Leukopenia New today. Unsure if related to chemotherapy. -Differential add-on  Anemia Chronic with baseline hemoglobin of around 9.5-10. Per patient, she was told to transfuse for hemoglobin less than 8. Hemoglobin of 8.5 today. No evidence of bleeding -CBC in AM    DVT prophylaxis: Lovenox Code Status:   Code Status: Full Code Family Communication: None at bedside Disposition Plan: Discharge home likely in 24-48 hours if tolerating oral diet   Consultants:   None  Procedures:   None  Antimicrobials:  None    Subjective: Nausea but overall better than yesterday. Has not yet tolerated food well. Will try some food today.  Objective: Vitals:   11/24/18 1702 11/24/18 1839 11/24/18 2227 11/25/18 0532  BP: 128/84 122/78 103/71 102/73  Pulse: 86 86 84 80  Resp: 20 18 17 16   Temp:  99 F (37.2 C) 98.3 F (36.8 C) 98.3 F (36.8 C)  TempSrc:  Oral Oral Oral  SpO2: 97% 95% 97% 92%  Weight:  85.2 kg      Height:  5\' 2"  (1.575 m)      Intake/Output Summary (Last 24 hours) at 11/25/2018 1157 Last data filed at 11/25/2018 0900 Gross per 24 hour  Intake 2015.71 ml  Output 700 ml  Net 1315.71 ml   Filed Weights   11/24/18 1328 11/24/18 1839  Weight: 84.9 kg 85.2 kg    Examination:  General exam: Appears calm and comfortable  Respiratory system: Clear to auscultation. Respiratory effort normal. Cardiovascular system: S1 & S2 heard, RRR. No murmurs, rubs, gallops or clicks. Gastrointestinal system: Abdomen is nondistended, soft and nontender. No organomegaly or masses felt. Normal bowel sounds heard. Central nervous system: Alert and oriented. No focal neurological deficits. Extremities: No edema. No calf tenderness Skin: No cyanosis. No rashes Psychiatry: Judgement and insight appear normal. Mood & affect appropriate.     Data Reviewed: I have personally reviewed following labs and imaging studies  CBC: Recent Labs  Lab 11/18/18 1111 11/20/18 0808 11/23/18 1305 11/24/18 1400 11/25/18 0550  WBC 8.0 14.0* 5.0 4.0 2.8*  NEUTROABS 3.6 8.8  --   --   --   HGB 9.4* 10.0* 9.5* 9.7* 8.5*  HCT 30.9* 32.0* 31.7* 32.9* 28.8*  MCV 89.0 87.7 90.8 91.1 92.3  PLT 329 352 339 399 324   Basic Metabolic Panel: Recent Labs  Lab 11/18/18 1111 11/20/18 0808 11/23/18 1305 11/24/18 1400 11/25/18 0550  NA 140 141 134* 139 138  K 3.2* 3.8 3.4* 3.4* 3.7  CL 107 106 101 107 106  CO2 25 24 24 23 26   GLUCOSE 126* 109* 106* 101* 93  BUN 7 7 8 7 6   CREATININE 0.58 0.75 0.57 0.55 0.60  CALCIUM 8.7* 9.0 8.6* 8.5* 8.1*   GFR: Estimated Creatinine Clearance: 79.5 mL/min (by C-G formula based on SCr of 0.6 mg/dL). Liver Function Tests: Recent Labs  Lab 11/18/18 1111 11/20/18 0808 11/23/18 1305 11/24/18 1400 11/25/18 0550  AST 16 21 17 17 17   ALT 12 15 17 18 17   ALKPHOS 67 72 56 52 43  BILITOT 0.3 0.3 0.6 0.3 0.4  PROT 6.7 7.0 7.0 6.8 6.0*  ALBUMIN 3.9 4.0 4.2 4.0 3.6   Recent Labs   Lab 11/18/18 1111 11/23/18 1305 11/24/18 1400  LIPASE 23 24 18    No results for input(s): AMMONIA in the last 168 hours. Coagulation Profile: No results for input(s): INR, PROTIME in the last 168 hours. Cardiac Enzymes: No results for input(s): CKTOTAL, CKMB, CKMBINDEX, TROPONINI in the last 168 hours. BNP (last 3 results) No results for input(s): PROBNP in the last 8760 hours. HbA1C: No results for input(s): HGBA1C in the last 72 hours. CBG: No results for input(s): GLUCAP in the last 168 hours. Lipid Profile: No results for input(s): CHOL, HDL, LDLCALC, TRIG, CHOLHDL, LDLDIRECT in the last 72 hours. Thyroid Function Tests: No results for input(s): TSH, T4TOTAL, FREET4, T3FREE, THYROIDAB in the last 72 hours. Anemia Panel: No results for input(s): VITAMINB12, FOLATE, FERRITIN, TIBC, IRON, RETICCTPCT in the last 72 hours. Sepsis Labs: No results for input(s): PROCALCITON, LATICACIDVEN in the last 168 hours.  No results found for this or any previous visit (from the past 240 hour(s)).       Radiology Studies: Abd 1 View (kub)  Result Date: 11/24/2018 CLINICAL DATA:  Chemotherapy-induced nausea and vomiting. EXAM: ABDOMEN - 1 VIEW COMPARISON:  None. FINDINGS: The bowel gas pattern is normal. No radio-opaque calculi or other significant radiographic abnormality are seen. IMPRESSION: Negative one view abdomen. Electronically Signed   By: San Morelle M.D.   On: 11/24/2018 18:18        Scheduled Meds: . benazepril  10 mg Oral Daily   And  . hydrochlorothiazide  12.5 mg Oral Daily  . enoxaparin (LOVENOX) injection  40 mg Subcutaneous Q24H  . levothyroxine  175 mcg Oral QAC breakfast   Continuous Infusions: . sodium chloride 125 mL/hr at 11/24/18 2336  . ondansetron (ZOFRAN) IV 8 mg (11/25/18 0557)     LOS: 0 days     Cordelia Poche, MD [facility] 11/25/2018, 11:57 AM  If 7PM-7AM, please contact night-coverage [email or web address]

## 2018-11-26 ENCOUNTER — Telehealth: Payer: Self-pay

## 2018-11-26 DIAGNOSIS — Z170 Estrogen receptor positive status [ER+]: Secondary | ICD-10-CM | POA: Diagnosis not present

## 2018-11-26 DIAGNOSIS — E860 Dehydration: Secondary | ICD-10-CM

## 2018-11-26 DIAGNOSIS — R112 Nausea with vomiting, unspecified: Secondary | ICD-10-CM | POA: Diagnosis not present

## 2018-11-26 DIAGNOSIS — C50412 Malignant neoplasm of upper-outer quadrant of left female breast: Secondary | ICD-10-CM | POA: Diagnosis not present

## 2018-11-26 DIAGNOSIS — T451X5A Adverse effect of antineoplastic and immunosuppressive drugs, initial encounter: Secondary | ICD-10-CM

## 2018-11-26 DIAGNOSIS — E039 Hypothyroidism, unspecified: Secondary | ICD-10-CM | POA: Diagnosis not present

## 2018-11-26 DIAGNOSIS — R197 Diarrhea, unspecified: Secondary | ICD-10-CM

## 2018-11-26 DIAGNOSIS — I10 Essential (primary) hypertension: Secondary | ICD-10-CM | POA: Diagnosis not present

## 2018-11-26 LAB — CBC
HCT: 28.7 % — ABNORMAL LOW (ref 36.0–46.0)
Hemoglobin: 8.6 g/dL — ABNORMAL LOW (ref 12.0–15.0)
MCH: 27.6 pg (ref 26.0–34.0)
MCHC: 30 g/dL (ref 30.0–36.0)
MCV: 92 fL (ref 80.0–100.0)
Platelets: 392 10*3/uL (ref 150–400)
RBC: 3.12 MIL/uL — ABNORMAL LOW (ref 3.87–5.11)
RDW: 17.6 % — AB (ref 11.5–15.5)
WBC: 2.9 10*3/uL — ABNORMAL LOW (ref 4.0–10.5)
nRBC: 0 % (ref 0.0–0.2)

## 2018-11-26 MED ORDER — ONDANSETRON HCL 8 MG PO TABS
8.0000 mg | ORAL_TABLET | Freq: Three times a day (TID) | ORAL | Status: DC | PRN
  Administered 2018-11-26: 8 mg via ORAL
  Filled 2018-11-26: qty 1

## 2018-11-26 MED ORDER — ENSURE ENLIVE PO LIQD
237.0000 mL | ORAL | Status: DC
  Administered 2018-11-26: 237 mL via ORAL

## 2018-11-26 MED ORDER — ONDANSETRON HCL 8 MG PO TABS: 8.0000 mg | ORAL_TABLET | Freq: Three times a day (TID) | ORAL | 0 refills | Status: DC | PRN

## 2018-11-26 NOTE — Progress Notes (Addendum)
[facility] INPATIENT PROGRESS NOTE  SUBJECTIVE: Paula Jacobs 58 y.o. female with Stage IIA (cT1c, cN1, cM0, G3, ER+, PR+, HER2-).  Currently receiving treatment with adjuvant weekly Taxol.  Status post 1 cycle which was given on 11/20/2018.  Patient was admitted on 11/24/2018 intractable nausea and vomiting.  The patient reports that she is feeling better today.  She was able to eat full liquids.  Thinks Zofran is helping her with her nausea.  Had one episode of diarrhea this morning.  Denies abdominal pain.  ALLERGIES:  is allergic to compazine [prochlorperazine edisylate]; hydrocodone-acetaminophen; latex; and tramadol hcl.  MEDICATIONS:  Current Facility-Administered Medications  Medication Dose Route Frequency Provider Last Rate Last Dose  . 0.9 %  sodium chloride infusion   Intravenous Continuous Swayze, Ava, DO 125 mL/hr at 11/26/18 0600    . acetaminophen (TYLENOL) tablet 650 mg  650 mg Oral Q6H PRN Swayze, Ava, DO       Or  . acetaminophen (TYLENOL) suppository 650 mg  650 mg Rectal Q6H PRN Swayze, Ava, DO      . benazepril (LOTENSIN) tablet 10 mg  10 mg Oral Daily Swayze, Ava, DO   10 mg at 11/26/18 0944   And  . hydrochlorothiazide (MICROZIDE) capsule 12.5 mg  12.5 mg Oral Daily Swayze, Ava, DO   12.5 mg at 11/26/18 0944  . diphenhydrAMINE (BENADRYL) capsule 25-50 mg  25-50 mg Oral Q6H PRN Swayze, Ava, DO      . enoxaparin (LOVENOX) injection 40 mg  40 mg Subcutaneous Q24H Swayze, Ava, DO   40 mg at 11/25/18 2211  . levothyroxine (SYNTHROID, LEVOTHROID) tablet 175 mcg  175 mcg Oral QAC breakfast Swayze, Ava, DO   175 mcg at 11/26/18 0611  . lidocaine-prilocaine (EMLA) cream 1 application  1 application Topical Daily PRN Swayze, Ava, DO      . LORazepam (ATIVAN) tablet 1 mg  1 mg Oral QHS PRN Swayze, Ava, DO      . ondansetron (ZOFRAN) 8 mg in sodium chloride 0.9 % 50 mL IVPB  8 mg Intravenous Q6H PRN Swayze, Ava, DO 216 mL/hr at 11/26/18 0945 8 mg at 11/26/18 0945   . polyethylene glycol (MIRALAX / GLYCOLAX) packet 17 g  17 g Oral Daily PRN Swayze, Ava, DO      . promethazine (PHENERGAN) tablet 25 mg  25 mg Oral Q6H PRN Swayze, Ava, DO   25 mg at 11/25/18 1659  . sorbitol 70 % solution 30 mL  30 mL Oral Daily PRN Swayze, Ava, DO       Facility-Administered Medications Ordered in Other Encounters  Medication Dose Route Frequency Provider Last Rate Last Dose  . heparin lock flush 100 unit/mL  500 Units Intracatheter Once PRN Nicholas Lose, MD      . sodium chloride flush (NS) 0.9 % injection 10 mL  10 mL Intracatheter PRN Nicholas Lose, MD        REVIEW OF SYSTEMS:   Review of Systems  Constitutional: Negative.   HENT: Negative.   Eyes: Negative.   Respiratory: Negative.   Cardiovascular: Negative.   Gastrointestinal: Positive for diarrhea, nausea and vomiting.       Had nausea and vomiting to admission.  Improved now.  Using Zofran.  Able to eat.  One episode of diarrhea this morning.  Genitourinary: Negative.   Musculoskeletal: Negative.   Skin: Negative.   Neurological: Negative.   Endo/Heme/Allergies: Negative.   Psychiatric/Behavioral: Negative.       PHYSICAL  EXAMINATION:  Vital signs in last 24 hours: Temp:  [98.2 F (36.8 C)-98.7 F (37.1 C)] 98.2 F (36.8 C) (03/09 0458) Pulse Rate:  [74-91] 74 (03/09 0458) Resp:  [14-16] 14 (03/09 0458) BP: (102-122)/(62-77) 122/77 (03/09 0458) SpO2:  [92 %-95 %] 95 % (03/09 0458) Weight change:  Last BM Date: 11/25/18  ECOG PERFORMANCE STATUS: 1 - Symptomatic but completely ambulatory  Intake/Output from previous day: 03/08 0701 - 03/09 0700 In: 3959.9 [P.O.:1200; I.V.:2597.9; IV Piggyback:162] Out: 700 [Urine:700] General: Alert, awake without distress. Head: Normocephalic atraumatic. Mouth: mucous membranes moist, pharynx normal without lesions Eyes: No scleral icterus.  Pupils are equal and round reactive to light. Resp: clear to auscultation bilaterally without rhonchi or wheezes  or dullness to percussion. Cardio: regular rate and rhythm, S1, S2 normal, no murmur, click, rub or gallop GI: soft, non-tender; bowel sounds normal; no masses,  no organomegaly Musculoskeletal: No joint deformity or effusion. Neurological: No motor, sensory deficits.  Intact deep tendon reflexes. Skin: No rashes or lesions.  Portacath/PICC-without erythema  LABORATORY DATA: Lab Results  Component Value Date   WBC 2.9 (L) 11/26/2018   HGB 8.6 (L) 11/26/2018   HCT 28.7 (L) 11/26/2018   MCV 92.0 11/26/2018   PLT 392 11/26/2018    CMP Latest Ref Rng & Units 11/25/2018 11/24/2018 11/23/2018  Glucose 70 - 99 mg/dL 93 101(H) 106(H)  BUN 6 - 20 mg/dL '6 7 8  '$ Creatinine 0.44 - 1.00 mg/dL 0.60 0.55 0.57  Sodium 135 - 145 mmol/L 138 139 134(L)  Potassium 3.5 - 5.1 mmol/L 3.7 3.4(L) 3.4(L)  Chloride 98 - 111 mmol/L 106 107 101  CO2 22 - 32 mmol/L '26 23 24  '$ Calcium 8.9 - 10.3 mg/dL 8.1(L) 8.5(L) 8.6(L)  Total Protein 6.5 - 8.1 g/dL 6.0(L) 6.8 7.0  Total Bilirubin 0.3 - 1.2 mg/dL 0.4 0.3 0.6  Alkaline Phos 38 - 126 U/L 43 52 56  AST 15 - 41 U/L '17 17 17  '$ ALT 0 - 44 U/L '17 18 17     '$ RADIOGRAPHIC STUDIES:  Abd 1 View (kub)  Result Date: 11/24/2018 CLINICAL DATA:  Chemotherapy-induced nausea and vomiting. EXAM: ABDOMEN - 1 VIEW COMPARISON:  None. FINDINGS: The bowel gas pattern is normal. No radio-opaque calculi or other significant radiographic abnormality are seen. IMPRESSION: Negative one view abdomen. Electronically Signed   By: San Morelle M.D.   On: 11/24/2018 18:18     ASSESSMENT/PLAN:  08/21/2018:Bilateral mastectomies: Left mastectomy: IDC grade 3, 2.1 cm, high-grade DCIS, margins negative, negative for LV I, 3/9 lymph nodes positive, ER 95% positive, PR 90% positive, HER-2 -1+, Ki-67 30% T2N1A stage IIa right mastectomy: Benign MammaPrint: High risk luminal type B Echocardiogram 09/18/2018: EF 55 to 60% Patient has been enrolled and upbeat clinical trial: No adverse effects  related to the trial  Treatment plan: 1.Adjuvant chemotherapy with dose dense Adriamycin and Cytoxan x4 followed by weekly Taxol x12 2.Followed by adjuvant antiestrogen therapy ------------------------------------------------------------------------------------------------------------------ Current Treatment:Weekly Taxol.  Status post 1 cycle.  First dose given on 11/20/2018. Chemo Toxicities: 1.Nausea: Mild to moderate, controlled with anti emetics 2.Severe fatigue:reviewed brief periods of exercise 3. Left arm swelling: better 4.  Skin discoloration of hands and feet   The patient was admitted for intractable nausea and vomiting.  She states she had the symptoms prior to receiving her Taxol last week.  She is feeling much better with Zofran.  She is able to eat.  The patient may be discharged later today or early tomorrow  if she is able to tolerate her diet.  Recommend that she received a prescription for ondansetron 8 mg every 8 hours as needed for nausea and vomiting upon discharge.  She is due for chemo again on 11/27/2018.  This will likely need to be delayed due to her hospital admission.   LOS: 0 days   Mikey Bussing, DNP, AGPCNP-BC, AOCNP 11/26/18   Attending Note  I personally saw the patient, reviewed the chart and examined the patient. The plan of care was discussed with the patient  I agree with the assessment and plan as documented above. Chemo-induced nausea and vomiting: Significant improvement. We will plan to hold off on chemotherapy tomorrow. I will see her back next week for continuation of chemo. Thank you very much for taking care of her.

## 2018-11-26 NOTE — Progress Notes (Signed)
Patient discharged to home, all discharge medications and instructions reviewed and questions answered.  Patient to be assisted to vehicle by wheelchair.  

## 2018-11-26 NOTE — Discharge Summary (Signed)
Physician Discharge Summary  Paula Jacobs ZLD:[id number] DOB: [birth year] DOA: 11/24/2018  PCP: [name], DO  Admit date: 11/24/2018 Discharge date: 11/26/2018  Admitted From: Home Disposition: Home  Recommendations for Outpatient Follow-up:  1. Follow up with PCP in 1 week 2. Follow up with oncology 3. Please obtain BMP/CBC in one week 4. Please follow up on the following pending results: None  Home Health: None Equipment/Devices: None  Discharge Condition: Stable CODE STATUS: Full code Diet recommendation: Heart healthy   Brief/Interim Summary:  Admission HPI written by Karie Kirks, DO   HPI: The patient is a 58 yr old woman who is undergoing chemotherapy for breast cancer. She states that she was recently changed to Taxol and was told that it should not make her sick. However the patient state that she thinks that it has made her sicker. In fact she states that the current illness started last Sunday prior to her last chemo on Tuesday. She states that she has had nausea, vomiting and diarrhea. She states that she thought that her diarrhea had stopped, but that she had a loose BM this morning. The patient states that she cannot keep anything down. She denies fever, chills, or rigors. She denies abdominal pain.  The patient carries a past medical history significant for Breast cancer, hypertension, hypothyroidism, anemia,  Rheumatoid arthritis, aneurysm, depression.  Upon arrival at the ED the patient's temperature was 98.6. Her respiratory rate was 17. Her heart rate was 96. Blood pressure was 142/90. She was saturating 95% on room air.   Hospital course:  Intractable nausea and vomiting Likely a side effect of chemotherapy. Emesis resolved with IV Zofran. Patient tolerated increased diet. Discharge with Zofran PO prescription.  Essential hypertension Continue lisinopril and hydrochlorothiazide  Hypothyroidism Continue Synthroid  Breast cancer Currently  on chemotherapy. Seen by Dr. Lindi Adie.  Leukopenia Unsure if related to chemotherapy. Afebrile. Stable.  Anemia Chronic with baseline hemoglobin of around 9.5-10. Per patient, she was told to transfuse for hemoglobin less than 8. Hemoglobin stable. No evidence of bleeding  Discharge Diagnoses:  Active Problems:   Hypothyroidism   Hypertension   Rheumatoid arthritis ([place])   Malignant neoplasm of upper-outer quadrant of left breast in female, estrogen receptor positive (HCC)   Intractable nausea and vomiting    Discharge Instructions   Allergies as of 11/26/2018      Reactions   Compazine [prochlorperazine Edisylate] Anxiety   Hydrocodone-acetaminophen Itching   Latex Itching   Tramadol Hcl Itching, Nausea Only      Medication List    TAKE these medications   acetaminophen 500 MG tablet Commonly known as:  TYLENOL Take 1,000 mg by mouth every 6 (six) hours as needed for moderate pain.   benazepril-hydrochlorthiazide 10-12.5 MG tablet Commonly known as:  LOTENSIN HCT TAKE 1 TABLET BY MOUTH DAILY   diphenhydrAMINE 25 MG tablet Commonly known as:  BENADRYL Take 25-50 mg by mouth every 6 (six) hours as needed for allergies.   levothyroxine 175 MCG tablet Commonly known as:  SYNTHROID, LEVOTHROID TAKE 1 TABLET BY MOUTH EVERY MORNING What changed:  when to take this   lidocaine-prilocaine cream Commonly known as:  EMLA Apply to affected area once What changed:    how much to take  how to take this  when to take this  reasons to take this   LORazepam 1 MG tablet Commonly known as:  Ativan Take 1 tablet (1 mg total) by mouth at bedtime as needed for sleep.  ondansetron 8 MG tablet Commonly known as:  ZOFRAN Take 1 tablet (8 mg total) by mouth every 8 (eight) hours as needed for nausea or vomiting. What changed:    when to take this  reasons to take this  additional instructions   prochlorperazine 10 MG tablet Commonly known as:  COMPAZINE Take 1  tablet (10 mg total) by mouth every 6 (six) hours as needed (Nausea or vomiting).   promethazine 25 MG tablet Commonly known as:  PHENERGAN Take 1 tablet (25 mg total) by mouth every 6 (six) hours as needed for up to 3 days for nausea or vomiting.       Allergies  Allergen Reactions  . Compazine [Prochlorperazine Edisylate] Anxiety  . Hydrocodone-Acetaminophen Itching  . Latex Itching  . Tramadol Hcl Itching and Nausea Only    Consultations:  None   Procedures/Studies: Abd 1 View (kub)  Result Date: 11/24/2018 CLINICAL DATA:  Chemotherapy-induced nausea and vomiting. EXAM: ABDOMEN - 1 VIEW COMPARISON:  None. FINDINGS: The bowel gas pattern is normal. No radio-opaque calculi or other significant radiographic abnormality are seen. IMPRESSION: Negative one view abdomen. Electronically Signed   By: San Morelle M.D.   On: 11/24/2018 18:18      Subjective: No nausea or vomiting. Had a loose bowel movement today.  Discharge Exam: Vitals:   11/26/18 0458 11/26/18 1226  BP: 122/77 120/73  Pulse: 74 89  Resp: 14 16  Temp: 98.2 F (36.8 C) 98.5 F (36.9 C)  SpO2: 95% 98%   Vitals:   11/25/18 1248 11/25/18 2028 11/26/18 0458 11/26/18 1226  BP: 102/62 110/69 122/77 120/73  Pulse: 91 87 74 89  Resp: 16 16 14 16   Temp: 98.4 F (36.9 C) 98.7 F (37.1 C) 98.2 F (36.8 C) 98.5 F (36.9 C)  TempSrc: Oral Oral Oral Oral  SpO2: 92% 93% 95% 98%  Weight:      Height:        General: Pt is alert, awake, not in acute distress Cardiovascular: RRR, S1/S2 +, no rubs, no gallops Respiratory: CTA bilaterally, no wheezing, no rhonchi Abdominal: Soft, NT, ND, bowel sounds + Extremities: no edema, no cyanosis    The results of significant diagnostics from this hospitalization (including imaging, microbiology, ancillary and laboratory) are listed below for reference.     Microbiology: No results found for this or any previous visit (from the past 240 hour(s)).    Labs: BNP (last 3 results) No results for input(s): BNP in the last 8760 hours. Basic Metabolic Panel: Recent Labs  Lab 11/20/18 0808 11/23/18 1305 11/24/18 1400 11/25/18 0550  NA 141 134* 139 138  K 3.8 3.4* 3.4* 3.7  CL 106 101 107 106  CO2 24 24 23 26   GLUCOSE 109* 106* 101* 93  BUN 7 8 7 6   CREATININE 0.75 0.57 0.55 0.60  CALCIUM 9.0 8.6* 8.5* 8.1*   Liver Function Tests: Recent Labs  Lab 11/20/18 0808 11/23/18 1305 11/24/18 1400 11/25/18 0550  AST 21 17 17 17   ALT 15 17 18 17   ALKPHOS 72 56 52 43  BILITOT 0.3 0.6 0.3 0.4  PROT 7.0 7.0 6.8 6.0*  ALBUMIN 4.0 4.2 4.0 3.6   Recent Labs  Lab 11/23/18 1305 11/24/18 1400  LIPASE 24 18   No results for input(s): AMMONIA in the last 168 hours. CBC: Recent Labs  Lab 11/20/18 0808 11/23/18 1305 11/24/18 1400 11/25/18 0550 11/26/18 0617  WBC 14.0* 5.0 4.0 2.8* 2.9*  NEUTROABS 8.8  --   --   --   --  HGB 10.0* 9.5* 9.7* 8.5* 8.6*  HCT 32.0* 31.7* 32.9* 28.8* 28.7*  MCV 87.7 90.8 91.1 92.3 92.0  PLT 352 339 399 381 392   Cardiac Enzymes: No results for input(s): CKTOTAL, CKMB, CKMBINDEX, TROPONINI in the last 168 hours. BNP: Invalid input(s): POCBNP CBG: No results for input(s): GLUCAP in the last 168 hours. D-Dimer No results for input(s): DDIMER in the last 72 hours. Hgb A1c No results for input(s): HGBA1C in the last 72 hours. Lipid Profile No results for input(s): CHOL, HDL, LDLCALC, TRIG, CHOLHDL, LDLDIRECT in the last 72 hours. Thyroid function studies No results for input(s): TSH, T4TOTAL, T3FREE, THYROIDAB in the last 72 hours.  Invalid input(s): FREET3 Anemia work up No results for input(s): VITAMINB12, FOLATE, FERRITIN, TIBC, IRON, RETICCTPCT in the last 72 hours. Urinalysis    Component Value Date/Time   COLORURINE STRAW (A) 11/24/2018 1327   APPEARANCEUR CLEAR 11/24/2018 1327   LABSPEC 1.011 11/24/2018 1327   PHURINE 6.0 11/24/2018 1327   GLUCOSEU NEGATIVE 11/24/2018 1327    HGBUR NEGATIVE 11/24/2018 1327   HGBUR negative 06/29/2009 1420   BILIRUBINUR NEGATIVE 11/24/2018 1327   BILIRUBINUR n 01/26/2012 1532   KETONESUR 5 (A) 11/24/2018 1327   PROTEINUR NEGATIVE 11/24/2018 1327   UROBILINOGEN 0.2 10/15/2014 0935   NITRITE NEGATIVE 11/24/2018 1327   LEUKOCYTESUR NEGATIVE 11/24/2018 1327     SIGNED:   Cordelia Poche, MD [facility] 11/26/2018, 1:32 PM

## 2018-11-26 NOTE — Telephone Encounter (Signed)
Spoke with patient.  Patient admitted for severe nausea and vomiting.  Pt questioning what she should do with appointments scheduled for 11/27/2018.  Nurse informed patient MD will review and see inpatient today.   Nurse informed MD about admission. MD will see patient today while in hospital.

## 2018-11-26 NOTE — Progress Notes (Addendum)
Initial Nutrition Assessment  DOCUMENTATION CODES:   Obesity unspecified  INTERVENTION:   -Provide Ensure Enlive po daily, each supplement provides 350 kcal and 20 grams of protein -Educated pt on strategies to help with taste changes  NUTRITION DIAGNOSIS:   Increased nutrient needs related to cancer and cancer related treatments as evidenced by estimated needs.  GOAL:   Patient will meet greater than or equal to 90% of their needs  MONITOR:   PO intake, Supplement acceptance, Labs, Weight trends, I & O's  REASON FOR ASSESSMENT:   Malnutrition Screening Tool   ASSESSMENT:   58 y.o. female with a history of breast cancer on chemotherapy, hypertension, hypothyroidism, anemia,  Rheumatoid arthritis, aneurysm, depression. Admitted for intractable N/V.   Patient reports developing N/V on 3/1, she came to ED that day and was discharged but states she never got better. She was unable to tolerate any PO intake including water for a week. Today is the first day she states she has not felt nausea, relates this to Zofran.    Pt reports having taste changes related to chemotherapy. Reports that foods taste bland. Reviewed strategies to help with taste changes.  Pt currently tolerating full liquids and pt states MD is going to let her try a solid diet today and she may d/c later.  Reviewed foods to try when nauseous. Encouraged pt to consume at least 1 protein shake daily given recent weight loss. Will order Ensure today to try.  Per weight records, pt has lost 18 lb since 03/20/18 (8% wt loss x 8 months, insignificant for time frame).  Labs reviewed. Medications: IV Zofran PRN, Phenergan tablet PRN  NUTRITION - FOCUSED PHYSICAL EXAM:    Most Recent Value  Orbital Region  No depletion  Upper Arm Region  No depletion  Thoracic and Lumbar Region  Unable to assess  Buccal Region  No depletion  Temple Region  Mild depletion  Clavicle Bone Region  No depletion  Clavicle and Acromion  Bone Region  No depletion  Scapular Bone Region  No depletion  Dorsal Hand  No depletion  Patellar Region  No depletion  Anterior Thigh Region  No depletion  Posterior Calf Region  No depletion  Edema (RD Assessment)  None  Hair  Reviewed [No hair]  Eyes  Reviewed  Mouth  Reviewed  Skin  Reviewed  Nails  Reviewed       Diet Order:   Diet Order            Diet full liquid Room service appropriate? Yes; Fluid consistency: Thin  Diet effective now              EDUCATION NEEDS:   Education needs have been addressed  Skin:  Skin Assessment: Reviewed RN Assessment  Last BM:  3/8  Height:   Ht Readings from Last 1 Encounters:  11/24/18 5\' 2"  (1.575 m)    Weight:   Wt Readings from Last 1 Encounters:  11/24/18 85.2 kg    Ideal Body Weight:  50 kg  BMI:  Body mass index is 34.35 kg/m.  Estimated Nutritional Needs:   Kcal:  1800-2000  Protein:  75-85g  Fluid:  2L/day   Clayton Bibles, MS, RD, LDN [place] Dietitian Pager: [phone] After Hours Pager: [phone]

## 2018-11-26 NOTE — Discharge Instructions (Addendum)
Paula Jacobs,  You were in the hospital for chemotherapy induced nausea/vomiting. This was treated with IV Zofran initially with great improvement in symptoms. You will be discharged with oral Zofran 8 mg as needed. Please follow-up with your PCP and your oncologist.

## 2018-11-27 ENCOUNTER — Inpatient Hospital Stay: Payer: BLUE CROSS/BLUE SHIELD

## 2018-11-27 ENCOUNTER — Ambulatory Visit: Payer: BLUE CROSS/BLUE SHIELD

## 2018-11-27 ENCOUNTER — Inpatient Hospital Stay: Payer: BLUE CROSS/BLUE SHIELD | Admitting: Hematology and Oncology

## 2018-11-28 ENCOUNTER — Inpatient Hospital Stay (HOSPITAL_BASED_OUTPATIENT_CLINIC_OR_DEPARTMENT_OTHER): Payer: BLUE CROSS/BLUE SHIELD | Admitting: Hematology and Oncology

## 2018-11-28 ENCOUNTER — Other Ambulatory Visit: Payer: Self-pay

## 2018-11-28 ENCOUNTER — Telehealth: Payer: Self-pay

## 2018-11-28 DIAGNOSIS — Z170 Estrogen receptor positive status [ER+]: Secondary | ICD-10-CM

## 2018-11-28 DIAGNOSIS — Z5111 Encounter for antineoplastic chemotherapy: Secondary | ICD-10-CM | POA: Diagnosis not present

## 2018-11-28 DIAGNOSIS — R5383 Other fatigue: Secondary | ICD-10-CM

## 2018-11-28 DIAGNOSIS — Z79899 Other long term (current) drug therapy: Secondary | ICD-10-CM

## 2018-11-28 DIAGNOSIS — C50412 Malignant neoplasm of upper-outer quadrant of left female breast: Secondary | ICD-10-CM

## 2018-11-28 DIAGNOSIS — R110 Nausea: Secondary | ICD-10-CM

## 2018-11-28 DIAGNOSIS — Z9013 Acquired absence of bilateral breasts and nipples: Secondary | ICD-10-CM | POA: Diagnosis not present

## 2018-11-28 DIAGNOSIS — R630 Anorexia: Secondary | ICD-10-CM

## 2018-11-28 DIAGNOSIS — R197 Diarrhea, unspecified: Secondary | ICD-10-CM | POA: Diagnosis not present

## 2018-11-28 DIAGNOSIS — K2970 Gastritis, unspecified, without bleeding: Secondary | ICD-10-CM | POA: Diagnosis not present

## 2018-11-28 MED ORDER — DEXAMETHASONE 1 MG PO TABS: 1.0000 mg | ORAL_TABLET | Freq: Every day | ORAL | 0 refills | Status: DC

## 2018-11-28 NOTE — Telephone Encounter (Signed)
Pt called with reports of severe diarrhea, lack of appetite and continued weight loss. Pt just got discharged from hospital on Monday this week for n/v/d. Pt has not eaten any solid food since. She tries to keep herself hydrated and to nourish with ensure, but continues to have diarrhea. Suggested that pt try to take imodium to see if symptoms improve, since she hasn't taken anything to stop diarrhea as of now. Pt denies any vomiting, or fevers. Pt to be seen at the office today to discuss symptoms/chemo treatment with Dr.Gudena. Confirmed time/date.

## 2018-11-28 NOTE — Assessment & Plan Note (Signed)
08/21/2018:Bilateral mastectomies: Left mastectomy: IDC grade 3, 2.1 cm, high-grade DCIS, margins negative, negative for LV I, 3/9 lymph nodes positive, ER 95% positive, PR 90% positive, HER-2 -1+, Ki-67 30% T2N1A stage IIa right mastectomy: Benign MammaPrint: High risk luminal type B Echocardiogram 09/18/2018: EF 55 to 60% Patient has been enrolled and upbeat clinical trial: No adverse effects related to the trial  Treatment plan: 1.Adjuvant chemotherapy with dose dense Adriamycin and Cytoxan x4 followed by weekly Taxol x12 2.Followed by adjuvant antiestrogen therapy ------------------------------------------------------------------------------------------------------------------ Current Treatment:Completed 4 cycles of dose dense Adriamycin and Cytoxan, received cycle 1 Taxol Chemo Toxicities: 1.Nausea: Very severe and is better controlled on scheduled doses of Zofran 3 times a day.  2.Severe fatigue:reviewed brief periods of exercise 3.  Extremely poor appetite: I give a prescription for dexamethasone 1 mg daily. 4.    Diarrhea: 4-5 loose stools per day: I gave her instructions on how to take Imodium.  It appears that the patient is not keen on continuation of further chemotherapy.  We will discuss this when she comes back to see [place] next Tuesday. She is able to drink water and keep fluids down.  She is unable to eat much of any solid food.  I instructed her not to take Ensure because it could be causing diarrhea

## 2018-11-28 NOTE — Progress Notes (Signed)
Patient Care Team: [name], DO as PCP - General (Family Medicine) Hurley Cisco, MD as Consulting Physician (Rheumatology) [name], MD as Consulting Physician (Orthopedic Surgery) Erroll Luna, MD as Consulting Physician (General Surgery) Nicholas Lose, MD as Consulting Physician (Hematology and Oncology) Kyung Rudd, MD as Consulting Physician (Radiation Oncology) Maxwell Marion, RN as Registered Nurse (Medical Oncology)  DIAGNOSIS:    ICD-10-CM   1. Malignant neoplasm of upper-outer quadrant of left breast in female, estrogen receptor positive ([place]) C50.412    Z17.0     SUMMARY OF ONCOLOGIC HISTORY:   Malignant neoplasm of upper-outer quadrant of left breast in female, estrogen receptor positive ([place])   07/13/2018 Initial Diagnosis     Palpable left breast mass, 2 adjacent masses at 2 o'clock position 2 cm biopsy IDC grade 2-3, Ig DCIS, ER 95%, PR 90%, Ki-67 30%, HER-2 -1+; 2:30 position intramammary lymph node 1.3 cm biopsy positive; left axillary lymph node biopsy negative discordant, T2N1, stage IIa    07/25/2018 Cancer Staging    Staging form: Breast, AJCC 8th Edition - Clinical: Stage IIA (cT1c, cN1, cM0, G3, ER+, PR+, HER2-) - Signed by Nicholas Lose, MD on 07/25/2018    08/21/2018 Surgery    Bilateral mastectomies: Left mastectomy: IDC grade 3, 2.1 cm, high-grade DCIS, margins negative, negative for LV I, 3/9 lymph nodes positive, ER 95% positive, PR 90% positive, HER-2 -1+, Ki-67 30% T2N1A stage IIa right mastectomy: Benign    09/03/2018 Cancer Staging    Staging form: Breast, AJCC 8th Edition - Pathologic: Stage IIA (pT2, pN1a, cM0, G3, ER+, PR+, HER2-) - Signed by Nicholas Lose, MD on 09/03/2018    09/03/2018 Miscellaneous    MammaPrint: High risk luminal type B, average 10-year risk of recurrence untreated 29%, predicted 5-year benefit of chemotherapy 94.6% distant metastasis free interval    09/26/2018 -  Chemotherapy    Adjuvant chemotherapy  with dose dense Adriamycin and Cytoxan x4 followed by weekly Taxol x12     CHIEF COMPLIANT: Follow-up after recent hospitalization   INTERVAL HISTORY: Paula Jacobs is a 58 y.o. with above-mentioned history of left breast cancerwhounderwent bilateral mastectomiesand completed 4 cycles of dose dense Adriamycin and Cytoxan and is currently on weekly Taxol treatments. She presented to the ED on 11/24/18 for severe nausea, vomiting, and diarrhea and was admitted for IV fluids and discharged on 11/26/18. She presents to the clinic today and notes 4 episodes of diarrhea yesterday and 3 episodes today, fatigue, lack of appetite and weight loss. She denies vomiting. She has been drinking a lot of water and Ensure, and has not been able to eat due to stomach pain and looking at food makes her nauseous. She is anxious and scared that she can't eat and is missing treatment.   REVIEW OF SYSTEMS:   Constitutional: Denies fevers, chills (+) abnormal weight loss (+) loss of appetite (+) fatigue Eyes: Denies blurriness of vision Ears, nose, mouth, throat, and face: Denies mucositis or sore throat Respiratory: Denies cough, dyspnea or wheezes Cardiovascular: Denies palpitation, chest discomfort Gastrointestinal: Denies heartburn (+) diarrhea (+) nausea (+) stomach pain Skin: Denies abnormal skin rashes Lymphatics: Denies new lymphadenopathy or easy bruising Neurological: Denies numbness, tingling or new weaknesses Behavioral/Psych: Mood is stable, no new changes (+) anxiety Extremities: No lower extremity edema Breast: denies any pain or lumps or nodules in either breasts All other systems were reviewed with the patient and are negative.  I have reviewed the past medical history, past surgical history,  social history and family history with the patient and they are unchanged from previous note.  ALLERGIES:  is allergic to compazine [prochlorperazine edisylate]; hydrocodone-acetaminophen; latex; and  tramadol hcl.  MEDICATIONS:  Current Outpatient Medications  Medication Sig Dispense Refill  . acetaminophen (TYLENOL) 500 MG tablet Take 1,000 mg by mouth every 6 (six) hours as needed for moderate pain.    . benazepril-hydrochlorthiazide (LOTENSIN HCT) 10-12.5 MG tablet TAKE 1 TABLET BY MOUTH DAILY 90 tablet 0  . dexamethasone (DECADRON) 1 MG tablet Take 1 tablet (1 mg total) by mouth daily. 30 tablet 0  . diphenhydrAMINE (BENADRYL) 25 MG tablet Take 25-50 mg by mouth every 6 (six) hours as needed for allergies.    Marland Kitchen levothyroxine (SYNTHROID, LEVOTHROID) 175 MCG tablet TAKE 1 TABLET BY MOUTH EVERY MORNING (Patient taking differently: Take 175 mcg by mouth daily before breakfast. ) 30 tablet 5  . lidocaine-prilocaine (EMLA) cream Apply to affected area once (Patient taking differently: Apply 1 application topically daily as needed (access port). Apply to affected area once) 30 g 3  . LORazepam (ATIVAN) 1 MG tablet Take 1 tablet (1 mg total) by mouth at bedtime as needed for sleep. (Patient not taking: Reported on 11/24/2018) 30 tablet 3  . ondansetron (ZOFRAN) 8 MG tablet Take 1 tablet (8 mg total) by mouth every 8 (eight) hours as needed for nausea or vomiting. 30 tablet 0  . prochlorperazine (COMPAZINE) 10 MG tablet Take 1 tablet (10 mg total) by mouth every 6 (six) hours as needed (Nausea or vomiting). (Patient not taking: Reported on 11/18/2018) 30 tablet 1  . promethazine (PHENERGAN) 25 MG tablet Take 1 tablet (25 mg total) by mouth every 6 (six) hours as needed for up to 3 days for nausea or vomiting. 12 tablet 0   No current facility-administered medications for this visit.    Facility-Administered Medications Ordered in Other Visits  Medication Dose Route Frequency Provider Last Rate Last Dose  . heparin lock flush 100 unit/mL  500 Units Intracatheter Once PRN Nicholas Lose, MD      . sodium chloride flush (NS) 0.9 % injection 10 mL  10 mL Intracatheter PRN Nicholas Lose, MD         PHYSICAL EXAMINATION: ECOG PERFORMANCE STATUS: 1 - Symptomatic but completely ambulatory  Vitals:   11/28/18 1451  BP: 109/79  Pulse: 88  Resp: 18  Temp: 98.4 F (36.9 C)  SpO2: 98%   Filed Weights   11/28/18 1451  Weight: 185 lb 14.4 oz (84.3 kg)    GENERAL: alert, no distress and comfortable SKIN: skin color, texture, turgor are normal, no rashes or significant lesions EYES: normal, Conjunctiva are pink and non-injected, sclera clear OROPHARYNX: no exudate, no erythema and lips, buccal mucosa, and tongue normal  NECK: supple, thyroid normal size, non-tender, without nodularity LYMPH: no palpable lymphadenopathy in the cervical, axillary or inguinal LUNGS: clear to auscultation and percussion with normal breathing effort HEART: regular rate & rhythm and no murmurs and no lower extremity edema ABDOMEN: abdomen soft, non-tender and normal bowel sounds MUSCULOSKELETAL: no cyanosis of digits and no clubbing  NEURO: alert & oriented x 3 with fluent speech, no focal motor/sensory deficits EXTREMITIES: No lower extremity edema  LABORATORY DATA:  I have reviewed the data as listed CMP Latest Ref Rng & Units 11/25/2018 11/24/2018 11/23/2018  Glucose 70 - 99 mg/dL 93 101(H) 106(H)  BUN 6 - 20 mg/dL '6 7 8  '$ Creatinine 0.44 - 1.00 mg/dL 0.60 0.55 0.57  Sodium  135 - 145 mmol/L 138 139 134(L)  Potassium 3.5 - 5.1 mmol/L 3.7 3.4(L) 3.4(L)  Chloride 98 - 111 mmol/L 106 107 101  CO2 22 - 32 mmol/L '26 23 24  '$ Calcium 8.9 - 10.3 mg/dL 8.1(L) 8.5(L) 8.6(L)  Total Protein 6.5 - 8.1 g/dL 6.0(L) 6.8 7.0  Total Bilirubin 0.3 - 1.2 mg/dL 0.4 0.3 0.6  Alkaline Phos 38 - 126 U/L 43 52 56  AST 15 - 41 U/L '17 17 17  '$ ALT 0 - 44 U/L '17 18 17    '$ Lab Results  Component Value Date   WBC 2.9 (L) 11/26/2018   HGB 8.6 (L) 11/26/2018   HCT 28.7 (L) 11/26/2018   MCV 92.0 11/26/2018   PLT 392 11/26/2018   NEUTROABS 8.8 11/20/2018    ASSESSMENT & PLAN:  Malignant neoplasm of upper-outer quadrant of left  breast in female, estrogen receptor positive ([place]) 08/21/2018:Bilateral mastectomies: Left mastectomy: IDC grade 3, 2.1 cm, high-grade DCIS, margins negative, negative for LV I, 3/9 lymph nodes positive, ER 95% positive, PR 90% positive, HER-2 -1+, Ki-67 30% T2N1A stage IIa right mastectomy: Benign MammaPrint: High risk luminal type B Echocardiogram 09/18/2018: EF 55 to 60% Patient has been enrolled and upbeat clinical trial: No adverse effects related to the trial  Treatment plan: 1.Adjuvant chemotherapy with dose dense Adriamycin and Cytoxan x4 followed by weekly Taxol x12 2.Followed by adjuvant antiestrogen therapy ------------------------------------------------------------------------------------------------------------------ Current Treatment:Completed 4 cycles of dose dense Adriamycin and Cytoxan, received cycle 1 Taxol Chemo Toxicities: 1.Nausea: Very severe and is better controlled on scheduled doses of Zofran 3 times a day.  2.Severe fatigue:reviewed brief periods of exercise 3.  Extremely poor appetite: I give a prescription for dexamethasone 1 mg daily. 4.    Diarrhea: 4-5 loose stools per day: I gave her instructions on how to take Imodium.  It appears that the patient is not keen on continuation of further chemotherapy.  We will discuss this when she comes back to see [place] next Tuesday. She is able to drink water and keep fluids down.  She is unable to eat much of any solid food.  I instructed her not to take Ensure because it could be causing diarrhea    No orders of the defined types were placed in this encounter.  The patient has a good understanding of the overall plan. she agrees with it. she will call with any problems that may develop before the next visit here.  Nicholas Lose, MD 11/28/2018  Julious Oka Dorshimer am acting as scribe for Dr. Nicholas Lose.  I have reviewed the above documentation for accuracy and completeness, and I agree with the above.

## 2018-11-30 NOTE — ED Provider Notes (Signed)
[facility] DEPT Provider Note   CSN: [id number] Arrival date & time: 11/23/18  1122    History   Chief Complaint Chief Complaint  Patient presents with  . Diarrhea    HPI Paula Jacobs is a 58 y.o. female.     HPI   58 year old female with diarrhea.  Recently seen for the same and was also having nausea and vomiting at that time which has since improved.  No fevers.  Some crampy abdominal pain that comes and goes.  Recently had chemo.  She is advised to try Imodium but has yet to do so.  She states that every time she eats she immediately has diarrhea.  No blood in her stool.  No sick contacts.  Past Medical History:  Diagnosis Date  . ANEMIA-NOS 04/23/2008  . Aneurysm ([place]) 10/19/2017   Evaluated by [facility] 10/2017, 68mm, stable  . Anginal pain ([place])    went to ED in april 2018 c/o chest pain over last 2 months ; had EKG, CXR  and troponin negative per physician suspected musculoskeletal  ; dc'd with ibuprofen  and recc f/u with outpatient stress test; see care everywhere ED visit  ; patient denies recurrence of Chest pain since, endorses occ palpitations   . DEPRESSION 04/23/2008  . DJD (degenerative joint disease)   . Family history of breast cancer   . Hyperplastic colon polyp    x2  . Hypertension   . HYPOTHYROIDISM 10/17/2007  . Osteoarthritis   . Palpitations    freq at night;  at pre-op states she drinks caffeine and that makes it worse; says the palpitations started after they took her thyroid   . PAT 10/27/2009   Qualifier: Diagnosis of  By: [name], MD, [name]    . Pneumonia    "in the past, havent had it in years"  . PONV (postoperative nausea and vomiting)    only after 1979 surgery  . RA (rheumatoid arthritis) ([place])    "problems in feet, hands, and knees"  . 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 SVT (supraventricular tachycardia) ([facility])    chronic   . Tubulovillous adenoma of colon     Patient Active Problem List    Diagnosis Date Noted  . Intractable nausea and vomiting 11/24/2018  . Port-A-Cath in place 09/26/2018  . Breast cancer, left ([place]) 08/22/2018  . Breast cancer, stage 2, left ([place]) 08/21/2018  . Genetic testing 08/08/2018  . Family history of breast cancer   . Malignant neoplasm of upper-outer quadrant of left breast in female, estrogen receptor positive ([place]) 07/19/2018  . 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 Aneurysm ([place]) 10/19/2017  . Morbid obesity ([place]) 10/22/2014  . 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 Rheumatoid arthritis ([place]) 09/26/2013  . Hypertension 01/31/2011  . Hypothyroidism 10/17/2007    Past Surgical History:  Procedure Laterality Date  . ABDOMINAL HYSTERECTOMY    . APPENDECTOMY    . BUNIONECTOMY  10/2009 right & 02/03/10 left  . COLONOSCOPY W/ POLYPECTOMY  2015  . HAMMER TOE SURGERY     "all toes have pins, done with bunionectomy"  . INGUINAL HERNIA REPAIR    . KNEE CLOSED REDUCTION Left 12/05/2017   Procedure: CLOSED MANIPULATION LEFT KNEE;  Surgeon: [name], MD;  Location: [facility];  Service: Orthopedics;  Laterality: Left;  Marland Kitchen MASTECTOMY WITH RADIOACTIVE SEED GUIDED EXCISION AND AXILLARY SENTINEL LYMPH NODE BIOPSY Bilateral 08/21/2018   Procedure: BILATERAL SIMPLE MASTECTOMIES WITH LEFT AXILLARY  RADIOACTIVE SEED GUIDED LYMPH NODE EXCISION AND LEFT AXILLARY SENTINEL LYMPH NODE BIOPSY;  Surgeon: Erroll Luna, MD;  Location: [facility];  Service: General;  Laterality: Bilateral;  . neck fusion     x3  . PORTACATH PLACEMENT Right 09/25/2018   Procedure: INSERTION PORT-A-CATH WITH ULTRASOUND ERAS PATHWAY;  Surgeon: Erroll Luna, MD;  Location: [facility];  Service: General;  Laterality: Right;  . THYROIDECTOMY  2001   for nodules  . TOE AMPUTATION  1979   6th toe removed from each foot  . TOTAL KNEE ARTHROPLASTY Left 10/21/2014   Procedure: LEFT TOTAL KNEE ARTHROPLASTY;  Surgeon: [name], MD;  Location: [facility];  Service: Orthopedics;  Laterality: Left;  .  TOTAL KNEE ARTHROPLASTY Right 12/05/2017   Procedure: RIGHT TOTAL KNEE ARTHROPLASTY;  Surgeon: [name], MD;  Location: [facility];  Service: Orthopedics;  Laterality: Right;  70 mins     OB History   No obstetric history on file.      Home Medications    Prior to Admission medications   Medication Sig Start Date End Date Taking? Authorizing Provider  acetaminophen (TYLENOL) 500 MG tablet Take 1,000 mg by mouth every 6 (six) hours as needed for moderate pain.   Yes [provider]  benazepril-hydrochlorthiazide (LOTENSIN HCT) 10-12.5 MG tablet TAKE 1 TABLET BY MOUTH DAILY 06/26/18  Yes Colin Benton R, DO  diphenhydrAMINE (BENADRYL) 25 MG tablet Take 25-50 mg by mouth every 6 (six) hours as needed for allergies.   Yes [provider]  levothyroxine (SYNTHROID, LEVOTHROID) 175 MCG tablet TAKE 1 TABLET BY MOUTH EVERY MORNING Patient taking differently: Take 175 mcg by mouth daily before breakfast.  07/16/18  Yes [name], DO  lidocaine-prilocaine (EMLA) cream Apply to affected area once Patient taking differently: Apply 1 application topically daily as needed (access port). Apply to affected area once 09/06/18  Yes Nicholas Lose, MD  LORazepam (ATIVAN) 1 MG tablet Take 1 tablet (1 mg total) by mouth at bedtime as needed for sleep. Patient not taking: Reported on 11/24/2018 10/09/18  Yes Nicholas Lose, MD  promethazine (PHENERGAN) 25 MG tablet Take 1 tablet (25 mg total) by mouth every 6 (six) hours as needed for up to 3 days for nausea or vomiting. 11/18/18 11/23/18 Yes Murray, Alyssa B, PA-C  dexamethasone (DECADRON) 1 MG tablet Take 1 tablet (1 mg total) by mouth daily. 11/28/18   Nicholas Lose, MD  ondansetron (ZOFRAN) 8 MG tablet Take 1 tablet (8 mg total) by mouth every 8 (eight) hours as needed for nausea or vomiting. 11/26/18   Mariel Aloe, MD  prochlorperazine (COMPAZINE) 10 MG tablet Take 1 tablet (10 mg total) by mouth every 6 (six) hours as needed (Nausea or  vomiting). Patient not taking: Reported on 11/18/2018 10/18/18   Nicholas Lose, MD    Family History Family History  Problem Relation Age of Onset  . Breast cancer Mother 50       metastatic to brain, died at 12  . Dementia Father        died at 36  . Heart attack Maternal Grandmother   . Stroke Maternal Grandmother   . Diabetes Maternal Grandmother   . Heart disease Maternal Grandmother   . Thyroid disease Maternal Grandmother   . COPD Maternal Aunt   . Cancer Paternal Uncle        details unk  . Esophageal cancer Neg Hx   . Rectal cancer Neg Hx   . Stomach  cancer Neg Hx     Social History Social History   Tobacco Use  . Smoking status: Former Smoker    Packs/day: 0.50    Years: 33.00    Pack years: 16.50    Last attempt to quit: 09/15/2011    Years since quitting: 7.2  . Smokeless tobacco: Never Used  Substance Use Topics  . Alcohol use: Yes    Comment: rare  . Drug use: No     Allergies   Compazine [prochlorperazine edisylate]; Hydrocodone-acetaminophen; Latex; and Tramadol hcl   Review of Systems Review of Systems  All systems reviewed and negative, other than as noted in HPI.  Physical Exam Updated Vital Signs BP 124/78 (BP Location: Right Arm)   Pulse 70   Temp 98.1 F (36.7 C) (Oral)   Resp 16   SpO2 99%   Physical Exam Vitals signs and nursing note reviewed.  Constitutional:      General: She is not in acute distress.    Appearance: She is well-developed.  HENT:     Head: Normocephalic and atraumatic.  Eyes:     General:        Right eye: No discharge.        Left eye: No discharge.     Conjunctiva/sclera: Conjunctivae normal.  Neck:     Musculoskeletal: Neck supple.  Cardiovascular:     Rate and Rhythm: Normal rate and regular rhythm.     Heart sounds: Normal heart sounds. No murmur. No friction rub. No gallop.   Pulmonary:     Effort: Pulmonary effort is normal. No respiratory distress.     Breath sounds: Normal breath sounds.   Abdominal:     General: There is no distension.     Palpations: Abdomen is soft.     Tenderness: There is no abdominal tenderness.  Musculoskeletal:        General: No tenderness.  Skin:    General: Skin is warm and dry.  Neurological:     Mental Status: She is alert.  Psychiatric:        Behavior: Behavior normal.        Thought Content: Thought content normal.      ED Treatments / Results  Labs (all labs ordered are listed, but only abnormal results are displayed) Labs Reviewed  URINALYSIS, ROUTINE W REFLEX MICROSCOPIC - Abnormal; Notable for the following components:      Result Value   Color, Urine STRAW (*)    Specific Gravity, Urine 1.003 (*)    All other components within normal limits  COMPREHENSIVE METABOLIC PANEL - Abnormal; Notable for the following components:   Sodium 134 (*)    Potassium 3.4 (*)    Glucose, Bld 106 (*)    Calcium 8.6 (*)    All other components within normal limits  CBC - Abnormal; Notable for the following components:   RBC 3.49 (*)    Hemoglobin 9.5 (*)    HCT 31.7 (*)    RDW 17.1 (*)    All other components within normal limits  LIPASE, BLOOD    EKG None  Radiology No results found.  Procedures Procedures (including critical care time)  Medications Ordered in ED Medications  sodium chloride 0.9 % bolus 1,000 mL (0 mLs Intravenous Stopped 11/23/18 1441)  loperamide (IMODIUM) capsule 4 mg (4 mg Oral Given 11/23/18 1250)  sodium chloride 0.9 % bolus 1,000 mL (0 mLs Intravenous Stopped 11/23/18 1441)  heparin lock flush 100 unit/mL (500 Units Intracatheter Given  11/23/18 1453)     Initial Impression / Assessment and Plan / ED Course  I have reviewed the triage vital signs and the nursing notes.  Pertinent labs & imaging results that were available during my care of the patient were reviewed by me and considered in my medical decision making (see chart for details).        58 year old female with diarrhea. Recent nausea and  vomiting which is since resolved. Chemo related? Viral? No blood/fever to suggest pathogenic bacterial infection. Crampy abdominal pain.  Abdominal exam is benign.  She is treated symptomatically with improvement.  No diarrhea here in the emergency room.  She is advised to take Imodium as needed.  Return precautions discussed.  Outpatient follow-up otherwise. Final Clinical Impressions(s) / ED Diagnoses   Final diagnoses:  Diarrhea, unspecified type    ED Discharge Orders    None       Virgel Manifold, MD 11/30/18 [phone]

## 2018-12-04 ENCOUNTER — Inpatient Hospital Stay: Payer: BLUE CROSS/BLUE SHIELD

## 2018-12-04 ENCOUNTER — Telehealth: Payer: Self-pay

## 2018-12-04 ENCOUNTER — Other Ambulatory Visit: Payer: Self-pay

## 2018-12-04 ENCOUNTER — Inpatient Hospital Stay (HOSPITAL_BASED_OUTPATIENT_CLINIC_OR_DEPARTMENT_OTHER): Payer: BLUE CROSS/BLUE SHIELD | Admitting: Hematology and Oncology

## 2018-12-04 DIAGNOSIS — Z5111 Encounter for antineoplastic chemotherapy: Secondary | ICD-10-CM | POA: Diagnosis not present

## 2018-12-04 DIAGNOSIS — K2970 Gastritis, unspecified, without bleeding: Secondary | ICD-10-CM | POA: Diagnosis not present

## 2018-12-04 DIAGNOSIS — R630 Anorexia: Secondary | ICD-10-CM | POA: Diagnosis not present

## 2018-12-04 DIAGNOSIS — Z9013 Acquired absence of bilateral breasts and nipples: Secondary | ICD-10-CM | POA: Diagnosis not present

## 2018-12-04 DIAGNOSIS — Z79899 Other long term (current) drug therapy: Secondary | ICD-10-CM

## 2018-12-04 DIAGNOSIS — Z170 Estrogen receptor positive status [ER+]: Secondary | ICD-10-CM

## 2018-12-04 DIAGNOSIS — Z95828 Presence of other vascular implants and grafts: Secondary | ICD-10-CM

## 2018-12-04 DIAGNOSIS — C50412 Malignant neoplasm of upper-outer quadrant of left female breast: Secondary | ICD-10-CM | POA: Diagnosis not present

## 2018-12-04 DIAGNOSIS — R110 Nausea: Secondary | ICD-10-CM | POA: Diagnosis not present

## 2018-12-04 DIAGNOSIS — R5383 Other fatigue: Secondary | ICD-10-CM | POA: Diagnosis not present

## 2018-12-04 DIAGNOSIS — R197 Diarrhea, unspecified: Secondary | ICD-10-CM | POA: Diagnosis not present

## 2018-12-04 LAB — CMP (CANCER CENTER ONLY)
ALT: 38 U/L (ref 0–44)
AST: 25 U/L (ref 15–41)
Albumin: 4 g/dL (ref 3.5–5.0)
Alkaline Phosphatase: 55 U/L (ref 38–126)
Anion gap: 13 (ref 5–15)
BUN: 8 mg/dL (ref 6–20)
CO2: 26 mmol/L (ref 22–32)
Calcium: 9.2 mg/dL (ref 8.9–10.3)
Chloride: 101 mmol/L (ref 98–111)
Creatinine: 0.82 mg/dL (ref 0.44–1.00)
GFR, Est AFR Am: >60 mL/min (ref >60)
GFR, Estimated: >60 mL/min (ref >60)
Glucose, Bld: 132 mg/dL — ABNORMAL HIGH (ref 70–99)
Potassium: 3.7 mmol/L (ref 3.5–5.1)
SODIUM: 140 mmol/L (ref 135–145)
Total Bilirubin: 0.3 mg/dL (ref 0.3–1.2)
Total Protein: 7.4 g/dL (ref 6.5–8.1)

## 2018-12-04 LAB — CBC WITH DIFFERENTIAL (CANCER CENTER ONLY)
Abs Immature Granulocytes: 0.06 10*3/uL (ref 0.00–0.07)
Basophils Absolute: 0.1 10*3/uL (ref 0.0–0.1)
Basophils Relative: 1 %
Eosinophils Absolute: 0.2 10*3/uL (ref 0.0–0.5)
Eosinophils Relative: 3 %
HCT: 37.8 % (ref 36.0–46.0)
Hemoglobin: 11.4 g/dL — ABNORMAL LOW (ref 12.0–15.0)
IMMATURE GRANULOCYTES: 1 %
Lymphocytes Relative: 11 %
Lymphs Abs: 0.8 10*3/uL (ref 0.7–4.0)
MCH: 26.6 pg (ref 26.0–34.0)
MCHC: 30.2 g/dL (ref 30.0–36.0)
MCV: 88.3 fL (ref 80.0–100.0)
Monocytes Absolute: 0.7 10*3/uL (ref 0.1–1.0)
Monocytes Relative: 10 %
NEUTROS PCT: 74 %
Neutro Abs: 5.7 10*3/uL (ref 1.7–7.7)
Platelet Count: 509 10*3/uL — ABNORMAL HIGH (ref 150–400)
RBC: 4.28 MIL/uL (ref 3.87–5.11)
RDW: 17.5 % — ABNORMAL HIGH (ref 11.5–15.5)
WBC Count: 7.5 10*3/uL (ref 4.0–10.5)
nRBC: 0 % (ref 0.0–0.2)

## 2018-12-04 MED ORDER — PANTOPRAZOLE SODIUM 40 MG PO TBEC: 40.0000 mg | DELAYED_RELEASE_TABLET | Freq: Every day | ORAL | 3 refills | Status: DC

## 2018-12-04 MED ORDER — DEXAMETHASONE 1 MG PO TABS: 1.0000 mg | ORAL_TABLET | Freq: Every day | ORAL | 0 refills | Status: DC

## 2018-12-04 MED ORDER — SODIUM CHLORIDE 0.9% FLUSH
10.0000 mL | INTRAVENOUS | Status: DC | PRN
  Administered 2018-12-04: 10 mL via INTRAVENOUS
  Filled 2018-12-04: qty 10

## 2018-12-04 NOTE — Progress Notes (Signed)
Patient Care Team: [name], DO as PCP - General (Family Medicine) Hurley Cisco, MD as Consulting Physician (Rheumatology) [name], MD as Consulting Physician (Orthopedic Surgery) Erroll Luna, MD as Consulting Physician (General Surgery) Nicholas Lose, MD as Consulting Physician (Hematology and Oncology) Kyung Rudd, MD as Consulting Physician (Radiation Oncology) Maxwell Marion, RN as Registered Nurse (Medical Oncology)  DIAGNOSIS:    ICD-10-CM   1. Malignant neoplasm of upper-outer quadrant of left breast in female, estrogen receptor positive ([place]) C50.412    Z17.0     SUMMARY OF ONCOLOGIC HISTORY:   Malignant neoplasm of upper-outer quadrant of left breast in female, estrogen receptor positive ([place])   07/13/2018 Initial Diagnosis     Palpable left breast mass, 2 adjacent masses at 2 o'clock position 2 cm biopsy IDC grade 2-3, Ig DCIS, ER 95%, PR 90%, Ki-67 30%, HER-2 -1+; 2:30 position intramammary lymph node 1.3 cm biopsy positive; left axillary lymph node biopsy negative discordant, T2N1, stage IIa    07/25/2018 Cancer Staging    Staging form: Breast, AJCC 8th Edition - Clinical: Stage IIA (cT1c, cN1, cM0, G3, ER+, PR+, HER2-) - Signed by Nicholas Lose, MD on 07/25/2018    08/21/2018 Surgery    Bilateral mastectomies: Left mastectomy: IDC grade 3, 2.1 cm, high-grade DCIS, margins negative, negative for LV I, 3/9 lymph nodes positive, ER 95% positive, PR 90% positive, HER-2 -1+, Ki-67 30% T2N1A stage IIa right mastectomy: Benign    09/03/2018 Cancer Staging    Staging form: Breast, AJCC 8th Edition - Pathologic: Stage IIA (pT2, pN1a, cM0, G3, ER+, PR+, HER2-) - Signed by Nicholas Lose, MD on 09/03/2018    09/03/2018 Miscellaneous    MammaPrint: High risk luminal type B, average 10-year risk of recurrence untreated 29%, predicted 5-year benefit of chemotherapy 94.6% distant metastasis free interval    09/26/2018 - 12/04/2018 Chemotherapy    Adjuvant  chemotherapy with dose dense Adriamycin and Cytoxan x4 followed by weekly Taxol x1     CHIEF COMPLIANT: Cycle 2 Taxol (cancelled)   INTERVAL HISTORY: Paula Jacobs is a 58 y.o. with above-mentioned history of left breast cancerwhounderwent bilateral mastectomiesandcompleted 4 cycles ofdose dense Adriamycin and Cytoxan and is currently on weekly Taxol treatments. She presented to the ED on 11/24/18 for severe nausea, vomiting, and diarrhea and was admitted for IV fluids and discharged on 11/26/18. She presents to the clinic today for Cycle 2 of Taxol. She notes continued complete lack of appetite that has not improved with '1mg'$  daily dexamethasone, fatigue, stomach pain, and is generally not feeling like herself. She denies diarrhea but reports 1 loose stool this morning. Her labs from today show: Hg 11.4, WBC 7.5, platelets 509, ANC 5.7.   REVIEW OF SYSTEMS:   Constitutional: Denies fevers, chills or abnormal weight loss (+) fatigue (+) loss of appetite Eyes: Denies blurriness of vision Ears, nose, mouth, throat, and face: Denies mucositis or sore throat Respiratory: Denies cough, dyspnea or wheezes Cardiovascular: Denies palpitation, chest discomfort Gastrointestinal: Denies nausea, heartburn (+) loose stool (+) stomach pain Skin: Denies abnormal skin rashes Lymphatics: Denies new lymphadenopathy or easy bruising Neurological: Denies numbness, tingling or new weaknesses Behavioral/Psych: Mood is stable, no new changes  Extremities: No lower extremity edema Breast: denies any pain or lumps or nodules in either breasts All other systems were reviewed with the patient and are negative.  I have reviewed the past medical history, past surgical history, social history and family history with the patient and they are unchanged  from previous note.  ALLERGIES:  is allergic to compazine [prochlorperazine edisylate]; hydrocodone-acetaminophen; latex; and tramadol hcl.  MEDICATIONS:  Current  Outpatient Medications  Medication Sig Dispense Refill  . acetaminophen (TYLENOL) 500 MG tablet Take 1,000 mg by mouth every 6 (six) hours as needed for moderate pain.    . benazepril-hydrochlorthiazide (LOTENSIN HCT) 10-12.5 MG tablet TAKE 1 TABLET BY MOUTH DAILY 90 tablet 0  . dexamethasone (DECADRON) 1 MG tablet Take 1 tablet (1 mg total) by mouth daily. 60 tablet 0  . diphenhydrAMINE (BENADRYL) 25 MG tablet Take 25-50 mg by mouth every 6 (six) hours as needed for allergies.    Marland Kitchen levothyroxine (SYNTHROID, LEVOTHROID) 175 MCG tablet TAKE 1 TABLET BY MOUTH EVERY MORNING (Patient taking differently: Take 175 mcg by mouth daily before breakfast. ) 30 tablet 5  . ondansetron (ZOFRAN) 8 MG tablet Take 1 tablet (8 mg total) by mouth every 8 (eight) hours as needed for nausea or vomiting. 30 tablet 0  . pantoprazole (PROTONIX) 40 MG tablet Take 1 tablet (40 mg total) by mouth daily. 30 tablet 3  . promethazine (PHENERGAN) 25 MG tablet Take 1 tablet (25 mg total) by mouth every 6 (six) hours as needed for up to 3 days for nausea or vomiting. 12 tablet 0   No current facility-administered medications for this visit.    Facility-Administered Medications Ordered in Other Visits  Medication Dose Route Frequency Provider Last Rate Last Dose  . heparin lock flush 100 unit/mL  500 Units Intracatheter Once PRN Nicholas Lose, MD      . sodium chloride flush (NS) 0.9 % injection 10 mL  10 mL Intracatheter PRN Nicholas Lose, MD        PHYSICAL EXAMINATION: ECOG PERFORMANCE STATUS: 2 - Symptomatic, <50% confined to bed  Vitals:   12/04/18 1027  BP: 100/72  Pulse: (!) 109  Resp: 19  Temp: 98.7 F (37.1 C)  SpO2: 100%   Filed Weights   12/04/18 1027  Weight: 186 lb 1.6 oz (84.4 kg)    GENERAL: alert, no distress and comfortable SKIN: skin color, texture, turgor are normal, no rashes or significant lesions EYES: normal, Conjunctiva are pink and non-injected, sclera clear OROPHARYNX: no exudate, no  erythema and lips, buccal mucosa, and tongue normal  NECK: supple, thyroid normal size, non-tender, without nodularity LYMPH: no palpable lymphadenopathy in the cervical, axillary or inguinal LUNGS: clear to auscultation and percussion with normal breathing effort HEART: regular rate & rhythm and no murmurs and no lower extremity edema ABDOMEN: abdomen soft, non-tender and normal bowel sounds MUSCULOSKELETAL: no cyanosis of digits and no clubbing  NEURO: alert & oriented x 3 with fluent speech, no focal motor/sensory deficits EXTREMITIES: No lower extremity edema  LABORATORY DATA:  I have reviewed the data as listed CMP Latest Ref Rng & Units 12/04/2018 11/25/2018 11/24/2018  Glucose 70 - 99 mg/dL 132(H) 93 101(H)  BUN 6 - 20 mg/dL '8 6 7  '$ Creatinine 0.44 - 1.00 mg/dL 0.82 0.60 0.55  Sodium 135 - 145 mmol/L 140 138 139  Potassium 3.5 - 5.1 mmol/L 3.7 3.7 3.4(L)  Chloride 98 - 111 mmol/L 101 106 107  CO2 22 - 32 mmol/L '26 26 23  '$ Calcium 8.9 - 10.3 mg/dL 9.2 8.1(L) 8.5(L)  Total Protein 6.5 - 8.1 g/dL 7.4 6.0(L) 6.8  Total Bilirubin 0.3 - 1.2 mg/dL 0.3 0.4 0.3  Alkaline Phos 38 - 126 U/L 55 43 52  AST 15 - 41 U/L '25 17 17  '$ ALT  0 - 44 U/L 38 17 18    Lab Results  Component Value Date   WBC 7.5 12/04/2018   HGB 11.4 (L) 12/04/2018   HCT 37.8 12/04/2018   MCV 88.3 12/04/2018   PLT 509 (H) 12/04/2018   NEUTROABS 5.7 12/04/2018    ASSESSMENT & PLAN:  Malignant neoplasm of upper-outer quadrant of left breast in female, estrogen receptor positive ([place]) 08/21/2018:Bilateral mastectomies: Left mastectomy: IDC grade 3, 2.1 cm, high-grade DCIS, margins negative, negative for LV I, 3/9 lymph nodes positive, ER 95% positive, PR 90% positive, HER-2 -1+, Ki-67 30% T2N1A stage IIa right mastectomy: Benign MammaPrint: High risk luminal type B Echocardiogram 09/18/2018: EF 55 to 60% Patient has been enrolled and upbeat clinical trial: No adverse effects related to the trial  Treatment plan:  1.Adjuvant chemotherapy with dose dense Adriamycin and Cytoxan x4 followed by weekly Taxol x12 2.Followed by adjuvant antiestrogen therapy ------------------------------------------------------------------------------------------------------------------ Current Treatment:Completed 4 cycles of dose dense Adriamycin and Cytoxan, received cycle 1 Taxol, discontinued for intolerance  Chemo Toxicities: 1.Nausea: Very severe  2.Severe fatigue:reviewed brief periods of exercise 3.  Extremely poor appetite: Increase dexamethasone to 2 mg daily.   4. Diarrhea:  Resolved 5.  Gastritis: I sent a prescription for Protonix today.  Her labs have been reviewed and they look markedly improved.    No orders of the defined types were placed in this encounter.  The patient has a good understanding of the overall plan. she agrees with it. she will call with any problems that may develop before the next visit here.  Nicholas Lose, MD 12/04/2018  Julious Oka Dorshimer am acting as scribe for Dr. Nicholas Lose.  I have reviewed the above documentation for accuracy and completeness, and I agree with the above.

## 2018-12-04 NOTE — Telephone Encounter (Signed)
Covid-19 travel screening questions  Have you traveled in the last 14 days? If yes where?  Do you now or have you had a fever in the last 14 days?  Do you have any respiratory symptoms of shortness of breath or cough now or in the last 14 days?  Do you have a medical history of Congestive Heart Failure?  Do you have a medical history of lung disease?  Do you have any family members or close contacts with diagnosed or suspected Covid-19?    Left a message with the questions. Requested call back today if she answers YES to any of the questions.

## 2018-12-04 NOTE — Telephone Encounter (Signed)
Covid-19 travel screening questions  Have you traveled in the last 14 days? If yes where?  Do you now or have you had a fever in the last 14 days?  Do you have any respiratory symptoms of shortness of breath or cough now or in the last 14 days?  Do you have a medical history of Congestive Heart Failure? N/A  Do you have a medical history of lung disease? N/A  Do you have any family members or close contacts with diagnosed or suspected Covid-19?  Left message for pt to call back to reschedule her appt if she answers yes to any of the above questions.

## 2018-12-04 NOTE — Assessment & Plan Note (Signed)
08/21/2018:Bilateral mastectomies: Left mastectomy: IDC grade 3, 2.1 cm, high-grade DCIS, margins negative, negative for LV I, 3/9 lymph nodes positive, ER 95% positive, PR 90% positive, HER-2 -1+, Ki-67 30% T2N1A stage IIa right mastectomy: Benign MammaPrint: High risk luminal type B Echocardiogram 09/18/2018: EF 55 to 60% Patient has been enrolled and upbeat clinical trial: No adverse effects related to the trial  Treatment plan: 1.Adjuvant chemotherapy with dose dense Adriamycin and Cytoxan x4 followed by weekly Taxol x12 2.Followed by adjuvant antiestrogen therapy ------------------------------------------------------------------------------------------------------------------ Current Treatment:Completed 4 cycles of dose dense Adriamycin and Cytoxan, received cycle 1 Taxol, discontinued for intolerance  Chemo Toxicities: 1.Nausea: Very severe  2.Severe fatigue:reviewed brief periods of exercise 3.  Extremely poor appetite: Increase dexamethasone to 2 mg daily.   4. Diarrhea: Resolved 5.  Gastritis: I sent a prescription for Protonix today.  Her labs have been reviewed and they look markedly improved.

## 2018-12-05 ENCOUNTER — Telehealth: Payer: Self-pay | Admitting: Hematology and Oncology

## 2018-12-05 ENCOUNTER — Ambulatory Visit: Payer: BLUE CROSS/BLUE SHIELD | Admitting: Gastroenterology

## 2018-12-05 NOTE — Progress Notes (Signed)
Pt came in the lobby to request updated [employer] form to be sent out, since pt will not be able to return to work until she sees Dr.Gudena back in 1 month. Will have disability form filled out and faxed.

## 2018-12-05 NOTE — Telephone Encounter (Signed)
Tried to reach regarding 4/21 °

## 2018-12-11 ENCOUNTER — Other Ambulatory Visit: Payer: BLUE CROSS/BLUE SHIELD

## 2018-12-11 ENCOUNTER — Ambulatory Visit: Payer: BLUE CROSS/BLUE SHIELD | Admitting: Adult Health

## 2018-12-11 ENCOUNTER — Ambulatory Visit: Payer: BLUE CROSS/BLUE SHIELD

## 2018-12-17 ENCOUNTER — Encounter: Payer: Self-pay | Admitting: Family Medicine

## 2018-12-17 ENCOUNTER — Ambulatory Visit (INDEPENDENT_AMBULATORY_CARE_PROVIDER_SITE_OTHER): Payer: BLUE CROSS/BLUE SHIELD | Admitting: Family Medicine

## 2018-12-17 ENCOUNTER — Other Ambulatory Visit: Payer: Self-pay

## 2018-12-17 DIAGNOSIS — E039 Hypothyroidism, unspecified: Secondary | ICD-10-CM

## 2018-12-17 DIAGNOSIS — I10 Essential (primary) hypertension: Secondary | ICD-10-CM | POA: Diagnosis not present

## 2018-12-17 MED ORDER — LEVOTHYROXINE SODIUM 175 MCG PO TABS: 175.0000 ug | ORAL_TABLET | Freq: Every morning | ORAL | 5 refills | Status: DC

## 2018-12-17 MED ORDER — BENAZEPRIL-HYDROCHLOROTHIAZIDE 10-12.5 MG PO TABS: 1.0000 | ORAL_TABLET | Freq: Every day | ORAL | 0 refills | Status: DC

## 2018-12-17 NOTE — Progress Notes (Signed)
Virtual Visit via Video Note  I connected with [name] on 12/17/18 at 10:00 AM EDT by a video enabled telemedicine application and verified that I am speaking with the correct person using two identifiers.  Location patient: home Location provider:work or home office Persons participating in the virtual visit: patient, provider  I discussed the limitations of evaluation and management by telemedicine and the availability of in person appointments. The patient expressed understanding and agreed to proceed.   HPI:   Paula Jacobs is a very pleasant 58 yo with a PMH significant for HTN, Aneurysm (see neurosurgeon), Hypothyroidism, RA, Morbid obesity and unfortunately breast Ca recently (managed by her Oncology and breast team). Reports off chemo and appetite much better now. Now no GI symptoms. S/p double mastectomy. She is able to stay home. She lost weight with her chemo. BP has been much better. BP running 120/70s. Had labs recently and they looked good. She is due for a thyroid check.no CP, SOB, DOE, GI symptoms, NV.  ROS: See pertinent positives and negatives per HPI.  Past Medical History:  Diagnosis Date  . ANEMIA-NOS 04/23/2008  . Aneurysm ([place]) 10/19/2017   Evaluated by [facility] 10/2017, 58mm, stable  . Anginal pain ([place])    went to ED in april 2018 c/o chest pain over last 2 months ; had EKG, CXR  and troponin negative per physician suspected musculoskeletal  ; dc'd with ibuprofen  and recc f/u with outpatient stress test; see care everywhere ED visit  ; patient denies recurrence of Chest pain since, endorses occ palpitations   . DEPRESSION 04/23/2008  . DJD (degenerative joint disease)   . Family history of breast cancer   . Hyperplastic colon polyp    x2  . Hypertension   . HYPOTHYROIDISM 10/17/2007  . Osteoarthritis   . Palpitations    freq at night;  at pre-op states she drinks caffeine and that makes it worse; says the palpitations started after they took her thyroid   . PAT  10/27/2009   Qualifier: Diagnosis of  By: [name], MD, [name]    . Pneumonia    "in the past, havent had it in years"  . PONV (postoperative nausea and vomiting)    only after 1979 surgery  . RA (rheumatoid arthritis) ([place])    "problems in feet, hands, and knees"  . 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 SVT (supraventricular tachycardia) ([facility])    chronic   . Tubulovillous adenoma of colon     Past Surgical History:  Procedure Laterality Date  . ABDOMINAL HYSTERECTOMY    . APPENDECTOMY    . BUNIONECTOMY  10/2009 right & 02/03/10 left  . COLONOSCOPY W/ POLYPECTOMY  2015  . HAMMER TOE SURGERY     "all toes have pins, done with bunionectomy"  . INGUINAL HERNIA REPAIR    . KNEE CLOSED REDUCTION Left 12/05/2017   Procedure: CLOSED MANIPULATION LEFT KNEE;  Surgeon: [name], MD;  Location: [facility];  Service: Orthopedics;  Laterality: Left;  Marland Kitchen MASTECTOMY WITH RADIOACTIVE SEED GUIDED EXCISION AND AXILLARY SENTINEL LYMPH NODE BIOPSY Bilateral 08/21/2018   Procedure: BILATERAL SIMPLE MASTECTOMIES WITH LEFT AXILLARY RADIOACTIVE SEED GUIDED LYMPH NODE EXCISION AND LEFT AXILLARY SENTINEL LYMPH NODE BIOPSY;  Surgeon: Erroll Luna, MD;  Location: [facility];  Service: General;  Laterality: Bilateral;  . neck fusion     x3  . PORTACATH PLACEMENT Right 09/25/2018   Procedure: INSERTION PORT-A-CATH WITH ULTRASOUND ERAS PATHWAY;  Surgeon: Erroll Luna,  MD;  Location: [facility];  Service: General;  Laterality: Right;  . THYROIDECTOMY  2001   for nodules  . TOE AMPUTATION  1979   6th toe removed from each foot  . TOTAL KNEE ARTHROPLASTY Left 10/21/2014   Procedure: LEFT TOTAL KNEE ARTHROPLASTY;  Surgeon: [name], MD;  Location: [facility];  Service: Orthopedics;  Laterality: Left;  . TOTAL KNEE ARTHROPLASTY Right 12/05/2017   Procedure: RIGHT TOTAL KNEE ARTHROPLASTY;  Surgeon: [name], MD;  Location: [facility];  Service: Orthopedics;  Laterality: Right;  70 mins     Family History  Problem Relation Age of Onset  . Breast cancer Mother 27       metastatic to brain, died at 59  . Dementia Father        died at 41  . Heart attack Maternal Grandmother   . Stroke Maternal Grandmother   . Diabetes Maternal Grandmother   . Heart disease Maternal Grandmother   . Thyroid disease Maternal Grandmother   . COPD Maternal Aunt   . Cancer Paternal Uncle        details unk  . Esophageal cancer Neg Hx   . Rectal cancer Neg Hx   . Stomach cancer Neg Hx     SOCIAL HX: see hpi   Current Outpatient Medications:  .  acetaminophen (TYLENOL) 500 MG tablet, Take 1,000 mg by mouth every 6 (six) hours as needed for moderate pain., Disp: , Rfl:  .  benazepril-hydrochlorthiazide (LOTENSIN HCT) 10-12.5 MG tablet, Take 1 tablet by mouth daily., Disp: 90 tablet, Rfl: 0 .  diphenhydrAMINE (BENADRYL) 25 MG tablet, Take 25-50 mg by mouth every 6 (six) hours as needed for allergies., Disp: , Rfl:  .  levothyroxine (SYNTHROID, LEVOTHROID) 175 MCG tablet, Take 1 tablet (175 mcg total) by mouth every morning., Disp: 30 tablet, Rfl: 5 .  ondansetron (ZOFRAN) 8 MG tablet, Take 1 tablet (8 mg total) by mouth every 8 (eight) hours as needed for nausea or vomiting., Disp: 30 tablet, Rfl: 0 .  promethazine (PHENERGAN) 25 MG tablet, Take 1 tablet (25 mg total) by mouth every 6 (six) hours as needed for up to 3 days for nausea or vomiting., Disp: 12 tablet, Rfl: 0 No current facility-administered medications for this visit.   Facility-Administered Medications Ordered in Other Visits:  .  heparin lock flush 100 unit/mL, 500 Units, Intracatheter, Once PRN, Nicholas Lose, MD .  sodium chloride flush (NS) 0.9 % injection 10 mL, 10 mL, Intracatheter, PRN, Nicholas Lose, MD  EXAM:  VITALS per patient if applicable: 024/09 BP  GENERAL: alert, oriented, appears well and in no acute distress  HEENT: atraumatic, conjunttiva clear, no obvious abnormalities on inspection of external nose  and ears  NECK: normal movements of the head and neck  LUNGS: on inspection no signs of respiratory distress, breathing rate appears normal, no obvious gross SOB, gasping or wheezing  CV: no obvious cyanosis  MS: moves all visible extremities without noticeable abnormality  PSYCH/NEURO: pleasant and cooperative, no obvious depression or anxiety, speech and thought processing grossly intact  ASSESSMENT AND PLAN:  Discussed the following assessment and plan:  Hypothyroidism, unspecified type - Plan: TSH  Morbid obesity ([place])  Essential hypertension  -reviewed meds/labs/lifestyle -due for TSH, she agreed to do, prefers to try to get done at cancer center - advised if can not can arrange for her to do with our lab, she agrees to call -refilled meds -TOC with Dr. Ethlyn Gallery in 3  months   I discussed the assessment and treatment plan with the patient. The patient was provided an opportunity to ask questions and all were answered. The patient agreed with the plan and demonstrated an understanding of the instructions.   The patient was advised to call back or seek an in-person evaluation if the symptoms worsen or if the condition fails to improve as anticipated.   Follow up instructions: Advised assistant Wendie Simmer to help patient arrange the following: -TOC with Dr. Ethlyn Gallery in 3 months -Check to see if she needs lab visit here or will do TSH at cancer center    [name], DO

## 2018-12-18 ENCOUNTER — Ambulatory Visit: Payer: BLUE CROSS/BLUE SHIELD

## 2018-12-18 ENCOUNTER — Other Ambulatory Visit: Payer: BLUE CROSS/BLUE SHIELD

## 2018-12-25 ENCOUNTER — Other Ambulatory Visit: Payer: BLUE CROSS/BLUE SHIELD

## 2018-12-25 ENCOUNTER — Ambulatory Visit: Payer: BLUE CROSS/BLUE SHIELD | Admitting: Adult Health

## 2018-12-25 ENCOUNTER — Ambulatory Visit: Payer: BLUE CROSS/BLUE SHIELD

## 2019-01-01 ENCOUNTER — Ambulatory Visit: Payer: BLUE CROSS/BLUE SHIELD

## 2019-01-01 ENCOUNTER — Other Ambulatory Visit: Payer: BLUE CROSS/BLUE SHIELD

## 2019-01-07 ENCOUNTER — Telehealth: Payer: Self-pay | Admitting: Hematology and Oncology

## 2019-01-07 NOTE — Progress Notes (Signed)
HEMATOLOGY-ONCOLOGY [place] VISIT PROGRESS NOTE  I connected with Paula Jacobs on 01/08/2019 at 11:00 AM EDT by Webex video conference and verified that I am speaking with the correct person using two identifiers.  I discussed the limitations, risks, security and privacy concerns of performing an evaluation and management service by Webex and the availability of in person appointments.  I also discussed with the patient that there may be a patient responsible charge related to this service. The patient expressed understanding and agreed to proceed.  Patient's Location: Home Physician Location: Clinic  CHIEF COMPLIANT: Follow-up to discuss further treatment  INTERVAL HISTORY: Paula Jacobs is a 58 y.o. female with above-mentioned history of left breast cancer treated with bilateral mastectomies and adjuvant chemotherapy who presents today over Webex to discuss further treatment.  Marked improvement in energy levels and nausea or vomiting and other adverse effects due to chemotherapy.  She is feeling significantly better.    Malignant neoplasm of upper-outer quadrant of left breast in female, estrogen receptor positive ([place])   07/13/2018 Initial Diagnosis     Palpable left breast mass, 2 adjacent masses at 2 o'clock position 2 cm biopsy IDC grade 2-3, Ig DCIS, ER 95%, PR 90%, Ki-67 30%, HER-2 -1+; 2:30 position intramammary lymph node 1.3 cm biopsy positive; left axillary lymph node biopsy negative discordant, T2N1, stage IIa    07/25/2018 Cancer Staging    Staging form: Breast, AJCC 8th Edition - Clinical: Stage IIA (cT1c, cN1, cM0, G3, ER+, PR+, HER2-) - Signed by Nicholas Lose, MD on 07/25/2018    08/21/2018 Surgery    Bilateral mastectomies: Left mastectomy: IDC grade 3, 2.1 cm, high-grade DCIS, margins negative, negative for LV I, 3/9 lymph nodes positive, ER 95% positive, PR 90% positive, HER-2 -1+, Ki-67 30% T2N1A stage IIa right mastectomy: Benign    09/03/2018 Cancer Staging    Staging form: Breast, AJCC 8th Edition - Pathologic: Stage IIA (pT2, pN1a, cM0, G3, ER+, PR+, HER2-) - Signed by Nicholas Lose, MD on 09/03/2018    09/03/2018 Miscellaneous    MammaPrint: High risk luminal type B, average 10-year risk of recurrence untreated 29%, predicted 5-year benefit of chemotherapy 94.6% distant metastasis free interval    09/26/2018 - 12/04/2018 Chemotherapy    Adjuvant chemotherapy with dose dense Adriamycin and Cytoxan x4 followed by weekly Taxol x1    REVIEW OF SYSTEMS:   Constitutional: Denies fevers, chills or abnormal weight loss Eyes: Denies blurriness of vision Ears, nose, mouth, throat, and face: Denies mucositis or sore throat Respiratory: Denies cough, dyspnea or wheezes Cardiovascular: Denies palpitation, chest discomfort Gastrointestinal:  Denies nausea, heartburn or change in bowel habits Skin: Denies abnormal skin rashes Lymphatics: Denies new lymphadenopathy or easy bruising Neurological:Denies numbness, tingling or new weaknesses Behavioral/Psych: Mood is stable, no new changes  Extremities: No lower extremity edema Breast: denies any pain or lumps or nodules in either breasts All other systems were reviewed with the patient and are negative.  Observations/Objective:  There were no vitals filed for this visit. There is no height or weight on file to calculate BMI.  I have reviewed the data as listed CMP Latest Ref Rng & Units 12/04/2018 11/25/2018 11/24/2018  Glucose 70 - 99 mg/dL 132(H) 93 101(H)  BUN 6 - 20 mg/dL '8 6 7  '$ Creatinine 0.44 - 1.00 mg/dL 0.82 0.60 0.55  Sodium 135 - 145 mmol/L 140 138 139  Potassium 3.5 - 5.1 mmol/L 3.7 3.7 3.4(L)  Chloride 98 - 111 mmol/L 101 106 107  CO2 22 - 32 mmol/L '26 26 23  '$ Calcium 8.9 - 10.3 mg/dL 9.2 8.1(L) 8.5(L)  Total Protein 6.5 - 8.1 g/dL 7.4 6.0(L) 6.8  Total Bilirubin 0.3 - 1.2 mg/dL 0.3 0.4 0.3  Alkaline Phos 38 - 126 U/L 55 43 52  AST 15 - 41 U/L '25 17 17  '$ ALT 0 - 44 U/L 38 17 18    Lab  Results  Component Value Date   WBC 7.5 12/04/2018   HGB 11.4 (L) 12/04/2018   HCT 37.8 12/04/2018   MCV 88.3 12/04/2018   PLT 509 (H) 12/04/2018   NEUTROABS 5.7 12/04/2018      Assessment Plan:  Malignant neoplasm of upper-outer quadrant of left breast in female, estrogen receptor positive ([place]) 08/21/2018:Bilateral mastectomies: Left mastectomy: IDC grade 3, 2.1 cm, high-grade DCIS, margins negative, negative for LV I, 3/9 lymph nodes positive, ER 95% positive, PR 90% positive, HER-2 -1+, Ki-67 30% T2N1A stage IIa right mastectomy: Benign MammaPrint: High risk luminal type B Echocardiogram 09/18/2018: EF 55 to 60% Patient has been enrolled and upbeat clinical trial: No adverse effects related to the trial  Treatment plan: 1.Adjuvant chemotherapy with dose dense Adriamycin and Cytoxan x4 followed by weekly Taxol x1 discontinued due to intolerance 2.Followed by adjuvant antiestrogen therapy ------------------------------------------------------------------------------------------------------------------ Current Treatment:Antiestrogen therapy between tamoxifen and anastrozole to be started after radiation is complete. Because she had hysterectomy we are not certain of her menopausal status. We will obtain [facility] and estradiol today.  I discussed with her that she needs to start radiation within the next month. She would like to do radiation in [place]. We will request our breast navigator's to help her get an appointment for consultation there.   Toxicities related to prior chemotherapy: Nausea and vomiting have resolved.  Severe fatigue has improved.  Return to clinic after radiation is complete to start antiestrogen therapy.  Patient will call us to inform us of when she completes radiation.    I discussed the assessment and treatment plan with the patient. The patient was provided an opportunity to ask questions and all were answered. The patient agreed with the plan and  demonstrated an understanding of the instructions. The patient was advised to call back or seek an in-person evaluation if the symptoms worsen or if the condition fails to improve as anticipated.   I provided 22 minutes of face-to-face Web Ex time during this encounter.    Rulon Eisenmenger, MD 01/08/2019    I, Molly Dorshimer, am acting as scribe for Nicholas Lose, MD.  I have reviewed the above documentation for accuracy and completeness, and I agree with the above.

## 2019-01-07 NOTE — Telephone Encounter (Signed)
Left voicemail for patient regarding her Webex appointment. I told her told download the [employer] App onto one of her devices. And sent her join link to [email or web address]. Told her to click the green "join meeting" button and that would populate the appointment details. Would also prompt her to download the app if she hadn't already.   Told her if she was unable to do a video call with Dr. Lindi Adie, to call us back and we would convert it to a phone call visit.

## 2019-01-08 ENCOUNTER — Ambulatory Visit: Payer: BLUE CROSS/BLUE SHIELD

## 2019-01-08 ENCOUNTER — Encounter: Payer: Self-pay | Admitting: *Deleted

## 2019-01-08 ENCOUNTER — Other Ambulatory Visit: Payer: Self-pay

## 2019-01-08 ENCOUNTER — Inpatient Hospital Stay: Payer: BLUE CROSS/BLUE SHIELD | Attending: Hematology and Oncology | Admitting: Hematology and Oncology

## 2019-01-08 ENCOUNTER — Other Ambulatory Visit: Payer: BLUE CROSS/BLUE SHIELD

## 2019-01-08 ENCOUNTER — Inpatient Hospital Stay: Payer: BLUE CROSS/BLUE SHIELD

## 2019-01-08 DIAGNOSIS — Z95828 Presence of other vascular implants and grafts: Secondary | ICD-10-CM

## 2019-01-08 DIAGNOSIS — Z170 Estrogen receptor positive status [ER+]: Secondary | ICD-10-CM | POA: Diagnosis not present

## 2019-01-08 DIAGNOSIS — Z9013 Acquired absence of bilateral breasts and nipples: Secondary | ICD-10-CM | POA: Diagnosis not present

## 2019-01-08 DIAGNOSIS — C50412 Malignant neoplasm of upper-outer quadrant of left female breast: Secondary | ICD-10-CM | POA: Diagnosis not present

## 2019-01-08 DIAGNOSIS — Z9221 Personal history of antineoplastic chemotherapy: Secondary | ICD-10-CM

## 2019-01-08 LAB — CBC WITH DIFFERENTIAL (CANCER CENTER ONLY)
Abs Immature Granulocytes: 0.03 10*3/uL (ref 0.00–0.07)
Basophils Absolute: 0.1 10*3/uL (ref 0.0–0.1)
Basophils Relative: 1 %
Eosinophils Absolute: 0.1 10*3/uL (ref 0.0–0.5)
Eosinophils Relative: 2 %
HCT: 38.9 % (ref 36.0–46.0)
Hemoglobin: 11.9 g/dL — ABNORMAL LOW (ref 12.0–15.0)
Immature Granulocytes: 0 %
Lymphocytes Relative: 19 %
Lymphs Abs: 1.3 10*3/uL (ref 0.7–4.0)
MCH: 26.6 pg (ref 26.0–34.0)
MCHC: 30.6 g/dL (ref 30.0–36.0)
MCV: 87 fL (ref 80.0–100.0)
Monocytes Absolute: 0.4 10*3/uL (ref 0.1–1.0)
Monocytes Relative: 6 %
Neutro Abs: 4.9 10*3/uL (ref 1.7–7.7)
Neutrophils Relative %: 72 %
Platelet Count: 518 10*3/uL — ABNORMAL HIGH (ref 150–400)
RBC: 4.47 MIL/uL (ref 3.87–5.11)
RDW: 14.6 % (ref 11.5–15.5)
WBC Count: 6.9 10*3/uL (ref 4.0–10.5)
nRBC: 0 % (ref 0.0–0.2)

## 2019-01-08 LAB — TSH: TSH: 0.161 u[IU]/mL — ABNORMAL LOW (ref 0.308–3.960)

## 2019-01-08 MED ORDER — HEPARIN SOD (PORK) LOCK FLUSH 100 UNIT/ML IV SOLN
500.0000 [IU] | Freq: Once | INTRAVENOUS | Status: AC | PRN
  Administered 2019-01-08: 500 [IU]
  Filled 2019-01-08: qty 5

## 2019-01-08 MED ORDER — SODIUM CHLORIDE 0.9% FLUSH
10.0000 mL | INTRAVENOUS | Status: DC | PRN
  Administered 2019-01-08: 10 mL
  Filled 2019-01-08: qty 10

## 2019-01-08 NOTE — Patient Instructions (Signed)

## 2019-01-08 NOTE — Assessment & Plan Note (Signed)
08/21/2018:Bilateral mastectomies: Left mastectomy: IDC grade 3, 2.1 cm, high-grade DCIS, margins negative, negative for LV I, 3/9 lymph nodes positive, ER 95% positive, PR 90% positive, HER-2 -1+, Ki-67 30% T2N1A stage IIa right mastectomy: Benign MammaPrint: High risk luminal type B Echocardiogram 09/18/2018: EF 55 to 60% Patient has been enrolled and upbeat clinical trial: No adverse effects related to the trial  Treatment plan: 1.Adjuvant chemotherapy with dose dense Adriamycin and Cytoxan x4 followed by weekly Taxol x1 discontinued due to intolerance 2.Followed by adjuvant antiestrogen therapy ------------------------------------------------------------------------------------------------------------------ Current Treatment:Antiestrogen therapy Toxicities related to prior chemotherapy: Nausea and vomiting have resolved.  Severe fatigue has improved.  Return to clinic in 3 months for survivorship care plan visit.

## 2019-01-09 ENCOUNTER — Telehealth: Payer: Self-pay | Admitting: *Deleted

## 2019-01-09 ENCOUNTER — Other Ambulatory Visit: Payer: Self-pay | Admitting: Hematology and Oncology

## 2019-01-09 LAB — FOLLICLE STIMULATING HORMONE: FSH: 80 m[IU]/mL

## 2019-01-09 MED ORDER — ANASTROZOLE 1 MG PO TABS: 1.0000 mg | ORAL_TABLET | Freq: Every day | ORAL | 3 refills | Status: DC

## 2019-01-09 NOTE — Telephone Encounter (Signed)
Copied from [place] [id number]. Topic: General - Other >> Jan 09, 2019  1:48 PM Paula Jacobs wrote: Reason for CRM: Pt called and stated that she taking too much levothyroxine and needs to be decreased. Her thyroid level was 0.161 when checked by her cancer provider. Please change and call in rx for levothyroxine/ please advise

## 2019-01-09 NOTE — Progress Notes (Signed)
I informed the patient that the [facility] is 80 which is in the menopausal range.  I prescribed her anastrozole.  I also inform her that the TSH is 0.161 and she will discuss with her primary care physician about adjusting the dosage of her thyroid medication.

## 2019-01-10 NOTE — Telephone Encounter (Signed)
Spoke with patient and she will call back later this afternoon to schedule a Doxy[employer] appointment. CRM

## 2019-01-10 NOTE — Telephone Encounter (Signed)
Pt called back to set up appt, but office was closed. Please contact pt and advise.

## 2019-01-10 NOTE — Telephone Encounter (Signed)
Can you set her up a video visit with me if she is able to see how she is doing and adust? Thanks!

## 2019-01-11 LAB — ESTRADIOL, ULTRA SENS: Estradiol, Sensitive: 9.6 pg/mL

## 2019-01-11 NOTE — Telephone Encounter (Signed)
Spoke with patient and Doxy[facility]me appointment made.

## 2019-01-14 ENCOUNTER — Telehealth: Payer: Self-pay | Admitting: *Deleted

## 2019-01-14 ENCOUNTER — Encounter: Payer: Self-pay | Admitting: *Deleted

## 2019-01-14 ENCOUNTER — Other Ambulatory Visit: Payer: Self-pay

## 2019-01-14 ENCOUNTER — Ambulatory Visit (INDEPENDENT_AMBULATORY_CARE_PROVIDER_SITE_OTHER): Payer: BLUE CROSS/BLUE SHIELD | Admitting: Family Medicine

## 2019-01-14 ENCOUNTER — Encounter: Payer: Self-pay | Admitting: Family Medicine

## 2019-01-14 DIAGNOSIS — I10 Essential (primary) hypertension: Secondary | ICD-10-CM

## 2019-01-14 DIAGNOSIS — C50912 Malignant neoplasm of unspecified site of left female breast: Secondary | ICD-10-CM

## 2019-01-14 DIAGNOSIS — E039 Hypothyroidism, unspecified: Secondary | ICD-10-CM | POA: Diagnosis not present

## 2019-01-14 MED ORDER — LEVOTHYROXINE SODIUM 150 MCG PO TABS: 150.0000 ug | ORAL_TABLET | Freq: Every day | ORAL | 3 refills | Status: DC

## 2019-01-14 MED ORDER — BLOOD PRESSURE KIT: PACK | 0 refills | Status: DC

## 2019-01-14 NOTE — Telephone Encounter (Signed)
Per office notes from 4/27, I called the pt and informed her the Rx for a BP cuff kit will be mailed to the home address, along with the pamphlet that lists numbers to call for a visit with the gynecologist.  Per pts request, I left a detailed message on her cell number with the number for [facility].

## 2019-01-14 NOTE — Telephone Encounter (Signed)
Spoke with patient regarding her xrt treatments.  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  They lost their home so she and her husband live in a hotel in [place] when she is not in [place].  She is going to check with the children's home where she works to see if she could stay at the children's home during her radiation treatments in [place].  I informed her I am checking to see if we have any resources as well as the radiation group in [place] for help with cost of an extended stay hotel there for the duration of her radiation treatments.  She states she has to work or she will be homeless.  I will follow up back up when I get more information.

## 2019-01-14 NOTE — Telephone Encounter (Signed)
Spoke with Gardiner Sleeper -manager at the [facility] about their resources for the patient.  She will find out if they have any resources for the patient.    Called [facility] about lodging for the patient, but was told she had to have a permanent address and the location had to be far away.  The representative with [employer] gave me some resources that I could look into.  I called several Set designer and was told she had to have a permanent address.  I informed Wells Guiles at [facility] of my findings and she is continuing to look at resources for her.    The patient will ask her supervisor when she goes into work on Wednesday to see if it is possible that she may be able to stay there on campus during her treatment and let me know.

## 2019-01-14 NOTE — Progress Notes (Signed)
Virtual Visit via Video Note  I connected with Paula Jacobs  on 01/14/19 at  9:00 AM EDT by a video enabled telemedicine application and verified that I am speaking with the correct person using two identifiers.  Location patient: home Location provider:work or home office Persons participating in the virtual visit: patient, provider  I discussed the limitations of evaluation and management by telemedicine and the availability of in person appointments. The patient expressed understanding and agreed to proceed.   HPI:  Paula Jacobs is a very pleasant 58 yo with a PMH hypertension, hypothyroidism and recent treatment for breast cancer here for follow up. She sees her oncology team for the breast cancer. She is s/p double mastectomy and adjuvant chemo. Recently they checked a TSH level with her labs and found it to be a bit on the low end. She has been eating healthy, exercising. Wearing mask when out. She reports she is feeling really good right now. Off chemo now and considering radiation or arimidex.  HM due: pap, colon ca screening She usually sees her gynecologist for pap smears Her last colonoscopy was in 2015 with Dr. Olevia Perches.  ROS: See pertinent positives and negatives per HPI.  Past Medical History:  Diagnosis Date  . ANEMIA-NOS 04/23/2008  . Aneurysm ([place]) 10/19/2017   Evaluated by [facility]RA 10/2017, 16mm, stable  . Anginal pain ([place])    went to ED in april 2018 c/o chest pain over last 2 months ; had EKG, CXR  and troponin negative per physician suspected musculoskeletal  ; dc'd with ibuprofen  and recc f/u with outpatient stress test; see care everywhere ED visit  ; patient denies recurrence of Chest pain since, endorses occ palpitations   . DEPRESSION 04/23/2008  . DJD (degenerative joint disease)   . Family history of breast cancer   . Hyperplastic colon polyp    x2  . Hypertension   . HYPOTHYROIDISM 10/17/2007  . Osteoarthritis   . Palpitations    freq at night;  at pre-op  states she drinks caffeine and that makes it worse; says the palpitations started after they took her thyroid   . PAT 10/27/2009   Qualifier: Diagnosis of  By: [name], MD, [name]    . Pneumonia    "in the past, havent had it in years"  . PONV (postoperative nausea and vomiting)    only after 1979 surgery  . RA (rheumatoid arthritis) ([place])    "problems in feet, hands, and knees"  . 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 SVT (supraventricular tachycardia) ([facility])    chronic   . Tubulovillous adenoma of colon     Past Surgical History:  Procedure Laterality Date  . ABDOMINAL HYSTERECTOMY    . APPENDECTOMY    . BUNIONECTOMY  10/2009 right & 02/03/10 left  . COLONOSCOPY W/ POLYPECTOMY  2015  . HAMMER TOE SURGERY     "all toes have pins, done with bunionectomy"  . INGUINAL HERNIA REPAIR    . KNEE CLOSED REDUCTION Left 12/05/2017   Procedure: CLOSED MANIPULATION LEFT KNEE;  Surgeon: [name], MD;  Location: [facility];  Service: Orthopedics;  Laterality: Left;  Marland Kitchen MASTECTOMY WITH RADIOACTIVE SEED GUIDED EXCISION AND AXILLARY SENTINEL LYMPH NODE BIOPSY Bilateral 08/21/2018   Procedure: BILATERAL SIMPLE MASTECTOMIES WITH LEFT AXILLARY RADIOACTIVE SEED GUIDED LYMPH NODE EXCISION AND LEFT AXILLARY SENTINEL LYMPH NODE BIOPSY;  Surgeon: Erroll Luna, MD;  Location: [facility];  Service: General;  Laterality: Bilateral;  . neck fusion  x3  . PORTACATH PLACEMENT Right 09/25/2018   Procedure: INSERTION PORT-A-CATH WITH ULTRASOUND ERAS PATHWAY;  Surgeon: Erroll Luna, MD;  Location: [facility];  Service: General;  Laterality: Right;  . THYROIDECTOMY  2001   for nodules  . TOE AMPUTATION  1979   6th toe removed from each foot  . TOTAL KNEE ARTHROPLASTY Left 10/21/2014   Procedure: LEFT TOTAL KNEE ARTHROPLASTY;  Surgeon: [name], MD;  [facility]: [facility];  Service: Orthopedics;  Laterality: Left;  . TOTAL KNEE ARTHROPLASTY Right 12/05/2017   Procedure: RIGHT TOTAL KNEE  ARTHROPLASTY;  Surgeon: [name], MD;  Location: [facility];  Service: Orthopedics;  Laterality: Right;  70 mins    Family History  Problem Relation Age of Onset  . Breast cancer Mother 20       metastatic to brain, died at 23  . Dementia Father        died at 45  . Heart attack Maternal Grandmother   . Stroke Maternal Grandmother   . Diabetes Maternal Grandmother   . Heart disease Maternal Grandmother   . Thyroid disease Maternal Grandmother   . COPD Maternal Aunt   . Cancer Paternal Uncle        details unk  . Esophageal cancer Neg Hx   . Rectal cancer Neg Hx   . Stomach cancer Neg Hx     SOCIAL HX: see hpi   Current Outpatient Medications:  .  acetaminophen (TYLENOL) 500 MG tablet, Take 1,000 mg by mouth every 6 (six) hours as needed for moderate pain., Disp: , Rfl:  .  benazepril-hydrochlorthiazide (LOTENSIN HCT) 10-12.5 MG tablet, Take 1 tablet by mouth daily., Disp: 90 tablet, Rfl: 0 .  diphenhydrAMINE (BENADRYL) 25 MG tablet, Take 25-50 mg by mouth every 6 (six) hours as needed for allergies., Disp: , Rfl:  .  ondansetron (ZOFRAN) 8 MG tablet, Take 1 tablet (8 mg total) by mouth every 8 (eight) hours as needed for nausea or vomiting., Disp: 30 tablet, Rfl: 0 .  levothyroxine (SYNTHROID) 150 MCG tablet, Take 1 tablet (150 mcg total) by mouth daily., Disp: 90 tablet, Rfl: 3 .  promethazine (PHENERGAN) 25 MG tablet, Take 1 tablet (25 mg total) by mouth every 6 (six) hours as needed for up to 3 days for nausea or vomiting., Disp: 12 tablet, Rfl: 0 No current facility-administered medications for this visit.   Facility-Administered Medications Ordered in Other Visits:  .  heparin lock flush 100 unit/mL, 500 Units, Intracatheter, Once PRN, Nicholas Lose, MD .  sodium chloride flush (NS) 0.9 % injection 10 mL, 10 mL, Intracatheter, PRN, Nicholas Lose, MD  EXAM:  VITALS per patient if applicable: wt 440  GENERAL: alert, oriented, appears well and in no acute  distress  HEENT: atraumatic, conjunttiva clear, no obvious abnormalities on inspection of external nose and ears  NECK: normal movements of the head and neck  LUNGS: on inspection no signs of respiratory distress, breathing rate appears normal, no obvious gross SOB, gasping or wheezing  CV: no obvious cyanosis  MS: moves all visible extremities without noticeable abnormality  PSYCH/NEURO: pleasant and cooperative, no obvious depression or anxiety, speech and thought processing grossly intact  ASSESSMENT AND PLAN:  Discussed the following assessment and plan:  Hypothyroidism, unspecified type - Plan: TSH -new rx sent -advised recheck TSH in 6-8 weeks -she has follow up/TOC with Dr. Ethlyn Gallery then  Essential hypertension -home monitoring in light of COVID 19 pandemic -she agrees to get cuff, will  have assistant sent her order so can use flex dollars  Malignant neoplasm of left female breast, unspecified estrogen receptor status, unspecified site of breast ([place]) -seeing oncology for management, undergoing radiation or arimidex soon  Morbid obesity ([place]) -lifestyle recs   I discussed the assessment and treatment plan with the patient. The patient was provided an opportunity to ask questions and all were answered. The patient agreed with the plan and demonstrated an understanding of the instructions.   The patient was advised to call back or seek an in-person evaluation if the symptoms worsen or if the condition fails to improve as anticipated.   Follow up instructions: Advised assistant Wendie Simmer to help patient arrange the following: -order for blood pressure cuff to use flex dollars, please use signature stamp -please let her know due for colonoscopy and provider her with GI number -please see if she is set up to see gyn about pap smear, update pap   [name], DO

## 2019-01-14 NOTE — Addendum Note (Signed)
Addended by: Agnes Lawrence on: 01/14/2019 10:03 AM   Modules accepted: Orders

## 2019-01-15 ENCOUNTER — Telehealth: Payer: Self-pay | Admitting: *Deleted

## 2019-01-15 ENCOUNTER — Encounter: Payer: Self-pay | Admitting: General Practice

## 2019-01-15 NOTE — Progress Notes (Signed)
[facility] CSW Progress Notes  Request received from nurse navigator, Iris Pert, to help address patient's needs for housing.  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  Left VM for patient w my contact information and encouragement to call.  Also left VM for [facility] w request to call to give further details on needs.  Awaiting return calls.   Edwyna Shell, LCSW Clinical Social Worker Phone:  [phone]

## 2019-01-15 NOTE — Telephone Encounter (Signed)
Left message to give update on lodging for patient.

## 2019-01-16 ENCOUNTER — Encounter: Payer: Self-pay | Admitting: General Practice

## 2019-01-16 NOTE — Progress Notes (Signed)
[facility] CSW Progress Notes  Spoke w patient regarding need for housing in [place] while she goes for radiation therapy near to her live in job. 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  Has been living w husband in extended stay motel in [place] - lost their rental housing unexpectedly when house was sold.  At same time of housing loss, was diagnosed w breast cancer and began treatment.  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  Explored options for housing support - sent information on [employer] that may provide grant for housing expenses and has said they will consider patient's application for support acquiring hotel stay on weeks she is not working at child care facility.  Also sent information on lodging discounts for people in medical treatment.  Explored need for help w locating suitable housing in [place] - will refer to [employer] for help w seeking new rental.  Referred by [facility].  Requested letter from oncologist/nurse navigator to document her treatment status for [facility] application.  Encouraged her to consider participating in upcoming virtual support group.  Edwyna Shell, LCSW Clinical Social Worker Phone:  [phone]

## 2019-01-17 ENCOUNTER — Encounter: Payer: Self-pay | Admitting: General Practice

## 2019-01-17 ENCOUNTER — Encounter: Payer: Self-pay | Admitting: *Deleted

## 2019-01-17 NOTE — Progress Notes (Signed)
[facility] CSW Progress Notes  Spoke w patient - she will complete [employer] application while at work in [facility], will submit her part directly to foundation as it is due tomorrow.  Asks CSW to help w submission of letter from oncologist verifying her cancer diagnosis and pathology report.  Letter must be submitted to [employer] by May 7 - nurse navigator asked to assist w obtaining letter.  Patient aware that since she is requesting help w 3 weeks of hotel stays in [place] during her treatments, she would be wise to include letter from selected hotel w rates for the 3 week stay along w her application.  Edwyna Shell, LCSW Clinical Social Worker Phone:  [phone]

## 2019-01-18 ENCOUNTER — Encounter: Payer: Self-pay | Admitting: General Practice

## 2019-01-18 NOTE — Progress Notes (Signed)
[facility] CSW Progress Notes  Application faxed to [employer], w request for help w hotel bills for out of town radiation treatments.  Edwyna Shell, LCSW Clinical Social Worker Phone:  [phone]

## 2019-01-21 ENCOUNTER — Telehealth: Payer: Self-pay | Admitting: *Deleted

## 2019-01-21 NOTE — Telephone Encounter (Signed)
Left message for return phone call with appointment in [place]

## 2019-01-23 DIAGNOSIS — Z170 Estrogen receptor positive status [ER+]: Secondary | ICD-10-CM | POA: Diagnosis not present

## 2019-01-23 DIAGNOSIS — C50412 Malignant neoplasm of upper-outer quadrant of left female breast: Secondary | ICD-10-CM | POA: Diagnosis not present

## 2019-01-26 ENCOUNTER — Other Ambulatory Visit: Payer: Self-pay | Admitting: Hematology and Oncology

## 2019-01-26 DIAGNOSIS — Z170 Estrogen receptor positive status [ER+]: Secondary | ICD-10-CM

## 2019-01-26 DIAGNOSIS — C50412 Malignant neoplasm of upper-outer quadrant of left female breast: Secondary | ICD-10-CM

## 2019-01-29 ENCOUNTER — Telehealth: Payer: Self-pay | Admitting: *Deleted

## 2019-01-29 MED ORDER — VENLAFAXINE HCL ER 37.5 MG PO CP24: 37.5000 mg | ORAL_CAPSULE | Freq: Every day | ORAL | 0 refills | Status: DC

## 2019-01-29 NOTE — Telephone Encounter (Signed)
Pt called stating she needed a refill for lorazepam to help her fall asleep at night.  Pt states she is experiencing anxiety related to being finished with treatment and the "what if's" and has also experienced a lack of appetite due to this. Pt also states she is going through menopause and is experiencing night sweats and it prevents her from getting a full nights rest.  Per Dr. Lindi Adie, prescribe pt 37.5 mg daily of Effexor and have patient follow up in 2 weeks to see if it is helping.  After two weeks if she is doing well, we can increase the Effexor to 75 mg daily.  Scheduling message also sent to have pt follow up with Wilber Bihari, NP for survivorship program.  Pt educated on Effexor and verbalized understanding to call us and let us know how she is tolerating it.

## 2019-01-30 DIAGNOSIS — C50412 Malignant neoplasm of upper-outer quadrant of left female breast: Secondary | ICD-10-CM | POA: Diagnosis not present

## 2019-01-30 DIAGNOSIS — Z170 Estrogen receptor positive status [ER+]: Secondary | ICD-10-CM | POA: Diagnosis not present

## 2019-01-31 DIAGNOSIS — Z510 Encounter for antineoplastic radiation therapy: Secondary | ICD-10-CM | POA: Diagnosis not present

## 2019-01-31 DIAGNOSIS — C50412 Malignant neoplasm of upper-outer quadrant of left female breast: Secondary | ICD-10-CM | POA: Diagnosis not present

## 2019-01-31 DIAGNOSIS — Z170 Estrogen receptor positive status [ER+]: Secondary | ICD-10-CM | POA: Diagnosis not present

## 2019-02-01 ENCOUNTER — Telehealth: Payer: Self-pay | Admitting: *Deleted

## 2019-02-01 ENCOUNTER — Telehealth: Payer: Self-pay | Admitting: Adult Health

## 2019-02-01 ENCOUNTER — Encounter: Payer: Self-pay | Admitting: General Practice

## 2019-02-01 NOTE — Progress Notes (Signed)
[facility] CSW Progress Notes  Patient and nurse navigator wondering if patient has heard from [employer] application for help w hotel bills during off site radiation treatments.  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  Will require hotel stay during times she is not on residential job setting.  CSW will call [employer] on patient's behalf.  Also spoke w patient and reminded her to complete [facility] application process for financial assistance.  PT states she will call today and email bar coded application to CSW for processing.  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   Edwyna Shell, LCSW Clinical Social Worker Phone:  [phone]

## 2019-02-01 NOTE — Telephone Encounter (Signed)
Was just informed that patient has been awarded the $1000 [facility] [place] to help with her lodging while in [place].  Spoke with patient and she is so thankful!!

## 2019-02-01 NOTE — Telephone Encounter (Signed)
Spoke with patient to follow up regarding her grant she applied for to be able to have help with lodging in [place] while getting xrt. She states she has not heard anything yet.  Her xrt is set to start 5/26.  She states she should hear something by next week.  Encouraged to continue to call with any needs or concerns.

## 2019-02-01 NOTE — Telephone Encounter (Signed)
Left message re 6/29 SCP visit. Other appointments remain the same. Schedule mailed.

## 2019-02-01 NOTE — Progress Notes (Signed)
[facility] CSW Progress Notes  Patient approved for $1000 grant to help w hotel expenses during radiation treatment, per [employer] agency, check will be mailed shortly to patient's home address.  Cancer Care application sent by secure email on patient's behalf.  Edwyna Shell, LCSW Clinical Social Worker Phone:  [phone]

## 2019-02-04 ENCOUNTER — Telehealth: Payer: Self-pay

## 2019-02-04 NOTE — Telephone Encounter (Signed)
RN returned call to patient re: concerns with medication.   Voicemail left for patient to return call.

## 2019-02-05 ENCOUNTER — Telehealth: Payer: Self-pay

## 2019-02-05 NOTE — Telephone Encounter (Signed)
RN placed call to follow up with concerns with medications.  Contact information left for return call.

## 2019-02-05 NOTE — Telephone Encounter (Signed)
RN spoke with patient - patient unable to continue to take Venlafaxine 37.5mg .  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  Pt unable to sleep, has tried OTC remedies with no effectiveness.  Pt feels her anxiety has been increasing over the past several weeks, and noticing a decrease in appetite.    RN will review with MD for further recommendations.

## 2019-02-05 NOTE — Telephone Encounter (Signed)
RN placed return call to follow up with MD recommendations.  No answer, no voicemail.

## 2019-02-06 ENCOUNTER — Other Ambulatory Visit: Payer: Self-pay

## 2019-02-06 ENCOUNTER — Ambulatory Visit (INDEPENDENT_AMBULATORY_CARE_PROVIDER_SITE_OTHER): Payer: BLUE CROSS/BLUE SHIELD | Admitting: Family Medicine

## 2019-02-06 ENCOUNTER — Telehealth: Payer: Self-pay | Admitting: Adult Health

## 2019-02-06 DIAGNOSIS — C50412 Malignant neoplasm of upper-outer quadrant of left female breast: Secondary | ICD-10-CM | POA: Diagnosis not present

## 2019-02-06 DIAGNOSIS — G4700 Insomnia, unspecified: Secondary | ICD-10-CM

## 2019-02-06 DIAGNOSIS — F419 Anxiety disorder, unspecified: Secondary | ICD-10-CM | POA: Diagnosis not present

## 2019-02-06 DIAGNOSIS — F321 Major depressive disorder, single episode, moderate: Secondary | ICD-10-CM | POA: Diagnosis not present

## 2019-02-06 DIAGNOSIS — Z170 Estrogen receptor positive status [ER+]: Secondary | ICD-10-CM | POA: Diagnosis not present

## 2019-02-06 DIAGNOSIS — Z510 Encounter for antineoplastic radiation therapy: Secondary | ICD-10-CM | POA: Diagnosis not present

## 2019-02-06 MED ORDER — SERTRALINE HCL 50 MG PO TABS: 50.0000 mg | ORAL_TABLET | Freq: Every day | ORAL | 2 refills | Status: DC

## 2019-02-06 MED ORDER — TRAZODONE HCL 50 MG PO TABS: 25.0000 mg | ORAL_TABLET | Freq: Every evening | ORAL | 2 refills | Status: DC | PRN

## 2019-02-06 NOTE — Progress Notes (Signed)
Virtual Visit via Video Note  I connected with Paula Jacobs  on 02/06/19 at  1:30 PM EDT by a video enabled telemedicine application and verified that I am speaking with the correct person using two identifiers.  Location patient: home Location provider:work or home office Persons participating in the virtual visit: patient, provider  I discussed the limitations of evaluation and management by telemedicine and the availability of in person appointments. The patient expressed understanding and agreed to proceed.   Paula Jacobs DOB: 01-16-1961 Encounter date: 02/06/2019  Last visit was [place] was 12/2018; she was referred for visit by oncologist due to anxiety, loss of appetite.  This is a 58 y.o. female who presents with No chief complaint on file.   History of present illness: Still dealing with recovering from breast cancer. Chemo made her sick; took away appetite. Has raised level of anxiety very high. No appetite, not sleeping. Just wanting to get some rest and work on getting appetite back.   Tried to take the venlafaxine but it made her too "zombie" like.   Has had issues with depression off and on for adult life. Not had anxiety before like she feels now.   Drinking ensure to try and maintain function.   States that weight is 185; pre cancer was 213.   Always thinking. Not sure about outcome of situation which stresses her. Has had double mastectomy and did chemo (had to stop because she was sick); and ready to start radiation. Ready to start radiation in a few days.   Just wants to be able to sleep.   Difficulty falling asleep, staying asleep. Was on diazepam during chemo, but wanted to stay off that if not doing chemo.   Hasn't met with counselor, but knows that she needs to.   She does have a good support system with family, but she feels like this is internal battle for her. Just never felt like this before.   Just sad all the time. Just not knowing what to  expect. She is exercising regularly. Works at [employer]. Works on 7 off 7. Stays overnight when she works. 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     Allergies  Allergen Reactions  . Compazine [Prochlorperazine Edisylate] Anxiety  . Hydrocodone-Acetaminophen Itching  . Latex Itching  . Tramadol Hcl Itching and Nausea Only   No outpatient medications have been marked as taking for the 02/06/19 encounter (Office Visit) with Caren Macadam, MD.    Review of Systems  Constitutional: Negative for chills, fatigue and fever.  Respiratory: Negative for cough, chest tightness, shortness of breath and wheezing.   Cardiovascular: Negative for chest pain, palpitations and leg swelling.  Psychiatric/Behavioral: Positive for sleep disturbance. Negative for suicidal ideas. The patient is nervous/anxious.     Objective:  There were no vitals taken for this visit.      BP Readings from Last 3 Encounters:  12/04/18 100/72  11/28/18 109/79  11/26/18 120/73   Wt Readings from Last 3 Encounters:  12/04/18 186 lb 1.6 oz (84.4 kg)  11/28/18 185 lb 14.4 oz (84.3 kg)  11/24/18 187 lb 13.3 oz (85.2 kg)    EXAM: Depression screen [facility] 2/9 02/06/2019 10/12/2017 09/05/2017 04/11/2017 08/06/2015  Decreased Interest '3 1 2 '$ 0 0  Down, Depressed, Hopeless '1 1 3 1 '$ 0  PHQ - 2 Score '4 2 5 1 '$ 0  Altered sleeping '3 2 3 '$ - -  Tired, decreased energy 0 1 0 - -  Change in appetite '3 1 2 '$ - -  Feeling bad or failure about yourself  0 1 2 - -  Trouble concentrating '1 1 2 '$ - -  Moving slowly or fidgety/restless 1 0 0 - -  Suicidal thoughts 0 0 0 - -  PHQ-9 Score '12 8 14 '$ - -  Difficult doing work/chores Somewhat difficult - - - -  Some recent data might be hidden    GENERAL: alert, oriented, appears well and in no acute distress  HEENT: atraumatic, conjunctiva clear, no obvious abnormalities on inspection of external nose and ears  NECK: normal movements of the head and neck  LUNGS: on  inspection no signs of respiratory distress, breathing rate appears normal, no obvious gross SOB, gasping or wheezing  CV: no obvious cyanosis  MS: moves all visible extremities without noticeable abnormality  PSYCH/NEURO: pleasant and cooperative. She is tearful during our discussion about mood.  She has no thoughts about hurting self.  She just wants to feel better.  Has a difficult time turning off her thoughts.  Does not feel like she is in control of her mood.   Assessment/Plan  1. Anxiety Discussed medication options for treatment.  She did not tolerate venlafaxine well.  We will try low-dose of sertraline.  We discussed that sometimes this helps people with sleep although it is not a sleep medication.  We discussed that this can help with anxiety and depression.Discussed new medication(s) today with patient. Discussed potential side effects and patient verbalized understanding. Let us know if any worsening of mood.  I also discussed that it would be okay for her to start with a half a tablet initially and make sure she is tolerating before moving on to a full tablet.  We will schedule follow-up visit in 2 weeks time to touch base on mood.  In the meanwhile, number for behavioral health will be given for her to initiate counseling appointment. - sertraline (ZOLOFT) 50 MG tablet; Take 1 tablet (50 mg total) by mouth daily.  Dispense: 30 tablet; Refill: 2  2. Depression, major, single episode, moderate ([place]) See above. - sertraline (ZOLOFT) 50 MG tablet; Take 1 tablet (50 mg total) by mouth daily.  Dispense: 30 tablet; Refill: 2  3. Insomnia, unspecified type Tolerated trazodone when she was hospitalized previously.  We will try this again in hopes that will help with sleep onset and maintenance. - traZODone (DESYREL) 50 MG tablet; Take 0.5-1 tablets (25-50 mg total) by mouth at bedtime as needed for sleep.  Dispense: 30 tablet; Refill: 2   Return in about 2 weeks (around 02/20/2019) for  Chronic condition visit.   I discussed the assessment and treatment plan with the patient. The patient was provided an opportunity to ask questions and all were answered. The patient agreed with the plan and demonstrated an understanding of the instructions.   The patient was advised to call back or seek an in-person evaluation if the symptoms worsen or if the condition fails to improve as anticipated.  I provided 30 minutes of non-face-to-face time during this encounter.   Micheline Rough, MD

## 2019-02-06 NOTE — Telephone Encounter (Signed)
Per 5/21 schedule message cancelled SCP visit. Left message for patient. Other appointments remain the same.

## 2019-02-07 ENCOUNTER — Telehealth: Payer: Self-pay | Admitting: *Deleted

## 2019-02-07 NOTE — Telephone Encounter (Signed)
I called the pt and scheduled an appt for 6/5.  Phone number of [phone] given to call [facility] for an appt.

## 2019-02-07 NOTE — Telephone Encounter (Signed)
-----   Message from Caren Macadam, MD sent at 02/06/2019  1:59 PM EDT ----- Please schedule follow up in 2 weeks time (doxy) and then please give behavioral health number for her to call and see about getting therapist.

## 2019-02-13 DIAGNOSIS — Z20828 Contact with and (suspected) exposure to other viral communicable diseases: Secondary | ICD-10-CM | POA: Diagnosis not present

## 2019-02-15 ENCOUNTER — Telehealth: Payer: Self-pay | Admitting: *Deleted

## 2019-02-15 NOTE — Telephone Encounter (Signed)
Spoke with patient to check in to see how her radiation is going and if she has all her lodging taken care of while he is in [place].  She states she had to be tested for COVID- 19 before she starts because of where she works.  She is supposed to start on 6/1 pending results of the COVID test.  Otherwise she states she is doing well.  Informed her I would check in on her in a couple of weeks to see how radiation was doing.

## 2019-02-20 ENCOUNTER — Telehealth: Payer: Self-pay | Admitting: Medical Oncology

## 2019-02-20 DIAGNOSIS — C50412 Malignant neoplasm of upper-outer quadrant of left female breast: Secondary | ICD-10-CM | POA: Diagnosis not present

## 2019-02-20 DIAGNOSIS — Z510 Encounter for antineoplastic radiation therapy: Secondary | ICD-10-CM | POA: Diagnosis not present

## 2019-02-20 DIAGNOSIS — Z170 Estrogen receptor positive status [ER+]: Secondary | ICD-10-CM | POA: Diagnosis not present

## 2019-02-20 NOTE — Telephone Encounter (Signed)
UPBEAT LVMOM with patient regarding 3- month assessment on study. Inquired with patient if she would be comfortable completing the 3 month assessment when she is in clinic on June 16th. I informed patient that I spoke with Dr. Lindi Adie and he approved of the visit, if she felt comfortable coming into the clinic to complete. Patient thanked and was asked to return call. Call-back information provided.  Maxwell Marion, RN, BSN, [facility] Clinical Research 02/20/2019 3:56 PM

## 2019-02-21 DIAGNOSIS — Z510 Encounter for antineoplastic radiation therapy: Secondary | ICD-10-CM | POA: Diagnosis not present

## 2019-02-21 DIAGNOSIS — C50412 Malignant neoplasm of upper-outer quadrant of left female breast: Secondary | ICD-10-CM | POA: Diagnosis not present

## 2019-02-21 DIAGNOSIS — Z170 Estrogen receptor positive status [ER+]: Secondary | ICD-10-CM | POA: Diagnosis not present

## 2019-02-22 ENCOUNTER — Other Ambulatory Visit: Payer: Self-pay

## 2019-02-22 ENCOUNTER — Ambulatory Visit (INDEPENDENT_AMBULATORY_CARE_PROVIDER_SITE_OTHER): Payer: BLUE CROSS/BLUE SHIELD | Admitting: Family Medicine

## 2019-02-22 DIAGNOSIS — C50412 Malignant neoplasm of upper-outer quadrant of left female breast: Secondary | ICD-10-CM | POA: Diagnosis not present

## 2019-02-22 DIAGNOSIS — Z170 Estrogen receptor positive status [ER+]: Secondary | ICD-10-CM | POA: Diagnosis not present

## 2019-02-22 DIAGNOSIS — G4700 Insomnia, unspecified: Secondary | ICD-10-CM

## 2019-02-22 DIAGNOSIS — F419 Anxiety disorder, unspecified: Secondary | ICD-10-CM

## 2019-02-22 DIAGNOSIS — Z510 Encounter for antineoplastic radiation therapy: Secondary | ICD-10-CM | POA: Diagnosis not present

## 2019-02-22 DIAGNOSIS — F321 Major depressive disorder, single episode, moderate: Secondary | ICD-10-CM

## 2019-02-22 NOTE — Progress Notes (Signed)
Virtual Visit via Video Note  I connected with Paula Jacobs  on 02/22/19 at  2:00 PM EDT by a video enabled telemedicine application and verified that I am speaking with the correct person using two identifiers.  Location patient: home Location provider:work or home office Persons participating in the virtual visit: patient, provider  I discussed the limitations of evaluation and management by telemedicine and the availability of in person appointments. The patient expressed understanding and agreed to proceed.   Paula Jacobs DOB: 1960/12/11 Encounter date: 02/22/2019  This is a 58 y.o. female who presents with No chief complaint on file.   History of present illness:  Appetite has improved, but still somewhat hit or miss.  She is getting ready for 3rd radiation treatment today. In waiting room now.  Feels a lot better overall; better with not being so emotional.  Does feel like the Zoloft has helped from the standpoint.  States that the trazodone is not helping keep her asleep although she does seem to fall asleep with it.  She has only tried 1 tablet (50 mg) in the evening.  Not noted side effects with either medication.  Feels like things are more manageable at this point.    Allergies  Allergen Reactions  . Compazine [Prochlorperazine Edisylate] Anxiety  . Hydrocodone-Acetaminophen Itching  . Latex Itching  . Tramadol Hcl Itching and Nausea Only   No outpatient medications have been marked as taking for the 02/22/19 encounter (Office Visit) with Paula Macadam, MD.    Review of Systems  Constitutional: Positive for appetite change. Negative for chills, fatigue and fever.  Respiratory: Negative for cough, chest tightness, shortness of breath and wheezing.   Cardiovascular: Negative for chest pain, palpitations and leg swelling.    Objective:  There were no vitals taken for this visit.      BP Readings from Last 3 Encounters:  12/04/18 100/72  11/28/18  109/79  11/26/18 120/73   Wt Readings from Last 3 Encounters:  12/04/18 186 lb 1.6 oz (84.4 kg)  11/28/18 185 lb 14.4 oz (84.3 kg)  11/24/18 187 lb 13.3 oz (85.2 kg)    EXAM: Video feed not working, so visit completed by phone  GENERAL: alert, oriented, appears well and in no acute distress  PSYCH/NEURO: pleasant and cooperative, no obvious depression or anxiety, speech and thought processing grossly intact   Assessment/Plan  1. Anxiety She has had some improvement with the Zoloft.  Continue at current dosing.  Let us know if any worsening of anxiety, but otherwise we will check in with her at 1 month follow-up.  2. Depression, major, single episode, moderate ([place]) Is been better since starting the Zoloft.  Continue medication at current dose.  Let me know if any concerns.  3. Insomnia, unspecified type Encouraged her to try 2 tablets of trazodone (100 mg) at bedtime tonight.  Let me know if this does not work for sleep.    Return in about 1 month (around 03/24/2019) for Chronic condition visit.   I discussed the assessment and treatment plan with the patient. The patient was provided an opportunity to ask questions and all were answered. The patient agreed with the plan and demonstrated an understanding of the instructions.   The patient was advised to call back or seek an in-person evaluation if the symptoms worsen or if the condition fails to improve as anticipated.  I provided 10 minutes of non-face-to-face time during this encounter.   Paula Rough, MD

## 2019-02-25 DIAGNOSIS — Z510 Encounter for antineoplastic radiation therapy: Secondary | ICD-10-CM | POA: Diagnosis not present

## 2019-02-25 DIAGNOSIS — Z170 Estrogen receptor positive status [ER+]: Secondary | ICD-10-CM | POA: Diagnosis not present

## 2019-02-25 DIAGNOSIS — C50412 Malignant neoplasm of upper-outer quadrant of left female breast: Secondary | ICD-10-CM | POA: Diagnosis not present

## 2019-02-26 ENCOUNTER — Telehealth: Payer: Self-pay | Admitting: *Deleted

## 2019-02-26 DIAGNOSIS — C50412 Malignant neoplasm of upper-outer quadrant of left female breast: Secondary | ICD-10-CM | POA: Diagnosis not present

## 2019-02-26 DIAGNOSIS — Z510 Encounter for antineoplastic radiation therapy: Secondary | ICD-10-CM | POA: Diagnosis not present

## 2019-02-26 DIAGNOSIS — Z170 Estrogen receptor positive status [ER+]: Secondary | ICD-10-CM | POA: Diagnosis not present

## 2019-02-26 NOTE — Telephone Encounter (Signed)
-----   Message from Caren Macadam, MD sent at 02/22/2019  2:25 PM EDT ----- Wait until next week to call (she is going in for radiation tx now); reschedule visit for 1 month CCV for [place] to touch base on mood. Thanks!

## 2019-02-26 NOTE — Telephone Encounter (Signed)
I called the pt and she stated she will call back for a follow up appt.  Patient requested a refill on Trazodone as she was advised to take 2 daily instead of 1 daily at the last visit.  Message sent to Dr Ethlyn Gallery.

## 2019-02-27 ENCOUNTER — Telehealth: Payer: Self-pay | Admitting: Medical Oncology

## 2019-02-27 ENCOUNTER — Other Ambulatory Visit: Payer: Self-pay | Admitting: Family Medicine

## 2019-02-27 DIAGNOSIS — Z510 Encounter for antineoplastic radiation therapy: Secondary | ICD-10-CM | POA: Diagnosis not present

## 2019-02-27 DIAGNOSIS — G4700 Insomnia, unspecified: Secondary | ICD-10-CM

## 2019-02-27 DIAGNOSIS — Z170 Estrogen receptor positive status [ER+]: Secondary | ICD-10-CM | POA: Diagnosis not present

## 2019-02-27 DIAGNOSIS — C50412 Malignant neoplasm of upper-outer quadrant of left female breast: Secondary | ICD-10-CM | POA: Diagnosis not present

## 2019-02-27 MED ORDER — TRAZODONE HCL 100 MG PO TABS: 100.0000 mg | ORAL_TABLET | Freq: Every evening | ORAL | 1 refills | Status: DC | PRN

## 2019-02-27 NOTE — Telephone Encounter (Signed)
I called the pt and informed her of the message below

## 2019-02-27 NOTE — Telephone Encounter (Signed)
UPBEAT LVMOM with patient regarding 3 month assessment visit. Asked patient to return call. Return contact information provided.  Maxwell Marion, RN, BSN, [facility] Clinical [facility] 02/27/2019 4:50 PM

## 2019-02-27 NOTE — Telephone Encounter (Signed)
Ok; 100mg  tablets were sent in.

## 2019-02-28 DIAGNOSIS — Z510 Encounter for antineoplastic radiation therapy: Secondary | ICD-10-CM | POA: Diagnosis not present

## 2019-02-28 DIAGNOSIS — Z170 Estrogen receptor positive status [ER+]: Secondary | ICD-10-CM | POA: Diagnosis not present

## 2019-02-28 DIAGNOSIS — C50412 Malignant neoplasm of upper-outer quadrant of left female breast: Secondary | ICD-10-CM | POA: Diagnosis not present

## 2019-03-01 ENCOUNTER — Telehealth: Payer: Self-pay | Admitting: *Deleted

## 2019-03-01 DIAGNOSIS — Z510 Encounter for antineoplastic radiation therapy: Secondary | ICD-10-CM | POA: Diagnosis not present

## 2019-03-01 DIAGNOSIS — C50412 Malignant neoplasm of upper-outer quadrant of left female breast: Secondary | ICD-10-CM | POA: Diagnosis not present

## 2019-03-01 DIAGNOSIS — Z170 Estrogen receptor positive status [ER+]: Secondary | ICD-10-CM | POA: Diagnosis not present

## 2019-03-01 NOTE — Telephone Encounter (Signed)
Left message for a return phone call to follow and see if she has started her radiation in [place].

## 2019-03-04 DIAGNOSIS — C50412 Malignant neoplasm of upper-outer quadrant of left female breast: Secondary | ICD-10-CM | POA: Diagnosis not present

## 2019-03-04 DIAGNOSIS — Z510 Encounter for antineoplastic radiation therapy: Secondary | ICD-10-CM | POA: Diagnosis not present

## 2019-03-04 DIAGNOSIS — Z170 Estrogen receptor positive status [ER+]: Secondary | ICD-10-CM | POA: Diagnosis not present

## 2019-03-05 ENCOUNTER — Inpatient Hospital Stay: Payer: Self-pay | Attending: Hematology and Oncology

## 2019-03-05 DIAGNOSIS — Z452 Encounter for adjustment and management of vascular access device: Secondary | ICD-10-CM | POA: Insufficient documentation

## 2019-03-05 DIAGNOSIS — C50412 Malignant neoplasm of upper-outer quadrant of left female breast: Secondary | ICD-10-CM | POA: Diagnosis not present

## 2019-03-05 DIAGNOSIS — Z170 Estrogen receptor positive status [ER+]: Secondary | ICD-10-CM | POA: Diagnosis not present

## 2019-03-05 DIAGNOSIS — Z510 Encounter for antineoplastic radiation therapy: Secondary | ICD-10-CM | POA: Diagnosis not present

## 2019-03-06 DIAGNOSIS — Z170 Estrogen receptor positive status [ER+]: Secondary | ICD-10-CM | POA: Diagnosis not present

## 2019-03-06 DIAGNOSIS — Z510 Encounter for antineoplastic radiation therapy: Secondary | ICD-10-CM | POA: Diagnosis not present

## 2019-03-06 DIAGNOSIS — C50412 Malignant neoplasm of upper-outer quadrant of left female breast: Secondary | ICD-10-CM | POA: Diagnosis not present

## 2019-03-07 ENCOUNTER — Telehealth: Payer: Self-pay | Admitting: Family Medicine

## 2019-03-07 ENCOUNTER — Other Ambulatory Visit: Payer: Self-pay | Admitting: *Deleted

## 2019-03-07 DIAGNOSIS — Z510 Encounter for antineoplastic radiation therapy: Secondary | ICD-10-CM | POA: Diagnosis not present

## 2019-03-07 DIAGNOSIS — C50412 Malignant neoplasm of upper-outer quadrant of left female breast: Secondary | ICD-10-CM | POA: Diagnosis not present

## 2019-03-07 DIAGNOSIS — Z170 Estrogen receptor positive status [ER+]: Secondary | ICD-10-CM | POA: Diagnosis not present

## 2019-03-07 DIAGNOSIS — I10 Essential (primary) hypertension: Secondary | ICD-10-CM

## 2019-03-07 MED ORDER — BENAZEPRIL-HYDROCHLOROTHIAZIDE 10-12.5 MG PO TABS: 1.0000 | ORAL_TABLET | Freq: Every day | ORAL | 0 refills | Status: DC

## 2019-03-07 NOTE — Telephone Encounter (Signed)
Rx refilled for 30 days. 90 day supply can be done at upcoming visit

## 2019-03-07 NOTE — Telephone Encounter (Signed)
Medication Refill - Medication: benazepril-hydrochlorthiazide (LOTENSIN HCT) 10-12.5 MG tablet    Patient called to request a refill for the above medication.  Patient is almost completely out of medication  Preferred Pharmacy (with phone number or street name):  [id number] - [place], [place] - [address] [phone] (Phone) [phone] (Fax)

## 2019-03-08 DIAGNOSIS — Z170 Estrogen receptor positive status [ER+]: Secondary | ICD-10-CM | POA: Diagnosis not present

## 2019-03-08 DIAGNOSIS — C50412 Malignant neoplasm of upper-outer quadrant of left female breast: Secondary | ICD-10-CM | POA: Diagnosis not present

## 2019-03-08 DIAGNOSIS — Z510 Encounter for antineoplastic radiation therapy: Secondary | ICD-10-CM | POA: Diagnosis not present

## 2019-03-11 ENCOUNTER — Other Ambulatory Visit: Payer: Self-pay

## 2019-03-11 ENCOUNTER — Telehealth: Payer: Self-pay | Admitting: *Deleted

## 2019-03-11 ENCOUNTER — Ambulatory Visit (INDEPENDENT_AMBULATORY_CARE_PROVIDER_SITE_OTHER): Payer: Self-pay | Admitting: Family Medicine

## 2019-03-11 ENCOUNTER — Encounter: Payer: Self-pay | Admitting: Family Medicine

## 2019-03-11 DIAGNOSIS — Z170 Estrogen receptor positive status [ER+]: Secondary | ICD-10-CM | POA: Diagnosis not present

## 2019-03-11 DIAGNOSIS — Z510 Encounter for antineoplastic radiation therapy: Secondary | ICD-10-CM | POA: Diagnosis not present

## 2019-03-11 DIAGNOSIS — C50412 Malignant neoplasm of upper-outer quadrant of left female breast: Secondary | ICD-10-CM | POA: Diagnosis not present

## 2019-03-11 DIAGNOSIS — F419 Anxiety disorder, unspecified: Secondary | ICD-10-CM

## 2019-03-11 DIAGNOSIS — G4700 Insomnia, unspecified: Secondary | ICD-10-CM

## 2019-03-11 NOTE — Telephone Encounter (Signed)
Patient called back and was informed of the message below.  Patient stated she would prefer to call the office and agreed to call back in 2 weeks with further information.

## 2019-03-11 NOTE — Progress Notes (Signed)
Virtual Visit via Video Note  I connected with Paula Jacobs on 03/11/19 at 11:30 AM EDT by a video enabled telemedicine application and verified that I am speaking with the correct person using two identifiers.  Location patient: home Location provider:work or home office Persons participating in the virtual visit: patient, provider  I discussed the limitations of evaluation and management by telemedicine and the availability of in person appointments. The patient expressed understanding and agreed to proceed.   Paula Jacobs DOB: 04-04-1961 Encounter date: 03/11/2019  This is a 58 y.o. female who presents with Chief Complaint  Patient presents with  . Establish Care    History of present illness: Doing a lot better. Has appetite back - some days better than others. Not going all day without eating like she was doing before.   Trazodone is fine; starting to make her sleepier than it did in beginning. Not sure about the zoloft. Just makes her feel a little fuzzy headed. Careful about what she takes when she is at work - just likes to make sure she is 100%. Tried melatonin which would make her sleepy but she wouldn't stay asleep.   Wants to get her port taken out.   Allergies  Allergen Reactions  . Compazine [Prochlorperazine Edisylate] Anxiety  . Hydrocodone-Acetaminophen Itching  . Latex Itching  . Tramadol Hcl Itching and Nausea Only   Current Meds  Medication Sig  . acetaminophen (TYLENOL) 500 MG tablet Take 1,000 mg by mouth every 6 (six) hours as needed for moderate pain.  . benazepril-hydrochlorthiazide (LOTENSIN HCT) 10-12.5 MG tablet Take 1 tablet by mouth daily.  . Blood Pressure KIT Use as directed  . diphenhydrAMINE (BENADRYL) 25 MG tablet Take 25-50 mg by mouth every 6 (six) hours as needed for allergies.  Marland Kitchen levothyroxine (SYNTHROID) 150 MCG tablet Take 1 tablet (150 mcg total) by mouth daily.  . ondansetron (ZOFRAN) 8 MG tablet Take 1 tablet (8 mg total)  by mouth every 8 (eight) hours as needed for nausea or vomiting.  . sertraline (ZOLOFT) 50 MG tablet Take 1 tablet (50 mg total) by mouth daily.  . traZODone (DESYREL) 100 MG tablet Take 1 tablet (100 mg total) by mouth at bedtime as needed for sleep.    Review of Systems  Constitutional: Positive for fatigue (starting to notice some fatigue). Negative for chills and fever.  Respiratory: Negative for cough, chest tightness, shortness of breath and wheezing.   Cardiovascular: Negative for chest pain, palpitations and leg swelling.    Objective:  There were no vitals taken for this visit.      BP Readings from Last 3 Encounters:  12/04/18 100/72  11/28/18 109/79  11/26/18 120/73   Wt Readings from Last 3 Encounters:  12/04/18 186 lb 1.6 oz (84.4 kg)  11/28/18 185 lb 14.4 oz (84.3 kg)  11/24/18 187 lb 13.3 oz (85.2 kg)    EXAM:  GENERAL: alert, oriented, appears well and in no acute distress  PSYCH/NEURO: pleasant and cooperative, no obvious depression or anxiety, speech and thought processing grossly intact  Video component stopped working; minimal exam  Assessment/Plan  1. Anxiety Has been improved with zoloft, but medication makes her feel slightly foggy headed.  Advised trying half a tablet (25 mg) of Zoloft.  Okay to take in the evening, as this may help with daytime symptoms.  Update me in 2 weeks time as to progress with this.  2. Insomnia, unspecified type Initially was not sleeping well with just 50  mg of trazodone, but is noticed increased sleepiness with taking 100 mg.  Advised that this is a medication she does not have to take every night.  She can certainly go back to doing 50 mg as this may help with daytime "fogginess".   our visit was cut off. Will follow up by phone to make sure all questions answered. We will  Have her update [place] in 2 weeks time with above changes and discuss at that point whether further changes needed.   Of noted; she has tried to be in  contact with surgeon for port removal/flushing. Didn't hear back. She was going to call again and will let me know if still having hard time connecting with them. She has no transportation so is "stuck" out at work because she is getting daily radiation and this is closer to radiation site for her. She worries about having port that is unflushed/infection risk.   I discussed the assessment and treatment plan with the patient. The patient was provided an opportunity to ask questions and all were answered. The patient agreed with the plan and demonstrated an understanding of the instructions.   The patient was advised to call back or seek an in-person evaluation if the symptoms worsen or if the condition fails to improve as anticipated.  I provided 25 minutes of non-face-to-face time during this encounter.   Micheline Rough, MD

## 2019-03-11 NOTE — Telephone Encounter (Signed)
-----   Message from Caren Macadam, MD sent at 03/11/2019 12:04 PM EDT ----- We lost our connection. I was going to message through mychart but doesn't look like she is set up. Plan is to try 1/2 tab of the zoloft in evening. Update me through mychart in about 2 weeks. Let me know if can't get in touch with surgeon regarding port concerns. OK to send her mychart link to set up if she desires. Thanks!

## 2019-03-11 NOTE — Telephone Encounter (Signed)
I left a message for the pt to return my call. 

## 2019-03-12 DIAGNOSIS — Z510 Encounter for antineoplastic radiation therapy: Secondary | ICD-10-CM | POA: Diagnosis not present

## 2019-03-12 DIAGNOSIS — Z170 Estrogen receptor positive status [ER+]: Secondary | ICD-10-CM | POA: Diagnosis not present

## 2019-03-12 DIAGNOSIS — C50412 Malignant neoplasm of upper-outer quadrant of left female breast: Secondary | ICD-10-CM | POA: Diagnosis not present

## 2019-03-13 DIAGNOSIS — C50412 Malignant neoplasm of upper-outer quadrant of left female breast: Secondary | ICD-10-CM | POA: Diagnosis not present

## 2019-03-13 DIAGNOSIS — Z170 Estrogen receptor positive status [ER+]: Secondary | ICD-10-CM | POA: Diagnosis not present

## 2019-03-13 DIAGNOSIS — Z510 Encounter for antineoplastic radiation therapy: Secondary | ICD-10-CM | POA: Diagnosis not present

## 2019-03-14 ENCOUNTER — Inpatient Hospital Stay: Payer: Self-pay

## 2019-03-14 ENCOUNTER — Other Ambulatory Visit: Payer: Self-pay

## 2019-03-14 DIAGNOSIS — C50412 Malignant neoplasm of upper-outer quadrant of left female breast: Secondary | ICD-10-CM | POA: Diagnosis not present

## 2019-03-14 DIAGNOSIS — Z510 Encounter for antineoplastic radiation therapy: Secondary | ICD-10-CM | POA: Diagnosis not present

## 2019-03-14 DIAGNOSIS — Z95828 Presence of other vascular implants and grafts: Secondary | ICD-10-CM

## 2019-03-14 DIAGNOSIS — Z170 Estrogen receptor positive status [ER+]: Secondary | ICD-10-CM | POA: Diagnosis not present

## 2019-03-14 MED ORDER — SODIUM CHLORIDE 0.9% FLUSH
10.0000 mL | INTRAVENOUS | Status: DC | PRN
  Administered 2019-03-14: 10 mL
  Filled 2019-03-14: qty 10

## 2019-03-14 MED ORDER — HEPARIN SOD (PORK) LOCK FLUSH 100 UNIT/ML IV SOLN
500.0000 [IU] | Freq: Once | INTRAVENOUS | Status: AC | PRN
  Administered 2019-03-14: 500 [IU]
  Filled 2019-03-14: qty 5

## 2019-03-15 DIAGNOSIS — Z170 Estrogen receptor positive status [ER+]: Secondary | ICD-10-CM | POA: Diagnosis not present

## 2019-03-15 DIAGNOSIS — C50412 Malignant neoplasm of upper-outer quadrant of left female breast: Secondary | ICD-10-CM | POA: Diagnosis not present

## 2019-03-15 DIAGNOSIS — Z510 Encounter for antineoplastic radiation therapy: Secondary | ICD-10-CM | POA: Diagnosis not present

## 2019-03-18 ENCOUNTER — Encounter: Payer: BLUE CROSS/BLUE SHIELD | Admitting: Adult Health

## 2019-03-18 DIAGNOSIS — Z170 Estrogen receptor positive status [ER+]: Secondary | ICD-10-CM | POA: Diagnosis not present

## 2019-03-18 DIAGNOSIS — C50412 Malignant neoplasm of upper-outer quadrant of left female breast: Secondary | ICD-10-CM | POA: Diagnosis not present

## 2019-03-18 DIAGNOSIS — Z510 Encounter for antineoplastic radiation therapy: Secondary | ICD-10-CM | POA: Diagnosis not present

## 2019-03-19 ENCOUNTER — Telehealth: Payer: Self-pay | Admitting: Medical Oncology

## 2019-03-19 DIAGNOSIS — Z510 Encounter for antineoplastic radiation therapy: Secondary | ICD-10-CM | POA: Diagnosis not present

## 2019-03-19 DIAGNOSIS — Z170 Estrogen receptor positive status [ER+]: Secondary | ICD-10-CM | POA: Diagnosis not present

## 2019-03-19 DIAGNOSIS — C50412 Malignant neoplasm of upper-outer quadrant of left female breast: Secondary | ICD-10-CM | POA: Diagnosis not present

## 2019-03-19 NOTE — Telephone Encounter (Signed)
UPBEAT LVMOM with patient again, regarding that the 8-month assessments for the study are due. Informed patient that I have called several times and LVM with her and have not heard back. Inquired with patient if she was still interested in participating with study and if not that this is perfectly fine, we understand life changes. I asked patient if she would return my call and inform me of her wishes of whether she would like to continue with study or if she does not wish to participate any longer. Patient was again informed of what the 3 month assessment collections are, including a cardiac MRI, should she wish to continue. Patient thanked for her time. Contact information provided.  Maxwell Marion, RN, BSN, [facility] Clinical Research 03/19/2019 3:24 PM

## 2019-03-20 DIAGNOSIS — C50412 Malignant neoplasm of upper-outer quadrant of left female breast: Secondary | ICD-10-CM | POA: Diagnosis not present

## 2019-03-20 DIAGNOSIS — Z170 Estrogen receptor positive status [ER+]: Secondary | ICD-10-CM | POA: Diagnosis not present

## 2019-03-20 DIAGNOSIS — Z510 Encounter for antineoplastic radiation therapy: Secondary | ICD-10-CM | POA: Diagnosis not present

## 2019-03-21 DIAGNOSIS — C50412 Malignant neoplasm of upper-outer quadrant of left female breast: Secondary | ICD-10-CM | POA: Diagnosis not present

## 2019-03-21 DIAGNOSIS — Z510 Encounter for antineoplastic radiation therapy: Secondary | ICD-10-CM | POA: Diagnosis not present

## 2019-03-21 DIAGNOSIS — Z170 Estrogen receptor positive status [ER+]: Secondary | ICD-10-CM | POA: Diagnosis not present

## 2019-03-23 DIAGNOSIS — Z170 Estrogen receptor positive status [ER+]: Secondary | ICD-10-CM | POA: Diagnosis not present

## 2019-03-23 DIAGNOSIS — C50412 Malignant neoplasm of upper-outer quadrant of left female breast: Secondary | ICD-10-CM | POA: Diagnosis not present

## 2019-03-23 DIAGNOSIS — Z510 Encounter for antineoplastic radiation therapy: Secondary | ICD-10-CM | POA: Diagnosis not present

## 2019-03-25 DIAGNOSIS — Z170 Estrogen receptor positive status [ER+]: Secondary | ICD-10-CM | POA: Diagnosis not present

## 2019-03-25 DIAGNOSIS — C50412 Malignant neoplasm of upper-outer quadrant of left female breast: Secondary | ICD-10-CM | POA: Diagnosis not present

## 2019-03-25 DIAGNOSIS — Z510 Encounter for antineoplastic radiation therapy: Secondary | ICD-10-CM | POA: Diagnosis not present

## 2019-03-26 ENCOUNTER — Telehealth: Payer: Self-pay | Admitting: *Deleted

## 2019-03-26 DIAGNOSIS — Z170 Estrogen receptor positive status [ER+]: Secondary | ICD-10-CM | POA: Diagnosis not present

## 2019-03-26 DIAGNOSIS — Z510 Encounter for antineoplastic radiation therapy: Secondary | ICD-10-CM | POA: Diagnosis not present

## 2019-03-26 DIAGNOSIS — C50412 Malignant neoplasm of upper-outer quadrant of left female breast: Secondary | ICD-10-CM | POA: Diagnosis not present

## 2019-03-26 NOTE — Telephone Encounter (Signed)
Left message for a return phone call to follow up regarding patient's xrt treatments in [place].

## 2019-03-27 DIAGNOSIS — C50412 Malignant neoplasm of upper-outer quadrant of left female breast: Secondary | ICD-10-CM | POA: Diagnosis not present

## 2019-03-27 DIAGNOSIS — Z170 Estrogen receptor positive status [ER+]: Secondary | ICD-10-CM | POA: Diagnosis not present

## 2019-03-27 DIAGNOSIS — Z510 Encounter for antineoplastic radiation therapy: Secondary | ICD-10-CM | POA: Diagnosis not present

## 2019-03-28 DIAGNOSIS — Z510 Encounter for antineoplastic radiation therapy: Secondary | ICD-10-CM | POA: Diagnosis not present

## 2019-03-28 DIAGNOSIS — Z170 Estrogen receptor positive status [ER+]: Secondary | ICD-10-CM | POA: Diagnosis not present

## 2019-03-28 DIAGNOSIS — C50412 Malignant neoplasm of upper-outer quadrant of left female breast: Secondary | ICD-10-CM | POA: Diagnosis not present

## 2019-03-29 ENCOUNTER — Ambulatory Visit: Payer: Self-pay | Admitting: Surgery

## 2019-03-29 DIAGNOSIS — C50412 Malignant neoplasm of upper-outer quadrant of left female breast: Secondary | ICD-10-CM | POA: Diagnosis not present

## 2019-03-29 DIAGNOSIS — Z170 Estrogen receptor positive status [ER+]: Secondary | ICD-10-CM | POA: Diagnosis not present

## 2019-03-29 DIAGNOSIS — Z853 Personal history of malignant neoplasm of breast: Secondary | ICD-10-CM | POA: Diagnosis not present

## 2019-03-29 DIAGNOSIS — Z510 Encounter for antineoplastic radiation therapy: Secondary | ICD-10-CM | POA: Diagnosis not present

## 2019-03-29 NOTE — H&P (Signed)
[name] Documented: 03/29/2019 10:04 AM Location: [facility] Patient #: [id number] DOB: 09-20-1960 Married / Language: English / Race: Black or African American Female  History of Present Illness Marcello Moores A. Cornett MD; 03/29/2019 11:55 AM) Patient words: Patient returns for six-month follow-up for bilateral simple mastectomy with left axillary lymph node mapping in her lymph node biopsy. She is completing radiation therapy and [place]. She does have a Port-A-Cath in place and stage, chemotherapy. She is somewhat burned from radiation therapy but overall has done well.                     Diagnosis 1. Lymph node, biopsy, left axillary with radioactive seed - LYMPH NODE, NEGATIVE FOR CARCINOMA (0/1). 2. Breast, simple mastectomy, right - BENIGN BREAST TISSUE WITH MILD FIBROCYSTIC CHANGE AND RARE CALCIFICATIONS. - NEGATIVE FOR CARCINOMA. 3. Breast, simple mastectomy, left - INVASIVE DUCTAL CARCINOMA WITH CALCIFICATIONS, GRADE 3, 2.1 CM. - DUCTAL CARCINOMA IN SITU, INTERMEDIATE TO HIGH NUCLEAR GRADE. - RESECTION MARGINS ARE NEGATIVE FOR CARCINOMA. - NEGATIVE FOR LYMPHOVASCULAR OR PERINEURAL INVASION. - METASTATIC CARCINOMA TO ONE INTRAMAMMARY LYMPH NODE (1/1/). - METASTATIC CARCINOMA MEASURES 0.9 CM IN GREATEST DIMENSION. - BIOPSY SITE CHANGES. - SEE ONCOLOGY TABLE. 4. Lymph nodes, regional resection, left axillary - METASTATIC CARCINOMA TO TWO OF EIGHT LYMPH NODES (2/8). Microscopic Comment 3. INVASIVE CARCINOMA OF THE BREAST: Resection Procedure: Simple mastectomy. Specimen Laterality: Left. Tumor Size: 2.1 cm. Histologic Type: Invasive ductal carcinoma. Histologic Grade: Glandular (Acinar)/Tubular Differentiation: 3. Nuclear Pleomorphism: 3. Mitotic Rate: 3. Overall Grade: 3. Ductal Carcinoma In Situ: Present, intermediate to high nuclear grade. Tumor Extension: Not applicable. (required only if the structures are present and involved)  (select all that apply) 1 of 3 FINAL for MARIENE, DICKERMAN ([id number]) Microscopic Comment(continued) Margins: Distance from closest margin (millimeters): Greater than 10 mm. DCIS Margins: (required only if DCIS is present in specimen) Distance from closest margin (millimeters): Greater than 10 mm. Regional Lymph Nodes: Number of Lymph Nodes Examined: 9. Number of Sentinel Nodes Examined (if applicable): 0. Number of Lymph Nodes with Macrometastases (>2 mm): 3. Number of Lymph Nodes with Micrometastases: 0. Number of Lymph Nodes with Isolated Tumor Cells (?0.2 mm or ?200 cells)#: 0. Size of Largest Metastatic Deposit (millimeters): 9 mm. Extranodal Extension: Not identified. Treatment Effect: No known presurgical therapy. Breast Biomarker Testing Performed on Previous Biopsy: Yes. Testing Performed on Case Number: [id number]. Estrogen Receptor: 95%, strong staining intensity. Progesterone Receptor: 90%, strong staining intensity. HER2: Negative (1+). Ki-67: 30%. Representative tumor block: 3A. Pathologic Stage Classification (pTNM, AJCC 8th Edition): pT2, pN1a. (v4.2.0.0) Nilesh.  The patient is a 58 year old female.   Allergies (Tanisha A. Owens Shark, [place]; 03/29/2019 10:04 AM) HYDROcodone-Acetaminophen *ANALGESICS - OPIOID* Itching. Latex Exam Gloves *MEDICAL DEVICES AND SUPPLIES* Itching. traMADol HCl *ANALGESICS - OPIOID* Itching, Nausea. Allergies Reconciled  Medication History (Tanisha A. Owens Shark, [place]; 03/29/2019 10:05 AM) Benazepril-hydroCHLOROthiazide (10-12.5MG Tablet, Oral) Active. LORazepam (1MG Tablet, Oral) Active. Prochlorperazine Maleate (10MG Tablet, Oral) Active. Albuterol (90MCG/ACT Aerosol Soln, Inhalation) Active. Levothyroxine Sodium (175MCG Tablet, Oral) Active. Medications Reconciled    Vitals (Tanisha A. Brown RMA; 03/29/2019 10:04 AM) 03/29/2019 10:04 AM Weight: 185 lb Height: 62in Body Surface Area: 1.85 m Body Mass Index: 33.84  kg/m  Temp.: 97.4F  Pulse: 84 (Regular)  BP: 126/74 (Sitting, Left Arm, Standard)        Physical Exam (Thomas A. Cornett MD; 03/29/2019 11:56 AM)  General Mental Status-Alert. General Appearance-Consistent with stated age. Hydration-Well hydrated. Voice-Normal.  Breast Note: Both breasts. Left Chest Status Post Radiation Burning No Significant Skin Breakdown. Mild Left Arm Lymphedema Right Chest Wall Isn't Was Normal.  Musculoskeletal Normal Exam - Left-Upper Extremity Strength Normal and Lower Extremity Strength Normal. Normal Exam - Right-Upper Extremity Strength Normal, Lower Extremity Weakness.    Assessment & Plan (Ranita Stjulien A. Kimya Mccahill MD; 03/29/2019 11:57 AM)  HISTORY OF BREAST CANCER (Z85.3) Impression: Arrange for port removal near future. Referral for lymphedema management to physical therapy  Current Plans I recommended surgery to remove the catheter. I explained the technique of removal with use of local anesthesia & possible need for more aggressive sedation/anesthesia for patient comfort.  Risks such as bleeding, infection, and other risks were discussed. Post-operative dressing/incision care was discussed. I noted a good likelihood this will help address the problem. We will work to minimize complications. Questions were answered. The patient expresses understanding & wishes to proceed with surgery.  Pt Education - [facility] Free Text Education/Instructions: discussed with patient and provided information.

## 2019-03-30 ENCOUNTER — Other Ambulatory Visit: Payer: Self-pay | Admitting: Family Medicine

## 2019-03-30 DIAGNOSIS — I10 Essential (primary) hypertension: Secondary | ICD-10-CM

## 2019-04-01 DIAGNOSIS — Z170 Estrogen receptor positive status [ER+]: Secondary | ICD-10-CM | POA: Diagnosis not present

## 2019-04-01 DIAGNOSIS — C50412 Malignant neoplasm of upper-outer quadrant of left female breast: Secondary | ICD-10-CM | POA: Diagnosis not present

## 2019-04-01 DIAGNOSIS — Z510 Encounter for antineoplastic radiation therapy: Secondary | ICD-10-CM | POA: Diagnosis not present

## 2019-04-02 ENCOUNTER — Telehealth: Payer: Self-pay | Admitting: *Deleted

## 2019-04-02 DIAGNOSIS — Z510 Encounter for antineoplastic radiation therapy: Secondary | ICD-10-CM | POA: Diagnosis not present

## 2019-04-02 DIAGNOSIS — Z170 Estrogen receptor positive status [ER+]: Secondary | ICD-10-CM | POA: Diagnosis not present

## 2019-04-02 DIAGNOSIS — C50412 Malignant neoplasm of upper-outer quadrant of left female breast: Secondary | ICD-10-CM | POA: Diagnosis not present

## 2019-04-02 NOTE — Telephone Encounter (Signed)
Left message for a return phone call to check in with patient.

## 2019-04-03 ENCOUNTER — Telehealth: Payer: Self-pay | Admitting: Hematology and Oncology

## 2019-04-03 NOTE — Telephone Encounter (Signed)
Scheduled appt per 7/15 sch message - unable to reach pt . Left message with appt date and time

## 2019-04-10 NOTE — Assessment & Plan Note (Deleted)
08/21/2018:Bilateral mastectomies: Left mastectomy: IDC grade 3, 2.1 cm, high-grade DCIS, margins negative, negative for LV I, 3/9 lymph nodes positive, ER 95% positive, PR 90% positive, HER-2 -1+, Ki-67 30% T2N1A stage IIa right mastectomy: Benign MammaPrint: High risk luminal type B Echocardiogram 09/18/2018: EF 55 to 60% Patient has been enrolled and upbeat clinical trial: No adverse effects related to the trial  Treatment plan: 1.Adjuvant chemotherapy with dose dense Adriamycin and Cytoxan x4 followed by weekly Taxol x1 discontinued due to intolerance 2.Followed by adjuvant antiestrogen therapy ------------------------------------------------------------------------------------------------------------------ Current Treatment:Antiestrogen therapy with anastrozole started 01/09/2019 when she was noted to be menopausal  Anastrozole toxicities:  Breast cancer surveillance:  Return to clinic in 1 year for follow-up

## 2019-04-15 ENCOUNTER — Inpatient Hospital Stay: Payer: BC Managed Care – PPO | Attending: Hematology and Oncology | Admitting: Hematology and Oncology

## 2019-04-18 ENCOUNTER — Telehealth: Payer: Self-pay | Admitting: *Deleted

## 2019-04-18 NOTE — Telephone Encounter (Signed)
Left message for a return phone call concerning missed appointment on 7/27.

## 2019-04-22 ENCOUNTER — Telehealth: Payer: Self-pay | Admitting: Hematology and Oncology

## 2019-04-22 NOTE — Telephone Encounter (Signed)
Scheduled appt per 7/31 sch message - pt aware of appt date and time for f/u on 8/11

## 2019-04-24 NOTE — Assessment & Plan Note (Deleted)
08/21/2018:Bilateral mastectomies: Left mastectomy: IDC grade 3, 2.1 cm, high-grade DCIS, margins negative, negative for LV I, 3/9 lymph nodes positive, ER 95% positive, PR 90% positive, HER-2 -1+, Ki-67 30% T2N1A stage IIa right mastectomy: Benign MammaPrint: High risk luminal type B Echocardiogram 09/18/2018: EF 55 to 60% Patient has been enrolled and upbeat clinical trial: No adverse effects related to the trial  Treatment plan: 1.Adjuvant chemotherapy with dose dense Adriamycin and Cytoxan x4 followed by weekly Taxol x1 discontinued due to intolerance 2.  Adjuvant radiation at [facility] 3.Followed by adjuvant antiestrogen therapy ------------------------------------------------------------------------------------------------------------------ Current Treatment:Antiestrogen therapy  with anastrozole  Anastrozole toxicities:  Return to clinic in 6 months for follow-up

## 2019-04-30 ENCOUNTER — Inpatient Hospital Stay: Payer: BC Managed Care – PPO

## 2019-04-30 ENCOUNTER — Inpatient Hospital Stay: Payer: BC Managed Care – PPO | Admitting: Hematology and Oncology

## 2019-05-01 ENCOUNTER — Telehealth: Payer: Self-pay | Admitting: *Deleted

## 2019-05-01 NOTE — Telephone Encounter (Signed)
Left message for a return phone call about her missed appt on 8/11. Schedule msg sent to reschedule.

## 2019-05-02 ENCOUNTER — Other Ambulatory Visit: Payer: Self-pay | Admitting: Family Medicine

## 2019-05-02 ENCOUNTER — Telehealth: Payer: Self-pay | Admitting: Hematology and Oncology

## 2019-05-02 DIAGNOSIS — F321 Major depressive disorder, single episode, moderate: Secondary | ICD-10-CM

## 2019-05-02 DIAGNOSIS — F419 Anxiety disorder, unspecified: Secondary | ICD-10-CM

## 2019-05-02 NOTE — Progress Notes (Signed)
Patient Care Team: Caren Macadam, MD as PCP - General (Family Medicine) Hurley Cisco, MD as Consulting Physician (Rheumatology) [name], MD as Consulting Physician (Orthopedic Surgery) Erroll Luna, MD as Consulting Physician (General Surgery) Nicholas Lose, MD as Consulting Physician (Hematology and Oncology) Kyung Rudd, MD as Consulting Physician (Radiation Oncology) Maxwell Marion, RN as Registered Nurse (Medical Oncology)  DIAGNOSIS:    ICD-10-CM   1. Malignant neoplasm of upper-outer quadrant of left breast in female, estrogen receptor positive ([place])  C50.412    Z17.0     SUMMARY OF ONCOLOGIC HISTORY: Oncology History  Malignant neoplasm of upper-outer quadrant of left breast in female, estrogen receptor positive ([place])  07/13/2018 Initial Diagnosis    Palpable left breast mass, 2 adjacent masses at 2 o'clock position 2 cm biopsy IDC grade 2-3, Ig DCIS, ER 95%, PR 90%, Ki-67 30%, HER-2 -1+; 2:30 position intramammary lymph node 1.3 cm biopsy positive; left axillary lymph node biopsy negative discordant, T2N1, stage IIa   07/25/2018 Cancer Staging   Staging form: Breast, AJCC 8th Edition - Clinical: Stage IIA (cT1c, cN1, cM0, G3, ER+, PR+, HER2-) - Signed by Nicholas Lose, MD on 07/25/2018   08/21/2018 Surgery   Bilateral mastectomies: Left mastectomy: IDC grade 3, 2.1 cm, high-grade DCIS, margins negative, negative for LV I, 3/9 lymph nodes positive, ER 95% positive, PR 90% positive, HER-2 -1+, Ki-67 30% T2N1A stage IIa right mastectomy: Benign   09/03/2018 Cancer Staging   Staging form: Breast, AJCC 8th Edition - Pathologic: Stage IIA (pT2, pN1a, cM0, G3, ER+, PR+, HER2-) - Signed by Nicholas Lose, MD on 09/03/2018   09/03/2018 Miscellaneous   MammaPrint: High risk luminal type B, average 10-year risk of recurrence untreated 29%, predicted 5-year benefit of chemotherapy 94.6% distant metastasis free interval   09/26/2018 - 12/04/2018 Chemotherapy   Adjuvant chemotherapy with dose dense Adriamycin and Cytoxan x4 followed by weekly Taxol x1   02/18/2019 -  Radiation Therapy   Adjuvant XRT at [facility] COMPLIANT: Follow-up to discuss further anti-estrogen therapy   INTERVAL HISTORY: Paula Jacobs is a 58 y.o. with above-mentioned history of left breast cancer treated with bilateral mastectomies, adjuvant chemotherapy, and radiation. She presents to the clinic today to discuss further treatment with anti-estrogen therapy.  She has had profound radiation dermatitis but appears to be healing quite well.  REVIEW OF SYSTEMS:   Constitutional: Denies fevers, chills or abnormal weight loss Eyes: Denies blurriness of vision Ears, nose, mouth, throat, and face: Denies mucositis or sore throat Respiratory: Denies cough, dyspnea or wheezes Cardiovascular: Denies palpitation, chest discomfort Gastrointestinal: Denies nausea, heartburn or change in bowel habits Skin: Denies abnormal skin rashes Lymphatics: Denies new lymphadenopathy or easy bruising Neurological: Denies numbness, tingling or new weaknesses Behavioral/Psych: Mood is stable, no new changes  Extremities: No lower extremity edema Breast: Radiation dermatitis All other systems were reviewed with the patient and are negative.  I have reviewed the past medical history, past surgical history, social history and family history with the patient and they are unchanged from previous note.  ALLERGIES:  is allergic to compazine [prochlorperazine edisylate]; hydrocodone-acetaminophen; latex; and tramadol hcl.  MEDICATIONS:  Current Outpatient Medications  Medication Sig Dispense Refill  . acetaminophen (TYLENOL) 500 MG tablet Take 1,000 mg by mouth every 6 (six) hours as needed for moderate pain.    Marland Kitchen anastrozole (ARIMIDEX) 1 MG tablet Take 1 tablet (1 mg total) by mouth daily. 90 tablet 3  . benazepril-hydrochlorthiazide (LOTENSIN HCT)  10-12.5 MG tablet TAKE 1 TABLET BY MOUTH  EVERY DAY 90 tablet 1  . diphenhydrAMINE (BENADRYL) 25 MG tablet Take 25-50 mg by mouth every 6 (six) hours as needed for allergies.    Marland Kitchen levothyroxine (SYNTHROID) 150 MCG tablet Take 1 tablet (150 mcg total) by mouth daily. 90 tablet 3  . sertraline (ZOLOFT) 50 MG tablet TAKE 1 TABLET BY MOUTH EVERY DAY 30 tablet 2  . traZODone (DESYREL) 100 MG tablet Take 1 tablet (100 mg total) by mouth at bedtime as needed for sleep. 90 tablet 1   No current facility-administered medications for this visit.    Facility-Administered Medications Ordered in Other Visits  Medication Dose Route Frequency Provider Last Rate Last Dose  . heparin lock flush 100 unit/mL  500 Units Intracatheter Once PRN Nicholas Lose, MD      . sodium chloride flush (NS) 0.9 % injection 10 mL  10 mL Intracatheter PRN Nicholas Lose, MD        PHYSICAL EXAMINATION: ECOG PERFORMANCE STATUS: 1 - Symptomatic but completely ambulatory  Vitals:   05/03/19 1116  BP: (!) 144/70  Pulse: 63  Resp: 18  Temp: 98 F (36.7 C)  SpO2: 98%   Filed Weights   05/03/19 1116  Weight: 185 lb 3.2 oz (84 kg)    GENERAL: alert, no distress and comfortable SKIN: skin color, texture, turgor are normal, no rashes or significant lesions EYES: normal, Conjunctiva are pink and non-injected, sclera clear OROPHARYNX: no exudate, no erythema and lips, buccal mucosa, and tongue normal  NECK: supple, thyroid normal size, non-tender, without nodularity LYMPH: no palpable lymphadenopathy in the cervical, axillary or inguinal LUNGS: clear to auscultation and percussion with normal breathing effort HEART: regular rate & rhythm and no murmurs and no lower extremity edema ABDOMEN: abdomen soft, non-tender and normal bowel sounds MUSCULOSKELETAL: no cyanosis of digits and no clubbing  NEURO: alert & oriented x 3 with fluent speech, no focal motor/sensory deficits EXTREMITIES: No lower extremity edema  LABORATORY DATA:  I have reviewed the data as listed  CMP Latest Ref Rng & Units 12/04/2018 11/25/2018 11/24/2018  Glucose 70 - 99 mg/dL 132(H) 93 101(H)  BUN 6 - 20 mg/dL '8 6 7  '$ Creatinine 0.44 - 1.00 mg/dL 0.82 0.60 0.55  Sodium 135 - 145 mmol/L 140 138 139  Potassium 3.5 - 5.1 mmol/L 3.7 3.7 3.4(L)  Chloride 98 - 111 mmol/L 101 106 107  CO2 22 - 32 mmol/L '26 26 23  '$ Calcium 8.9 - 10.3 mg/dL 9.2 8.1(L) 8.5(L)  Total Protein 6.5 - 8.1 g/dL 7.4 6.0(L) 6.8  Total Bilirubin 0.3 - 1.2 mg/dL 0.3 0.4 0.3  Alkaline Phos 38 - 126 U/L 55 43 52  AST 15 - 41 U/L '25 17 17  '$ ALT 0 - 44 U/L 38 17 18    Lab Results  Component Value Date   WBC 6.9 01/08/2019   HGB 11.9 (L) 01/08/2019   HCT 38.9 01/08/2019   MCV 87.0 01/08/2019   PLT 518 (H) 01/08/2019   NEUTROABS 4.9 01/08/2019    ASSESSMENT & PLAN:  Malignant neoplasm of upper-outer quadrant of left breast in female, estrogen receptor positive ([place]) 08/21/2018:Bilateral mastectomies: Left mastectomy: IDC grade 3, 2.1 cm, high-grade DCIS, margins negative, negative for LV I, 3/9 lymph nodes positive, ER 95% positive, PR 90% positive, HER-2 -1+, Ki-67 30% T2N1A stage IIa right mastectomy: Benign MammaPrint: High risk luminal type B Echocardiogram 09/18/2018: EF 55 to 60% Patient has been enrolled and upbeat clinical  trial: No adverse effects related to the trial  Treatment plan: 1.Adjuvant chemotherapy with dose dense Adriamycin and Cytoxan x4 followed by weekly Taxol x1 discontinued due to intolerance 2.Followed by adjuvant radiation completed 04/20/2019 [facility] 3.  Followed by adjuvant antiestrogen therapy started 05/03/2019 ------------------------------------------------------------------------------------------------------------------ Current Treatment: Anastrozole 1 mg daily started 05/03/2019 Anastrozole counseling: We discussed the risks and benefits of anti-estrogen therapy with aromatase inhibitors. These include but not limited to insomnia, hot flashes, mood changes, vaginal dryness,  bone density loss, and weight gain. We strongly believe that the benefits far outweigh the risks. Patient understands these risks and consented to starting treatment. Planned treatment duration is 7 years.  Return to clinic in 3 months for survivorship care plan visit     No orders of the defined types were placed in this encounter.  The patient has a good understanding of the overall plan. she agrees with it. she will call with any problems that may develop before the next visit here.  Nicholas Lose, MD 05/03/2019  Julious Oka Dorshimer am acting as scribe for Dr. Nicholas Lose.  I have reviewed the above documentation for accuracy and completeness, and I agree with the above.

## 2019-05-02 NOTE — Telephone Encounter (Signed)
Called pt per 8/12 sch message - no answer left message for patient to call back to reschedule appt.

## 2019-05-03 ENCOUNTER — Inpatient Hospital Stay (HOSPITAL_BASED_OUTPATIENT_CLINIC_OR_DEPARTMENT_OTHER): Payer: BC Managed Care – PPO | Admitting: Hematology and Oncology

## 2019-05-03 ENCOUNTER — Other Ambulatory Visit: Payer: Self-pay

## 2019-05-03 ENCOUNTER — Inpatient Hospital Stay: Payer: BC Managed Care – PPO | Attending: Hematology and Oncology

## 2019-05-03 DIAGNOSIS — Z95828 Presence of other vascular implants and grafts: Secondary | ICD-10-CM

## 2019-05-03 DIAGNOSIS — Z9013 Acquired absence of bilateral breasts and nipples: Secondary | ICD-10-CM | POA: Diagnosis not present

## 2019-05-03 DIAGNOSIS — C50412 Malignant neoplasm of upper-outer quadrant of left female breast: Secondary | ICD-10-CM | POA: Insufficient documentation

## 2019-05-03 DIAGNOSIS — Z170 Estrogen receptor positive status [ER+]: Secondary | ICD-10-CM | POA: Insufficient documentation

## 2019-05-03 MED ORDER — SODIUM CHLORIDE 0.9% FLUSH
10.0000 mL | INTRAVENOUS | Status: DC | PRN
  Administered 2019-05-03: 11:00:00 10 mL
  Filled 2019-05-03: qty 10

## 2019-05-03 MED ORDER — ANASTROZOLE 1 MG PO TABS: 1.0000 mg | ORAL_TABLET | Freq: Every day | ORAL | 3 refills | Status: DC

## 2019-05-03 MED ORDER — HEPARIN SOD (PORK) LOCK FLUSH 100 UNIT/ML IV SOLN
500.0000 [IU] | Freq: Once | INTRAVENOUS | Status: AC | PRN
  Administered 2019-05-03: 11:00:00 500 [IU]
  Filled 2019-05-03: qty 5

## 2019-05-03 NOTE — Patient Instructions (Signed)

## 2019-05-03 NOTE — Assessment & Plan Note (Signed)
08/21/2018:Bilateral mastectomies: Left mastectomy: IDC grade 3, 2.1 cm, high-grade DCIS, margins negative, negative for LV I, 3/9 lymph nodes positive, ER 95% positive, PR 90% positive, HER-2 -1+, Ki-67 30% T2N1A stage IIa right mastectomy: Benign MammaPrint: High risk luminal type B Echocardiogram 09/18/2018: EF 55 to 60% Patient has been enrolled and upbeat clinical trial: No adverse effects related to the trial  Treatment plan: 1.Adjuvant chemotherapy with dose dense Adriamycin and Cytoxan x4 followed by weekly Taxol x1 discontinued due to intolerance 2.Followed by adjuvant radiation  3.  Followed by adjuvant antiestrogen therapy ------------------------------------------------------------------------------------------------------------------ Current Treatment: Anastrozole 1 mg daily started 05/03/2019 Anastrozole counseling: We discussed the risks and benefits of anti-estrogen therapy with aromatase inhibitors. These include but not limited to insomnia, hot flashes, mood changes, vaginal dryness, bone density loss, and weight gain. We strongly believe that the benefits far outweigh the risks. Patient understands these risks and consented to starting treatment. Planned treatment duration is 7 years.  Return to clinic in 3 months for survivorship care plan visit

## 2019-05-06 ENCOUNTER — Telehealth: Payer: Self-pay | Admitting: Hematology and Oncology

## 2019-05-06 ENCOUNTER — Encounter: Payer: Self-pay | Admitting: *Deleted

## 2019-05-06 NOTE — Telephone Encounter (Signed)
I talk with patient regarding schedule  

## 2019-05-08 ENCOUNTER — Other Ambulatory Visit: Payer: Self-pay

## 2019-05-08 ENCOUNTER — Encounter (HOSPITAL_BASED_OUTPATIENT_CLINIC_OR_DEPARTMENT_OTHER): Payer: Self-pay | Admitting: *Deleted

## 2019-05-09 ENCOUNTER — Telehealth: Payer: Self-pay | Admitting: Medical Oncology

## 2019-05-09 ENCOUNTER — Encounter (HOSPITAL_BASED_OUTPATIENT_CLINIC_OR_DEPARTMENT_OTHER)
Admission: RE | Admit: 2019-05-09 | Discharge: 2019-05-09 | Disposition: A | Discharge status: Home / Self Care | Payer: BC Managed Care – PPO | Source: Ambulatory Visit | Attending: Surgery | Admitting: Surgery

## 2019-05-09 DIAGNOSIS — Z01812 Encounter for preprocedural laboratory examination: Secondary | ICD-10-CM | POA: Insufficient documentation

## 2019-05-09 LAB — CBC WITH DIFFERENTIAL/PLATELET
Abs Immature Granulocytes: 0.04 10*3/uL (ref 0.00–0.07)
Basophils Absolute: 0 10*3/uL (ref 0.0–0.1)
Basophils Relative: 1 %
Eosinophils Absolute: 0.2 10*3/uL (ref 0.0–0.5)
Eosinophils Relative: 2 %
HCT: 39.5 % (ref 36.0–46.0)
Hemoglobin: 12.8 g/dL (ref 12.0–15.0)
Immature Granulocytes: 1 %
Lymphocytes Relative: 14 %
Lymphs Abs: 1.1 10*3/uL (ref 0.7–4.0)
MCH: 28.1 pg (ref 26.0–34.0)
MCHC: 32.4 g/dL (ref 30.0–36.0)
MCV: 86.6 fL (ref 80.0–100.0)
Monocytes Absolute: 0.4 10*3/uL (ref 0.1–1.0)
Monocytes Relative: 6 %
Neutro Abs: 5.7 10*3/uL (ref 1.7–7.7)
Neutrophils Relative %: 76 %
Platelets: 264 10*3/uL (ref 150–400)
RBC: 4.56 MIL/uL (ref 3.87–5.11)
RDW: 14.5 % (ref 11.5–15.5)
WBC: 7.4 10*3/uL (ref 4.0–10.5)
nRBC: 0 % (ref 0.0–0.2)

## 2019-05-09 LAB — COMPREHENSIVE METABOLIC PANEL
ALT: 14 U/L (ref 0–44)
AST: 21 U/L (ref 15–41)
Albumin: 3.9 g/dL (ref 3.5–5.0)
Alkaline Phosphatase: 60 U/L (ref 38–126)
Anion gap: 11 (ref 5–15)
BUN: 13 mg/dL (ref 6–20)
CO2: 24 mmol/L (ref 22–32)
Calcium: 9 mg/dL (ref 8.9–10.3)
Chloride: 103 mmol/L (ref 98–111)
Creatinine, Ser: 0.64 mg/dL (ref 0.44–1.00)
GFR calc Af Amer: >60 mL/min (ref >60)
GFR calc non Af Amer: >60 mL/min (ref >60)
Glucose, Bld: 107 mg/dL — ABNORMAL HIGH (ref 70–99)
Potassium: 3.7 mmol/L (ref 3.5–5.1)
Sodium: 138 mmol/L (ref 135–145)
Total Bilirubin: 0.8 mg/dL (ref 0.3–1.2)
Total Protein: 7.1 g/dL (ref 6.5–8.1)

## 2019-05-09 NOTE — Progress Notes (Signed)

## 2019-05-09 NOTE — Telephone Encounter (Signed)
UPBEAT I spoke with patient, this afternoon, regarding my inability to reach her for her 41-month assessments on study. The assessments were to be completed by early May. I inquired with patient if she wished to continue with study and patient confirmed she did. 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 Patient also states that she does not have a car, so transportation is an issue as well. I gave patient my understanding and informed her that since she wishes to continue with study, we will document the 3 month assessment as missed and I will follow-up with her at her next assessment, which is her 50-month assessment, which would be a visit between November and the end of February. Patient gave her verbal understanding and thanks. I thanked patient for willing to continue with the study and encouraged her to contact Dr. Lindi Adie or myself with any questions or concerns she may have. All patient's questions answered to her satisfaction.  Maxwell Marion, RN, BSN, [facility] Clinical Research 05/09/2019 2:50 PM

## 2019-05-10 NOTE — Progress Notes (Signed)
EKG reviewed by Dr. Roanna Banning, will proceed with surgery as scheduled.

## 2019-05-11 ENCOUNTER — Other Ambulatory Visit (HOSPITAL_COMMUNITY)
Admission: RE | Admit: 2019-05-11 | Discharge: 2019-05-11 | Disposition: A | Discharge status: Home / Self Care | Payer: BC Managed Care – PPO | Source: Ambulatory Visit | Attending: Surgery | Admitting: Surgery

## 2019-05-11 DIAGNOSIS — Z20828 Contact with and (suspected) exposure to other viral communicable diseases: Secondary | ICD-10-CM | POA: Diagnosis not present

## 2019-05-11 DIAGNOSIS — Z01812 Encounter for preprocedural laboratory examination: Secondary | ICD-10-CM | POA: Insufficient documentation

## 2019-05-11 LAB — SARS CORONAVIRUS 2 (TAT 6-24 HRS): SARS Coronavirus 2: NEGATIVE

## 2019-05-15 ENCOUNTER — Ambulatory Visit (HOSPITAL_BASED_OUTPATIENT_CLINIC_OR_DEPARTMENT_OTHER)
Admission: RE | Admit: 2019-05-15 | Discharge: 2019-05-15 | Disposition: A | Discharge status: Home / Self Care | Payer: BC Managed Care – PPO | Attending: Surgery | Admitting: Surgery

## 2019-05-15 ENCOUNTER — Ambulatory Visit (HOSPITAL_BASED_OUTPATIENT_CLINIC_OR_DEPARTMENT_OTHER): Payer: BC Managed Care – PPO | Admitting: Anesthesiology

## 2019-05-15 ENCOUNTER — Encounter (HOSPITAL_BASED_OUTPATIENT_CLINIC_OR_DEPARTMENT_OTHER): Payer: Self-pay | Admitting: *Deleted

## 2019-05-15 ENCOUNTER — Encounter (HOSPITAL_BASED_OUTPATIENT_CLINIC_OR_DEPARTMENT_OTHER)
Admission: RE | Disposition: A | Discharge status: Home / Self Care | Payer: Self-pay | Source: Home / Self Care | Attending: Surgery

## 2019-05-15 DIAGNOSIS — Z87891 Personal history of nicotine dependence: Secondary | ICD-10-CM | POA: Diagnosis not present

## 2019-05-15 DIAGNOSIS — Z9013 Acquired absence of bilateral breasts and nipples: Secondary | ICD-10-CM | POA: Diagnosis not present

## 2019-05-15 DIAGNOSIS — Z923 Personal history of irradiation: Secondary | ICD-10-CM | POA: Diagnosis not present

## 2019-05-15 DIAGNOSIS — Z452 Encounter for adjustment and management of vascular access device: Secondary | ICD-10-CM | POA: Diagnosis not present

## 2019-05-15 DIAGNOSIS — Z9221 Personal history of antineoplastic chemotherapy: Secondary | ICD-10-CM | POA: Insufficient documentation

## 2019-05-15 DIAGNOSIS — Z853 Personal history of malignant neoplasm of breast: Secondary | ICD-10-CM | POA: Diagnosis not present

## 2019-05-15 DIAGNOSIS — M069 Rheumatoid arthritis, unspecified: Secondary | ICD-10-CM | POA: Diagnosis not present

## 2019-05-15 DIAGNOSIS — I10 Essential (primary) hypertension: Secondary | ICD-10-CM | POA: Insufficient documentation

## 2019-05-15 DIAGNOSIS — Z79890 Hormone replacement therapy (postmenopausal): Secondary | ICD-10-CM | POA: Diagnosis not present

## 2019-05-15 DIAGNOSIS — Z79899 Other long term (current) drug therapy: Secondary | ICD-10-CM | POA: Insufficient documentation

## 2019-05-15 DIAGNOSIS — E039 Hypothyroidism, unspecified: Secondary | ICD-10-CM | POA: Diagnosis not present

## 2019-05-15 HISTORY — PX: PORT-A-CATH REMOVAL: SHX5289

## 2019-05-15 HISTORY — DX: Anxiety disorder, unspecified: F41.9

## 2019-05-15 SURGERY — REMOVAL PORT-A-CATH
Anesthesia: Monitor Anesthesia Care | Site: Chest | Laterality: Right

## 2019-05-15 MED ORDER — ONDANSETRON HCL 4 MG/2ML IJ SOLN: 4.0000 mg | Freq: Once | INTRAMUSCULAR | Status: DC | PRN

## 2019-05-15 MED ORDER — DEXTROSE 5 % IV SOLN
3.0000 g | INTRAVENOUS | Status: AC
  Administered 2019-05-15: 10:00:00 2 g via INTRAVENOUS

## 2019-05-15 MED ORDER — LACTATED RINGERS IV SOLN
INTRAVENOUS | Status: DC | PRN
  Administered 2019-05-15: 10:00:00 via INTRAVENOUS

## 2019-05-15 MED ORDER — MIDAZOLAM HCL 5 MG/5ML IJ SOLN
INTRAMUSCULAR | Status: DC | PRN
  Administered 2019-05-15 (×2): 1 mg via INTRAVENOUS

## 2019-05-15 MED ORDER — SCOPOLAMINE 1 MG/3DAYS TD PT72
MEDICATED_PATCH | TRANSDERMAL | Status: AC
  Filled 2019-05-15: qty 1

## 2019-05-15 MED ORDER — CHLORHEXIDINE GLUCONATE CLOTH 2 % EX PADS: 6.0000 | MEDICATED_PAD | Freq: Once | CUTANEOUS | Status: DC

## 2019-05-15 MED ORDER — FENTANYL CITRATE (PF) 250 MCG/5ML IJ SOLN
INTRAMUSCULAR | Status: DC | PRN
  Administered 2019-05-15 (×2): 50 ug via INTRAVENOUS

## 2019-05-15 MED ORDER — FENTANYL CITRATE (PF) 100 MCG/2ML IJ SOLN: 50.0000 ug | INTRAMUSCULAR | Status: DC | PRN

## 2019-05-15 MED ORDER — BUPIVACAINE-EPINEPHRINE 0.25% -1:200000 IJ SOLN
INTRAMUSCULAR | Status: DC | PRN
  Administered 2019-05-15: 10 mL

## 2019-05-15 MED ORDER — MIDAZOLAM HCL 2 MG/2ML IJ SOLN
INTRAMUSCULAR | Status: AC
  Filled 2019-05-15: qty 2

## 2019-05-15 MED ORDER — MIDAZOLAM HCL 2 MG/2ML IJ SOLN: 1.0000 mg | INTRAMUSCULAR | Status: DC | PRN

## 2019-05-15 MED ORDER — PROPOFOL 10 MG/ML IV BOLUS
INTRAVENOUS | Status: DC | PRN
  Administered 2019-05-15 (×2): 20 mg via INTRAVENOUS
  Administered 2019-05-15: 10 mg via INTRAVENOUS
  Administered 2019-05-15 (×2): 20 mg via INTRAVENOUS

## 2019-05-15 MED ORDER — FENTANYL CITRATE (PF) 100 MCG/2ML IJ SOLN: 25.0000 ug | INTRAMUSCULAR | Status: DC | PRN

## 2019-05-15 MED ORDER — IBUPROFEN 800 MG PO TABS: 800.0000 mg | ORAL_TABLET | Freq: Three times a day (TID) | ORAL | 0 refills | Status: AC | PRN

## 2019-05-15 MED ORDER — SCOPOLAMINE 1 MG/3DAYS TD PT72
1.0000 | MEDICATED_PATCH | Freq: Once | TRANSDERMAL | Status: DC
  Administered 2019-05-15: 09:00:00 1.5 mg via TRANSDERMAL

## 2019-05-15 MED ORDER — LIDOCAINE HCL (CARDIAC) PF 100 MG/5ML IV SOSY
PREFILLED_SYRINGE | INTRAVENOUS | Status: DC | PRN
  Administered 2019-05-15: 40 mg via INTRATRACHEAL

## 2019-05-15 MED ORDER — FENTANYL CITRATE (PF) 100 MCG/2ML IJ SOLN
INTRAMUSCULAR | Status: AC
  Filled 2019-05-15: qty 2

## 2019-05-15 MED ORDER — CEFAZOLIN SODIUM-DEXTROSE 2-4 GM/100ML-% IV SOLN
INTRAVENOUS | Status: AC
  Filled 2019-05-15: qty 100

## 2019-05-15 SURGICAL SUPPLY — 30 items
BENZOIN TINCTURE PRP APPL 2/3 (GAUZE/BANDAGES/DRESSINGS) IMPLANT
BLADE SURG 15 STRL LF DISP TIS (BLADE) ×1 IMPLANT
BLADE SURG 15 STRL SS (BLADE) ×1
CHLORAPREP W/TINT 26 (MISCELLANEOUS) ×2 IMPLANT
COVER BACK TABLE REUSABLE LG (DRAPES) ×2 IMPLANT
COVER MAYO STAND REUSABLE (DRAPES) ×2 IMPLANT
COVER WAND RF STERILE (DRAPES) IMPLANT
DECANTER SPIKE VIAL GLASS SM (MISCELLANEOUS) ×2 IMPLANT
DERMABOND ADVANCED (GAUZE/BANDAGES/DRESSINGS) ×1
DERMABOND ADVANCED .7 DNX12 (GAUZE/BANDAGES/DRESSINGS) ×1 IMPLANT
DRAPE LAPAROTOMY 100X72 PEDS (DRAPES) ×2 IMPLANT
DRAPE UTILITY XL STRL (DRAPES) ×2 IMPLANT
ELECT REM PT RETURN 9FT ADLT (ELECTROSURGICAL) ×2
ELECTRODE REM PT RTRN 9FT ADLT (ELECTROSURGICAL) ×1 IMPLANT
GLOVE BIOGEL PI IND STRL 8 (GLOVE) ×1 IMPLANT
GLOVE BIOGEL PI INDICATOR 8 (GLOVE) ×1
GLOVE ECLIPSE 8.0 STRL XLNG CF (GLOVE) ×2 IMPLANT
GOWN STRL REUS W/ TWL LRG LVL3 (GOWN DISPOSABLE) ×2 IMPLANT
GOWN STRL REUS W/TWL LRG LVL3 (GOWN DISPOSABLE) ×2
NEEDLE HYPO 25X1 1.5 SAFETY (NEEDLE) ×2 IMPLANT
NS IRRIG 1000ML POUR BTL (IV SOLUTION) ×2 IMPLANT
PACK BASIN DAY SURGERY FS (CUSTOM PROCEDURE TRAY) ×2 IMPLANT
PENCIL BUTTON HOLSTER BLD 10FT (ELECTRODE) IMPLANT
SLEEVE SCD COMPRESS KNEE MED (MISCELLANEOUS) ×2 IMPLANT
SPONGE LAP 4X18 RFD (DISPOSABLE) ×2 IMPLANT
STRIP CLOSURE SKIN 1/2X4 (GAUZE/BANDAGES/DRESSINGS) IMPLANT
SUT MON AB 4-0 PC3 18 (SUTURE) ×2 IMPLANT
SUT VICRYL 3-0 CR8 SH (SUTURE) ×2 IMPLANT
SYR CONTROL 10ML LL (SYRINGE) ×2 IMPLANT
TOWEL GREEN STERILE FF (TOWEL DISPOSABLE) ×2 IMPLANT

## 2019-05-15 NOTE — H&P (Signed)
Paula Jacobs Location: [facility] Patient #: [id number] DOB: 1961/05/23 Married / Language: English / Race: Black or African American Female  History of Present Illness Marcello Moores A. Loistine Eberlin MD; 03/29/2019 11:55 AM) Patient words: Patient returns for six-month follow-up for bilateral simple mastectomy with left axillary lymph node mapping in her lymph node biopsy. She is completing radiation therapy  She does have a Port-A-Cath in place. She is somewhat burned from radiation therapy but overall has done well.                     Diagnosis 1. Lymph node, biopsy, left axillary with radioactive seed - LYMPH NODE, NEGATIVE FOR CARCINOMA (0/1). 2. Breast, simple mastectomy, right - BENIGN BREAST TISSUE WITH MILD FIBROCYSTIC CHANGE AND RARE CALCIFICATIONS. - NEGATIVE FOR CARCINOMA. 3. Breast, simple mastectomy, left - INVASIVE DUCTAL CARCINOMA WITH CALCIFICATIONS, GRADE 3, 2.1 CM. - DUCTAL CARCINOMA IN SITU, INTERMEDIATE TO HIGH NUCLEAR GRADE. - RESECTION MARGINS ARE NEGATIVE FOR CARCINOMA. - NEGATIVE FOR LYMPHOVASCULAR OR PERINEURAL INVASION. - METASTATIC CARCINOMA TO ONE INTRAMAMMARY LYMPH NODE (1/1/). - METASTATIC CARCINOMA MEASURES 0.9 CM IN GREATEST DIMENSION. - BIOPSY SITE CHANGES. - SEE ONCOLOGY TABLE. 4. Lymph nodes, regional resection, left axillary - METASTATIC CARCINOMA TO TWO OF EIGHT LYMPH NODES (2/8). Microscopic Comment 3. INVASIVE CARCINOMA OF THE BREAST: Resection Procedure: Simple mastectomy. Specimen Laterality: Left. Tumor Size: 2.1 cm. Histologic Type: Invasive ductal carcinoma. Histologic Grade: Glandular (Acinar)/Tubular Differentiation: 3. Nuclear Pleomorphism: 3. Mitotic Rate: 3. Overall Grade: 3. Ductal Carcinoma In Situ: Present, intermediate to high nuclear grade. Tumor Extension: Not applicable. (required only if the structures are present and involved) (select all that apply) 1 of 3 FINAL for  Paula Jacobs, Paula Jacobs ([id number]) Microscopic Comment(continued) Margins: Distance from closest margin (millimeters): Greater than 10 mm. DCIS Margins: (required only if DCIS is present in specimen) Distance from closest margin (millimeters): Greater than 10 mm. Regional Lymph Nodes: Number of Lymph Nodes Examined: 9. Number of Sentinel Nodes Examined (if applicable): 0. Number of Lymph Nodes with Macrometastases (>2 mm): 3. Number of Lymph Nodes with Micrometastases: 0. Number of Lymph Nodes with Isolated Tumor Cells (?0.2 mm or ?200 cells)#: 0. Size of Largest Metastatic Deposit (millimeters): 9 mm. Extranodal Extension: Not identified. Treatment Effect: No known presurgical therapy. Breast Biomarker Testing Performed on Previous Biopsy: Yes. Testing Performed on Case Number: [id number]. Estrogen Receptor: 95%, strong staining intensity. Progesterone Receptor: 90%, strong staining intensity. HER2: Negative (1+). Ki-67: 30%. Representative tumor block: 3A. Pathologic Stage Classification (pTNM, AJCC 8th Edition): pT2, pN1a. (v4.2.0.0) Nilesh.  The patient is a 58 year old female.   Allergies  HYDROcodone-Acetaminophen *ANALGESICS - OPIOID* Itching. Latex Exam Gloves *MEDICAL DEVICES AND SUPPLIES* Itching. traMADol HCl *ANALGESICS - OPIOID* Itching, Nausea. Allergies Reconciled  Medication History  Benazepril-hydroCHLOROthiazide (10-12.5MG Tablet, Oral) Active. LORazepam (1MG Tablet, Oral) Active. Prochlorperazine Maleate (10MG Tablet, Oral) Active. Albuterol (90MCG/ACT Aerosol Soln, Inhalation) Active. Levothyroxine Sodium (175MCG Tablet, Oral) Active. Medications Reconciled    Vitals  03/29/2019 10:04 AM Weight: 185 lb Height: 62in Body Surface Area: 1.85 m Body Mass Index: 33.84 kg/m  Temp.: 97.89F  Pulse: 84 (Regular)  BP: 126/74 (Sitting, Left Arm, Standard)        Physical Exam   General Mental  Status-Alert. General Appearance-Consistent with stated age. Hydration-Well hydrated. Voice-Normal.  Breast Note: Both breasts. Left Chest Status Post Radiation Burning No Significant Skin Breakdown. Mild Left Arm Lymphedema Right Chest Wall Isn't Was Normal.  Musculoskeletal Normal Exam - Left-Upper Extremity  Strength Normal and Lower Extremity Strength Normal. Normal Exam - Right-Upper Extremity Strength Normal, Lower Extremity Weakness.    Assessment & Plan   HISTORY OF BREAST CANCER (Z85.3) Impression: Arrange for port removal near future. Referral for lymphedema management to physical therapy  Current Plans I recommended surgery to remove the catheter. I explained the technique of removal with use of local anesthesia & possible need for more aggressive sedation/anesthesia for patient comfort.  Risks such as bleeding, infection, and other risks were discussed. Post-operative dressing/incision care was discussed. I noted a good likelihood this will help address the problem. We will work to minimize complications. Questions were answered. The patient expresses understanding & wishes to proceed with surgery.  Pt Education - CCS Free Text Education/Instructions: discussed with patient and provided information.

## 2019-05-15 NOTE — Discharge Instructions (Signed)
GENERAL SURGERY: POST OP INSTRUCTIONS  ######################################################################  EAT Gradually transition to a high fiber diet with a fiber supplement over the next few weeks after discharge.  Start with a pureed / full liquid diet (see below)  WALK Walk an hour a day.  Control your pain to do that.    CONTROL PAIN Control pain so that you can walk, sleep, tolerate sneezing/coughing, go up/down stairs.  HAVE A BOWEL MOVEMENT DAILY Keep your bowels regular to avoid problems.  OK to try a laxative to override constipation.  OK to use an antidairrheal to slow down diarrhea.  Call if not better after 2 tries  CALL IF YOU HAVE PROBLEMS/CONCERNS Call if you are still struggling despite following these instructions. Call if you have concerns not answered by these instructions  ######################################################################    1. DIET: Follow a light bland diet & liquids the first 24 hours after arrival home, such as soup, liquids, starches, etc.  Be sure to drink plenty of fluids.  Quickly advance to a usual solid diet within a few days.  Avoid fast food or heavy meals as your are more likely to get nauseated or have irregular bowels.  A low-fat, high-fiber diet for the rest of your life is ideal.   2. Take your usually prescribed home medications unless otherwise directed. 3. PAIN CONTROL: a. Pain is best controlled by a usual combination of three different methods TOGETHER: i. Ice/Heat ii. Over the counter pain medication iii. Prescription pain medication b. Most patients will experience some swelling and bruising around the incisions.  Ice packs or heating pads (30-60 minutes up to 6 times a day) will help. Use ice for the first few days to help decrease swelling and bruising, then switch to heat to help relax tight/sore spots and speed recovery.  Some people prefer to use ice alone, heat alone, alternating between ice & heat.   Experiment to what works for you.  Swelling and bruising can take several weeks to resolve.   c. It is helpful to take an over-the-counter pain medication regularly for the first few weeks.  Choose one of the following that works best for you: i. Naproxen (Aleve, etc)  Two 220mg tabs twice a day ii. Ibuprofen (Advil, etc) Three 200mg tabs four times a day (every meal & bedtime) iii. Acetaminophen (Tylenol, etc) 500-650mg four times a day (every meal & bedtime) d. A  prescription for pain medication (such as oxycodone, hydrocodone, etc) should be given to you upon discharge.  Take your pain medication as prescribed.  i. If you are having problems/concerns with the prescription medicine (does not control pain, nausea, vomiting, rash, itching, etc), please call us [phone] to see if we need to switch you to a different pain medicine that will work better for you and/or control your side effect better. ii. If you need a refill on your pain medication, please contact your pharmacy.  They will contact our office to request authorization. Prescriptions will not be filled after 5 pm or on week-ends. 4. Avoid getting constipated.  Between the surgery and the pain medications, it is common to experience some constipation.  Increasing fluid intake and taking a fiber supplement (such as Metamucil, Citrucel, FiberCon, MiraLax, etc) 1-2 times a day regularly will usually help prevent this problem from occurring.  A mild laxative (prune juice, Milk of Magnesia, MiraLax, etc) should be taken according to package directions if there are no bowel movements after 48 hours.   5. Wash /   shower every day.  You may shower over the dressings as they are waterproof.  Continue to shower over incision(s) after the dressing is off. 6. Remove your waterproof bandages 5 days after surgery.  You may leave the incision open to air.  You may have skin tapes (Steri Strips) covering the incision(s).  Leave them on until one week, then  remove.  You may replace a dressing/Band-Aid to cover the incision for comfort if you wish.      7. ACTIVITIES as tolerated:   a. You may resume regular (light) daily activities beginning the next day--such as daily self-care, walking, climbing stairs--gradually increasing activities as tolerated.  If you can walk 30 minutes without difficulty, it is safe to try more intense activity such as jogging, treadmill, bicycling, low-impact aerobics, swimming, etc. b. Save the most intensive and strenuous activity for last such as sit-ups, heavy lifting, contact sports, etc  Refrain from any heavy lifting or straining until you are off narcotics for pain control.   c. DO NOT PUSH THROUGH PAIN.  Let pain be your guide: If it hurts to do something, don't do it.  Pain is your body warning you to avoid that activity for another week until the pain goes down. d. You may drive when you are no longer taking prescription pain medication, you can comfortably wear a seatbelt, and you can safely maneuver your car and apply brakes. e. You may have sexual intercourse when it is comfortable.  8. FOLLOW UP in our office a. Please call [facility] at [phone] to set up an appointment to see your surgeon in the office for a follow-up appointment approximately 2-3 weeks after your surgery. b. Make sure that you call for this appointment the day you arrive home to insure a convenient appointment time. 9. IF YOU HAVE DISABILITY OR FAMILY LEAVE FORMS, BRING THEM TO THE OFFICE FOR PROCESSING.  DO NOT GIVE THEM TO YOUR DOCTOR.   WHEN TO CALL US [phone]: 1. Poor pain control 2. Reactions / problems with new medications (rash/itching, nausea, etc)  3. Fever over 101.5 F (38.5 C) 4. Worsening swelling or bruising 5. Continued bleeding from incision. 6. Increased pain, redness, or drainage from the incision 7. Difficulty breathing / swallowing   The clinic staff is available to answer your questions during regular  business hours (8:30am-5pm).  Please don't hesitate to call and ask to speak to one of our nurses for clinical concerns.   If you have a medical emergency, go to the nearest emergency room or call 911.  A surgeon from [facility] is always on call at the hospitals   [facility] [address], [address], [place], [place]  [address] ? MAIN: [phone] ? TOLL FREE: [phone] ?  FAX [phone] [email or web address]   Post Anesthesia Home Care Instructions  Activity: Get plenty of rest for the remainder of the day. A responsible individual must stay with you for 24 hours following the procedure.  For the next 24 hours, DO NOT: -Drive a car -Operate machinery -Drink alcoholic beverages -Take any medication unless instructed by your physician -Make any legal decisions or sign important papers.  Meals: Start with liquid foods such as gelatin or soup. Progress to regular foods as tolerated. Avoid greasy, spicy, heavy foods. If nausea and/or vomiting occur, drink only clear liquids until the nausea and/or vomiting subsides. Call your physician if vomiting continues.  Special Instructions/Symptoms: Your throat may feel dry or sore   from the anesthesia or the breathing tube placed in your throat during surgery. If this causes discomfort, gargle with warm salt water. The discomfort should disappear within 24 hours.  If you had a scopolamine patch placed behind your ear for the management of post- operative nausea and/or vomiting:  1. The medication in the patch is effective for 72 hours, after which it should be removed.  Wrap patch in a tissue and discard in the trash. Wash hands thoroughly with soap and water. 2. You may remove the patch earlier than 72 hours if you experience unpleasant side effects which may include dry mouth, dizziness or visual disturbances. 3. Avoid touching the patch. Wash your hands with soap and water after contact  with the patch.      

## 2019-05-15 NOTE — Op Note (Signed)

## 2019-05-15 NOTE — Interval H&P Note (Signed)
History and Physical Interval Note:  05/15/2019 9:32 AM  Paula Jacobs  has presented today for surgery, with the diagnosis of PORT IN PLACE.  The various methods of treatment have been discussed with the patient and family. After consideration of risks, benefits and other options for treatment, the patient has consented to  Procedure(s): REMOVAL PORT-A-CATH (N/A) as a surgical intervention.  The patient's history has been reviewed, patient examined, no change in status, stable for surgery.  I have reviewed the patient's chart and labs.  Questions were answered to the patient's satisfaction.     Geraldine

## 2019-05-15 NOTE — Transfer of Care (Signed)
Immediate Anesthesia Transfer of Care Note  Patient: Paula Jacobs  Procedure(s) Performed: REMOVAL PORT-A-CATH (Right Chest)  Patient Location: PACU  Anesthesia Type:MAC  Level of Consciousness: awake  Airway & Oxygen Therapy: Patient Spontanous Breathing  Post-op Assessment: Report given to RN and Post -op Vital signs reviewed and stable  Post vital signs: Reviewed and stable  Last Vitals:  Vitals Value Taken Time  BP 132/84 05/15/19 1035  Temp 36.6 C 05/15/19 1033  Pulse 72 05/15/19 1038  Resp 18 05/15/19 1038  SpO2 99 % 05/15/19 1038  Vitals shown include unvalidated device data.  Last Pain:  Vitals:   05/15/19 0845  TempSrc: Oral  PainSc: 0-No pain         Complications: No apparent anesthesia complications

## 2019-05-15 NOTE — Anesthesia Preprocedure Evaluation (Addendum)
Anesthesia Evaluation  Patient identified by MRN, date of birth, ID band Patient awake    Reviewed: Allergy & Precautions, NPO status , Patient's Chart, lab work & pertinent test results  Airway Mallampati: II  TM Distance: >3 FB Neck ROM: Full    Dental  (+) Teeth Intact, Dental Advisory Given   Pulmonary former smoker,    breath sounds clear to auscultation       Cardiovascular hypertension,  Rhythm:Regular Rate:Normal     Neuro/Psych    GI/Hepatic   Endo/Other    Renal/GU      Musculoskeletal   Abdominal   Peds  Hematology   Anesthesia Other Findings   Reproductive/Obstetrics                            Anesthesia Physical Anesthesia Plan  ASA: III  Anesthesia Plan: MAC   Post-op Pain Management:    Induction:   PONV Risk Score and Plan:   Airway Management Planned: Natural Airway and Simple Face Mask  Additional Equipment:   Intra-op Plan:   Post-operative Plan:   Informed Consent: I have reviewed the patients History and Physical, chart, labs and discussed the procedure including the risks, benefits and alternatives for the proposed anesthesia with the patient or authorized representative who has indicated his/her understanding and acceptance.     Dental advisory given  Plan Discussed with: CRNA and Anesthesiologist  Anesthesia Plan Comments:        Anesthesia Quick Evaluation

## 2019-05-15 NOTE — Anesthesia Postprocedure Evaluation (Signed)
Anesthesia Post Note  Patient: Paula Jacobs  Procedure(s) Performed: REMOVAL PORT-A-CATH (Right Chest)     Patient location during evaluation: PACU Anesthesia Type: MAC Level of consciousness: awake and alert Pain management: pain level controlled Vital Signs Assessment: post-procedure vital signs reviewed and stable Respiratory status: spontaneous breathing, nonlabored ventilation, respiratory function stable and patient connected to nasal cannula oxygen Cardiovascular status: stable and blood pressure returned to baseline Postop Assessment: no apparent nausea or vomiting Anesthetic complications: no    Last Vitals:  Vitals:   05/15/19 1100 05/15/19 1117  BP: 134/78 (!) 148/85  Pulse: 64 70  Resp: 16 16  Temp:  36.5 C  SpO2: 100% 98%    Last Pain:  Vitals:   05/15/19 1117  TempSrc:   PainSc: 0-No pain                 Fleetwood Pierron COKER

## 2019-05-16 ENCOUNTER — Encounter (HOSPITAL_BASED_OUTPATIENT_CLINIC_OR_DEPARTMENT_OTHER): Payer: Self-pay | Admitting: Surgery

## 2019-06-27 IMAGING — MG MM CLIP PLACEMENT
2 series · 3 of 6 positions shown · non-contrast
Comparison: Previous exam(s).

CLINICAL DATA: Status post ultrasound-guided placement of LEFT
axillary radioactive seed.

EXAM:
DIAGNOSTIC LEFT MAMMOGRAM POST ULTRASOUND-GUIDED RADIOACTIVE SEED
PLACEMENT

[L TAN synth-2D]
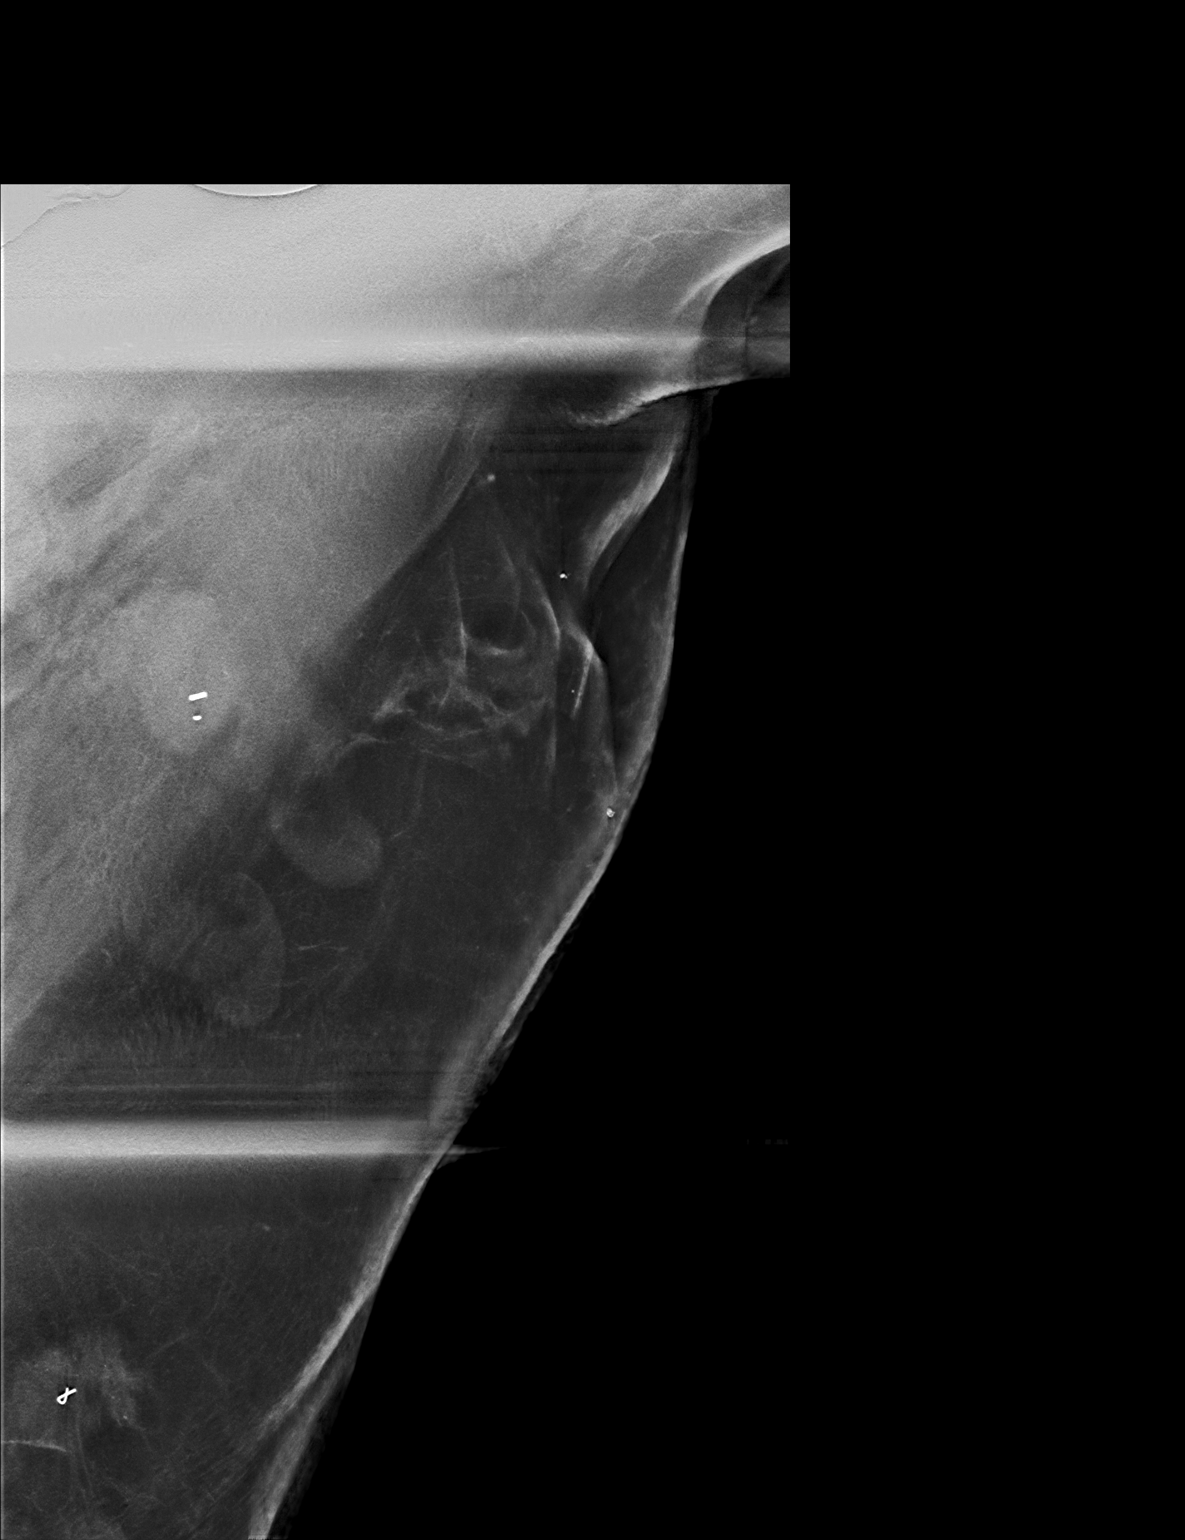

[L TAN tomo · 2 of 106 frames shown]
[frame 35/106]
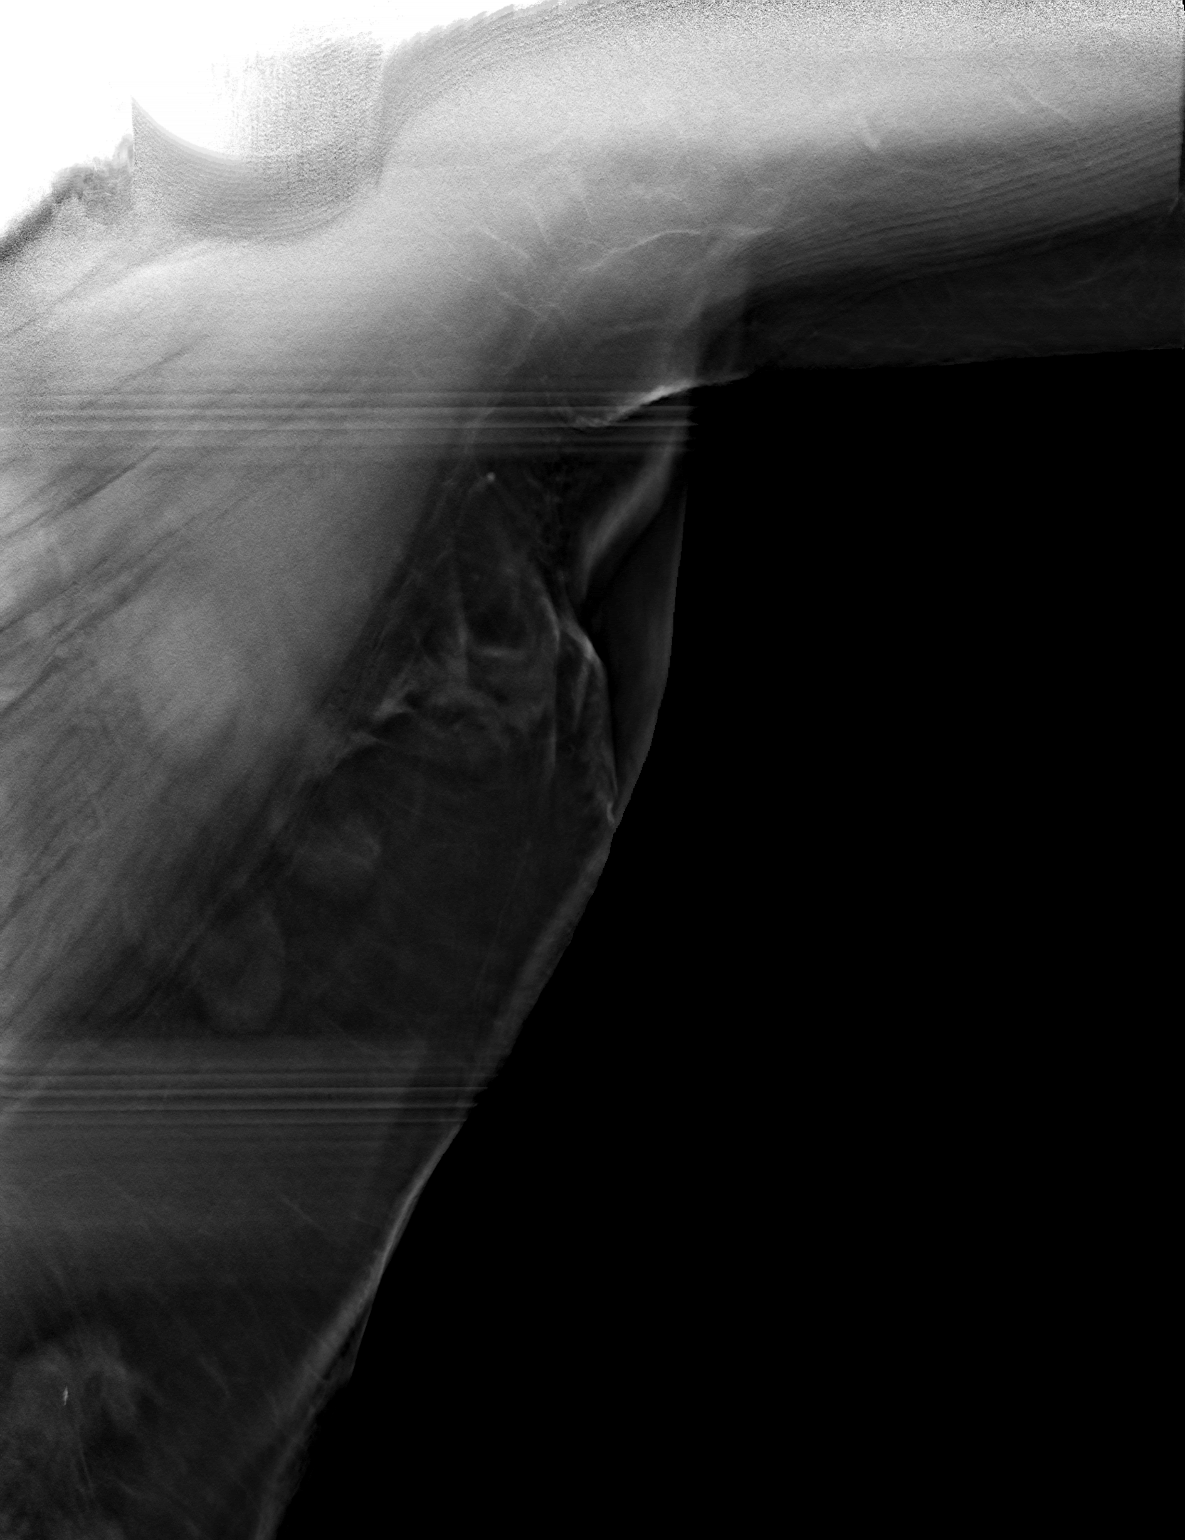
[frame 53/106]
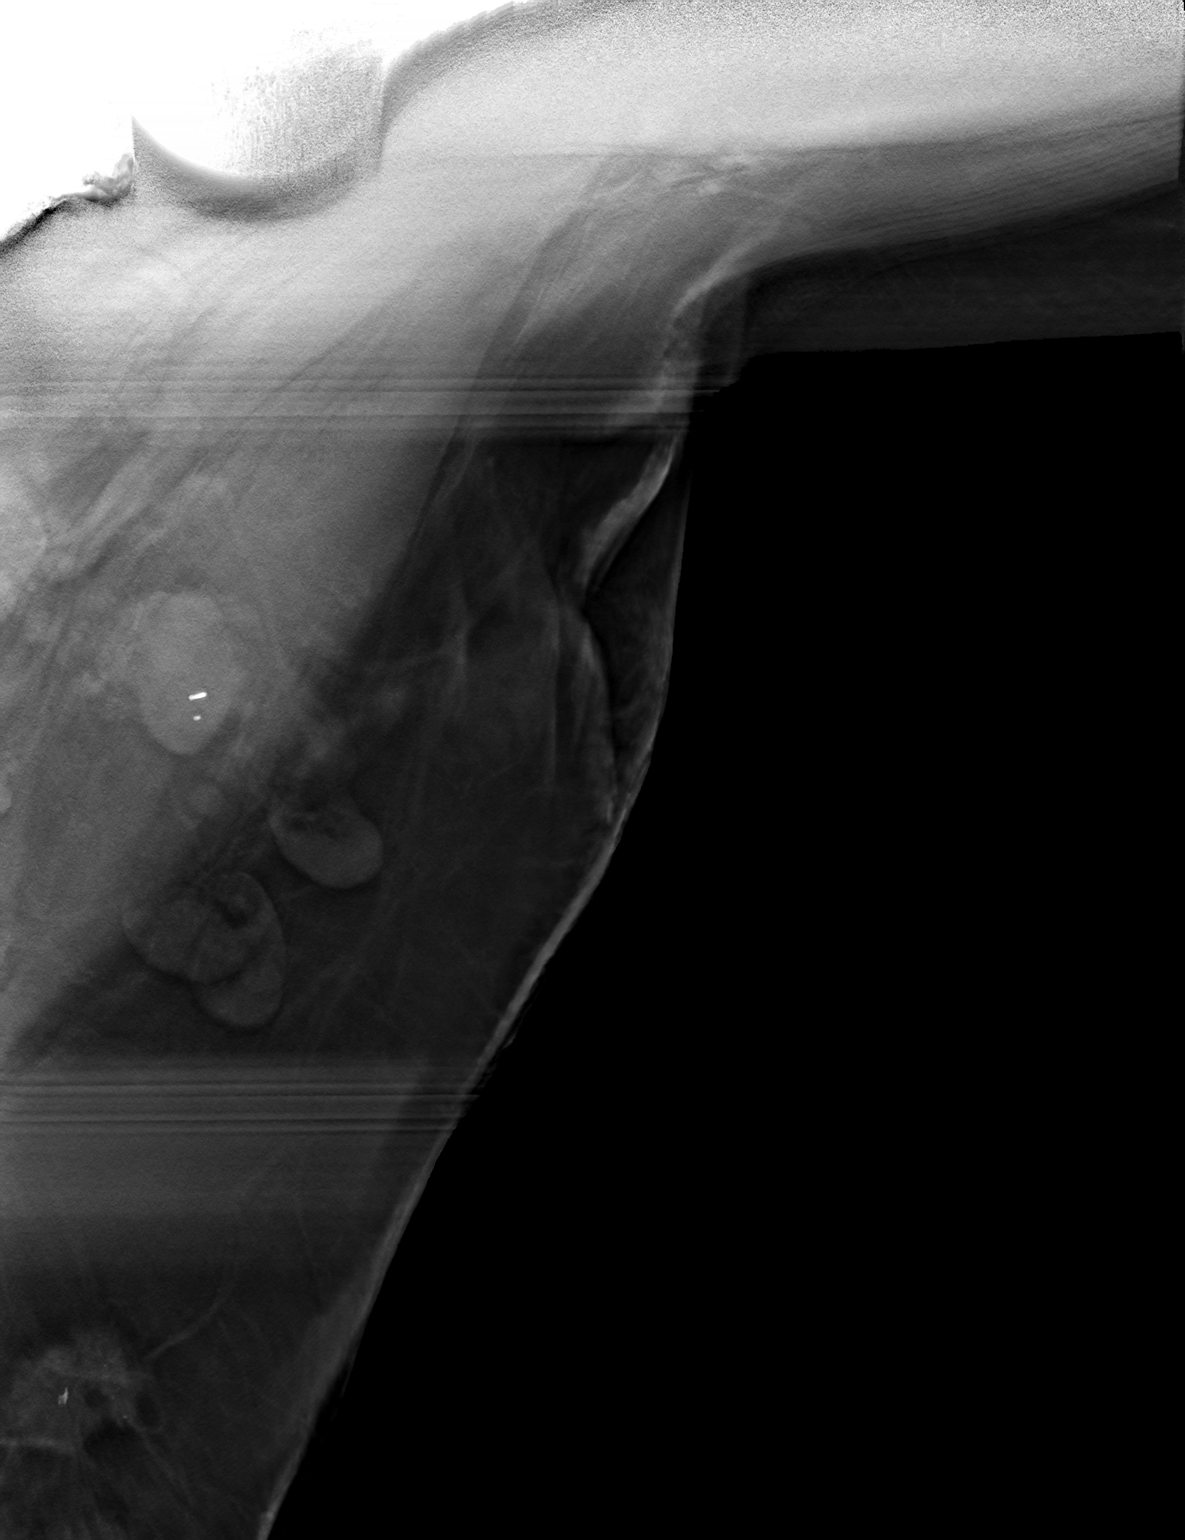

[3 of 6 positions shown; findings below may reference images not displayed]

FINDINGS: Mammographic images were obtained following ultrasound-guided
radioactive seed placement. These demonstrate the radioactive seed
adjacent to tissue marker clip placed the time of ultrasound-guided
core biopsy.
IMPRESSION: Appropriate location of the radioactive seed.

Final Assessment: Post Procedure Mammograms for Seed Placement

## 2019-06-27 IMAGING — US US PLC BREAST LOC DEV 1ST LESION  INC US GUIDE*L*
1 series · 3 of 3 positions shown · non-contrast
Comparison: Previous exam(s).

CLINICAL DATA: The patient presents for radioactive seed
localization of LEFT axillary lymph node. Patient scheduled for
bilateral mastectomy.

EXAM:
ULTRASOUND GUIDED RADIOACTIVE SEED LOCALIZATION OF THE LEFT AXILLA

[Series 1: us plc breast loc dev 1st lesion inc us guide*left · 0.07mm/px · 3 of 3 slices shown]
[im 1/3]
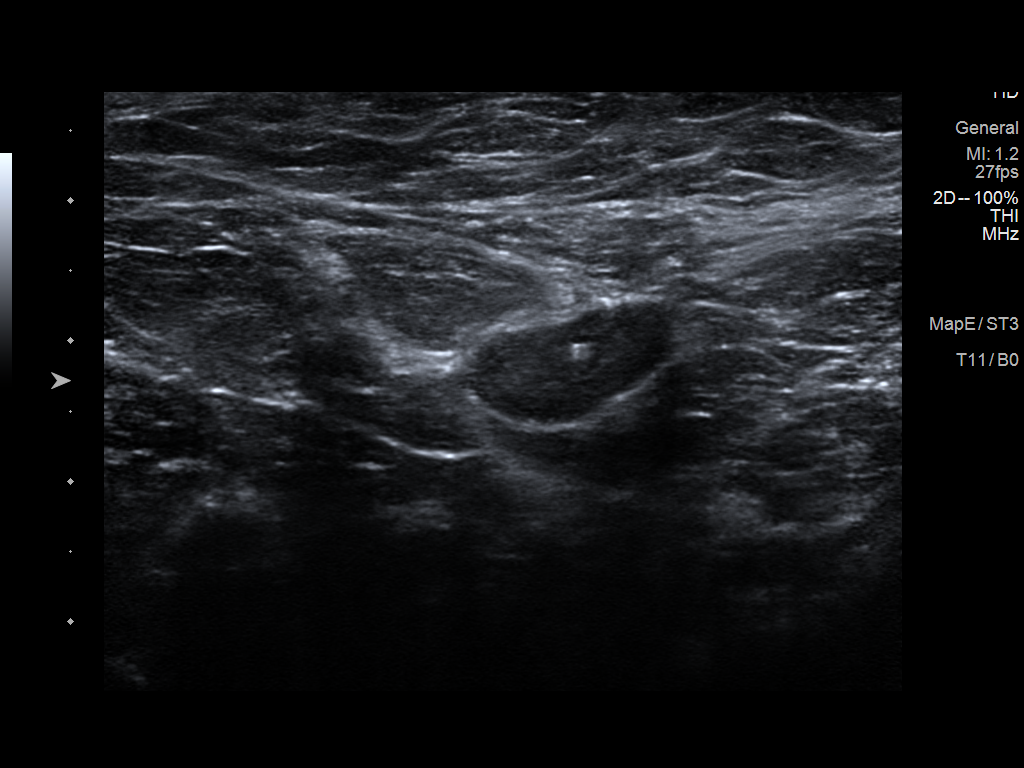
[im 2/3]
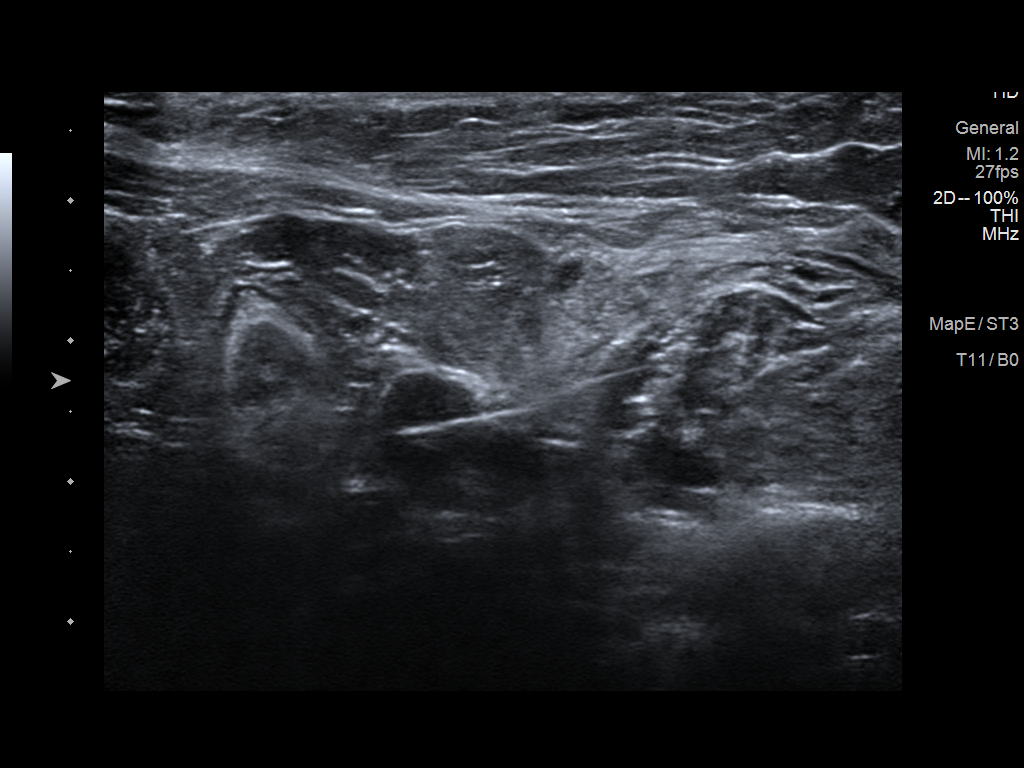
[im 3/3]
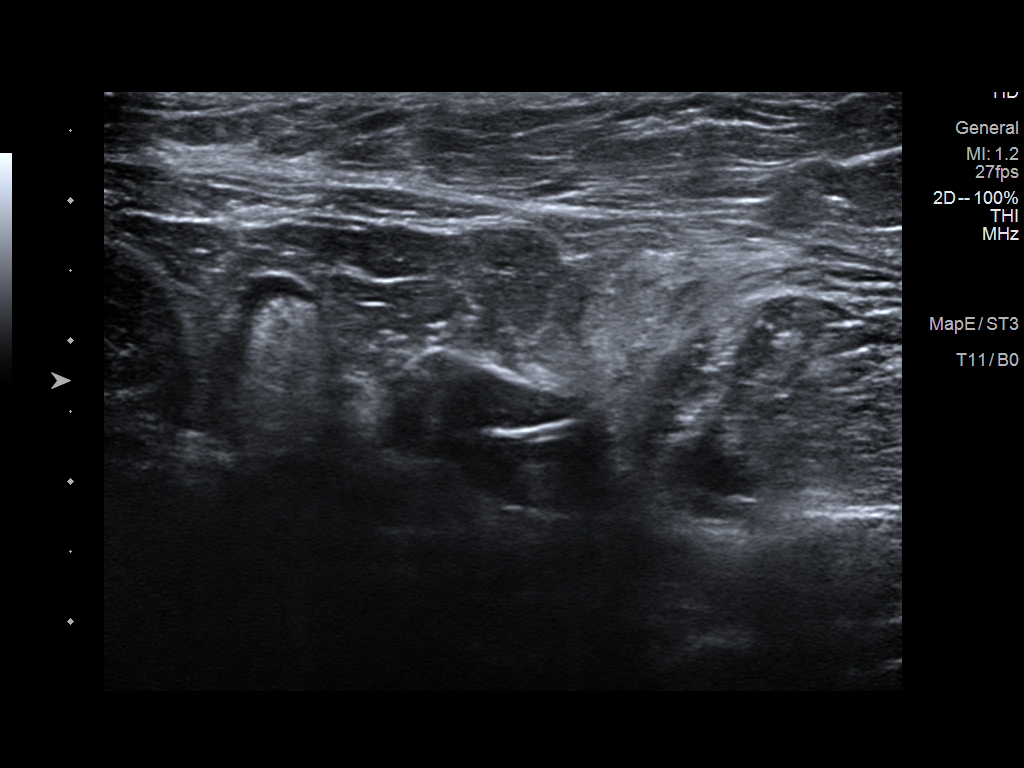

[3 of 3 positions shown; findings below may reference images not displayed]

FINDINGS: Patient presents for radioactive seed localization prior to
bilateral mastectomy and targeted LEFT axillary lymph node
dissection.. I met with the patient and we discussed the procedure
of seed localization including benefits and alternatives. We
discussed the high likelihood of a successful procedure. We
discussed the risks of the procedure including infection, bleeding,
tissue injury and further surgery. We discussed the low dose of
radioactivity involved in the procedure. Informed, written consent
was given.

The usual time-out protocol was performed immediately prior to the
procedure.

Using ultrasound guidance, sterile technique, 1% lidocaine and an
6-6OQ radioactive seed, the lymph node containing tissue marker clip
in the LEFT axilla was localized using a LATERAL to MEDIAL approach.
The follow-up mammogram images confirm the seed in the expected
location and were marked for Dr. Katerine.

Follow-up survey of the patient confirms presence of the radioactive
seed.

Order number of [id number] seed:  [id number].

Total activity:  0.255 millicuries reference Date: 07/30/2018

The patient tolerated the procedure well and was released from the
[REDACTED]. She was given instructions regarding seed removal.
IMPRESSION: Radioactive seed localization of the LEFT axilla. No apparent
complications.

## 2019-07-19 ENCOUNTER — Other Ambulatory Visit: Payer: Self-pay | Admitting: Family Medicine

## 2019-07-19 MED ORDER — LEVOTHYROXINE SODIUM 150 MCG PO TABS: 150.0000 ug | ORAL_TABLET | Freq: Every day | ORAL | 0 refills | Status: DC

## 2019-07-19 NOTE — Telephone Encounter (Signed)
levothyroxine (SYNTHROID) 150 MCG tablet     Patient is requesting a refill. Patient states she has two tablets left.    Pharmacy:  [id number] - [place], [place] - [place] [phone] (Phone) [phone] (Fax)

## 2019-07-19 NOTE — Telephone Encounter (Signed)
Requested medication (s) are due for refill today: yes  Requested medication (s) are on the active medication list: yes  Last refill:  01/14/2019  Future visit scheduled: No  Notes to clinic:  Review for refill   Requested Prescriptions  Pending Prescriptions Disp Refills   levothyroxine (SYNTHROID) 150 MCG tablet 90 tablet 0    Sig: Take 1 tablet (150 mcg total) by mouth daily.     Endocrinology:  Hypothyroid Agents Failed - 07/19/2019  9:09 AM      Failed - TSH needs to be rechecked within 3 months after an abnormal result. Refill until TSH is due.      Failed - TSH in normal range and within 360 days    TSH  Date Value Ref Range Status  01/08/2019 0.161 (L) 0.308 - 3.960 uIU/mL Final    Comment:    Performed at [facility], [address] [place], [place] [address]  03/20/2018 2.08 0.35 - 4.50 uIU/mL Final         Passed - Valid encounter within last 12 months    Recent Outpatient Visits          4 months ago [place] at [facility], Steele Berg, MD   4 months ago [place] at [facility], Steele Berg, MD   5 months ago [place] at [facility], Steele Berg, MD   6 months ago Hypothyroidism, unspecified type   Therapist, music at [place], DO   7 months ago Hypothyroidism, unspecified type   Therapist, music at [facility], [place], DO

## 2019-07-23 ENCOUNTER — Telehealth: Payer: Self-pay | Admitting: Adult Health

## 2019-07-23 NOTE — Telephone Encounter (Signed)
I talk with patient regarding moved appointment from 11/20 to 12/4

## 2019-08-02 IMAGING — CR DG CHEST 1V PORT
1 series · 1 of 1 positions shown · non-contrast
Comparison: PA and lateral chest 11/02/2016.

CLINICAL DATA: Status post Port-A-Cath placement today.

EXAM:
PORTABLE CHEST 1 VIEW

[chest ap]
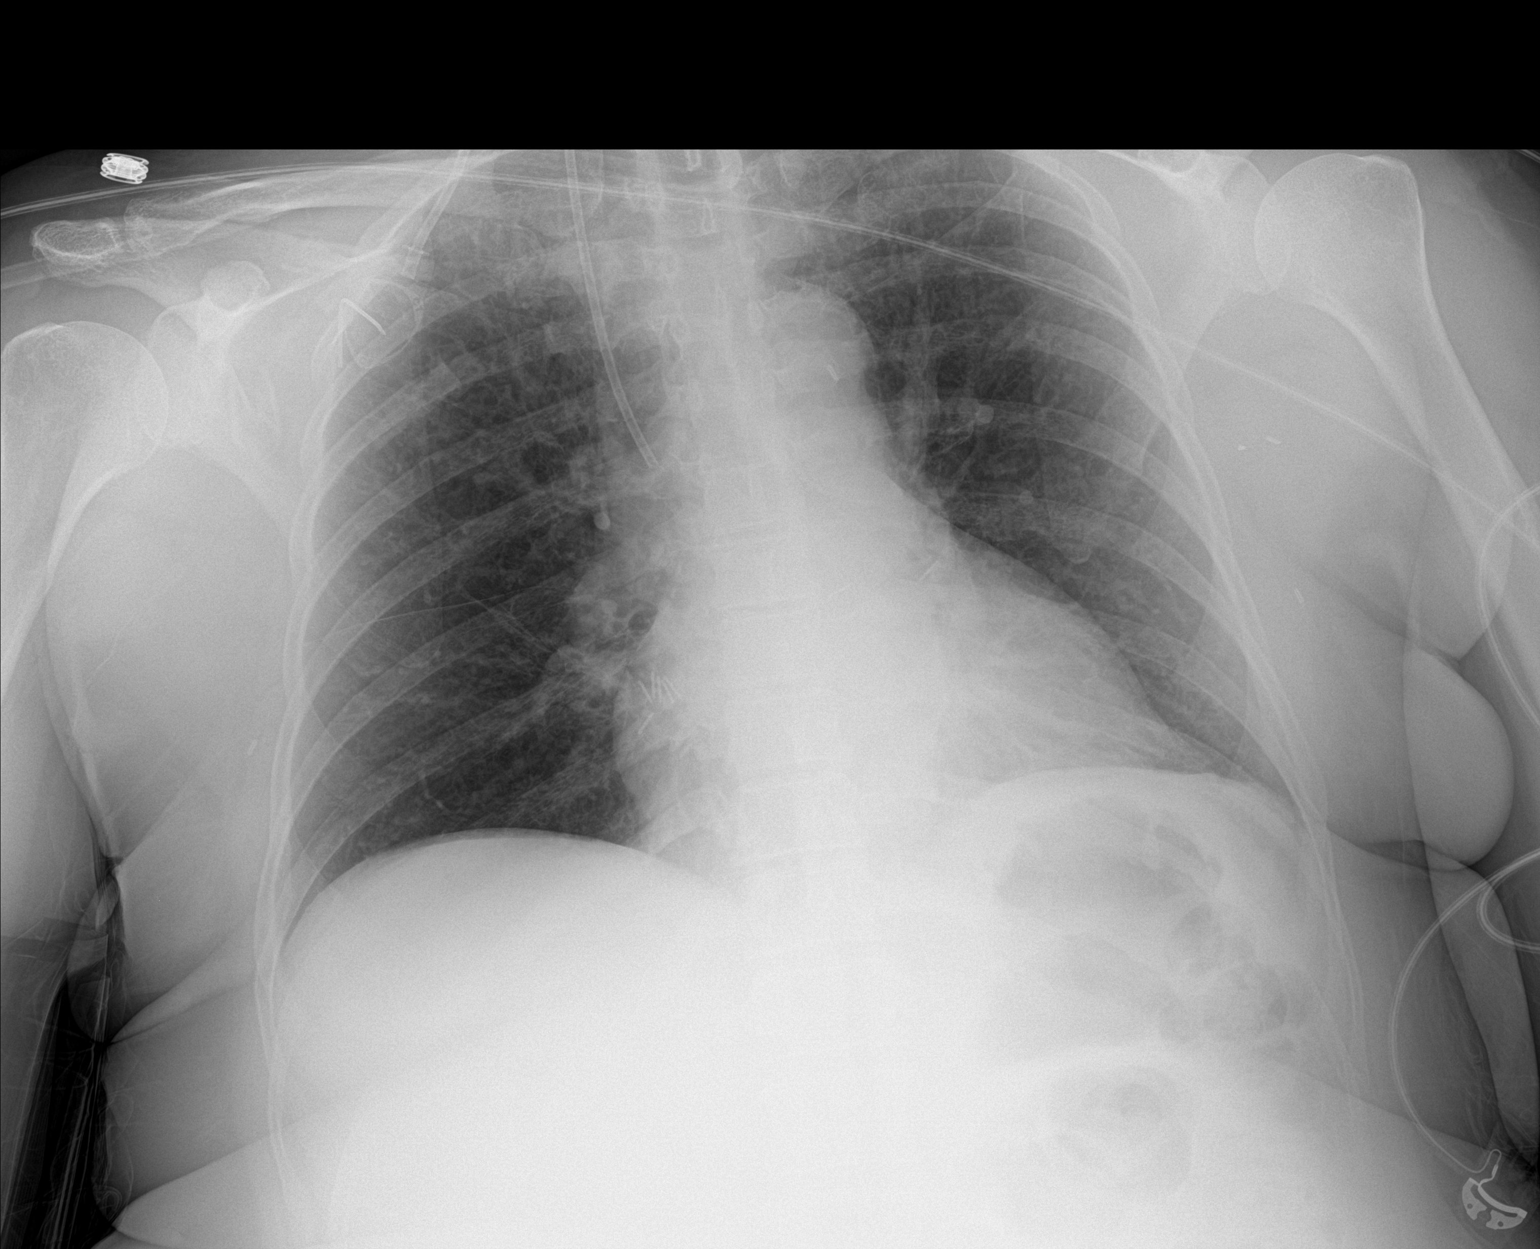

[1 of 1 positions shown; findings below may reference images not displayed]

FINDINGS: Right IJ approach Port-A-Cath tip projects in the mid superior vena
cava. Proximal most aspect of the catheter in the neck is off the
top of the film. No pneumothorax. Lungs clear. Heart size normal. No
acute or focal bony abnormality.
IMPRESSION: Port-A-Cath tip projects in the mid superior vena cava. No
pneumothorax.

## 2019-08-09 ENCOUNTER — Encounter: Payer: BC Managed Care – PPO | Admitting: Adult Health

## 2019-08-23 ENCOUNTER — Inpatient Hospital Stay: Payer: BC Managed Care – PPO | Attending: Adult Health | Admitting: Adult Health

## 2019-08-23 NOTE — Progress Notes (Deleted)
CLINIC:  Survivorship   REASON FOR VISIT:  Routine follow-up post-treatment for a recent history of breast cancer.  BRIEF ONCOLOGIC HISTORY:  Oncology History  Malignant neoplasm of upper-outer quadrant of left breast in female, estrogen receptor positive ([place])  07/13/2018 Initial Diagnosis    Palpable left breast mass, 2 adjacent masses at 2 o'clock position 2 cm biopsy IDC grade 2-3, Ig DCIS, ER 95%, PR 90%, Ki-67 30%, HER-2 -1+; 2:30 position intramammary lymph node 1.3 cm biopsy positive; left axillary lymph node biopsy negative discordant, T2N1, stage IIa   07/25/2018 Cancer Staging   Staging form: Breast, AJCC 8th Edition - Clinical: Stage IIA (cT1c, cN1, cM0, G3, ER+, PR+, HER2-) - Signed by Nicholas Lose, MD on 07/25/2018   08/08/2018 Genetic Testing   Genetic testing performed through [employer]'s Cancernext + RNA insight panel reported out on 08/08/2018 showed no pathogenic mutations. The CancerNext + RNA insight gene panel offered by [employer] includes sequencing and rearrangement analysis for the following 34 genes:   APC*, ATM*, BARD1, BMPR1A, BRCA1*, BRCA2*, BRIP1*, CDH1*, CDK4, CDKN2A, CHEK2*, DICER1,HOXB13, MLH1*, MRE11A, MSH2*, MSH6*, MUTYH*, NBN, NF1*, PALB2*, PMS2*, POLD1, POLE, PTEN*, RAD50, RAD51C*, RAD51D*, SMAD4, SMARCA4, STK11 and TP53* (sequencing and deletion/duplication); EPCAM and GREM1 (deletion/duplication only). DNA and RNA analyses performed for * genes   08/21/2018 Surgery   Bilateral mastectomies: Left mastectomy: IDC grade 3, 2.1 cm, high-grade DCIS, margins negative, negative for LV I, 3/9 lymph nodes positive, ER 95% positive, PR 90% positive, HER-2 -1+, Ki-67 30% T2N1A stage IIa right mastectomy: Benign   09/03/2018 Cancer Staging   Staging form: Breast, AJCC 8th Edition - Pathologic: Stage IIA (pT2, pN1a, cM0, G3, ER+, PR+, HER2-) - Signed by Nicholas Lose, MD on 09/03/2018   09/03/2018 Miscellaneous   MammaPrint: High risk luminal type B, average  10-year risk of recurrence untreated 29%, predicted 5-year benefit of chemotherapy 94.6% distant metastasis free interval   09/26/2018 - 12/04/2018 Chemotherapy   Adjuvant chemotherapy with dose dense Adriamycin and Cytoxan x4 followed by weekly Taxol x1   03/19/2019 - 04/02/2019 Radiation Therapy   Adjuvant XRT at [facility] "Between 03/19/2019 and 04/02/2019 the patient received 5800 cGy in 29 fractions (of a planned 6000 cGy in 30 fractions) to the left chest wall mastectomy scar region. Treatment initially encompassed the left chest wall and left supraclavicular/axillary region delivering 5000 cGy in 25 fractions. The left chest wall was treated using opposed tangential field in field approach with a combination of 6 MV and 23 MV photons. The left supraclavicular/axillary region was treated using a RAO/PA technique with 23 MV photons. This was followed by a boost to the left chest wall mastectomy scar region delivering 800 cGy in 4 fractions (of a planned 1000 cGy in 5 fractions) using an en face technique with a 9 MeV electron beam. The treatment course was completed over 42 elapsed days."   04/2019 -  Anti-estrogen oral therapy   Anastrozole daily     INTERVAL HISTORY:  Ms. Mares presents to the [facility] today for our initial meeting to review her survivorship care plan detailing her treatment course for breast cancer, as well as monitoring long-term side effects of that treatment, education regarding health maintenance, screening, and overall wellness and health promotion.     Overall, Ms. Rone reports feeling quite well since completing her radiation therapy approximately 3 months ago.  She ***    REVIEW OF SYSTEMS:  Review of Systems - Oncology Breast: Denies any new nodularity, masses, tenderness,  nipple changes, or nipple discharge.      ONCOLOGY TREATMENT TEAM:  1. Surgeon:  Dr. Marland Kitchen at [facility] 2. Medical Oncologist: Dr. Marland Kitchen  3. Radiation Oncologist:  Dr. Marland Kitchen    PAST MEDICAL/SURGICAL HISTORY:  Past Medical History:  Diagnosis Date  . ANEMIA-NOS 04/23/2008  . Aneurysm ([place]) 10/19/2017   Evaluated by MRA 10/2017, 70m, stable  . Anginal pain ([facility]    went to ED in april 2018 c/o chest pain over last 2 months ; had EKG, CXR  and troponin negative per physician suspected musculoskeletal  ; dc'd with ibuprofen  and recc f/u with outpatient stress test; see care everywhere ED visit  ; patient denies recurrence of Chest pain since, endorses occ palpitations   . Anxiety   . Cancer ([place]   left breast ca- had bil mast  . DEPRESSION 04/23/2008  . DJD (degenerative joint disease)   . Family history of breast cancer   . Hyperplastic colon polyp    x2  . Hypertension   . HYPOTHYROIDISM 10/17/2007  . Osteoarthritis   . Palpitations    freq at night;  at pre-op states she drinks caffeine and that makes it worse; says the palpitations started after they took her thyroid   . PAT 10/27/2009   Qualifier: Diagnosis of  By: MAundra Dubin MD, [name]    . Pneumonia    "in the past, havent had it in years"  . PONV (postoperative nausea and vomiting)    only after 1979 surgery  . RA [place]umatoid arthritis) ([facility] [place]    "problems in feet, hands, and knees"  . 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 SVT (supraventricular tachycardia) ([facility])    chronic   . Tubulovillous adenoma of colon    Past Surgical History:  Procedure Laterality Date  . ABDOMINAL HYSTERECTOMY    . APPENDECTOMY    . BUNIONECTOMY  10/2009 right & 02/03/10 left  . COLONOSCOPY W/ POLYPECTOMY  2015  . HAMMER TOE SURGERY     "all toes have pins, done with bunionectomy"  . INGUINAL HERNIA REPAIR    . KNEE CLOSED REDUCTION Left 12/05/2017   Procedure: CLOSED MANIPULATION LEFT KNEE;  Surgeon: OParalee Cancel MD;  Location: [facility];  Service: Orthopedics;  Laterality: Left;  .Marland KitchenMASTECTOMY WITH RADIOACTIVE SEED GUIDED EXCISION AND AXILLARY SENTINEL LYMPH NODE BIOPSY Bilateral 08/21/2018    Procedure: BILATERAL SIMPLE MASTECTOMIES WITH LEFT AXILLARY RADIOACTIVE SEED GUIDED LYMPH NODE EXCISION AND LEFT AXILLARY SENTINEL LYMPH NODE BIOPSY;  Surgeon: CErroll Luna MD;  [facility]  Service: General;  Laterality: Bilateral;  . neck fusion     x3  . PORT-A-CATH REMOVAL Right 05/15/2019   Procedure: REMOVAL PORT-A-CATH;  Surgeon: CErroll Luna MD;  [facility]  Service: General;  Laterality: Right;  . PORTACATH PLACEMENT Right 09/25/2018   Procedure: INSERTION PORT-A-CATH WITH ULTRASOUND ERAS PATHWAY;  Surgeon: CErroll Luna MD;  [facility]  Service: General;  Laterality: Right;  . THYROIDECTOMY  2001   for nodules  . TOE AMPUTATION  1979   6th toe removed from each foot  . TOTAL KNEE ARTHROPLASTY Left 10/21/2014   Procedure: LEFT TOTAL KNEE ARTHROPLASTY;  Surgeon: MMauri Pole MD;  [facility];  Service: Orthopedics;  Laterality: Left;  . TOTAL KNEE ARTHROPLASTY Right 12/05/2017   Procedure: RIGHT TOTAL KNEE ARTHROPLASTY;  Surgeon: OParalee Cancel MD;  [facility];  Service: Orthopedics;  Laterality: Right;  70 mins     ALLERGIES:  Allergies  Allergen Reactions  . Compazine [Prochlorperazine Edisylate] Anxiety  . Hydrocodone-Acetaminophen Itching  . Latex Itching  . Tramadol Hcl Itching and Nausea Only     CURRENT MEDICATIONS:  Outpatient Encounter Medications as of 08/23/2019  Medication Sig  . acetaminophen (TYLENOL) 500 MG tablet Take 1,000 mg by mouth every 6 (six) hours as needed for moderate pain.  Marland Kitchen anastrozole (ARIMIDEX) 1 MG tablet Take 1 tablet (1 mg total) by mouth daily.  . benazepril-hydrochlorthiazide (LOTENSIN HCT) 10-12.5 MG tablet TAKE 1 TABLET BY MOUTH EVERY DAY  . diphenhydrAMINE (BENADRYL) 25 MG tablet Take 25-50 mg by mouth every 6 (six) hours as needed for allergies.  Marland Kitchen ibuprofen (ADVIL) 800 MG tablet Take 1 tablet (800 mg total) by mouth every 8 (eight) hours as needed.  Marland Kitchen  levothyroxine (SYNTHROID) 150 MCG tablet Take 1 tablet (150 mcg total) by mouth daily.  . sertraline (ZOLOFT) 50 MG tablet TAKE 1 TABLET BY MOUTH EVERY DAY  . traZODone (DESYREL) 100 MG tablet Take 1 tablet (100 mg total) by mouth at bedtime as needed for sleep.  . [DISCONTINUED] prochlorperazine (COMPAZINE) 10 MG tablet Take 1 tablet (10 mg total) by mouth every 6 (six) hours as needed (Nausea or vomiting). (Patient not taking: Reported on 11/18/2018)   Facility-Administered Encounter Medications as of 08/23/2019  Medication  . heparin lock flush 100 unit/mL  . sodium chloride flush (NS) 0.9 % injection 10 mL     ONCOLOGIC FAMILY HISTORY:  Family History  Problem Relation Age of Onset  . Breast cancer Mother 35       metastatic to brain, died at 36  . Dementia Father        died at 97  . Heart attack Maternal Grandmother   . Stroke Maternal Grandmother   . Diabetes Maternal Grandmother   . Heart disease Maternal Grandmother   . Thyroid disease Maternal Grandmother   . COPD Maternal Aunt   . Cancer Paternal Uncle        details unk  . Esophageal cancer Neg Hx   . Rectal cancer Neg Hx   . Stomach cancer Neg Hx      GENETIC COUNSELING/TESTING: ***  SOCIAL HISTORY:  YAMARI VENTOLA is /single/married/divorced/widowed/separated and lives alone/with her spouse/family/friend in (city), [place].  She has (#) children and they live in (city).  Ms. Peaden is currently retired/disabled/working part-time/full-time as ***.  She denies any current or history of tobacco, alcohol, or illicit drug use.     PHYSICAL EXAMINATION:  Vital Signs:  There were no vitals filed for this visit. There were no vitals filed for this visit. General: Well-nourished, well-appearing female in no acute distress.  She is unaccompanied/accompanied in clinic by her ***** today.   HEENT: Head is normocephalic.  Pupils equal and reactive to light. Conjunctivae clear without exudate.  Sclerae anicteric.  Oral mucosa is pink, moist.  Oropharynx is pink without lesions or erythema.  Lymph: No cervical, supraclavicular, or infraclavicular lymphadenopathy noted on palpation.  Cardiovascular: Regular rate and rhythm.Marland Kitchen Respiratory: Clear to auscultation bilaterally. Chest expansion symmetric; breathing non-labored.  GI: Abdomen soft and round; non-tender, non-distended. Bowel sounds normoactive.  GU: Deferred.  Neuro: No focal deficits. Steady gait.  Psych: Mood and affect normal and appropriate for situation.  Extremities: No edema. MSK: No focal spinal tenderness to palpation.  Full range of motion in bilateral upper extremities Skin: Warm and dry.  LABORATORY DATA:  None for  this visit.  DIAGNOSTIC IMAGING:  None for this visit.      ASSESSMENT AND PLAN:  Ms.. Luther is a pleasant 58 y.o. female with Stage IIA left breast invasive ductal carcinoma, ER+/PR+/HER2-, diagnosed in 06/2018, treated with bilateral mastectomies, adjuvant chemotherapy, adjuvant radiation therapy, and anti-estrogen therapy with Anastrozole beginning in 04/2019.  She presents to the Survivorship Clinic for our initial meeting and routine follow-up post-completion of treatment for breast cancer.    1. Stage IIA left breast cancer:  Ms. Schlotzhauer is continuing to recover from definitive treatment for breast cancer. She will follow-up with her medical oncologist, Dr. Lindi Adie in *** with history and physical exam per surveillance protocol.  She will continue her anti-estrogen therapy with Anastrozle. Thus far, she is tolerating the Anastrozole well, with minimal side effects. Today, a comprehensive survivorship care plan and treatment summary was reviewed with the patient today detailing her breast cancer diagnosis, treatment course, potential late/long-term effects of treatment, appropriate follow-up care with recommendations for the future, and patient education resources.  A copy of this summary, along with a letter will be  sent to the patient's primary care provider via mail/fax/In Basket message after today's visit.    #. Problem(s) at Visit______________  #. Bone health:  Given Ms. Posner age/history of breast cancer and her current treatment regimen including anti-estrogen therapy with Anastrozole, she is at risk for bone demineralization.  Her last DEXA scan was **/**/20**, which showed (results).***  In the meantime, she was encouraged to increase her consumption of foods rich in calcium, as well as increase her weight-bearing activities.  She was given education on specific activities to promote bone health.  #. Cancer screening:  Due to Ms. Goshert's history and her age, she should receive screening for skin cancers, colon cancer, and gynecologic cancers.  The information and recommendations are listed on the patient's comprehensive care plan/treatment summary and were reviewed in detail with the patient.    #. Health maintenance and wellness promotion: Ms. Boehm was encouraged to consume 5-7 servings of fruits and vegetables per day. We reviewed the "Nutrition Rainbow" handout, as well as the handout "Take Control of Your Health and Reduce Your Cancer Risk" from the [employer].  She was also encouraged to engage in moderate to vigorous exercise for 30 minutes per day most days of the week. We discussed the [employer] fitness program, which is designed for cancer survivors to help them become more physically fit after cancer treatments.  She was instructed to limit her alcohol consumption and continue to abstain from tobacco use/***was encouraged stop smoking.     #. Support services/counseling: It is not uncommon for this period of the patient's cancer care trajectory to be one of many emotions and stressors.  We discussed how the current COVID 19 pandemic is impacting this.  I reviewed with Neko our current resources and how to utilize support services during these times, as most of our  classes and support has moved to a virtual platform.      Dispo:   -Return to cancer center in 6 months for f/u with Dr. Lindi Adie  -Bone Density *** -Follow up with surgery *** -She is welcome to return back to the Survivorship Clinic at any time; no additional follow-up needed at this time.  -Consider referral back to survivorship as a long-term survivor for continued surveillance  A total of (30) minutes of face-to-face time was spent with this patient with greater than 50% of that time in  counseling and care-coordination.   Gardenia Phlegm, NP Survivorship Program [facility] [phone]   Note: PRIMARY CARE PROVIDER Caren Macadam, [place] [phone]

## 2019-09-30 ENCOUNTER — Other Ambulatory Visit: Payer: Self-pay | Admitting: Family Medicine

## 2019-09-30 NOTE — Telephone Encounter (Signed)
Rx done. 

## 2019-09-30 NOTE — Telephone Encounter (Signed)
Pt lives in [place] and left her rx at home in [place] but works in [place].  She has to work until Wednesday and cant get her medication until then.

## 2019-10-08 ENCOUNTER — Telehealth: Payer: Self-pay | Admitting: Medical Oncology

## 2019-10-08 NOTE — Telephone Encounter (Signed)
UPBEAT: 12 month LVMOM with patient regarding 12 month study visit. I asked patient to return call to inform me of her availability for scheduling this visit. Patient thanked and return call information provided.  Maxwell Marion, RN, BSN, [facility] Clinical Research 10/08/2019 1:13 PM

## 2019-10-18 ENCOUNTER — Telehealth: Payer: Self-pay | Admitting: Medical Oncology

## 2019-10-18 NOTE — Telephone Encounter (Signed)
UPBEAT: 12 month I spoke with patient regarding the study 12 month assessments are approaching and inquired with her if she was interested in continuing with study participation. Last time we spoke, patient wasn't sure and informed me she would have a better idea at this assessment time point. I informed the patient of what assessments would be collected at this time, including three hour fasting labs, neurocognitive and physical function assessments and questionnaires. Patient was also informed that the study has an updated consent form that I would need to review with her and re-consent her prior to any of the study assessment collection. Patient gave her verbal understanding and provided me with a week that she is available, in the month of February, to complete the 12 month study visit. Patient and I made plans for her and I to meet on February 9th at 0830 for review and re-consent, with fasting labs and assessments to follow. Patient asked for a reminder for the appointment and confirmed it was ok for me to e-mail her (emial is in [place]) the appointment times. I thanked patient for her time and her continued support of study and encouraged her to call me with questions.  Maxwell Marion, RN, BSN, [facility] Clinical Research 10/18/2019 2:55 PM \

## 2019-10-28 ENCOUNTER — Other Ambulatory Visit: Payer: Self-pay | Admitting: Medical Oncology

## 2019-10-28 ENCOUNTER — Telehealth: Payer: Self-pay | Admitting: Medical Oncology

## 2019-10-28 ENCOUNTER — Other Ambulatory Visit: Payer: Self-pay | Admitting: *Deleted

## 2019-10-28 DIAGNOSIS — C50412 Malignant neoplasm of upper-outer quadrant of left female breast: Secondary | ICD-10-CM

## 2019-10-28 DIAGNOSIS — Z170 Estrogen receptor positive status [ER+]: Secondary | ICD-10-CM

## 2019-10-28 NOTE — Telephone Encounter (Signed)
UPBEAT: 12 month LVMOM with patient to remind her of tomorrows 12 month study assessment appointment. I informed patient that I will not need to re-consent her and that she can arrive at [place] for her 0900 am lab appointment. Patient was reminded to fast for three hours prior to her lab appointment and to wear comfortable shoes for the physical function assessments. Patient encouraged to call with questions.  Maxwell Marion, RN, BSN, [facility] Clinical Research 10/28/2019 4:08 PM

## 2019-10-29 ENCOUNTER — Inpatient Hospital Stay: Payer: BC Managed Care – PPO | Attending: Adult Health | Admitting: Medical Oncology

## 2019-10-29 ENCOUNTER — Inpatient Hospital Stay: Payer: BC Managed Care – PPO

## 2019-10-29 ENCOUNTER — Telehealth: Payer: Self-pay | Admitting: Medical Oncology

## 2019-10-29 ENCOUNTER — Inpatient Hospital Stay: Payer: BC Managed Care – PPO | Admitting: Medical Oncology

## 2019-10-29 NOTE — Telephone Encounter (Signed)
UPBEAT LVMOM with patient regarding this morning's appointment for lab at 0900, patient hadn't shown up yet at 0925, asking patient to call me if she is having trouble making today's appointments for her 12 month study assessment for labs and research assessments. Patient thanked, call back number provided.

## 2019-11-12 ENCOUNTER — Telehealth: Payer: Self-pay | Admitting: Family Medicine

## 2019-11-12 ENCOUNTER — Other Ambulatory Visit: Payer: Self-pay | Admitting: Family Medicine

## 2019-11-12 DIAGNOSIS — I671 Cerebral aneurysm, nonruptured: Secondary | ICD-10-CM

## 2019-11-12 NOTE — Telephone Encounter (Signed)
It looks like per Dr. Maudie Mercury in the past she was following with neurology? If she is still seeing neurology, they should be ordering and following her for this. If she is no longer seeing them, then let me know. I can order this in follow up, but I think that we need to also have a visit scheduled to discuss headaches. Please make sure she is not having severe headaches at this time as that would warrant an earlier visit.

## 2019-11-12 NOTE — Telephone Encounter (Signed)
Pt states that she has yearly MRI for her aneurism. She said that it is time for her to be checked b/c she has been having frequent headaches.   Pt can be reached at [phone]

## 2019-11-12 NOTE — Telephone Encounter (Signed)
Ok good; please keep appointment but I did go ahead and order the MRA for follow up to look at vessel on the off chance that this can be completed before our visit. If aneurysm size is stable, then we can discuss headaches and she may not need to see neuro (at least not right away).

## 2019-11-12 NOTE — Telephone Encounter (Signed)
Spoke with the pt and informed her of the message below.  Patient complains of headaches for the past 2-3 months and stated she would not consider these to be severe.  Stated she was seeing Dr Catalina Gravel and has not seen him in years and prior to this she was seen by a neurologist in [place] as she had to have the MRI and visit in order to be cleared for knee surgery.  Stated if referred to another neurologist, she would like to see someone in [place].  Virtual visit scheduled for 2/26 and message sent to PCP.

## 2019-11-14 NOTE — Telephone Encounter (Signed)
I left a detailed message with the information below at the pts cell number. 

## 2019-11-15 ENCOUNTER — Telehealth (INDEPENDENT_AMBULATORY_CARE_PROVIDER_SITE_OTHER): Payer: BC Managed Care – PPO | Admitting: Family Medicine

## 2019-11-15 ENCOUNTER — Other Ambulatory Visit: Payer: Self-pay

## 2019-11-15 ENCOUNTER — Encounter: Payer: Self-pay | Admitting: Family Medicine

## 2019-11-15 DIAGNOSIS — I10 Essential (primary) hypertension: Secondary | ICD-10-CM | POA: Diagnosis not present

## 2019-11-15 DIAGNOSIS — R635 Abnormal weight gain: Secondary | ICD-10-CM

## 2019-11-15 DIAGNOSIS — E039 Hypothyroidism, unspecified: Secondary | ICD-10-CM | POA: Diagnosis not present

## 2019-11-15 DIAGNOSIS — I729 Aneurysm of unspecified site: Secondary | ICD-10-CM

## 2019-11-15 DIAGNOSIS — R252 Cramp and spasm: Secondary | ICD-10-CM

## 2019-11-15 DIAGNOSIS — R739 Hyperglycemia, unspecified: Secondary | ICD-10-CM

## 2019-11-15 NOTE — Progress Notes (Signed)
Virtual Visit via Telephone Note  I connected with Paula Jacobs  on 11/15/19 at 11:00 AM EST by telephone and verified that I am speaking with the correct person using two identifiers.   I discussed the limitations, risks, security and privacy concerns of performing an evaluation and management service by telephone and the availability of in person appointments. I also discussed with the patient that there may be a patient responsible charge related to this service. The patient expressed understanding and agreed to proceed.  Location patient: home Location provider: work office Participants present for the call: patient, provider Patient did not have a visit in the prior 7 days to address this/these issue(s).   History of Present Illness:  Patient called for repeat imaging for cerebral aneurysm. Has this checked yearly. Didn't get it checked last year due to the breast cancer.   States she is not really sure what headaches are coming from. Not sure if related to blood pressure.   Last couple of months started having aggravating headaches about once daily. Goes away if she takes something.  No major changes in diet or lifestyle. Pain in top of head. Not major pain, more just nagging. Ibuprofen or tylenol relieves. No nausea, no vision changes. Comes on through the day. No balance or weakness.    States she is also due for colonoscopy and she will call to set this up.   HTN: most of the time blood pressure has been running well, but then she gained some weight since completing breast treatment. She doesn't have a cuff that she can check with at home, but she is planning on getting one.  Hypothyroid: feels that levothyroxine contributed to weight gain; wondering if there is something else that she can take. Not a great eater; not eating 3 meals/day, but still not losing weight.  Does walk a lot and feels she stays active, and some of weight is due to what she is eating. Worried about  taking this with history of breast cancer.   Stomach is feeling really large right now; notes more after mastectomy.   Does feel like she is retaining fluid, she has difficulty with tolerating diuretic due to potassium loss. Potassium replacement upsets her stomach.  Cramps in hands, feet, calves, even in chest.   Observations/Objective: Patient sounds cheerful and well on the phone. I do not appreciate any SOB. Speech and thought processing are grossly intact. Patient reported vitals: none taken today.  Assessment and Plan:  1. Essential hypertension Uncertain of control. She is going to get cuff and will check and report back to me when I check in with bloodwork results. - CBC with Differential/Platelet; Future - Comprehensive metabolic panel; Future  2. Aneurysm [facility]) MRA has been ordered to reassess.  3. Hypothyroidism, unspecified type Interested in getting on different thyroid therapy.  - TSH; Future - T4, free; Future  4. Weight gain Concerned with weight gain in spite of eating healthy and regular activity level. - Insulin, Free and Total; Future  5. Hyperglycemia - Hemoglobin A1c; Future  6. Muscle cramps Discussed considering electrolyte water.  - Magnesium, RBC; Future - IBC + Ferritin; Future - Vitamin B12; Future - VITAMIN D 25 Hydroxy (Vit-D Deficiency, Fractures); Future   Follow Up Instructions: Return for bloodwork, MRI; follow up pending results.   [id number] [id number] [id number] 11-20 [id number] 21-30 I did not refer this patient for an OV in the next 24 hours for this/these issue(s).  I discussed the assessment and treatment  plan with the patient. The patient was provided an opportunity to ask questions and all were answered. The patient agreed with the plan and demonstrated an understanding of the instructions.   The patient was advised to call back or seek an in-person evaluation if the symptoms worsen or if the condition fails to improve as anticipated.  I  provided 32 minutes of non-face-to-face time during this encounter.   Micheline Rough, MD

## 2019-11-18 ENCOUNTER — Telehealth: Payer: Self-pay | Admitting: Medical Oncology

## 2019-11-18 ENCOUNTER — Telehealth: Payer: Self-pay | Admitting: *Deleted

## 2019-11-18 NOTE — Telephone Encounter (Signed)
-----   Message from Caren Macadam, MD sent at 11/15/2019 11:31 AM EST ----- Please arrange for bloodwork appointment for her

## 2019-11-18 NOTE — Telephone Encounter (Signed)
UPBEAT: Follow-up call to patient regarding missed study assessment. Patient was scheduled to come to clinic for her 12 month on study assessments on 10/29/2019 and did not show. I was able to speak with patient today and patient very apologetic. Patient states that she has a lot "going on with not only work but other appointments" as well. I gave patient my understanding and informed her that if participating in study is causing her additional stress, I understand if she wishes to withdraw from study. Patient states she feels it would be best, due to these new events in her life. I informed patient that I will send her a form in the mail to sign and date, regarding withdrawing from study, and for her to return in the postage paid envelope. All patient's questions answered. I expressed to patient my thanks for her support and contributions to the study and encouraged her to call me or Dr. Lindi Adie with any questions or concerns she may have.  Withdrawal of Consent Letter will be placed in mail tomorrow with postage paid return envelope.

## 2019-11-18 NOTE — Telephone Encounter (Signed)
Spoke with the pt and scheduled a lab appt for 3/3 to arrive at 8:45am for check in.

## 2019-11-19 ENCOUNTER — Other Ambulatory Visit: Payer: Self-pay

## 2019-11-20 ENCOUNTER — Other Ambulatory Visit (INDEPENDENT_AMBULATORY_CARE_PROVIDER_SITE_OTHER): Payer: BC Managed Care – PPO

## 2019-11-20 DIAGNOSIS — R635 Abnormal weight gain: Secondary | ICD-10-CM

## 2019-11-20 DIAGNOSIS — R739 Hyperglycemia, unspecified: Secondary | ICD-10-CM

## 2019-11-20 DIAGNOSIS — I10 Essential (primary) hypertension: Secondary | ICD-10-CM

## 2019-11-20 DIAGNOSIS — R252 Cramp and spasm: Secondary | ICD-10-CM

## 2019-11-20 DIAGNOSIS — E039 Hypothyroidism, unspecified: Secondary | ICD-10-CM

## 2019-11-20 LAB — CBC WITH DIFFERENTIAL/PLATELET
Basophils Absolute: 0.1 10*3/uL (ref 0.0–0.1)
Basophils Relative: 1.2 % (ref 0.0–3.0)
Eosinophils Absolute: 0.2 10*3/uL (ref 0.0–0.7)
Eosinophils Relative: 3.2 % (ref 0.0–5.0)
HCT: 35.8 % — ABNORMAL LOW (ref 36.0–46.0)
Hemoglobin: 11.9 g/dL — ABNORMAL LOW (ref 12.0–15.0)
Lymphocytes Relative: 26 % (ref 12.0–46.0)
Lymphs Abs: 1.2 10*3/uL (ref 0.7–4.0)
MCHC: 33.3 g/dL (ref 30.0–36.0)
MCV: 86.1 fl (ref 78.0–100.0)
Monocytes Absolute: 0.3 10*3/uL (ref 0.1–1.0)
Monocytes Relative: 5.8 % (ref 3.0–12.0)
Neutro Abs: 3 10*3/uL (ref 1.4–7.7)
Neutrophils Relative %: 63.8 % (ref 43.0–77.0)
Platelets: 334 10*3/uL (ref 150.0–400.0)
RBC: 4.16 Mil/uL (ref 3.87–5.11)
RDW: 13.5 % (ref 11.5–15.5)
WBC: 4.8 10*3/uL (ref 4.0–10.5)

## 2019-11-20 LAB — COMPREHENSIVE METABOLIC PANEL
ALT: 9 U/L (ref 0–35)
AST: 13 U/L (ref 0–37)
Albumin: 3.7 g/dL (ref 3.5–5.2)
Alkaline Phosphatase: 53 U/L (ref 39–117)
BUN: 16 mg/dL (ref 6–23)
CO2: 27 mEq/L (ref 19–32)
Calcium: 8.9 mg/dL (ref 8.4–10.5)
Chloride: 104 mEq/L (ref 96–112)
Creatinine, Ser: 0.7 mg/dL (ref 0.40–1.20)
GFR: 104.34 mL/min (ref >60.00)
Glucose, Bld: 87 mg/dL (ref 70–99)
Potassium: 3.9 mEq/L (ref 3.5–5.1)
Sodium: 139 mEq/L (ref 135–145)
Total Bilirubin: 0.4 mg/dL (ref 0.2–1.2)
Total Protein: 6.3 g/dL (ref 6.0–8.3)

## 2019-11-20 LAB — IBC + FERRITIN
Ferritin: 34.4 ng/mL (ref 10.0–291.0)
Iron: 81 ug/dL (ref 42–145)
Saturation Ratios: 22.1 % (ref 20.0–50.0)
Transferrin: 262 mg/dL (ref 212.0–360.0)

## 2019-11-20 LAB — HEMOGLOBIN A1C: Hgb A1c MFr Bld: 4.9 % (ref 4.6–6.5)

## 2019-11-20 LAB — TSH: TSH: 18.69 u[IU]/mL — ABNORMAL HIGH (ref 0.35–4.50)

## 2019-11-20 LAB — T4, FREE: Free T4: 0.78 ng/dL (ref 0.60–1.60)

## 2019-11-20 LAB — VITAMIN B12: Vitamin B-12: 213 pg/mL (ref 211–911)

## 2019-11-20 LAB — VITAMIN D 25 HYDROXY (VIT D DEFICIENCY, FRACTURES): VITD: 14.94 ng/mL — ABNORMAL LOW (ref 30.00–100.00)

## 2019-11-21 ENCOUNTER — Ambulatory Visit: Payer: BC Managed Care – PPO | Attending: Internal Medicine

## 2019-11-21 DIAGNOSIS — Z23 Encounter for immunization: Secondary | ICD-10-CM

## 2019-11-21 MED ORDER — VITAMIN D (ERGOCALCIFEROL) 1.25 MG (50000 UNIT) PO CAPS: 50000.0000 [IU] | ORAL_CAPSULE | ORAL | 1 refills | Status: DC

## 2019-11-21 NOTE — Progress Notes (Signed)
   Covid-19 Vaccination Clinic  Name:  Paula Jacobs    MRN: [id number] DOB: 06/24/1961  11/21/2019  [name] was observed post Covid-19 immunization for 15 minutes without incident. She was provided with Vaccine Information Sheet and instruction to access the V-Safe system.   [name] was instructed to call 911 with any severe reactions post vaccine: Marland Kitchen Difficulty breathing  . Swelling of face and throat  . A fast heartbeat  . A bad rash all over body  . Dizziness and weakness   Immunizations Administered    Name Date Dose VIS Date Route   Pfizer COVID-19 Vaccine 11/21/2019  5:46 PM 0.3 mL 08/30/2019 Intramuscular   Manufacturer: [place]   Lot: [id number]   [place]: [id number]

## 2019-11-22 LAB — MAGNESIUM, RBC: Magnesium RBC: 6.2 mg/dL (ref 4.0–6.4)

## 2019-11-22 LAB — EXTRA SPECIMEN

## 2019-11-25 ENCOUNTER — Other Ambulatory Visit: Payer: Self-pay | Admitting: Family Medicine

## 2019-11-25 DIAGNOSIS — I10 Essential (primary) hypertension: Secondary | ICD-10-CM

## 2019-11-26 ENCOUNTER — Other Ambulatory Visit: Payer: Self-pay | Admitting: Family Medicine

## 2019-11-26 DIAGNOSIS — E039 Hypothyroidism, unspecified: Secondary | ICD-10-CM

## 2019-11-26 MED ORDER — SYNTHROID 175 MCG PO TABS: 175.0000 ug | ORAL_TABLET | Freq: Every day | ORAL | 1 refills | Status: DC

## 2019-11-27 ENCOUNTER — Telehealth: Payer: Self-pay | Admitting: Family Medicine

## 2019-11-27 NOTE — Telephone Encounter (Signed)
Pt is having bad back spasms and would like advice as to what she should do. She would like a call back.

## 2019-11-27 NOTE — Telephone Encounter (Signed)
Spoke with patient. Severe Right lower back spasm. Localized and out of blue. Now is gone. Came when she was sitting at the table with son. She is not good about staying well hydrated. She is about to start new synthroid dose. We talked about working with posture in meanwhile and she will let me know about any recurrence. We discussed work up should it recur (possible xray) and possible treatments- heat, stretching, muscle relaxers.

## 2019-12-02 LAB — INSULIN, FREE AND TOTAL
Free Insulin: 5.3 uU/mL
Total Insulin: 5.7 uU/mL

## 2019-12-03 DIAGNOSIS — Z888 Allergy status to other drugs, medicaments and biological substances status: Secondary | ICD-10-CM | POA: Diagnosis not present

## 2019-12-03 DIAGNOSIS — Z79899 Other long term (current) drug therapy: Secondary | ICD-10-CM | POA: Diagnosis not present

## 2019-12-03 DIAGNOSIS — M85862 Other specified disorders of bone density and structure, left lower leg: Secondary | ICD-10-CM | POA: Diagnosis not present

## 2019-12-03 DIAGNOSIS — E039 Hypothyroidism, unspecified: Secondary | ICD-10-CM | POA: Diagnosis not present

## 2019-12-03 DIAGNOSIS — Z981 Arthrodesis status: Secondary | ICD-10-CM | POA: Diagnosis not present

## 2019-12-03 DIAGNOSIS — Z9013 Acquired absence of bilateral breasts and nipples: Secondary | ICD-10-CM | POA: Diagnosis not present

## 2019-12-03 DIAGNOSIS — I10 Essential (primary) hypertension: Secondary | ICD-10-CM | POA: Diagnosis not present

## 2019-12-03 DIAGNOSIS — Z471 Aftercare following joint replacement surgery: Secondary | ICD-10-CM | POA: Diagnosis not present

## 2019-12-03 DIAGNOSIS — M1990 Unspecified osteoarthritis, unspecified site: Secondary | ICD-10-CM | POA: Diagnosis not present

## 2019-12-03 DIAGNOSIS — Z885 Allergy status to narcotic agent status: Secondary | ICD-10-CM | POA: Diagnosis not present

## 2019-12-03 DIAGNOSIS — M25562 Pain in left knee: Secondary | ICD-10-CM | POA: Diagnosis not present

## 2019-12-03 DIAGNOSIS — Z87891 Personal history of nicotine dependence: Secondary | ICD-10-CM | POA: Diagnosis not present

## 2019-12-03 DIAGNOSIS — Z96652 Presence of left artificial knee joint: Secondary | ICD-10-CM | POA: Diagnosis not present

## 2019-12-05 DIAGNOSIS — T8484XA Pain due to internal orthopedic prosthetic devices, implants and grafts, initial encounter: Secondary | ICD-10-CM | POA: Diagnosis not present

## 2019-12-05 DIAGNOSIS — Z96653 Presence of artificial knee joint, bilateral: Secondary | ICD-10-CM | POA: Diagnosis not present

## 2019-12-09 ENCOUNTER — Other Ambulatory Visit: Payer: Self-pay | Admitting: Orthopedic Surgery

## 2019-12-09 ENCOUNTER — Other Ambulatory Visit (HOSPITAL_COMMUNITY): Payer: Self-pay | Admitting: Orthopedic Surgery

## 2019-12-09 DIAGNOSIS — Z96653 Presence of artificial knee joint, bilateral: Secondary | ICD-10-CM

## 2019-12-16 ENCOUNTER — Telehealth: Payer: Self-pay | Admitting: Family Medicine

## 2019-12-16 MED ORDER — LORAZEPAM 1 MG PO TABS: ORAL_TABLET | ORAL | 0 refills | Status: DC

## 2019-12-16 NOTE — Telephone Encounter (Signed)
I called the pt and informed her the Rx was sent to [facility].

## 2019-12-16 NOTE — Telephone Encounter (Signed)
Pt is going to have a MRI done (12/24/2019) and would like to have some medication due to her being claustrophobic  Pharm:  [facility] on [address] in [place].

## 2019-12-16 NOTE — Telephone Encounter (Signed)
Can you call in lorazepam 1 mg take 1 tablet 1 hour prior to procedure.  I currently have an IT issue and unable to send in controlled medications electronically until this is fixed

## 2019-12-16 NOTE — Addendum Note (Signed)
Addended by: Agnes Lawrence on: 12/16/2019 02:56 PM   Modules accepted: Orders

## 2019-12-18 ENCOUNTER — Ambulatory Visit: Payer: BC Managed Care – PPO | Attending: Internal Medicine

## 2019-12-18 DIAGNOSIS — Z23 Encounter for immunization: Secondary | ICD-10-CM

## 2019-12-18 NOTE — Progress Notes (Signed)
   Covid-19 Vaccination Clinic  Name:  Paula Jacobs    MRN: [id number] DOB: Sep 02, 1961  12/18/2019  [name] was observed post Covid-19 immunization for 15 minutes without incident. She was provided with Vaccine Information Sheet and instruction to access the V-Safe system.   [name] was instructed to call 911 with any severe reactions post vaccine: Marland Kitchen Difficulty breathing  . Swelling of face and throat  . A fast heartbeat  . A bad rash all over body  . Dizziness and weakness   Immunizations Administered    Name Date Dose VIS Date Route   Pfizer COVID-19 Vaccine 12/18/2019  3:57 PM 0.3 mL 08/30/2019 Intramuscular   Manufacturer: [place]   Lot: [id number]   [place]: [id number]

## 2019-12-24 ENCOUNTER — Ambulatory Visit
Admission: RE | Admit: 2019-12-24 | Discharge: 2019-12-24 | Disposition: A | Discharge status: Home / Self Care | Payer: BC Managed Care – PPO | Source: Ambulatory Visit | Attending: Family Medicine | Admitting: Family Medicine

## 2019-12-24 ENCOUNTER — Other Ambulatory Visit: Payer: Self-pay

## 2019-12-24 DIAGNOSIS — I671 Cerebral aneurysm, nonruptured: Secondary | ICD-10-CM | POA: Diagnosis not present

## 2019-12-25 ENCOUNTER — Encounter (HOSPITAL_COMMUNITY): Payer: BC Managed Care – PPO

## 2019-12-25 ENCOUNTER — Ambulatory Visit (HOSPITAL_COMMUNITY): Admission: RE | Admit: 2019-12-25 | Payer: BC Managed Care – PPO | Source: Ambulatory Visit

## 2019-12-25 ENCOUNTER — Other Ambulatory Visit (HOSPITAL_COMMUNITY): Payer: BC Managed Care – PPO

## 2019-12-26 ENCOUNTER — Encounter (HOSPITAL_COMMUNITY): Admission: RE | Admit: 2019-12-26 | Payer: BC Managed Care – PPO | Source: Ambulatory Visit

## 2019-12-26 ENCOUNTER — Encounter (HOSPITAL_COMMUNITY): Payer: BC Managed Care – PPO

## 2019-12-26 ENCOUNTER — Encounter (HOSPITAL_COMMUNITY): Payer: Self-pay

## 2019-12-27 ENCOUNTER — Other Ambulatory Visit: Payer: Self-pay | Admitting: Family Medicine

## 2020-01-02 ENCOUNTER — Ambulatory Visit (HOSPITAL_COMMUNITY): Admission: RE | Admit: 2020-01-02 | Payer: BC Managed Care – PPO | Source: Ambulatory Visit

## 2020-01-08 ENCOUNTER — Ambulatory Visit (HOSPITAL_COMMUNITY): Payer: BC Managed Care – PPO

## 2020-01-08 ENCOUNTER — Other Ambulatory Visit (HOSPITAL_COMMUNITY): Payer: BC Managed Care – PPO

## 2020-01-31 ENCOUNTER — Encounter: Payer: Self-pay | Admitting: Gastroenterology

## 2020-02-23 DIAGNOSIS — W57XXXA Bitten or stung by nonvenomous insect and other nonvenomous arthropods, initial encounter: Secondary | ICD-10-CM | POA: Diagnosis not present

## 2020-02-23 DIAGNOSIS — S40861A Insect bite (nonvenomous) of right upper arm, initial encounter: Secondary | ICD-10-CM | POA: Diagnosis not present

## 2020-03-16 ENCOUNTER — Inpatient Hospital Stay: Payer: BC Managed Care – PPO | Admitting: Hematology and Oncology

## 2020-03-16 NOTE — Assessment & Plan Note (Deleted)
08/21/2018:Bilateral mastectomies: Left mastectomy: IDC grade 3, 2.1 cm, high-grade DCIS, margins negative, negative for LV I, 3/9 lymph nodes positive, ER 95% positive, PR 90% positive, HER-2 -1+, Ki-67 30% T2N1A stage IIa right mastectomy: Benign MammaPrint: High risk luminal type B Echocardiogram 09/18/2018: EF 55 to 60% Patient has been enrolled and upbeat clinical trial: No adverse effects related to the trial  Treatment plan: 1.Adjuvant chemotherapy with dose dense Adriamycin and Cytoxan x4 followed by weekly Taxol x1discontinued due to intolerance 2.Followed by adjuvant radiation completed 04/20/2019 at [facility] 3.  Followed by adjuvant antiestrogen therapy started 05/03/2019 ------------------------------------------------------------------------------------------------------------------ Current Treatment: Anastrozole 1 mg daily started 05/03/2019 Anastrozole toxicities:  Breast cancer surveillance: 1.  Breast exam 03/08/2020: Benign 2.  No role of mammograms because she had bilateral mastectomies  Return to clinic in 1 year for follow-up

## 2020-03-17 ENCOUNTER — Telehealth: Payer: Self-pay | Admitting: Family Medicine

## 2020-03-17 NOTE — Telephone Encounter (Signed)
Patient has lab orders pending for recheck of thyroid. Can she complete this? She was supposed to repeat 6 weeks after last bloodwork and dose adjustment. Increase in dose should have helped with fatigue and weight gain, so I don't think she needs appointment right now, just bloodwork first so we know what to adjust.

## 2020-03-17 NOTE — Telephone Encounter (Signed)
Patient informed of the message below.  Lab appt scheduled for 6/30.

## 2020-03-17 NOTE — Telephone Encounter (Signed)
Patient is needing an appointment to discuss thyroid medication with Dr. Ethlyn Gallery.  Dr. Ethlyn Gallery switched her from levothyroxine (SYNTHROID) 150 MCG tablet to SYNTHROID 175 MCG tablet and not the patient is very tired and gaining weight.  Can we work this patient in this week? She is off work 06/30 if she is not able to get an appointment tomorrow she just needs something soon.  Please advise

## 2020-03-18 ENCOUNTER — Other Ambulatory Visit (INDEPENDENT_AMBULATORY_CARE_PROVIDER_SITE_OTHER): Payer: BC Managed Care – PPO

## 2020-03-18 ENCOUNTER — Other Ambulatory Visit: Payer: Self-pay

## 2020-03-18 DIAGNOSIS — E039 Hypothyroidism, unspecified: Secondary | ICD-10-CM

## 2020-03-18 LAB — TSH: TSH: 2.8 u[IU]/mL (ref 0.35–4.50)

## 2020-03-18 LAB — T4, FREE: Free T4: 0.87 ng/dL (ref 0.60–1.60)

## 2020-03-18 LAB — T3, FREE: T3, Free: 2.7 pg/mL (ref 2.3–4.2)

## 2020-03-18 NOTE — Progress Notes (Signed)
Patient Care Team: Caren Macadam, MD as PCP - General (Family Medicine) Hurley Cisco, MD as Consulting Physician (Rheumatology) [name], MD as Consulting Physician (Orthopedic Surgery) Erroll Luna, MD as Consulting Physician (General Surgery) Nicholas Lose, MD as Consulting Physician (Hematology and Oncology)  DIAGNOSIS:    ICD-10-CM   1. Malignant neoplasm of upper-outer quadrant of left breast in female, estrogen receptor positive ([place])  C50.412    Z17.0     SUMMARY OF ONCOLOGIC HISTORY: Oncology History  Malignant neoplasm of upper-outer quadrant of left breast in female, estrogen receptor positive ([place])  07/13/2018 Initial Diagnosis    Palpable left breast mass, 2 adjacent masses at 2 o'clock position 2 cm biopsy IDC grade 2-3, Ig DCIS, ER 95%, PR 90%, Ki-67 30%, HER-2 -1+; 2:30 position intramammary lymph node 1.3 cm biopsy positive; left axillary lymph node biopsy negative discordant, T2N1, stage IIa   07/25/2018 Cancer Staging   Staging form: Breast, AJCC 8th Edition - Clinical: Stage IIA (cT1c, cN1, cM0, G3, ER+, PR+, HER2-) - Signed by Nicholas Lose, MD on 07/25/2018   08/08/2018 Genetic Testing   Genetic testing performed through [employer]'s Cancernext + RNA insight panel reported out on 08/08/2018 showed no pathogenic mutations. The CancerNext + RNA insight gene panel offered by [employer] includes sequencing and rearrangement analysis for the following 34 genes:   APC*, ATM*, BARD1, BMPR1A, BRCA1*, BRCA2*, BRIP1*, CDH1*, CDK4, CDKN2A, CHEK2*, DICER1,HOXB13, MLH1*, MRE11A, MSH2*, MSH6*, MUTYH*, NBN, NF1*, PALB2*, PMS2*, POLD1, POLE, PTEN*, RAD50, RAD51C*, RAD51D*, SMAD4, SMARCA4, STK11 and TP53* (sequencing and deletion/duplication); EPCAM and GREM1 (deletion/duplication only). DNA and RNA analyses performed for * genes   08/21/2018 Surgery   Bilateral mastectomies: Left mastectomy: IDC grade 3, 2.1 cm, high-grade DCIS, margins negative, negative for LV I,  3/9 lymph nodes positive, ER 95% positive, PR 90% positive, HER-2 -1+, Ki-67 30% T2N1A stage IIa right mastectomy: Benign   09/03/2018 Cancer Staging   Staging form: Breast, AJCC 8th Edition - Pathologic: Stage IIA (pT2, pN1a, cM0, G3, ER+, PR+, HER2-) - Signed by Nicholas Lose, MD on 09/03/2018   09/03/2018 Miscellaneous   MammaPrint: High risk luminal type B, average 10-year risk of recurrence untreated 29%, predicted 5-year benefit of chemotherapy 94.6% distant metastasis free interval   09/26/2018 - 12/04/2018 Chemotherapy   Adjuvant chemotherapy with dose dense Adriamycin and Cytoxan x4 followed by weekly Taxol x1   03/19/2019 - 04/02/2019 Radiation Therapy   Adjuvant XRT at [facility] "Between 03/19/2019 and 04/02/2019 the patient received 5800 cGy in 29 fractions (of a planned 6000 cGy in 30 fractions) to the left chest wall mastectomy scar region. Treatment initially encompassed the left chest wall and left supraclavicular/axillary region delivering 5000 cGy in 25 fractions. The left chest wall was treated using opposed tangential field in field approach with a combination of 6 MV and 23 MV photons. The left supraclavicular/axillary region was treated using a RAO/PA technique with 23 MV photons. This was followed by a boost to the left chest wall mastectomy scar region delivering 800 cGy in 4 fractions (of a planned 1000 cGy in 5 fractions) using an en face technique with a 9 MeV electron beam. The treatment course was completed over 42 elapsed days."   04/2019 -  Anti-estrogen oral therapy   Anastrozole daily     CHIEF COMPLIANT: Follow-up of left breast cancer, complaining of left lower chest wall pain  INTERVAL HISTORY: Paula Jacobs is a 59 y.o. with above-mentioned history of left breast cancer treated with  bilateral mastectomies,adjuvant chemotherapy, radiation, and is currently on antiestrogen therapy with anastrozole. She presents to the clinic todayfor follow-up.    She tells me  that she stopped taking anastrozole a long time ago because she felt nauseated.  She did not inform us of that.  She comes in today because she was experiencing left lower chest wall pain and discomfort.  This has been going on for the past 1 to 2 weeks.  She rates it as 4 out of 10.  Nothing seems to make it better.  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  She tells me that she has been gaining weight because of all the soda that she has been drinking.    ALLERGIES:  is allergic to compazine [prochlorperazine edisylate], hydrocodone-acetaminophen, latex, and tramadol hcl.  MEDICATIONS:  Current Outpatient Medications  Medication Sig Dispense Refill  . acetaminophen (TYLENOL) 500 MG tablet Take 1,000 mg by mouth every 6 (six) hours as needed for moderate pain.    . benazepril-hydrochlorthiazide (LOTENSIN HCT) 10-12.5 MG tablet TAKE 1 TABLET BY MOUTH EVERY DAY 30 tablet 5  . diphenhydrAMINE (BENADRYL) 25 MG tablet Take 25-50 mg by mouth every 6 (six) hours as needed for allergies.    Marland Kitchen ibuprofen (ADVIL) 800 MG tablet Take 1 tablet (800 mg total) by mouth every 8 (eight) hours as needed. 30 tablet 0  . levothyroxine (SYNTHROID) 150 MCG tablet TAKE 1 TABLET BY MOUTH EVERY DAY 57 tablet 1  . LORazepam (ATIVAN) 1 MG tablet Take 1 tablet by mouth 1 hour prior to procedure 1 tablet 0  . SYNTHROID 175 MCG tablet Take 1 tablet (175 mcg total) by mouth daily before breakfast. 90 tablet 1  . Vitamin D, Ergocalciferol, (DRISDOL) 1.25 MG (50000 UNIT) CAPS capsule Take 1 capsule (50,000 Units total) by mouth every 7 (seven) days. 12 capsule 1   No current facility-administered medications for this visit.   Facility-Administered Medications Ordered in Other Visits  Medication Dose Route Frequency Provider Last Rate Last Admin  . heparin lock flush 100 unit/mL  500 Units Intracatheter Once PRN Nicholas Lose, MD      . sodium chloride flush (NS) 0.9 % injection 10 mL  10 mL Intracatheter PRN  Nicholas Lose, MD        PHYSICAL EXAMINATION: ECOG PERFORMANCE STATUS: 1 - Symptomatic but completely ambulatory  Vitals:   03/19/20 0909  BP: (!) 143/87  Pulse: 85  Resp: 18  Temp: 98.7 F (37.1 C)  SpO2: 100%   Filed Weights   03/19/20 0909  Weight: 207 lb 9.6 oz (94.2 kg)    BREAST: Bilateral mastectomies, left lower chest wall tenderness to touch.. (exam performed in the presence of a chaperone)  LABORATORY DATA:  I have reviewed the data as listed CMP Latest Ref Rng & Units 11/20/2019 05/09/2019 12/04/2018  Glucose 70 - 99 mg/dL 87 107(H) 132(H)  BUN 6 - 23 mg/dL '16 13 8  '$ Creatinine 0.40 - 1.20 mg/dL 0.70 0.64 0.82  Sodium 135 - 145 mEq/L 139 138 140  Potassium 3.5 - 5.1 mEq/L 3.9 3.7 3.7  Chloride 96 - 112 mEq/L 104 103 101  CO2 19 - 32 mEq/L '27 24 26  '$ Calcium 8.4 - 10.5 mg/dL 8.9 9.0 9.2  Total Protein 6.0 - 8.3 g/dL 6.3 7.1 7.4  Total Bilirubin 0.2 - 1.2 mg/dL 0.4 0.8 0.3  Alkaline Phos 39 - 117 U/L 53 60 55  AST 0 - 37 U/L 13 21 25  ALT 0 - 35 U/L 9 14 38    Lab Results  Component Value Date   WBC 4.8 11/20/2019   HGB 11.9 (L) 11/20/2019   HCT 35.8 (L) 11/20/2019   MCV 86.1 11/20/2019   PLT 334.0 11/20/2019   NEUTROABS 3.0 11/20/2019    ASSESSMENT & PLAN:  Malignant neoplasm of upper-outer quadrant of left breast in female, estrogen receptor positive ([place]) 08/21/2018:Bilateral mastectomies: Left mastectomy: IDC grade 3, 2.1 cm, high-grade DCIS, margins negative, negative for LV I, 3/9 lymph nodes positive, ER 95% positive, PR 90% positive, HER-2 -1+, Ki-67 30% T2N1A stage IIa right mastectomy: Benign MammaPrint: High risk luminal type B Echocardiogram 09/18/2018: EF 55 to 60% Patient has been enrolled and upbeat clinical trial: No adverse effects related to the trial  Treatment plan: 1.Adjuvant chemotherapy with dose dense Adriamycin and Cytoxan x4 followed by weekly Taxol x1discontinued due to intolerance 2.Followed by adjuvant radiation  completed 04/20/2019 at [facility] 3.  Followed by adjuvant antiestrogen therapy started 05/03/2019 ------------------------------------------------------------------------------------------------------------------ Current Treatment: Anastrozole 1 mg daily started 05/03/2019 discontinued soon after because of nausea I discussed with her about switching her to letrozole therapy.  I sent a prescription for letrozole today.  She will take half a tablet daily for 2 weeks then increase to full tablet.  Left lower chest wall discomfort: It is tender to touch I suspect may be costochondritis or intercostal myalgia.  However I would like to obtain a CT of the chest to ensure there is nothing else.  She changed jobs and is working as a Freight forwarder at [employer]. I discussed with her that she needs to quit drinking soda altogether.  I also discussed with her that she needs to cut down on the carbohydrate intake because weight gain is a risk factor for recurrent breast cancer.  Breast cancer surveillance: 1.  Breast exams 03/19/2020: Bilateral mastectomy scars tenderness in the left lower chest wall 2.  No role of imaging studies because she had bilateral mastectomies  MyChart virtual visit after the CT chest to discuss results.    No orders of the defined types were placed in this encounter.  The patient has a good understanding of the overall plan. she agrees with it. she will call with any problems that may develop before the next visit here.  Total time spent: 30 mins including face to face time and time spent for planning, charting and coordination of care  Nicholas Lose, MD 03/19/2020  I, Cloyde Reams Dorshimer, am acting as scribe for Dr. Nicholas Lose.  I have reviewed the above documentation for accuracy and completeness, and I agree with the above.

## 2020-03-19 ENCOUNTER — Inpatient Hospital Stay: Payer: BC Managed Care – PPO | Attending: Hematology and Oncology | Admitting: Hematology and Oncology

## 2020-03-19 ENCOUNTER — Other Ambulatory Visit: Payer: Self-pay

## 2020-03-19 ENCOUNTER — Encounter: Payer: BC Managed Care – PPO | Admitting: Gastroenterology

## 2020-03-19 DIAGNOSIS — Z79811 Long term (current) use of aromatase inhibitors: Secondary | ICD-10-CM | POA: Insufficient documentation

## 2020-03-19 DIAGNOSIS — C50412 Malignant neoplasm of upper-outer quadrant of left female breast: Secondary | ICD-10-CM | POA: Insufficient documentation

## 2020-03-19 DIAGNOSIS — R0789 Other chest pain: Secondary | ICD-10-CM | POA: Diagnosis not present

## 2020-03-19 DIAGNOSIS — R0782 Intercostal pain: Secondary | ICD-10-CM | POA: Diagnosis not present

## 2020-03-19 DIAGNOSIS — Z170 Estrogen receptor positive status [ER+]: Secondary | ICD-10-CM

## 2020-03-19 DIAGNOSIS — Z79899 Other long term (current) drug therapy: Secondary | ICD-10-CM | POA: Insufficient documentation

## 2020-03-19 MED ORDER — LETROZOLE 2.5 MG PO TABS: 2.5000 mg | ORAL_TABLET | Freq: Every day | ORAL | 3 refills | Status: DC

## 2020-03-19 NOTE — Assessment & Plan Note (Signed)
08/21/2018:Bilateral mastectomies: Left mastectomy: IDC grade 3, 2.1 cm, high-grade DCIS, margins negative, negative for LV I, 3/9 lymph nodes positive, ER 95% positive, PR 90% positive, HER-2 -1+, Ki-67 30% T2N1A stage IIa right mastectomy: Benign MammaPrint: High risk luminal type B Echocardiogram 09/18/2018: EF 55 to 60% Patient has been enrolled and upbeat clinical trial: No adverse effects related to the trial  Treatment plan: 1.Adjuvant chemotherapy with dose dense Adriamycin and Cytoxan x4 followed by weekly Taxol x1discontinued due to intolerance 2.Followed by adjuvant radiation completed 04/20/2019 at [facility] 3.  Followed by adjuvant antiestrogen therapy started 05/03/2019 ------------------------------------------------------------------------------------------------------------------ Current Treatment: Anastrozole 1 mg daily started 05/03/2019 Anastrozole toxicities:  Breast cancer surveillance: 1.  Breast exams on 10/09/2019: Benign 2.  No role of imaging studies because she had bilateral mastectomies  Return to clinic in 1 year for follow-up

## 2020-03-24 ENCOUNTER — Other Ambulatory Visit: Payer: Self-pay | Admitting: *Deleted

## 2020-03-24 DIAGNOSIS — C50412 Malignant neoplasm of upper-outer quadrant of left female breast: Secondary | ICD-10-CM

## 2020-03-24 DIAGNOSIS — Z170 Estrogen receptor positive status [ER+]: Secondary | ICD-10-CM

## 2020-03-25 ENCOUNTER — Other Ambulatory Visit: Payer: BC Managed Care – PPO

## 2020-03-26 ENCOUNTER — Telehealth: Payer: Self-pay | Admitting: *Deleted

## 2020-03-26 ENCOUNTER — Ambulatory Visit (HOSPITAL_COMMUNITY): Admission: RE | Admit: 2020-03-26 | Payer: BC Managed Care – PPO | Source: Ambulatory Visit

## 2020-03-26 NOTE — Telephone Encounter (Signed)
Received call from radiology stating pt was a no show for her chest CT this morning.  RN canceled f/u MD apt for tomorrow. RN attempt x1 to contact pt.  No answer.  LVM regarding tomorrow apt being canceled and to call the office to re schedule chest CT.

## 2020-03-27 ENCOUNTER — Inpatient Hospital Stay: Payer: BC Managed Care – PPO | Admitting: Hematology and Oncology

## 2020-04-02 ENCOUNTER — Other Ambulatory Visit: Payer: Self-pay

## 2020-04-02 ENCOUNTER — Encounter: Payer: Self-pay | Admitting: Family Medicine

## 2020-04-02 ENCOUNTER — Ambulatory Visit (INDEPENDENT_AMBULATORY_CARE_PROVIDER_SITE_OTHER): Payer: BC Managed Care – PPO | Admitting: Family Medicine

## 2020-04-02 VITALS — BP 122/82 | HR 60 | Temp 98.2°F | Ht 64.0 in | Wt 209.5 lb

## 2020-04-02 DIAGNOSIS — R5383 Other fatigue: Secondary | ICD-10-CM

## 2020-04-02 DIAGNOSIS — E559 Vitamin D deficiency, unspecified: Secondary | ICD-10-CM | POA: Diagnosis not present

## 2020-04-02 DIAGNOSIS — E538 Deficiency of other specified B group vitamins: Secondary | ICD-10-CM

## 2020-04-02 DIAGNOSIS — M2550 Pain in unspecified joint: Secondary | ICD-10-CM | POA: Diagnosis not present

## 2020-04-02 DIAGNOSIS — E669 Obesity, unspecified: Secondary | ICD-10-CM

## 2020-04-02 NOTE — Patient Instructions (Signed)
Start vitamin B12 SUBLINGUAL 1051mcg daily.

## 2020-04-02 NOTE — Addendum Note (Signed)
Addended by: Marrion Coy on: 04/02/2020 10:02 AM   Modules accepted: Orders

## 2020-04-02 NOTE — Progress Notes (Signed)
Paula Jacobs DOB: 1961-05-14 Encounter date: 04/02/2020  This is a 59 y.o. female who presents with Chief Complaint  Patient presents with  . Follow-up    History of present illness: Wondering why she is always feeling achy, feverish (no fever, just hot); just feeling like she is always fighting something off. Has felt this way since getting thyroid out. Sometimes ibuprofen helps, sometimes not. Feels this way most days. Very few days that she doesn't feel this way. Does have night sweats. Has had this a long time. Feels like she has felt this way since 2001.   Sometimes has more energy than others, but most days just making it through. Feels like she is just trying to power through day, but not energetic.   Sometimes mentally sluggish. Of note, she did start vitamin D replacement, but did not start any B12 supplementation. Feels like she still hasn't processed everything she went through with breast cancer/treatment.  Appetite does fluctuate some, but is hungry at least once a day now.  Notes that she felt better when she had lost weight doing breast cancer treatment. Notes more knee pain with weight gain.   Still walking regularly. Knees always stiff and hurting. She is up on feet and busy all the time. Knows she isn't eating as healthy as she could.   HTN: she is compliant with medications. Not checking pressures at home.   Hypothyroid: taking medication regularly.   Headaches:occasional headache, but nothing major.   Has joint stiffness in the morning; lasts at least an hour. This is worst time of day for her.   Does have some cough with the benazepril. Worse when on lisinopril. Amlodipine caused a lot of swelling.   Allergies  Allergen Reactions  . Compazine [Prochlorperazine Edisylate] Anxiety  . Hydrocodone-Acetaminophen Itching  . Latex Itching  . Tramadol Hcl Itching and Nausea Only   Current Meds  Medication Sig  . acetaminophen (TYLENOL) 500 MG tablet Take  1,000 mg by mouth every 6 (six) hours as needed for moderate pain.  . benazepril-hydrochlorthiazide (LOTENSIN HCT) 10-12.5 MG tablet TAKE 1 TABLET BY MOUTH EVERY DAY  . diphenhydrAMINE (BENADRYL) 25 MG tablet Take 25-50 mg by mouth every 6 (six) hours as needed for allergies.  Marland Kitchen ibuprofen (ADVIL) 800 MG tablet Take 1 tablet (800 mg total) by mouth every 8 (eight) hours as needed.  Marland Kitchen letrozole (FEMARA) 2.5 MG tablet Take 2.5 mg by mouth daily.  Marland Kitchen SYNTHROID 175 MCG tablet Take 1 tablet (175 mcg total) by mouth daily before breakfast.  . Vitamin D, Ergocalciferol, (DRISDOL) 1.25 MG (50000 UNIT) CAPS capsule Take 1 capsule (50,000 Units total) by mouth every 7 (seven) days.    Review of Systems  Constitutional: Positive for fatigue and unexpected weight change (gaining). Negative for chills and fever.  Respiratory: Negative for cough (intermittent), chest tightness, shortness of breath and wheezing.   Cardiovascular: Negative for chest pain, palpitations and leg swelling.    Objective:  BP 122/82 (BP Location: Right Arm, Patient Position: Sitting, Cuff Size: Large)   Pulse 60   Temp 98.2 F (36.8 C) (Oral)   Ht 5\' 4"  (1.626 m)   Wt 209 lb 8 oz (95 kg)   BMI 35.96 kg/m   Weight: 209 lb 8 oz (95 kg)   BP Readings from Last 3 Encounters:  04/02/20 122/82  03/19/20 (!) 143/87  05/15/19 (!) 148/85   Wt Readings from Last 3 Encounters:  04/02/20 209 lb 8 oz (95 kg)  03/19/20 207 lb 9.6 oz (94.2 kg)  05/15/19 186 lb 1.1 oz (84.4 kg)    Physical Exam Constitutional:      General: She is not in acute distress.    Appearance: She is well-developed.  Cardiovascular:     Rate and Rhythm: Normal rate and regular rhythm.     Heart sounds: Normal heart sounds. No murmur heard.  No friction rub.  Pulmonary:     Effort: Pulmonary effort is normal. No respiratory distress.     Breath sounds: Normal breath sounds. No wheezing or rales.  Musculoskeletal:     Right lower leg: No edema.      Left lower leg: No edema.  Neurological:     Mental Status: She is alert and oriented to person, place, and time.  Psychiatric:        Behavior: Behavior normal.     Assessment/Plan  1. B12 deficiency Start oral supplement. - Vitamin B12; Future  2. Vitamin D deficiency Will recheck level - VITAMIN D 25 Hydroxy (Vit-D Deficiency, Fractures); Future  3. Fatigue, unspecified type Starting with bloodwork today. Work on daily exercise with intermittent bursts of challenges.  4. Arthralgia, unspecified joint - ANA; Future - C-reactive protein; Future - Sedimentation rate; Future - Uric acid; Future - Rheumatoid factor; Future  5. Obesity (BMI 30-39.9) - Amb Ref to Medical Weight Management   Return in about 3 months (around 07/03/2020) for Chronic condition visit.    Micheline Rough, MD

## 2020-04-06 LAB — ANTI-NUCLEAR AB-TITER (ANA TITER): ANA Titer 1: 1:40 {titer} — ABNORMAL HIGH

## 2020-04-06 LAB — URIC ACID: Uric Acid, Serum: 4.8 mg/dL (ref 2.5–7.0)

## 2020-04-06 LAB — C-REACTIVE PROTEIN: CRP: 5.2 mg/L (ref <8.0)

## 2020-04-06 LAB — VITAMIN D 25 HYDROXY (VIT D DEFICIENCY, FRACTURES): Vit D, 25-Hydroxy: 46 ng/mL (ref 30–100)

## 2020-04-06 LAB — ANA: Anti Nuclear Antibody (ANA): POSITIVE — AB

## 2020-04-06 LAB — RHEUMATOID FACTOR: Rheumatoid fact SerPl-aCnc: <14 IU/mL (ref <14)

## 2020-04-06 LAB — VITAMIN B12: Vitamin B-12: 324 pg/mL (ref 200–1100)

## 2020-04-06 LAB — SEDIMENTATION RATE: Sed Rate: 14 mm/h (ref 0–30)

## 2020-04-09 ENCOUNTER — Telehealth: Payer: Self-pay | Admitting: Family Medicine

## 2020-04-09 NOTE — Telephone Encounter (Signed)
Pt wants a call back to go over her lab results. She can be reached at   [phone]  Please advise

## 2020-04-09 NOTE — Telephone Encounter (Signed)
Result note completed

## 2020-04-10 NOTE — Addendum Note (Signed)
Addended by: Agnes Lawrence on: 04/10/2020 08:40 AM   Modules accepted: Orders

## 2020-04-10 NOTE — Telephone Encounter (Signed)
See results note. 

## 2020-04-27 ENCOUNTER — Ambulatory Visit (INDEPENDENT_AMBULATORY_CARE_PROVIDER_SITE_OTHER): Payer: Self-pay | Admitting: Bariatrics

## 2020-05-11 ENCOUNTER — Ambulatory Visit (INDEPENDENT_AMBULATORY_CARE_PROVIDER_SITE_OTHER): Payer: Self-pay | Admitting: Bariatrics

## 2020-05-22 ENCOUNTER — Other Ambulatory Visit: Payer: BC Managed Care – PPO

## 2020-05-29 DIAGNOSIS — Z1159 Encounter for screening for other viral diseases: Secondary | ICD-10-CM | POA: Diagnosis not present

## 2020-05-29 DIAGNOSIS — Z20828 Contact with and (suspected) exposure to other viral communicable diseases: Secondary | ICD-10-CM | POA: Diagnosis not present

## 2020-06-08 ENCOUNTER — Other Ambulatory Visit: Payer: Self-pay | Admitting: Family Medicine

## 2020-06-30 ENCOUNTER — Other Ambulatory Visit: Payer: Self-pay | Admitting: Family Medicine

## 2020-06-30 DIAGNOSIS — I10 Essential (primary) hypertension: Secondary | ICD-10-CM

## 2020-06-30 DIAGNOSIS — E039 Hypothyroidism, unspecified: Secondary | ICD-10-CM

## 2020-07-09 DIAGNOSIS — M7989 Other specified soft tissue disorders: Secondary | ICD-10-CM | POA: Diagnosis not present

## 2020-07-09 DIAGNOSIS — S39012A Strain of muscle, fascia and tendon of lower back, initial encounter: Secondary | ICD-10-CM | POA: Diagnosis not present

## 2020-07-09 DIAGNOSIS — M79661 Pain in right lower leg: Secondary | ICD-10-CM | POA: Diagnosis not present

## 2020-07-09 DIAGNOSIS — I10 Essential (primary) hypertension: Secondary | ICD-10-CM | POA: Diagnosis not present

## 2020-07-09 DIAGNOSIS — Z87891 Personal history of nicotine dependence: Secondary | ICD-10-CM | POA: Diagnosis not present

## 2020-07-09 DIAGNOSIS — S86911A Strain of unspecified muscle(s) and tendon(s) at lower leg level, right leg, initial encounter: Secondary | ICD-10-CM | POA: Diagnosis not present

## 2020-07-09 DIAGNOSIS — X58XXXA Exposure to other specified factors, initial encounter: Secondary | ICD-10-CM | POA: Diagnosis not present

## 2020-08-12 ENCOUNTER — Other Ambulatory Visit: Payer: Self-pay | Admitting: Family Medicine

## 2020-08-12 DIAGNOSIS — E039 Hypothyroidism, unspecified: Secondary | ICD-10-CM

## 2020-08-19 DIAGNOSIS — Z20822 Contact with and (suspected) exposure to covid-19: Secondary | ICD-10-CM | POA: Diagnosis not present

## 2020-08-25 NOTE — Progress Notes (Deleted)
Office Visit Note  Patient: Paula Jacobs             Date of Birth: May 09, 1961           MRN: [id number]             PCP: Caren Macadam, MD Referring: Caren Macadam, MD Visit Date: 09/08/2020 Occupation: @GUAROCC @  Subjective:  No chief complaint on file.   History of Present Illness: Paula Jacobs is a 59 y.o. female ***   Activities of Daily Living:  Patient reports morning stiffness for *** {minute/hour:19697}.   Patient {ACTIONS;DENIES/REPORTS:21021675::"Denies"} nocturnal pain.  Difficulty dressing/grooming: {ACTIONS;DENIES/REPORTS:21021675::"Denies"} Difficulty climbing stairs: {ACTIONS;DENIES/REPORTS:21021675::"Denies"} Difficulty getting out of chair: {ACTIONS;DENIES/REPORTS:21021675::"Denies"} Difficulty using hands for taps, buttons, cutlery, and/or writing: {ACTIONS;DENIES/REPORTS:21021675::"Denies"}  No Rheumatology ROS completed.   PMFS History:  Patient Active Problem List   Diagnosis Date Noted  . Intractable nausea and vomiting 11/24/2018  . Port-A-Cath in place 09/26/2018  . Breast cancer, left ([place]) 08/22/2018  . Breast cancer, stage 2, left ([place]) 08/21/2018  . Genetic testing 08/08/2018  . Family history of breast cancer   . Malignant neoplasm of upper-outer quadrant of left breast in female, estrogen receptor positive ([place]) 07/19/2018  . 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 Aneurysm ([place]) 10/19/2017  . Morbid obesity ([place]) 10/22/2014  . 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 Rheumatoid arthritis ([place]) 09/26/2013  . Hypertension 01/31/2011  . Hypothyroidism 10/17/2007    Past Medical History:  Diagnosis Date  . ANEMIA-NOS 04/23/2008  . Aneurysm ([place]) 10/19/2017   Evaluated by MRA 10/2017, 30mm, stable  . Anginal pain ([place])    went to ED in april 2018 c/o chest pain over last 2 months ; had EKG, CXR  and troponin negative per physician suspected musculoskeletal  ; dc'd with ibuprofen  and recc f/u with outpatient stress test; see care everywhere ED  visit  ; patient denies recurrence of Chest pain since, endorses occ palpitations   . Anxiety   . Cancer ([place]) 2019   left breast ca- had bil mast  . DEPRESSION 04/23/2008  . DJD (degenerative joint disease)   . Family history of breast cancer   . Hyperplastic colon polyp    x2  . Hypertension   . HYPOTHYROIDISM 10/17/2007  . Osteoarthritis   . Palpitations    freq at night;  at pre-op states she drinks caffeine and that makes it worse; says the palpitations started after they took her thyroid   . PAT 10/27/2009   Qualifier: Diagnosis of  By: [name], MD, [name]    . Pneumonia    "in the past, havent had it in years"  . PONV (postoperative nausea and vomiting)    only after 1979 surgery  . RA (rheumatoid arthritis) ([place])    "problems in feet, hands, and knees"  . 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 SVT (supraventricular tachycardia) (HCC)    chronic   . Tubulovillous adenoma of colon     Family History  Problem Relation Age of Onset  . Breast cancer Mother 23       metastatic to brain, died at 96  . Dementia Father        died at 5  . Heart attack Maternal Grandmother   . Stroke Maternal Grandmother   . Diabetes Maternal Grandmother   . Heart disease Maternal Grandmother   . Thyroid disease Maternal Grandmother   . COPD Maternal Aunt   . Cancer Paternal  Uncle        details unk  . Esophageal cancer Neg Hx   . Rectal cancer Neg Hx   . Stomach cancer Neg Hx    Past Surgical History:  Procedure Laterality Date  . ABDOMINAL HYSTERECTOMY    . APPENDECTOMY    . BUNIONECTOMY  10/2009 right & 02/03/10 left  . COLONOSCOPY W/ POLYPECTOMY  2015  . HAMMER TOE SURGERY     "all toes have pins, done with bunionectomy"  . INGUINAL HERNIA REPAIR    . KNEE CLOSED REDUCTION Left 12/05/2017   Procedure: CLOSED MANIPULATION LEFT KNEE;  Surgeon: [name], MD;  Location: [facility];  Service: Orthopedics;  Laterality: Left;  Marland Kitchen MASTECTOMY WITH RADIOACTIVE SEED GUIDED  EXCISION AND AXILLARY SENTINEL LYMPH NODE BIOPSY Bilateral 08/21/2018   Procedure: BILATERAL SIMPLE MASTECTOMIES WITH LEFT AXILLARY RADIOACTIVE SEED GUIDED LYMPH NODE EXCISION AND LEFT AXILLARY SENTINEL LYMPH NODE BIOPSY;  Surgeon: Erroll Luna, MD;  Location: [place] [facility];  Service: General;  Laterality: Bilateral;  . neck fusion     x3  . PORT-A-CATH REMOVAL Right 05/15/2019   Procedure: REMOVAL PORT-A-CATH;  Surgeon: Erroll Luna, MD;  Location: [facility];  Service: General;  Laterality: Right;  . PORTACATH PLACEMENT Right 09/25/2018   Procedure: INSERTION PORT-A-CATH WITH ULTRASOUND ERAS PATHWAY;  Surgeon: Erroll Luna, MD;  Location: [facility];  Service: General;  Laterality: Right;  . THYROIDECTOMY  2001   for nodules  . TOE AMPUTATION  1979   6th toe removed from each foot  . TOTAL KNEE ARTHROPLASTY Left 10/21/2014   Procedure: LEFT TOTAL KNEE ARTHROPLASTY;  Surgeon: [name], MD;  Location: [facility];  Service: Orthopedics;  Laterality: Left;  . 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 Married - 2006  1 son '92  1 dtr '94. Work - at [employer].   Immunization History  Administered Date(s) Administered  . Influenza,inj,Quad PF,6+ Mos 06/02/2015, 10/12/2017  . Influenza,inj,quad, With Preservative 10/12/2017  . PFIZER SARS-COV-2 Vaccination 11/21/2019, 12/18/2019  . PPD Test 07/23/2015, 08/06/2015  . Td 03/10/2010     Objective: Vital Signs: There were no vitals taken for this visit.   Physical Exam   Musculoskeletal Exam: ***  CDAI Exam: CDAI Score: -- Patient Global: --; Provider Global: -- Swollen: --; Tender: -- Joint Exam 09/08/2020   No joint exam has been documented for this visit   There is currently no  information documented on the homunculus. Go to the Rheumatology activity and complete the homunculus joint exam.  Investigation: No additional findings.  Imaging: No results found.  Recent Labs: Lab Results  Component Value Date   WBC 4.8 11/20/2019   HGB 11.9 (L) 11/20/2019   PLT 334.0 11/20/2019   NA 139 11/20/2019   K 3.9 11/20/2019   CL 104 11/20/2019   CO2 27 11/20/2019   GLUCOSE 87 11/20/2019   BUN 16 11/20/2019   CREATININE 0.70 11/20/2019   BILITOT 0.4 11/20/2019   ALKPHOS 53 11/20/2019   AST 13 11/20/2019   ALT 9 11/20/2019   PROT 6.3 11/20/2019   ALBUMIN 3.7 11/20/2019   CALCIUM 8.9 11/20/2019   GFRAA >60 05/09/2019    Speciality Comments: No specialty comments available.  Procedures:  No procedures performed  Allergies: Compazine [prochlorperazine edisylate], Hydrocodone-acetaminophen, Latex, and Tramadol hcl   Assessment / Plan:     Visit Diagnoses: Polyarthralgia - 04/02/20: ANA 1:40NS, RF<14, uric acid 4.8, ESR 14, CRP 5.2, vitamin D 46, Vitamin B12 324  Other fatigue  Primary hypertension  Aneurysm (HCC)  S/P left TKA  S/P right TKA  Malignant neoplasm of upper-outer quadrant of left breast in female, estrogen receptor positive (HCC)  Breast cancer, stage 2, left ([place])  Port-A-Cath in place  Family history of breast cancer  History of hypothyroidism  Orders: No orders of the defined types were placed in this encounter.  No orders of the defined types were placed in this encounter.   Face-to-face time spent with patient was *** minutes. Greater than 50% of time was spent in counseling and coordination of care.  Follow-Up Instructions: No follow-ups on file.   Ofilia Neas, PA-C  Note - This record has been created using Dragon software.  Chart creation errors have been sought, but may not always  have been located. Such creation errors do not reflect on  the standard of medical care.

## 2020-09-03 DIAGNOSIS — Z1159 Encounter for screening for other viral diseases: Secondary | ICD-10-CM | POA: Diagnosis not present

## 2020-09-03 DIAGNOSIS — Z20828 Contact with and (suspected) exposure to other viral communicable diseases: Secondary | ICD-10-CM | POA: Diagnosis not present

## 2020-09-08 ENCOUNTER — Ambulatory Visit: Payer: BC Managed Care – PPO | Admitting: Rheumatology

## 2020-09-08 DIAGNOSIS — I729 Aneurysm of unspecified site: Secondary | ICD-10-CM

## 2020-09-08 DIAGNOSIS — Z96651 Presence of right artificial knee joint: Secondary | ICD-10-CM

## 2020-09-08 DIAGNOSIS — I10 Essential (primary) hypertension: Secondary | ICD-10-CM

## 2020-09-08 DIAGNOSIS — Z95828 Presence of other vascular implants and grafts: Secondary | ICD-10-CM

## 2020-09-08 DIAGNOSIS — Z803 Family history of malignant neoplasm of breast: Secondary | ICD-10-CM

## 2020-09-08 DIAGNOSIS — C50412 Malignant neoplasm of upper-outer quadrant of left female breast: Secondary | ICD-10-CM

## 2020-09-08 DIAGNOSIS — M2550 Pain in unspecified joint: Secondary | ICD-10-CM

## 2020-09-08 DIAGNOSIS — R5383 Other fatigue: Secondary | ICD-10-CM

## 2020-09-08 DIAGNOSIS — Z96652 Presence of left artificial knee joint: Secondary | ICD-10-CM

## 2020-09-08 DIAGNOSIS — C50912 Malignant neoplasm of unspecified site of left female breast: Secondary | ICD-10-CM

## 2020-09-08 DIAGNOSIS — Z8639 Personal history of other endocrine, nutritional and metabolic disease: Secondary | ICD-10-CM

## 2020-09-17 DIAGNOSIS — R197 Diarrhea, unspecified: Secondary | ICD-10-CM | POA: Diagnosis not present

## 2020-09-17 DIAGNOSIS — M1990 Unspecified osteoarthritis, unspecified site: Secondary | ICD-10-CM | POA: Diagnosis not present

## 2020-09-17 DIAGNOSIS — I10 Essential (primary) hypertension: Secondary | ICD-10-CM | POA: Diagnosis not present

## 2020-09-17 DIAGNOSIS — E039 Hypothyroidism, unspecified: Secondary | ICD-10-CM | POA: Diagnosis not present

## 2020-09-17 DIAGNOSIS — Z87891 Personal history of nicotine dependence: Secondary | ICD-10-CM | POA: Diagnosis not present

## 2020-09-17 DIAGNOSIS — Z79899 Other long term (current) drug therapy: Secondary | ICD-10-CM | POA: Diagnosis not present

## 2020-09-17 DIAGNOSIS — Z91040 Latex allergy status: Secondary | ICD-10-CM | POA: Diagnosis not present

## 2020-09-17 DIAGNOSIS — Z885 Allergy status to narcotic agent status: Secondary | ICD-10-CM | POA: Diagnosis not present

## 2020-09-17 DIAGNOSIS — R110 Nausea: Secondary | ICD-10-CM | POA: Diagnosis not present

## 2020-09-17 DIAGNOSIS — Z20822 Contact with and (suspected) exposure to covid-19: Secondary | ICD-10-CM | POA: Diagnosis not present

## 2020-09-17 DIAGNOSIS — Z888 Allergy status to other drugs, medicaments and biological substances status: Secondary | ICD-10-CM | POA: Diagnosis not present

## 2020-10-01 ENCOUNTER — Ambulatory Visit: Payer: BC Managed Care – PPO | Admitting: Rheumatology

## 2020-10-01 DIAGNOSIS — Z1159 Encounter for screening for other viral diseases: Secondary | ICD-10-CM | POA: Diagnosis not present

## 2020-10-01 DIAGNOSIS — Z20828 Contact with and (suspected) exposure to other viral communicable diseases: Secondary | ICD-10-CM | POA: Diagnosis not present

## 2020-10-06 ENCOUNTER — Telehealth (INDEPENDENT_AMBULATORY_CARE_PROVIDER_SITE_OTHER): Payer: BC Managed Care – PPO | Admitting: Internal Medicine

## 2020-10-06 ENCOUNTER — Other Ambulatory Visit: Payer: Self-pay

## 2020-10-06 DIAGNOSIS — R059 Cough, unspecified: Secondary | ICD-10-CM

## 2020-10-06 DIAGNOSIS — U071 COVID-19: Secondary | ICD-10-CM | POA: Diagnosis not present

## 2020-10-06 MED ORDER — BENZONATATE 200 MG PO CAPS: 200.0000 mg | ORAL_CAPSULE | Freq: Two times a day (BID) | ORAL | 0 refills | Status: DC | PRN

## 2020-10-06 NOTE — Progress Notes (Signed)
Virtual Visit via Telephone Note  I connected with Paula Jacobs on 10/06/20 at  3:30 PM EST by telephone and verified that I am speaking with the correct person using two identifiers.   I discussed the limitations, risks, security and privacy concerns of performing an evaluation and management service by telephone and the availability of in person appointments. I also discussed with the patient that there may be a patient responsible charge related to this service. The patient expressed understanding and agreed to proceed.  Location patient: home Location provider: work office Participants present for the call: patient, provider Patient did not have a visit in the prior 7 days to address this/these issue(s).   History of Present Illness:  On 1/11 she started experiencing body aches, headache and cough.  She did not get tested for COVID until 1/14 and unfortunately resulted positive.  She has not had any fevers or shortness of breath.  She has tried Mucinex, lemon and honey tea, over-the-counter cough syrups and drops.  Unfortunately the cough is not significant.  She is unable to sleep.   Observations/Objective: Patient sounds cheerful and well on the phone. I do not appreciate any increased work of breathing. Speech and thought processing are grossly intact. Patient reported vitals: None reported   Current Outpatient Medications:  .  benzonatate (TESSALON) 200 MG capsule, Take 1 capsule (200 mg total) by mouth 2 (two) times daily as needed for cough., Disp: 20 capsule, Rfl: 0 .  acetaminophen (TYLENOL) 500 MG tablet, Take 1,000 mg by mouth every 6 (six) hours as needed for moderate pain., Disp: , Rfl:  .  benazepril-hydrochlorthiazide (LOTENSIN HCT) 10-12.5 MG tablet, TAKE 1 TABLET BY MOUTH EVERY DAY, Disp: 30 tablet, Rfl: 3 .  diphenhydrAMINE (BENADRYL) 25 MG tablet, Take 25-50 mg by mouth every 6 (six) hours as needed for allergies., Disp: , Rfl:  .  ibuprofen (ADVIL) 800  MG tablet, Take 1 tablet (800 mg total) by mouth every 8 (eight) hours as needed., Disp: 30 tablet, Rfl: 0 .  letrozole (FEMARA) 2.5 MG tablet, Take 2.5 mg by mouth daily., Disp: , Rfl:  .  SYNTHROID 175 MCG tablet, TAKE 1 TABLET (175 MCG TOTAL) BY MOUTH DAILY BEFORE BREAKFAST., Disp: 30 tablet, Rfl: 1 .  Vitamin D, Ergocalciferol, (DRISDOL) 1.25 MG (50000 UNIT) CAPS capsule, Take 1 capsule (50,000 Units total) by mouth every 7 (seven) days., Disp: 12 capsule, Rfl: 1 No current facility-administered medications for this visit.  Facility-Administered Medications Ordered in Other Visits:  .  heparin lock flush 100 unit/mL, 500 Units, Intracatheter, Once PRN, Lindi Adie, Vinay, MD .  sodium chloride flush (NS) 0.9 % injection 10 mL, 10 mL, Intracatheter, PRN, Nicholas Lose, MD  Review of Systems:  Constitutional: Denies fever, chills, diaphoresis, appetite change. HEENT: Denies photophobia, eye pain, redness, hearing loss, ear pain, congestion, sore throat, rhinorrhea, sneezing, mouth sores, trouble swallowing, neck pain, neck stiffness and tinnitus.   Respiratory: Denies  Cough. Cardiovascular: Denies chest pain, palpitations and leg swelling.  Gastrointestinal: Denies nausea, vomiting, abdominal pain, diarrhea, constipation, blood in stool and abdominal distention.  Genitourinary: Denies dysuria, urgency, frequency, hematuria, flank pain and difficulty urinating.  Endocrine: Denies: hot or cold intolerance, sweats, changes in hair or nails, polyuria, polydipsia. Musculoskeletal: Denies myalgias, back pain, joint swelling, arthralgias and gait problem.  Skin: Denies pallor, rash and wound.  Neurological: Denies dizziness, seizures, syncope, weakness, light-headedness, numbness and headaches.  Hematological: Denies adenopathy. Easy bruising, personal or family bleeding history  Psychiatric/Behavioral: Denies suicidal ideation, mood changes, confusion, nervousness, sleep disturbance and  agitation   Assessment and Plan:  COVID-19  Cough   -Unfortunately, she is outside the window for referral for monoclonal antibody/remdesivir treatment. -I have advised continued use of over-the-counter pain relievers, decongestants, antihistamines, cough suppressants, Mucinex. -I will send in a prescription for Tessalon Perles for her to take twice daily as needed for cough. -She knows to proceed to the local emergency department with any shortness of breath or high-grade fevers.    I discussed the assessment and treatment plan with the patient. The patient was provided an opportunity to ask questions and all were answered. The patient agreed with the plan and demonstrated an understanding of the instructions.   The patient was advised to call back or seek an in-person evaluation if the symptoms worsen or if the condition fails to improve as anticipated.  I provided 13 minutes of non-face-to-face time during this encounter.   [name], MD [facility]

## 2020-11-23 DIAGNOSIS — H6123 Impacted cerumen, bilateral: Secondary | ICD-10-CM | POA: Diagnosis not present

## 2020-11-25 DIAGNOSIS — F4312 Post-traumatic stress disorder, chronic: Secondary | ICD-10-CM | POA: Diagnosis not present

## 2020-11-25 DIAGNOSIS — F321 Major depressive disorder, single episode, moderate: Secondary | ICD-10-CM | POA: Diagnosis not present

## 2020-11-25 DIAGNOSIS — F411 Generalized anxiety disorder: Secondary | ICD-10-CM | POA: Diagnosis not present

## 2020-12-10 DIAGNOSIS — F411 Generalized anxiety disorder: Secondary | ICD-10-CM | POA: Diagnosis not present

## 2020-12-10 DIAGNOSIS — F4312 Post-traumatic stress disorder, chronic: Secondary | ICD-10-CM | POA: Diagnosis not present

## 2020-12-10 DIAGNOSIS — F321 Major depressive disorder, single episode, moderate: Secondary | ICD-10-CM | POA: Diagnosis not present

## 2020-12-15 ENCOUNTER — Telehealth: Payer: Self-pay | Admitting: Family Medicine

## 2020-12-15 DIAGNOSIS — F411 Generalized anxiety disorder: Secondary | ICD-10-CM | POA: Diagnosis not present

## 2020-12-15 DIAGNOSIS — F4312 Post-traumatic stress disorder, chronic: Secondary | ICD-10-CM | POA: Diagnosis not present

## 2020-12-15 DIAGNOSIS — F321 Major depressive disorder, single episode, moderate: Secondary | ICD-10-CM | POA: Diagnosis not present

## 2020-12-15 NOTE — Telephone Encounter (Signed)
The patient is in therapy and has been advised to discuss depression medication with her PCP.   Dr. Berenice Bouton first available Virtual appointment is May 20 and her first available in office appointment is May 25.  Can we work this patient in sooner? She said that what she has going on is affecting her job and she really needs help.  Please advise

## 2020-12-16 NOTE — Telephone Encounter (Signed)
Left a detailed message at the pts cell number stating the appt will be entered as below and to call back if this will not work for her.  Message sent to Brittney to add the time slot.

## 2020-12-16 NOTE — Telephone Encounter (Signed)
You can put her in for virtual at 12pm on Friday. Let me know if that doesn't work

## 2020-12-18 ENCOUNTER — Encounter: Payer: Self-pay | Admitting: Family Medicine

## 2020-12-18 ENCOUNTER — Telehealth (INDEPENDENT_AMBULATORY_CARE_PROVIDER_SITE_OTHER): Payer: BC Managed Care – PPO | Admitting: Family Medicine

## 2020-12-18 ENCOUNTER — Other Ambulatory Visit: Payer: Self-pay

## 2020-12-18 DIAGNOSIS — F419 Anxiety disorder, unspecified: Secondary | ICD-10-CM

## 2020-12-18 DIAGNOSIS — I10 Essential (primary) hypertension: Secondary | ICD-10-CM

## 2020-12-18 DIAGNOSIS — G4700 Insomnia, unspecified: Secondary | ICD-10-CM

## 2020-12-18 DIAGNOSIS — F321 Major depressive disorder, single episode, moderate: Secondary | ICD-10-CM

## 2020-12-18 DIAGNOSIS — E039 Hypothyroidism, unspecified: Secondary | ICD-10-CM | POA: Diagnosis not present

## 2020-12-18 MED ORDER — BUSPIRONE HCL 15 MG PO TABS: ORAL_TABLET | ORAL | 2 refills | Status: DC

## 2020-12-18 MED ORDER — TRAZODONE HCL 50 MG PO TABS: 25.0000 mg | ORAL_TABLET | Freq: Every evening | ORAL | 1 refills | Status: DC | PRN

## 2020-12-18 MED ORDER — BUPROPION HCL ER (XL) 150 MG PO TB24: 150.0000 mg | ORAL_TABLET | Freq: Every day | ORAL | 1 refills | Status: DC
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
## 2020-12-23 DIAGNOSIS — F321 Major depressive disorder, single episode, moderate: Secondary | ICD-10-CM | POA: Diagnosis not present

## 2020-12-23 DIAGNOSIS — F411 Generalized anxiety disorder: Secondary | ICD-10-CM | POA: Diagnosis not present

## 2020-12-23 DIAGNOSIS — F4312 Post-traumatic stress disorder, chronic: Secondary | ICD-10-CM | POA: Diagnosis not present

## 2020-12-28 ENCOUNTER — Telehealth: Payer: Self-pay | Admitting: *Deleted

## 2020-12-28 DIAGNOSIS — F321 Major depressive disorder, single episode, moderate: Secondary | ICD-10-CM | POA: Diagnosis not present

## 2020-12-28 DIAGNOSIS — F411 Generalized anxiety disorder: Secondary | ICD-10-CM | POA: Diagnosis not present

## 2020-12-28 DIAGNOSIS — F4312 Post-traumatic stress disorder, chronic: Secondary | ICD-10-CM | POA: Diagnosis not present

## 2020-12-28 NOTE — Telephone Encounter (Signed)
-----   Message from Caren Macadam, MD sent at 12/25/2020 11:41 AM EDT ----- Please set up follow up visit for her in about a month. If no openings; can push out further but let her know to contact me with any med concerns in meanwhile.

## 2020-12-28 NOTE — Telephone Encounter (Signed)
Left a detailed message at the pts cell number with the information below.

## 2021-01-04 DIAGNOSIS — F321 Major depressive disorder, single episode, moderate: Secondary | ICD-10-CM | POA: Diagnosis not present

## 2021-01-04 DIAGNOSIS — F411 Generalized anxiety disorder: Secondary | ICD-10-CM | POA: Diagnosis not present

## 2021-01-04 DIAGNOSIS — F4312 Post-traumatic stress disorder, chronic: Secondary | ICD-10-CM | POA: Diagnosis not present

## 2021-01-09 ENCOUNTER — Other Ambulatory Visit: Payer: Self-pay | Admitting: Family Medicine

## 2021-01-09 DIAGNOSIS — F419 Anxiety disorder, unspecified: Secondary | ICD-10-CM

## 2021-01-11 DIAGNOSIS — F411 Generalized anxiety disorder: Secondary | ICD-10-CM | POA: Diagnosis not present

## 2021-01-11 DIAGNOSIS — F4312 Post-traumatic stress disorder, chronic: Secondary | ICD-10-CM | POA: Diagnosis not present

## 2021-01-11 DIAGNOSIS — F321 Major depressive disorder, single episode, moderate: Secondary | ICD-10-CM | POA: Diagnosis not present

## 2021-01-18 DIAGNOSIS — W57XXXA Bitten or stung by nonvenomous insect and other nonvenomous arthropods, initial encounter: Secondary | ICD-10-CM | POA: Diagnosis not present

## 2021-01-18 DIAGNOSIS — E039 Hypothyroidism, unspecified: Secondary | ICD-10-CM | POA: Diagnosis not present

## 2021-01-18 DIAGNOSIS — L03116 Cellulitis of left lower limb: Secondary | ICD-10-CM | POA: Diagnosis not present

## 2021-01-18 DIAGNOSIS — Z79899 Other long term (current) drug therapy: Secondary | ICD-10-CM | POA: Diagnosis not present

## 2021-01-18 DIAGNOSIS — S90862A Insect bite (nonvenomous), left foot, initial encounter: Secondary | ICD-10-CM | POA: Diagnosis not present

## 2021-01-18 DIAGNOSIS — Z87891 Personal history of nicotine dependence: Secondary | ICD-10-CM | POA: Diagnosis not present

## 2021-01-18 DIAGNOSIS — Z885 Allergy status to narcotic agent status: Secondary | ICD-10-CM | POA: Diagnosis not present

## 2021-01-18 DIAGNOSIS — I10 Essential (primary) hypertension: Secondary | ICD-10-CM | POA: Diagnosis not present

## 2021-01-18 DIAGNOSIS — Z888 Allergy status to other drugs, medicaments and biological substances status: Secondary | ICD-10-CM | POA: Diagnosis not present

## 2021-01-18 DIAGNOSIS — Z91040 Latex allergy status: Secondary | ICD-10-CM | POA: Diagnosis not present

## 2021-01-22 DIAGNOSIS — Z4800 Encounter for change or removal of nonsurgical wound dressing: Secondary | ICD-10-CM | POA: Diagnosis not present

## 2021-01-22 DIAGNOSIS — Z87891 Personal history of nicotine dependence: Secondary | ICD-10-CM | POA: Diagnosis not present

## 2021-01-22 DIAGNOSIS — S90862A Insect bite (nonvenomous), left foot, initial encounter: Secondary | ICD-10-CM | POA: Diagnosis not present

## 2021-01-22 DIAGNOSIS — Z79899 Other long term (current) drug therapy: Secondary | ICD-10-CM | POA: Diagnosis not present

## 2021-01-22 DIAGNOSIS — Z791 Long term (current) use of non-steroidal anti-inflammatories (NSAID): Secondary | ICD-10-CM | POA: Diagnosis not present

## 2021-01-22 DIAGNOSIS — W57XXXD Bitten or stung by nonvenomous insect and other nonvenomous arthropods, subsequent encounter: Secondary | ICD-10-CM | POA: Diagnosis not present

## 2021-01-22 DIAGNOSIS — E039 Hypothyroidism, unspecified: Secondary | ICD-10-CM | POA: Diagnosis not present

## 2021-01-22 DIAGNOSIS — Z79890 Hormone replacement therapy (postmenopausal): Secondary | ICD-10-CM | POA: Diagnosis not present

## 2021-01-22 DIAGNOSIS — Z91040 Latex allergy status: Secondary | ICD-10-CM | POA: Diagnosis not present

## 2021-01-22 DIAGNOSIS — R2242 Localized swelling, mass and lump, left lower limb: Secondary | ICD-10-CM | POA: Diagnosis not present

## 2021-01-22 DIAGNOSIS — S90862D Insect bite (nonvenomous), left foot, subsequent encounter: Secondary | ICD-10-CM | POA: Diagnosis not present

## 2021-01-22 DIAGNOSIS — M1990 Unspecified osteoarthritis, unspecified site: Secondary | ICD-10-CM | POA: Diagnosis not present

## 2021-01-22 DIAGNOSIS — I10 Essential (primary) hypertension: Secondary | ICD-10-CM | POA: Diagnosis not present

## 2021-01-22 DIAGNOSIS — Z886 Allergy status to analgesic agent status: Secondary | ICD-10-CM | POA: Diagnosis not present

## 2021-01-22 DIAGNOSIS — Z888 Allergy status to other drugs, medicaments and biological substances status: Secondary | ICD-10-CM | POA: Diagnosis not present

## 2021-01-22 DIAGNOSIS — S90822A Blister (nonthermal), left foot, initial encounter: Secondary | ICD-10-CM | POA: Diagnosis not present

## 2021-02-01 DIAGNOSIS — F4312 Post-traumatic stress disorder, chronic: Secondary | ICD-10-CM | POA: Diagnosis not present

## 2021-02-01 DIAGNOSIS — F411 Generalized anxiety disorder: Secondary | ICD-10-CM | POA: Diagnosis not present

## 2021-02-01 DIAGNOSIS — F321 Major depressive disorder, single episode, moderate: Secondary | ICD-10-CM | POA: Diagnosis not present

## 2021-02-04 ENCOUNTER — Telehealth: Payer: Self-pay | Admitting: Family Medicine

## 2021-02-04 NOTE — Telephone Encounter (Signed)
Her aneurysm has been stable on imaging from 2015 until the imaging last year. No change in size at all. Just wondering rather than repeating this (typically the screening guidelines with that stability wouldn't indicate another image for a couple more years) if she would like to follow up with neurology for headaches if she is getting that many? May also be reasonable for [place] to discuss in more detail - although that MRA is great for aneurysm evaluation; it may not be best for looking at other areas that could contribute to headache (like sinuses). Please just let me know what she prefers. I am happy to fit her in my schedule to discuss if she would like.

## 2021-02-04 NOTE — Telephone Encounter (Signed)
Patient states she usually gets an MRI for her Paula Jacobs states she is supposed to get one done every year.  She doesn't think she had one done last year.  She is starting to have more headaches lately so is concerned about it.

## 2021-02-05 ENCOUNTER — Other Ambulatory Visit: Payer: Self-pay | Admitting: Family Medicine

## 2021-02-05 DIAGNOSIS — R519 Headache, unspecified: Secondary | ICD-10-CM

## 2021-02-05 DIAGNOSIS — I729 Aneurysm of unspecified site: Secondary | ICD-10-CM

## 2021-02-05 NOTE — Telephone Encounter (Signed)
Referral placed; not sure about wait time. Happy to see her on the interim if she feels neurology appointment is too far out or if headaches getting worse.

## 2021-02-05 NOTE — Telephone Encounter (Signed)
Patient informed of the message below and agreed to call back if needed.   

## 2021-02-05 NOTE — Telephone Encounter (Signed)
Patient informed of the message below.  Patient states she would prefer to see a neurologist, previously seen by Dr Catalina Gravel, he has retired and would like a referral. Patient is aware the referral will be placed and someone will call with appt info.   Message sent to PCP.

## 2021-02-06 ENCOUNTER — Other Ambulatory Visit: Payer: Self-pay | Admitting: Family Medicine

## 2021-02-06 DIAGNOSIS — F419 Anxiety disorder, unspecified: Secondary | ICD-10-CM

## 2021-02-08 ENCOUNTER — Encounter: Payer: Self-pay | Admitting: Neurology

## 2021-02-08 DIAGNOSIS — F411 Generalized anxiety disorder: Secondary | ICD-10-CM | POA: Diagnosis not present

## 2021-02-08 DIAGNOSIS — F321 Major depressive disorder, single episode, moderate: Secondary | ICD-10-CM | POA: Diagnosis not present

## 2021-02-08 DIAGNOSIS — F4312 Post-traumatic stress disorder, chronic: Secondary | ICD-10-CM | POA: Diagnosis not present

## 2021-02-10 ENCOUNTER — Other Ambulatory Visit: Payer: Self-pay | Admitting: Family Medicine

## 2021-02-10 DIAGNOSIS — I10 Essential (primary) hypertension: Secondary | ICD-10-CM

## 2021-03-04 DIAGNOSIS — F411 Generalized anxiety disorder: Secondary | ICD-10-CM | POA: Diagnosis not present

## 2021-03-04 DIAGNOSIS — F4312 Post-traumatic stress disorder, chronic: Secondary | ICD-10-CM | POA: Diagnosis not present

## 2021-03-04 DIAGNOSIS — F321 Major depressive disorder, single episode, moderate: Secondary | ICD-10-CM | POA: Diagnosis not present

## 2021-03-14 DIAGNOSIS — Z20822 Contact with and (suspected) exposure to covid-19: Secondary | ICD-10-CM | POA: Diagnosis not present

## 2021-04-05 DIAGNOSIS — F321 Major depressive disorder, single episode, moderate: Secondary | ICD-10-CM | POA: Diagnosis not present

## 2021-04-05 DIAGNOSIS — F4312 Post-traumatic stress disorder, chronic: Secondary | ICD-10-CM | POA: Diagnosis not present

## 2021-04-05 DIAGNOSIS — F411 Generalized anxiety disorder: Secondary | ICD-10-CM | POA: Diagnosis not present

## 2021-04-15 NOTE — Progress Notes (Deleted)
NEUROLOGY CONSULTATION NOTE  Paula Jacobs MRN: [id number] DOB: 1961-07-01  Referring provider: Micheline Rough, MD Primary care provider: Micheline Rough, MD  Reason for consult:  headaches, cerebral aneurysm  Assessment/Plan:   ***   Subjective:  Paula Jacobs is a 60 year old ***-handed female with HTN, RA, SVT and history of breast cancer who presents for headaches and cerebral aneurysm.  History supplemented by referring provider's note. MRI of brain on 10/29/2010, MRA head on 01/01/2011 and 12/24/2019 personally reviewed.  Onset:  *** Location:  *** Quality:  *** Intensity:  ***.  *** denies new headache, thunderclap headache or severe headache that wakes *** from sleep. Aura:  *** Prodrome:  *** Postdrome:  *** Associated symptoms:  ***.  *** denies associated unilateral numbness or weakness. Duration:  *** Frequency:  *** Frequency of abortive medication: *** Triggers:  *** Relieving factors:  *** Activity:  ***  MRI of brain without contrast on 10/29/2010 showed chronic small vessel ischemic changes.  She has long history of cerebral aneurysm.  MRA of head on 12/24/2019 personally reviewed showed 2 mm right ACA aneurysm, stable compared to imaging going back to MRA on 01/01/2011.  Current NSAIDS/analgesics:  Tylenol, ibuprofen '800mg'$  Current triptans:  none Current ergotamine:  none Current anti-emetic:  none Current muscle relaxants:  none Current Antihypertensive medications:  Lotensin Current Antidepressant medications:  Wellbutrin, trazodone 25-'50mg'$  QHS Current Anticonvulsant medications:  none Current anti-CGRP:  none Current Vitamins/Herbal/Supplements:  D Current Antihistamines/Decongestants:  Benadryl Other therapy:  *** Hormone/birth control:  *** Other medications:  BuSpar  Past NSAIDS/analgesics:  naproxen Past abortive triptans:  *** Past abortive ergotamine:  *** Past muscle relaxants:  Robaxin Past anti-emetic:  Phenergan, Zofran,  Compazine Past antihypertensive medications:  Lotrel Past antidepressant medications:  Effexor, Cymbalta  Past anticonvulsant medications:  *** Past anti-CGRP:  none Past vitamins/Herbal/Supplements:  none Past antihistamines/decongestants:  none Other past therapies:  ***  Caffeine:  *** Alcohol:  *** Smoker:  *** Diet:  *** Exercise:  *** Depression:  ***; Anxiety:  *** Other pain:  *** Sleep hygiene:  *** Family history of headache:  ***     PAST MEDICAL HISTORY: Past Medical History:  Diagnosis Date   ANEMIA-NOS 04/23/2008   Aneurysm ([place]) 10/19/2017   Evaluated by MRA 10/2017, 88m, stable   Anginal pain ([facility]    went to ED in april 2018 c/o chest pain over last 2 months ; had EKG, CXR  and troponin negative per physician suspected musculoskeletal  ; dc'd with ibuprofen  and recc f/u with outpatient stress test; see care everywhere ED visit  ; patient denies recurrence of Chest pain since, endorses occ palpitations    Anxiety    Cancer ([facility] [place]   left breast ca- had bil mast   DEPRESSION 04/23/2008   DJD (degenerative joint disease)    Family history of breast cancer    Hyperplastic colon polyp    x2   Hypertension    HYPOTHYROIDISM 10/17/2007   Osteoarthritis    Palpitations    freq at night;  at pre-op states she drinks caffeine and that makes it worse; says the palpitations started after they took her thyroid    PAT 10/27/2009   Qualifier: Diagnosis of  By: MAundra Dubin MD, [name]     Pneumonia    "in the past, havent had it in years"   PONV (postoperative nausea and vomiting)    only after 1979 surgery   RA (rheumatoid arthritis) ([facility]    "  problems in feet, hands, and knees"   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/05/2017   SVT (supraventricular tachycardia) ([facility])    chronic    Tubulovillous adenoma of colon     PAST SURGICAL HISTORY: Past Surgical History:  Procedure Laterality Date   ABDOMINAL HYSTERECTOMY     APPENDECTOMY     BUNIONECTOMY  10/2009 right  & 02/03/10 left   COLONOSCOPY W/ POLYPECTOMY  2015   HAMMER TOE SURGERY     "all toes have pins, done with bunionectomy"   INGUINAL HERNIA REPAIR     KNEE CLOSED REDUCTION Left 12/05/2017   Procedure: CLOSED MANIPULATION LEFT KNEE;  Surgeon: [name], MD;  Location: [facility];  Service: Orthopedics;  Laterality: Left;   MASTECTOMY WITH RADIOACTIVE SEED GUIDED EXCISION AND AXILLARY SENTINEL LYMPH NODE BIOPSY Bilateral 08/21/2018   Procedure: BILATERAL SIMPLE MASTECTOMIES WITH LEFT AXILLARY RADIOACTIVE SEED GUIDED LYMPH NODE EXCISION AND LEFT AXILLARY SENTINEL LYMPH NODE BIOPSY;  Surgeon: Erroll Luna, MD;  Location: [place];  Service: General;  Laterality: Bilateral;   neck fusion     x3   PORT-A-CATH REMOVAL Right 05/15/2019   Procedure: REMOVAL PORT-A-CATH;  Surgeon: Erroll Luna, MD;  Location: [place];  Service: General;  Laterality: Right;   PORTACATH PLACEMENT Right 09/25/2018   Procedure: INSERTION PORT-A-CATH WITH ULTRASOUND ERAS PATHWAY;  Surgeon: Erroll Luna, MD;  Location: [place];  Service: General;  Laterality: Right;   THYROIDECTOMY  2001   for nodules   TOE AMPUTATION  1979   6th toe removed from each foot   TOTAL KNEE ARTHROPLASTY Left 10/21/2014   Procedure: LEFT TOTAL KNEE ARTHROPLASTY;  Surgeon: [name], MD;  Location: [facility];  Service: Orthopedics;  Laterality: Left;   TOTAL KNEE ARTHROPLASTY Right 12/05/2017   Procedure: RIGHT TOTAL KNEE ARTHROPLASTY;  Surgeon: [name], MD;  Location: [facility];  Service: Orthopedics;  Laterality: Right;  70 mins    MEDICATIONS: Current Outpatient Medications on File Prior to Visit  Medication Sig Dispense Refill   acetaminophen (TYLENOL) 500 MG tablet Take 1,000 mg by mouth every 6 (six) hours as needed for moderate pain.     benazepril-hydrochlorthiazide (LOTENSIN HCT) 10-12.5 MG tablet TAKE 1 TABLET BY MOUTH EVERY DAY 30 tablet 3   buPROPion (WELLBUTRIN XL) 150 MG 24 hr tablet Take 1  tablet (150 mg total) by mouth daily. 90 tablet 1   busPIRone (BUSPAR) 15 MG tablet START WITH 1/2 TABLET TWO TIMES DAILY X2 WEEKS THEN INCREASE TO 1 TABLET TWICE DAILY 180 tablet 0   diphenhydrAMINE (BENADRYL) 25 MG tablet Take 25-50 mg by mouth every 6 (six) hours as needed for allergies.     ibuprofen (ADVIL) 800 MG tablet Take 1 tablet (800 mg total) by mouth every 8 (eight) hours as needed. 30 tablet 0   letrozole (FEMARA) 2.5 MG tablet Take 2.5 mg by mouth daily.     SYNTHROID 175 MCG tablet TAKE 1 TABLET (175 MCG TOTAL) BY MOUTH DAILY BEFORE BREAKFAST. 30 tablet 1   traZODone (DESYREL) 50 MG tablet Take 0.5-1 tablets (25-50 mg total) by mouth at bedtime as needed for sleep. 90 tablet 1   Vitamin D, Ergocalciferol, (DRISDOL) 1.25 MG (50000 UNIT) CAPS capsule Take 1 capsule (50,000 Units total) by mouth every 7 (seven) days. 12 capsule 1   [DISCONTINUED] prochlorperazine (COMPAZINE) 10 MG tablet Take 1 tablet (10 mg total) by mouth every 6 (six) hours as needed (Nausea or vomiting). (  Patient not taking: Reported on 11/18/2018) 30 tablet 1   Current Facility-Administered Medications on File Prior to Visit  Medication Dose Route Frequency Provider Last Rate Last Admin   heparin lock flush 100 unit/mL  500 Units Intracatheter Once PRN Nicholas Lose, MD       sodium chloride flush (NS) 0.9 % injection 10 mL  10 mL Intracatheter PRN Nicholas Lose, MD        ALLERGIES: Allergies  Allergen Reactions   Compazine [Prochlorperazine Edisylate] Anxiety   Hydrocodone-Acetaminophen Itching   Latex Itching   Tramadol Hcl Itching and Nausea Only    FAMILY HISTORY: Family History  Problem Relation Age of Onset   Breast cancer Mother 19       metastatic to brain, died at 38   Dementia Father        died at 17   Heart attack Maternal Grandmother    Stroke Maternal Grandmother    Diabetes Maternal Grandmother    Heart disease Maternal Grandmother    Thyroid disease Maternal Grandmother    COPD  Maternal Aunt    Cancer Paternal Uncle        details unk   Esophageal cancer Neg Hx    Rectal cancer Neg Hx    Stomach cancer Neg Hx     Objective:  *** General: No acute distress.  Patient appears well-groomed.   Head:  Normocephalic/atraumatic Eyes:  fundi examined but not visualized Neck: supple, no paraspinal tenderness, full range of motion Back: No paraspinal tenderness Heart: regular rate and rhythm Lungs: Clear to auscultation bilaterally. Vascular: No carotid bruits. Neurological Exam: Mental status: alert and oriented to person, place, and time, recent and remote memory intact, fund of knowledge intact, attention and concentration intact, speech fluent and not dysarthric, language intact. Cranial nerves: CN I: not tested CN II: pupils equal, round and reactive to light, visual fields intact CN III, IV, VI:  full range of motion, no nystagmus, no ptosis CN V: facial sensation intact. CN VII: upper and lower face symmetric CN VIII: hearing intact CN IX, X: gag intact, uvula midline CN XI: sternocleidomastoid and trapezius muscles intact CN XII: tongue midline Bulk & Tone: normal, no fasciculations. Motor:  muscle strength 5/5 throughout Sensation:  Pinprick, temperature and vibratory sensation intact. Deep Tendon Reflexes:  2+ throughout,  toes downgoing.   Finger to nose testing:  Without dysmetria.   Heel to shin:  Without dysmetria.   Gait:  Normal station and stride.  Romberg negative.    Thank you for allowing me to take part in the care of this patient.  Metta Clines, DO  CC: ***

## 2021-04-16 ENCOUNTER — Ambulatory Visit: Payer: BC Managed Care – PPO | Admitting: Neurology

## 2021-04-26 DIAGNOSIS — F411 Generalized anxiety disorder: Secondary | ICD-10-CM | POA: Diagnosis not present

## 2021-04-26 DIAGNOSIS — F321 Major depressive disorder, single episode, moderate: Secondary | ICD-10-CM | POA: Diagnosis not present

## 2021-04-26 DIAGNOSIS — F4312 Post-traumatic stress disorder, chronic: Secondary | ICD-10-CM | POA: Diagnosis not present

## 2021-05-02 ENCOUNTER — Other Ambulatory Visit: Payer: Self-pay | Admitting: Hematology and Oncology

## 2021-05-03 ENCOUNTER — Telehealth: Payer: Self-pay | Admitting: Hematology and Oncology

## 2021-05-03 ENCOUNTER — Encounter: Payer: Self-pay | Admitting: Hematology and Oncology

## 2021-05-03 NOTE — Telephone Encounter (Signed)
Scheduled appt per 8/15 sch msg. Called pt, kept getting msg saying call could not be completed. Mailed updated calendar to pt.

## 2021-05-04 ENCOUNTER — Telehealth: Payer: Self-pay | Admitting: *Deleted

## 2021-05-04 ENCOUNTER — Other Ambulatory Visit: Payer: Self-pay | Admitting: Hematology and Oncology

## 2021-05-04 ENCOUNTER — Other Ambulatory Visit: Payer: Self-pay | Admitting: Family Medicine

## 2021-05-04 DIAGNOSIS — C50412 Malignant neoplasm of upper-outer quadrant of left female breast: Secondary | ICD-10-CM

## 2021-05-04 DIAGNOSIS — Z170 Estrogen receptor positive status [ER+]: Secondary | ICD-10-CM

## 2021-05-04 DIAGNOSIS — F419 Anxiety disorder, unspecified: Secondary | ICD-10-CM

## 2021-05-04 MED ORDER — LORAZEPAM 0.5 MG PO TABS: 0.5000 mg | ORAL_TABLET | Freq: Once | ORAL | 0 refills | Status: DC | PRN

## 2021-05-04 NOTE — Telephone Encounter (Signed)
[place] returned call. Gave [place] another phone number to use, is having trouble with phone.  Discussed need for CT scan that she cancelled last year. She does want to get it rescheduled. Notified of appt with Dr Lindi Adie.

## 2021-05-04 NOTE — Telephone Encounter (Signed)
Unable to reach. "Call cannot be completed at this time" Pt has not been seen since 03/2020. RN wanted to discuss CT scan that had been ordered last year. Refill sent for Femara, but needs to see Dr Lindi Adie. Appt scheduled for 05/17/21  LM on husband's phone to have her call Dr Geralyn Flash nurse.

## 2021-05-04 NOTE — Telephone Encounter (Signed)
Notified that CT scan has been authorized. 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

## 2021-05-04 NOTE — Progress Notes (Signed)
Sent 2 tablets of lorazepam to be taken ahead of her scan

## 2021-05-10 ENCOUNTER — Other Ambulatory Visit: Payer: Self-pay | Admitting: Hematology and Oncology

## 2021-05-10 DIAGNOSIS — C50412 Malignant neoplasm of upper-outer quadrant of left female breast: Secondary | ICD-10-CM

## 2021-05-10 DIAGNOSIS — Z170 Estrogen receptor positive status [ER+]: Secondary | ICD-10-CM

## 2021-05-12 ENCOUNTER — Other Ambulatory Visit: Payer: Self-pay | Admitting: Hematology and Oncology

## 2021-05-12 ENCOUNTER — Ambulatory Visit (HOSPITAL_COMMUNITY)
Admission: RE | Admit: 2021-05-12 | Discharge: 2021-05-12 | Disposition: A | Discharge status: Home / Self Care | Payer: BC Managed Care – PPO | Source: Ambulatory Visit | Attending: Hematology and Oncology | Admitting: Hematology and Oncology

## 2021-05-12 ENCOUNTER — Other Ambulatory Visit: Payer: Self-pay

## 2021-05-12 DIAGNOSIS — I700 Atherosclerosis of aorta: Secondary | ICD-10-CM | POA: Diagnosis not present

## 2021-05-12 DIAGNOSIS — C50412 Malignant neoplasm of upper-outer quadrant of left female breast: Secondary | ICD-10-CM

## 2021-05-12 DIAGNOSIS — Z170 Estrogen receptor positive status [ER+]: Secondary | ICD-10-CM | POA: Diagnosis not present

## 2021-05-12 DIAGNOSIS — J439 Emphysema, unspecified: Secondary | ICD-10-CM | POA: Diagnosis not present

## 2021-05-16 NOTE — Progress Notes (Signed)
Patient Care Team: Caren Macadam, MD as PCP - General (Family Medicine) Hurley Cisco, MD as Consulting Physician (Rheumatology) [name], MD as Consulting Physician (Orthopedic Surgery) Erroll Luna, MD as Consulting Physician (General Surgery) Nicholas Lose, MD as Consulting Physician (Hematology and Oncology)  DIAGNOSIS:    ICD-10-CM   1. Malignant neoplasm of upper-outer quadrant of left breast in female, estrogen receptor positive ([place])  C50.412    Z17.0       SUMMARY OF ONCOLOGIC HISTORY: Oncology History  Malignant neoplasm of upper-outer quadrant of left breast in female, estrogen receptor positive ([place])  07/13/2018 Initial Diagnosis    Palpable left breast mass, 2 adjacent masses at 2 o'clock position 2 cm biopsy IDC grade 2-3, Ig DCIS, ER 95%, PR 90%, Ki-67 30%, HER-2 -1+; 2:30 position intramammary lymph node 1.3 cm biopsy positive; left axillary lymph node biopsy negative discordant, T2N1, stage IIa   07/25/2018 Cancer Staging   Staging form: Breast, AJCC 8th Edition - Clinical: Stage IIA (cT1c, cN1, cM0, G3, ER+, PR+, HER2-) - Signed by Nicholas Lose, MD on 07/25/2018   08/08/2018 Genetic Testing   Genetic testing performed through [employer]'s Cancernext + RNA insight panel reported out on 08/08/2018 showed no pathogenic mutations. The CancerNext + RNA insight gene panel offered by [employer] includes sequencing and rearrangement analysis for the following 34 genes:   APC*, ATM*, BARD1, BMPR1A, BRCA1*, BRCA2*, BRIP1*, CDH1*, CDK4, CDKN2A, CHEK2*, DICER1,HOXB13, MLH1*, MRE11A, MSH2*, MSH6*, MUTYH*, NBN, NF1*, PALB2*, PMS2*, POLD1, POLE, PTEN*, RAD50, RAD51C*, RAD51D*, SMAD4, SMARCA4, STK11 and TP53* (sequencing and deletion/duplication); EPCAM and GREM1 (deletion/duplication only). DNA and RNA analyses performed for * genes   08/21/2018 Surgery   Bilateral mastectomies: Left mastectomy: IDC grade 3, 2.1 cm, high-grade DCIS, margins negative, negative for LV  I, 3/9 lymph nodes positive, ER 95% positive, PR 90% positive, HER-2 -1+, Ki-67 30% T2N1A stage IIa right mastectomy: Benign   09/03/2018 Cancer Staging   Staging form: Breast, AJCC 8th Edition - Pathologic: Stage IIA (pT2, pN1a, cM0, G3, ER+, PR+, HER2-) - Signed by Nicholas Lose, MD on 09/03/2018   09/03/2018 Miscellaneous   MammaPrint: High risk luminal type B, average 10-year risk of recurrence untreated 29%, predicted 5-year benefit of chemotherapy 94.6% distant metastasis free interval   09/26/2018 - 12/04/2018 Chemotherapy   Adjuvant chemotherapy with dose dense Adriamycin and Cytoxan x4 followed by weekly Taxol x1   03/19/2019 - 04/02/2019 Radiation Therapy   Adjuvant XRT at [facility] "Between 03/19/2019 and 04/02/2019 the patient received 5800 cGy in 29 fractions (of a planned 6000 cGy in 30 fractions) to the left chest wall mastectomy scar region. Treatment initially encompassed the left chest wall and left supraclavicular/axillary region delivering 5000 cGy in 25 fractions. The left chest wall was treated using opposed tangential field in field approach with a combination of 6 MV and 23 MV photons. The left supraclavicular/axillary region was treated using a RAO/PA technique with 23 MV photons. This was followed by a boost to the left chest wall mastectomy scar region delivering 800 cGy in 4 fractions (of a planned 1000 cGy in 5 fractions) using an en face technique with a 9 MeV electron beam. The treatment course was completed over 42 elapsed days."   04/2019 -  Anti-estrogen oral therapy   Anastrozole daily, discontinued soon after starting because of nausea, switched to letrozole 03/19/20     CHIEF COMPLIANT: Follow-up of left breast cancer, complaining of left lower chest wall pain  INTERVAL HISTORY: Paula Jacobs  is a 60 y.o. with above-mentioned history of left breast cancer treated with bilateral mastectomies, adjuvant chemotherapy, radiation, and is currently on antiestrogen  therapy with anastrozole. CT Chest on 05/12/21 showed no evidence of metastatic disease in the chest. She presents to the clinic today for follow-up.   ALLERGIES:  is allergic to compazine [prochlorperazine edisylate], hydrocodone-acetaminophen, latex, and tramadol hcl.  MEDICATIONS:  Current Outpatient Medications  Medication Sig Dispense Refill   acetaminophen (TYLENOL) 500 MG tablet Take 1,000 mg by mouth every 6 (six) hours as needed for moderate pain.     benazepril-hydrochlorthiazide (LOTENSIN HCT) 10-12.5 MG tablet TAKE 1 TABLET BY MOUTH EVERY DAY 30 tablet 3   buPROPion (WELLBUTRIN XL) 150 MG 24 hr tablet Take 1 tablet (150 mg total) by mouth daily. 90 tablet 1   busPIRone (BUSPAR) 15 MG tablet START WITH 1/2 TABLET TWO TIMES DAILY X2 WEEKS THEN INCREASE TO 1 TABLET TWICE DAILY 60 tablet 2   diphenhydrAMINE (BENADRYL) 25 MG tablet Take 25-50 mg by mouth every 6 (six) hours as needed for allergies.     ibuprofen (ADVIL) 800 MG tablet Take 1 tablet (800 mg total) by mouth every 8 (eight) hours as needed. 30 tablet 0   letrozole (FEMARA) 2.5 MG tablet TAKE 1 TABLET(2.5 MG) BY MOUTH DAILY 90 tablet 0   LORazepam (ATIVAN) 0.5 MG tablet Take 1 tablet (0.5 mg total) by mouth once as needed for up to 1 dose for anxiety. Take 1 tab 1 hour before scan and 1 tab 30 mins before scan 2 tablet 0   SYNTHROID 175 MCG tablet TAKE 1 TABLET (175 MCG TOTAL) BY MOUTH DAILY BEFORE BREAKFAST. 30 tablet 1   traZODone (DESYREL) 50 MG tablet Take 0.5-1 tablets (25-50 mg total) by mouth at bedtime as needed for sleep. 90 tablet 1   Vitamin D, Ergocalciferol, (DRISDOL) 1.25 MG (50000 UNIT) CAPS capsule Take 1 capsule (50,000 Units total) by mouth every 7 (seven) days. 12 capsule 1   No current facility-administered medications for this visit.   Facility-Administered Medications Ordered in Other Visits  Medication Dose Route Frequency Provider Last Rate Last Admin   heparin lock flush 100 unit/mL  500 Units  Intracatheter Once PRN Nicholas Lose, MD       sodium chloride flush (NS) 0.9 % injection 10 mL  10 mL Intracatheter PRN Nicholas Lose, MD        PHYSICAL EXAMINATION: ECOG PERFORMANCE STATUS: 1 - Symptomatic but completely ambulatory  Vitals:   05/17/21 1511  BP: (!) 145/89  Pulse: 88  Resp: 18  Temp: 97.8 F (36.6 C)  SpO2: 98%   Filed Weights   05/17/21 1511  Weight: 210 lb 12.8 oz (95.6 kg)    BREAST: No palpable masses or nodules in either right or left breasts. No palpable axillary supraclavicular or infraclavicular adenopathy no breast tenderness or nipple discharge. (exam performed in the presence of a chaperone)  LABORATORY DATA:  I have reviewed the data as listed CMP Latest Ref Rng & Units 11/20/2019 05/09/2019 12/04/2018  Glucose 70 - 99 mg/dL 87 107(H) 132(H)  BUN 6 - 23 mg/dL $Remove'16 13 8  'jjFEwhN$ Creatinine 0.40 - 1.20 mg/dL 0.70 0.64 0.82  Sodium 135 - 145 mEq/L 139 138 140  Potassium 3.5 - 5.1 mEq/L 3.9 3.7 3.7  Chloride 96 - 112 mEq/L 104 103 101  CO2 19 - 32 mEq/L $Remove'27 24 26  'UoJsxHF$ Calcium 8.4 - 10.5 mg/dL 8.9 9.0 9.2  Total Protein 6.0 - 8.3 g/dL  6.3 7.1 7.4  Total Bilirubin 0.2 - 1.2 mg/dL 0.4 0.8 0.3  Alkaline Phos 39 - 117 U/L 53 60 55  AST 0 - 37 U/L $Remo'13 21 25  'cQkKW$ ALT 0 - 35 U/L 9 14 38    Lab Results  Component Value Date   WBC 4.8 11/20/2019   HGB 11.9 (L) 11/20/2019   HCT 35.8 (L) 11/20/2019   MCV 86.1 11/20/2019   PLT 334.0 11/20/2019   NEUTROABS 3.0 11/20/2019    ASSESSMENT & PLAN:  Malignant neoplasm of upper-outer quadrant of left breast in female, estrogen receptor positive ([place]) 08/21/2018:Bilateral mastectomies: Left mastectomy: IDC grade 3, 2.1 cm, high-grade DCIS, margins negative, negative for LV I, 3/9 lymph nodes positive, ER 95% positive, PR 90% positive, HER-2 -1+, Ki-67 30% T2N1A stage IIa right mastectomy: Benign MammaPrint: High risk luminal type B Echocardiogram 09/18/2018: EF 55 to 60% Patient has been enrolled and upbeat clinical trial: No  adverse effects related to the trial   Treatment plan: 1.  Adjuvant chemotherapy with dose dense Adriamycin and Cytoxan x4 followed by weekly Taxol x1 discontinued due to intolerance 2.  Followed by adjuvant radiation completed 04/20/2019 at [facility] 3.  Followed by adjuvant antiestrogen therapy started 05/03/2019 ------------------------------------------------------------------------------------------------------------------ Current Treatment: Anastrozole 1 mg daily started 05/03/2019 discontinued soon after because of nausea Currently on letrozole   Left lower chest wall discomfort: It is tender to touch I suspect may be costochondritis or intercostal myalgia.    CT chest 05/13/2021: No evidence of metastatic disease in the chest I recommended starting her on gabapentin 3 mg nightly.  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  She is excited about this new job which will offer her opportunity to exercise.    Breast cancer surveillance: 1.  Breast exams 03/19/2020: Bilateral mastectomy scars tenderness in the left lower chest wall 2.  No role of routine imaging studies because she had bilateral mastectomies    Return to clinic in 1 year for follow-up    No orders of the defined types were placed in this encounter.  The patient has a good understanding of the overall plan. she agrees with it. she will call with any problems that may develop before the next visit here.  Total time spent: 20 mins including face to face time and time spent for planning, charting and coordination of care  Rulon Eisenmenger, MD, MPH 05/17/2021  I, Thana Ates, am acting as scribe for Dr. Nicholas Lose.  I have reviewed the above documentation for accuracy and completeness, and I agree with the above.

## 2021-05-17 ENCOUNTER — Inpatient Hospital Stay: Payer: BC Managed Care – PPO | Attending: Hematology and Oncology | Admitting: Hematology and Oncology

## 2021-05-17 ENCOUNTER — Other Ambulatory Visit: Payer: Self-pay

## 2021-05-17 DIAGNOSIS — Z9221 Personal history of antineoplastic chemotherapy: Secondary | ICD-10-CM | POA: Insufficient documentation

## 2021-05-17 DIAGNOSIS — C50412 Malignant neoplasm of upper-outer quadrant of left female breast: Secondary | ICD-10-CM | POA: Insufficient documentation

## 2021-05-17 DIAGNOSIS — Z79899 Other long term (current) drug therapy: Secondary | ICD-10-CM | POA: Diagnosis not present

## 2021-05-17 DIAGNOSIS — Z923 Personal history of irradiation: Secondary | ICD-10-CM | POA: Diagnosis not present

## 2021-05-17 DIAGNOSIS — Z9013 Acquired absence of bilateral breasts and nipples: Secondary | ICD-10-CM | POA: Insufficient documentation

## 2021-05-17 DIAGNOSIS — Z79811 Long term (current) use of aromatase inhibitors: Secondary | ICD-10-CM | POA: Insufficient documentation

## 2021-05-17 DIAGNOSIS — Z170 Estrogen receptor positive status [ER+]: Secondary | ICD-10-CM

## 2021-05-17 MED ORDER — GABAPENTIN 300 MG PO CAPS
300.0000 mg | ORAL_CAPSULE | Freq: Every day | ORAL | 3 refills | Status: DC
Start: 2021-05-17 — End: 2022-04-26

## 2021-05-17 NOTE — Assessment & Plan Note (Signed)
08/21/2018:Bilateral mastectomies: Left mastectomy: IDC grade 3, 2.1 cm, high-grade DCIS, margins negative, negative for LV I, 3/9 lymph nodes positive, ER 95% positive, PR 90% positive, HER-2 -1+, Ki-67 30% T2N1A stage IIa right mastectomy: Benign MammaPrint: High risk luminal type B Echocardiogram 09/18/2018: EF 55 to 60% Patient has been enrolled and upbeat clinical trial: No adverse effects related to the trial  Treatment plan: 1.Adjuvant chemotherapy with dose dense Adriamycin and Cytoxan x4 followed by weekly Taxol x1discontinued due to intolerance 2.[facility] 3.Followed by adjuvant antiestrogen therapystarted 05/03/2019 ------------------------------------------------------------------------------------------------------------------ Current Treatment:Anastrozole 1 mg daily started 05/03/2019 discontinued soon after because of nausea I discussed with her about switching her to letrozole therapy.  I sent a prescription for letrozole today.  She will take half a tablet daily for 2 weeks then increase to full tablet.  Left lower chest wall discomfort: It is tender to touch I suspect may be costochondritis or intercostal myalgia.    CT chest 05/13/2021: No evidence of metastatic disease in the chest  She is working as a Freight forwarder at [employer].   Breast cancer surveillance: 1.  Breast exams 03/19/2020: Bilateral mastectomy scars tenderness in the left lower chest wall 2.  No role of routine imaging studies because she had bilateral mastectomies   Return to clinic in 1 year for follow-up

## 2021-05-18 ENCOUNTER — Other Ambulatory Visit: Payer: Self-pay | Admitting: Family Medicine

## 2021-05-18 DIAGNOSIS — F419 Anxiety disorder, unspecified: Secondary | ICD-10-CM

## 2021-07-26 ENCOUNTER — Telehealth: Payer: Self-pay | Admitting: Gastroenterology

## 2021-07-26 ENCOUNTER — Encounter: Payer: Self-pay | Admitting: Family Medicine

## 2021-07-26 ENCOUNTER — Other Ambulatory Visit: Payer: Self-pay

## 2021-07-26 ENCOUNTER — Ambulatory Visit (INDEPENDENT_AMBULATORY_CARE_PROVIDER_SITE_OTHER): Payer: BC Managed Care – PPO | Admitting: Family Medicine

## 2021-07-26 VITALS — BP 118/82 | HR 80 | Temp 98.1°F | Ht 64.0 in | Wt 211.3 lb

## 2021-07-26 DIAGNOSIS — U099 Post covid-19 condition, unspecified: Secondary | ICD-10-CM | POA: Diagnosis not present

## 2021-07-26 DIAGNOSIS — D649 Anemia, unspecified: Secondary | ICD-10-CM

## 2021-07-26 DIAGNOSIS — M069 Rheumatoid arthritis, unspecified: Secondary | ICD-10-CM

## 2021-07-26 DIAGNOSIS — C50412 Malignant neoplasm of upper-outer quadrant of left female breast: Secondary | ICD-10-CM

## 2021-07-26 DIAGNOSIS — E538 Deficiency of other specified B group vitamins: Secondary | ICD-10-CM | POA: Diagnosis not present

## 2021-07-26 DIAGNOSIS — Z170 Estrogen receptor positive status [ER+]: Secondary | ICD-10-CM

## 2021-07-26 DIAGNOSIS — Z23 Encounter for immunization: Secondary | ICD-10-CM | POA: Diagnosis not present

## 2021-07-26 DIAGNOSIS — E559 Vitamin D deficiency, unspecified: Secondary | ICD-10-CM | POA: Diagnosis not present

## 2021-07-26 DIAGNOSIS — D519 Vitamin B12 deficiency anemia, unspecified: Secondary | ICD-10-CM | POA: Diagnosis not present

## 2021-07-26 DIAGNOSIS — I10 Essential (primary) hypertension: Secondary | ICD-10-CM | POA: Diagnosis not present

## 2021-07-26 DIAGNOSIS — R739 Hyperglycemia, unspecified: Secondary | ICD-10-CM

## 2021-07-26 DIAGNOSIS — Z13220 Encounter for screening for lipoid disorders: Secondary | ICD-10-CM | POA: Diagnosis not present

## 2021-07-26 DIAGNOSIS — R002 Palpitations: Secondary | ICD-10-CM | POA: Diagnosis not present

## 2021-07-26 DIAGNOSIS — E039 Hypothyroidism, unspecified: Secondary | ICD-10-CM | POA: Diagnosis not present

## 2021-07-26 DIAGNOSIS — F321 Major depressive disorder, single episode, moderate: Secondary | ICD-10-CM

## 2021-07-26 DIAGNOSIS — M79642 Pain in left hand: Secondary | ICD-10-CM

## 2021-07-26 LAB — CBC WITH DIFFERENTIAL/PLATELET
Basophils Absolute: 0 10*3/uL (ref 0.0–0.1)
Basophils Relative: 0.6 % (ref 0.0–3.0)
Eosinophils Absolute: 0.1 10*3/uL (ref 0.0–0.7)
Eosinophils Relative: 1.5 % (ref 0.0–5.0)
HCT: 37.1 % (ref 36.0–46.0)
Hemoglobin: 11.9 g/dL — ABNORMAL LOW (ref 12.0–15.0)
Lymphocytes Relative: 24.5 % (ref 12.0–46.0)
Lymphs Abs: 1.6 10*3/uL (ref 0.7–4.0)
MCHC: 32.2 g/dL (ref 30.0–36.0)
MCV: 84.5 fl (ref 78.0–100.0)
Monocytes Absolute: 0.3 10*3/uL (ref 0.1–1.0)
Monocytes Relative: 4.6 % (ref 3.0–12.0)
Neutro Abs: 4.6 10*3/uL (ref 1.4–7.7)
Neutrophils Relative %: 68.8 % (ref 43.0–77.0)
Platelets: 391 10*3/uL (ref 150.0–400.0)
RBC: 4.39 Mil/uL (ref 3.87–5.11)
RDW: 13.8 % (ref 11.5–15.5)
WBC: 6.7 10*3/uL (ref 4.0–10.5)

## 2021-07-26 LAB — VITAMIN B12: Vitamin B-12: 237 pg/mL (ref 211–911)

## 2021-07-26 LAB — COMPREHENSIVE METABOLIC PANEL
ALT: 14 U/L (ref 0–35)
AST: 15 U/L (ref 0–37)
Albumin: 4.2 g/dL (ref 3.5–5.2)
Alkaline Phosphatase: 63 U/L (ref 39–117)
BUN: 21 mg/dL (ref 6–23)
CO2: 30 mEq/L (ref 19–32)
Calcium: 9.4 mg/dL (ref 8.4–10.5)
Chloride: 101 mEq/L (ref 96–112)
Creatinine, Ser: 0.64 mg/dL (ref 0.40–1.20)
GFR: 97.2 mL/min (ref >60.00)
Glucose, Bld: 92 mg/dL (ref 70–99)
Potassium: 3.7 mEq/L (ref 3.5–5.1)
Sodium: 138 mEq/L (ref 135–145)
Total Bilirubin: 0.5 mg/dL (ref 0.2–1.2)
Total Protein: 7.2 g/dL (ref 6.0–8.3)

## 2021-07-26 LAB — LIPID PANEL
Cholesterol: 176 mg/dL (ref 0–200)
HDL: 53.8 mg/dL (ref >39.00)
LDL Cholesterol: 97 mg/dL (ref 0–99)
NonHDL: 122.01
Total CHOL/HDL Ratio: 3
Triglycerides: 123 mg/dL (ref 0.0–149.0)
VLDL: 24.6 mg/dL (ref 0.0–40.0)

## 2021-07-26 LAB — TSH: TSH: 0.55 u[IU]/mL (ref 0.35–5.50)

## 2021-07-26 LAB — IBC + FERRITIN
Ferritin: 62.3 ng/mL (ref 10.0–291.0)
Iron: 74 ug/dL (ref 42–145)
Saturation Ratios: 18.4 % — ABNORMAL LOW (ref 20.0–50.0)
TIBC: 403.2 ug/dL (ref 250.0–450.0)
Transferrin: 288 mg/dL (ref 212.0–360.0)

## 2021-07-26 LAB — T3, FREE: T3, Free: 2.9 pg/mL (ref 2.3–4.2)

## 2021-07-26 LAB — HEMOGLOBIN A1C: Hgb A1c MFr Bld: 5.2 % (ref 4.6–6.5)

## 2021-07-26 LAB — T4, FREE: Free T4: 1.04 ng/dL (ref 0.60–1.60)

## 2021-07-26 LAB — SEDIMENTATION RATE: Sed Rate: 48 mm/hr — ABNORMAL HIGH (ref 0–30)

## 2021-07-26 LAB — VITAMIN D 25 HYDROXY (VIT D DEFICIENCY, FRACTURES): VITD: 16.68 ng/mL — ABNORMAL LOW (ref 30.00–100.00)

## 2021-07-26 NOTE — Telephone Encounter (Signed)
Called patient back and she states she recently has been getting nauseated when she eats. States it is not every time. She has an appt. With Dr. Silverio Decamp on 08/27/21 and we have not seen her for 7 years. I asked her when she gets nauseated to write down what she has just eaten, to try to figure out her triggers. She agreed.

## 2021-07-26 NOTE — Patient Instructions (Signed)
I will let you know about labwork once I get it all back. I ordered some extra work today, so it may take a little longer. We will determine follow up for fluttering at that point.   Please call rheumatology for follow up. I am hoping that they can help with some of your ongoing inflammation. We can forward our bloodwork to them as well from today.   When you call to set up colonoscopy, let them know about increased acid reflux, some nausea as they may consider an upper scope (egd) as well.

## 2021-07-26 NOTE — Progress Notes (Signed)
Paula Jacobs DOB: 07-25-1961 Encounter date: 07/26/2021  This is a 60 y.o. female who presents with Chief Complaint  Patient presents with   Follow-up    History of present illness:  Concerned with feelings of heart fluttering a lot. Can just be sitting on bed and notes fluttering. Feels easily exerted and out of breath since having COVID. Just hasn't felt like self since having covid. Sometimes comes with a little light headedness. Lasts just a few seconds. Can feel short of breath with fluttering - but subtle. Just feels tired with little activity - like walking through house. Does feel some flutter associated with this. Does note this every day. Sometimes more than once a day.   Still just feeling like she is dealing with post COVID symptoms. Cough eventually got better. Still coughs off and on. Feels like lungs aren't the same. Hx of smoking- quit 13 years ago, smoked over 30 years. Feels out of breath with walking. Not coughing with walking.   Gained a lot of weight after breast cancer treatments. Knows that knees would feel better with weight loss. Has cut out sodas, trying to exercise more, trying to cut back on portions.   Rheumatoid: joints are aching all the time. Has large knot on left thenar eminence. Hurts all the time. Was patient of Dr. Charlestine Night, but he retired. Doesn't remember name of new doc she saw. Hurts in chest as well, worse after radiation. Feet bother her quite a bit; worse after having chemo.   Making appointment for colonoscopy. Taking omeprazole about daily - this helps with heartburn. Tries to avoid triggers. Sometimes feels nauseated with certain foods.   Flu shots have made her feel really sick in the past.   Allergies  Allergen Reactions   Compazine [Prochlorperazine Edisylate] Anxiety   Hydrocodone-Acetaminophen Itching   Latex Itching   Tramadol Hcl Itching and Nausea Only   Current Meds  Medication Sig   acetaminophen (TYLENOL) 500 MG tablet  Take 1,000 mg by mouth every 6 (six) hours as needed for moderate pain.   benazepril-hydrochlorthiazide (LOTENSIN HCT) 10-12.5 MG tablet TAKE 1 TABLET BY MOUTH EVERY DAY   buPROPion (WELLBUTRIN XL) 150 MG 24 hr tablet Take 1 tablet (150 mg total) by mouth daily.   busPIRone (BUSPAR) 15 MG tablet START WITH 1/2 TABLET TWO TIMES DAILY X2 WEEKS THEN INCREASE TO 1 TABLET TWICE DAILY (Patient taking differently: Take 15 mg by mouth daily. START WITH 1/2 TABLET TWO TIMES DAILY X2 WEEKS THEN INCREASE TO 1 TABLET TWICE DAILY)   diphenhydrAMINE (BENADRYL) 25 MG tablet Take 25-50 mg by mouth every 6 (six) hours as needed for allergies.   gabapentin (NEURONTIN) 300 MG capsule Take 1 capsule (300 mg total) by mouth at bedtime.   ibuprofen (ADVIL) 800 MG tablet Take 1 tablet (800 mg total) by mouth every 8 (eight) hours as needed.   letrozole (FEMARA) 2.5 MG tablet TAKE 1 TABLET(2.5 MG) BY MOUTH DAILY   LORazepam (ATIVAN) 0.5 MG tablet Take 1 tablet (0.5 mg total) by mouth once as needed for up to 1 dose for anxiety. Take 1 tab 1 hour before scan and 1 tab 30 mins before scan   SYNTHROID 175 MCG tablet TAKE 1 TABLET (175 MCG TOTAL) BY MOUTH DAILY BEFORE BREAKFAST.   traZODone (DESYREL) 50 MG tablet Take 0.5-1 tablets (25-50 mg total) by mouth at bedtime as needed for sleep.    Review of Systems  Constitutional:  Positive for fatigue. Negative for activity change,  appetite change, chills, fever and unexpected weight change (hard to lose weight).  HENT:  Negative for congestion, ear pain, hearing loss, sinus pressure, sinus pain, sore throat and trouble swallowing.   Eyes:  Negative for pain and visual disturbance.  Respiratory:  Negative for cough, chest tightness, shortness of breath and wheezing.   Cardiovascular:  Positive for palpitations. Negative for chest pain and leg swelling.  Gastrointestinal:  Negative for abdominal pain, blood in stool, constipation, diarrhea, nausea and vomiting.  Genitourinary:   Negative for difficulty urinating and menstrual problem.  Musculoskeletal:  Positive for arthralgias. Negative for back pain.  Skin:  Negative for rash.  Neurological:  Negative for dizziness, weakness, numbness and headaches.  Hematological:  Negative for adenopathy. Does not bruise/bleed easily.  Psychiatric/Behavioral:  Negative for sleep disturbance and suicidal ideas. The patient is not nervous/anxious.    Objective:  BP 118/82 (BP Location: Right Arm, Patient Position: Sitting, Cuff Size: Large)   Pulse 80   Temp 98.1 F (36.7 C) (Oral)   Ht 5\' 4"  (1.626 m)   Wt 211 lb 4.8 oz (95.8 kg)   SpO2 97%   BMI 36.27 kg/m   Weight: 211 lb 4.8 oz (95.8 kg)   BP Readings from Last 3 Encounters:  07/26/21 118/82  05/17/21 (!) 145/89  04/02/20 122/82   Wt Readings from Last 3 Encounters:  07/26/21 211 lb 4.8 oz (95.8 kg)  05/17/21 210 lb 12.8 oz (95.6 kg)  04/02/20 209 lb 8 oz (95 kg)    Physical Exam Constitutional:      General: She is not in acute distress.    Appearance: She is well-developed.  HENT:     Head: Normocephalic and atraumatic.     Right Ear: External ear normal.     Left Ear: External ear normal.     Mouth/Throat:     Pharynx: No oropharyngeal exudate.  Eyes:     Conjunctiva/sclera: Conjunctivae normal.     Pupils: Pupils are equal, round, and reactive to light.  Neck:     Thyroid: No thyromegaly.  Cardiovascular:     Rate and Rhythm: Normal rate and regular rhythm.     Heart sounds: Normal heart sounds. No murmur heard.   No friction rub. No gallop.  Pulmonary:     Effort: Pulmonary effort is normal.     Breath sounds: Normal breath sounds.  Abdominal:     General: Bowel sounds are normal. There is no distension.     Palpations: Abdomen is soft. There is no mass.     Tenderness: There is no abdominal tenderness. There is no guarding.     Hernia: No hernia is present.  Musculoskeletal:        General: No tenderness or deformity. Normal range of  motion.     Cervical back: Normal range of motion and neck supple.     Comments: Bony enlargement first MCP joint left; tenderness. Some bogginess DIP joints bilat hands.   Lymphadenopathy:     Cervical: No cervical adenopathy.  Skin:    General: Skin is warm and dry.     Findings: No rash.  Neurological:     Mental Status: She is alert and oriented to person, place, and time.     Deep Tendon Reflexes: Reflexes normal.     Reflex Scores:      Tricep reflexes are 2+ on the right side and 2+ on the left side.      Bicep reflexes are 2+ on the  right side and 2+ on the left side.      Brachioradialis reflexes are 2+ on the right side and 2+ on the left side.      Patellar reflexes are 2+ on the right side and 2+ on the left side. Psychiatric:        Speech: Speech normal.        Behavior: Behavior normal.        Thought Content: Thought content normal.    Assessment/Plan  1. Palpitations Ekg sinus rhythm. T wave inversions stable from 2 years ago (prior ekg), no acute changes.  - EKG 12-Lead - Magnesium, RBC; Future  2. Primary hypertension Stable. Continue with current medications.  - CBC with Differential/Platelet; Future - Comprehensive metabolic panel; Future  3. Rheumatoid arthritis, involving unspecified site, unspecified whether rheumatoid factor present [facility]) Call rheumatology for follow up.  - Sedimentation rate; Future  4. Hypothyroidism, unspecified type Recheck bloodwork/may need med adjustment.  - TSH; Future - T4, free; Future - T3, free; Future  5. Malignant neoplasm of upper-outer quadrant of left breast in female, estrogen receptor positive ([place]) Follows regularly with oncology. Currently on Femara.  6. Low serum vitamin B12 Recheck levels. Previously low and not supplementing.  - Vitamin B12; Future - Homocysteine; Future - Methylmalonic acid, serum; Future  7. Morbid obesity ([employer]) Would like to work on weight loss. Will check bloodwork first and  determine plan after that.   8. Post-COVID syndrome Increased fatigue, shortness of breath. Ekg stable, had CT chest in august 2022 that was stable with some noted emphysema. Will get bloodwork and consider additional follow up pending that.   9. Need for Tdap vaccination - Tdap vaccine greater than or equal to 7yo IM  10. Left hand pain Follow up with rheum.  11. Anemia, unspecified type - IBC + Ferritin; Future  12. Vitamin D deficiency - VITAMIN D 25 Hydroxy (Vit-D Deficiency, Fractures); Future  13. Lipid screening - Lipid panel; Future  14. Hyperglycemia - Hemoglobin A1c; Future    Return for pending bloodwork.  41 minutes spent in chart review, exam, time with patient, charting   Micheline Rough, MD

## 2021-07-26 NOTE — Telephone Encounter (Signed)
Patient called said every time she eats she gets nauseous and is seeking advise.

## 2021-07-29 LAB — METHYLMALONIC ACID, SERUM: Methylmalonic Acid, Quant: 141 nmol/L (ref 87–318)

## 2021-07-29 LAB — HOMOCYSTEINE: Homocysteine: 13.8 umol/L — ABNORMAL HIGH (ref <10.4)

## 2021-07-29 LAB — MAGNESIUM, RBC: Magnesium RBC: 6.4 mg/dL (ref 4.0–6.4)

## 2021-08-02 ENCOUNTER — Telehealth: Payer: Self-pay

## 2021-08-02 NOTE — Telephone Encounter (Signed)
Patient called asking for results I informed patient results have not been reviewed by PCP and she would receive a call when results have been reviewed

## 2021-08-02 NOTE — Telephone Encounter (Signed)
See results note. 

## 2021-08-03 ENCOUNTER — Telehealth: Payer: Self-pay | Admitting: Family Medicine

## 2021-08-03 DIAGNOSIS — R700 Elevated erythrocyte sedimentation rate: Secondary | ICD-10-CM

## 2021-08-03 MED ORDER — BUPROPION HCL ER (XL) 150 MG PO TB24: 150.0000 mg | ORAL_TABLET | Freq: Every day | ORAL | 1 refills | Status: DC

## 2021-08-03 MED ORDER — SYNTHROID 175 MCG PO TABS: 175.0000 ug | ORAL_TABLET | Freq: Every day | ORAL | 5 refills | Status: DC

## 2021-08-03 MED ORDER — VITAMIN D (ERGOCALCIFEROL) 1.25 MG (50000 UNIT) PO CAPS: 50000.0000 [IU] | ORAL_CAPSULE | ORAL | 0 refills | Status: DC

## 2021-08-03 MED ORDER — BENAZEPRIL-HYDROCHLOROTHIAZIDE 10-12.5 MG PO TABS: 1.0000 | ORAL_TABLET | Freq: Every day | ORAL | 5 refills | Status: DC

## 2021-08-03 NOTE — Addendum Note (Signed)
Addended by: Agnes Lawrence on: 08/03/2021 11:31 AM   Modules accepted: Orders

## 2021-08-03 NOTE — Addendum Note (Signed)
Addended by: Agnes Lawrence on: 08/03/2021 11:12 AM   Modules accepted: Orders

## 2021-08-03 NOTE — Telephone Encounter (Signed)
Pt seen dr Ethlyn Gallery on 07-26-2021 and pt thought she was est with  a rheumatologist and will need a referral to rheum due to joint pain and sed rate is high. Pt has bcbs

## 2021-08-03 NOTE — Telephone Encounter (Signed)
Patient informed-see results note. 

## 2021-08-04 NOTE — Telephone Encounter (Signed)
Spoke with the patient and informed her of the message below.  Patient stated she does not recall having an appt, is aware the referral was placed and someone will contact her with appt info.

## 2021-08-15 ENCOUNTER — Other Ambulatory Visit: Payer: Self-pay | Admitting: Family Medicine

## 2021-08-15 DIAGNOSIS — R002 Palpitations: Secondary | ICD-10-CM

## 2021-08-16 ENCOUNTER — Ambulatory Visit: Payer: BC Managed Care – PPO

## 2021-08-16 DIAGNOSIS — R002 Palpitations: Secondary | ICD-10-CM

## 2021-08-16 NOTE — Progress Notes (Unsigned)
Enrolled for [employer] to mail a ZIO XT long term holter monitor to the patients address on file.  

## 2021-08-27 ENCOUNTER — Ambulatory Visit: Payer: BC Managed Care – PPO | Admitting: Gastroenterology

## 2021-09-14 ENCOUNTER — Telehealth: Payer: Self-pay

## 2021-09-14 NOTE — Telephone Encounter (Signed)
---  Caller states she has a bad cold with a cough and is calling for medication. No temp. Had covid early in the year. Coughing up phlegm , green to clear.  09/13/2021 10:11:56 AM See PCP within 24 Hours [place], RN, Levada Dy  Comments User: Saverio Danker, RN Date/Time Eilene Ghazi Time): 09/13/2021 10:14:43 AM Office closed today for the Holiday, lead PC notified. Caller aware.  09/14/21 1407: pt states she's feeling a lot better. Productive cough noted over the phone. Denies fever, SOB. Pt states symptoms started x 1wk ago. Pt states she is taking Mucinex DM. Pt has HTN; advised to take regular Mucinex moving forward due to HTN. Pt declines visit with provider at this time since symptoms are improving. Pt advised to schedule appt if symptoms persist or return. Pt verb understanding.

## 2021-09-21 ENCOUNTER — Other Ambulatory Visit (INDEPENDENT_AMBULATORY_CARE_PROVIDER_SITE_OTHER): Payer: BC Managed Care – PPO

## 2021-09-21 ENCOUNTER — Ambulatory Visit: Payer: BC Managed Care – PPO | Admitting: Nurse Practitioner

## 2021-09-21 ENCOUNTER — Encounter: Payer: Self-pay | Admitting: Nurse Practitioner

## 2021-09-21 VITALS — BP 142/84 | HR 70 | Ht 64.0 in | Wt 217.0 lb

## 2021-09-21 DIAGNOSIS — Z860100 Personal history of colon polyps, unspecified: Secondary | ICD-10-CM

## 2021-09-21 DIAGNOSIS — D508 Other iron deficiency anemias: Secondary | ICD-10-CM

## 2021-09-21 DIAGNOSIS — R195 Other fecal abnormalities: Secondary | ICD-10-CM

## 2021-09-21 DIAGNOSIS — K219 Gastro-esophageal reflux disease without esophagitis: Secondary | ICD-10-CM

## 2021-09-21 DIAGNOSIS — E538 Deficiency of other specified B group vitamins: Secondary | ICD-10-CM

## 2021-09-21 LAB — VITAMIN B12: Vitamin B-12: 313 pg/mL (ref 211–911)

## 2021-09-21 LAB — FOLATE: Folate: 10.1 ng/mL (ref >5.9)

## 2021-09-21 LAB — FERRITIN: Ferritin: 64 ng/mL (ref 10.0–291.0)

## 2021-09-21 MED ORDER — PLENVU 140 G PO SOLR: ORAL | 0 refills | Status: DC

## 2021-09-21 NOTE — Patient Instructions (Signed)
You have been scheduled for a colonoscopy. Please follow written instructions given to you at your visit today.  Please pick up your prep supplies at the pharmacy within the next 1-3 days. If you use inhalers (even only as needed), please bring them with you on the day of your procedure.   If you are age 59 or older, your body mass index should be between 23-30. Your Body mass index is 37.25 kg/m. If this is out of the aforementioned range listed, please consider follow up with your Primary Care Provider.  If you are age 52 or younger, your body mass index should be between 19-25. Your Body mass index is 37.25 kg/m. If this is out of the aformentioned range listed, please consider follow up with your Primary Care Provider.   ________________________________________________________  The [facility] providers would like to encourage you to use [facility] to communicate with providers for non-urgent requests or questions.  Due to long hold times on the telephone, sending your provider a message by [facility] may be a faster and more efficient way to get a response.  Please allow 48 business hours for a response.  Please remember that this is for non-urgent requests.  _______________________________________________________   Your provider has requested that you go to the basement level for lab work before leaving today. Press "B" on the elevator. The lab is located at the first door on the left as you exit the elevator.   Due to recent changes in healthcare laws, you may see the results of your imaging and laboratory studies on MyChart before your provider has had a chance to review them.  We understand that in some cases there may be results that are confusing or concerning to you. Not all laboratory results come back in the same time frame and the provider may be waiting for multiple results in order to interpret others.  Please give [place] 48 hours in order for your provider to thoroughly review all the results  before contacting the office for clarification of your results.     Follow up low carb diet  I appreciate the  opportunity to care for you  Thank You   [place]

## 2021-09-21 NOTE — Progress Notes (Signed)
ASSESSMENT AND PLAN     # 61 yo female with a history colon polyps ( cecal TVA in 2014, 2 sessile serrated adenomas in 2015). Due for surveillance colonoscopy. Received recall letter from Dr. Silverio Decamp in 2020 --Schedule for a colonoscopy. The risks and benefits of colonoscopy with possible polypectomy / biopsies were discussed and the patient agrees to proceed.   # Obesity --Weight up from 211 ro 217 pounds. She is frustrated, wants to lose weight. Stopped sodas --Recommended low carbohydrate diet --TSH 0.55  # Chronic, stable [place] anemia. Hgb at baseline ( 11.9 ) in November 2019. No menstrual cycle in 10 years. No blood in stool --Check iron studies - Celiac disease not common in [place] but will check tTg, IgA ,   # Chronic loose stool ( not diarrhea), since chemotherapy 2 years ago.  Averages one loose, non-bloody BM a day. Etiology unclear.   IBD unlikely. Microscopic colitis possible though it typically causes diarrhea.   # Chronic GERD. Regurgitation controlled with Omeprazole 1-2 times daily  HISTORY OF PRESENT ILLNESS     Chief Complaint : history of colon polyps  Paula Jacobs is a 61 y.o. female with a past medical history significant for adenomatous colon polyps, GERD, reported remote PUD, left breast cancer in 2019 status post bilateral mastectomies and adjuvant chemotherapy and radiation, hysterectomy, appendectomy , thyroidectomy  / hypothyroidism, HTN, obesity, B12 deficiency. See PMH below for any additional history.   Patient previously followed by Dr. Olevia Perches for history of colon polyps, last colonoscopy was in 2015 ( two [place] ). She has chronic, stable [place] anemia. At last check in early November her hgb was at baseline at 11.9  Stools loose since chemotherapy two year. Not having diarrhea but stools not solid. Hasn't had a solid BM in two years. Having one BM a day.  No blood in stool.   She takes daily Omeprazole 1-2 days for acid regurgitation. Symptoms  controlled on treatment. She called office in November with complains of nausea which has resolved.   She is trying to lose weight She stopped drinking sodas. Trying to reduce portions.    Data Reviewed:  Latest Reference Range & Units 07/26/21 10:18  COMPREHENSIVE METABOLIC PANEL  Rpt  Sodium 135 - 145 mEq/L 138  Potassium 3.5 - 5.1 mEq/L 3.7  Chloride 96 - 112 mEq/L 101  CO2 19 - 32 mEq/L 30  Glucose 70 - 99 mg/dL 92  BUN 6 - 23 mg/dL 21  Creatinine 0.40 - 1.20 mg/dL 0.64  Calcium 8.4 - 10.5 mg/dL 9.4  Alkaline Phosphatase 39 - 117 U/L 63  Albumin 3.5 - 5.2 g/dL 4.2  AST 0 - 37 U/L 15  ALT 0 - 35 U/L 14  Total Protein 6.0 - 8.3 g/dL 7.2  Total Bilirubin 0.2 - 1.2 mg/dL 0.5  GFR >60.00 mL/min 97.20     Latest Reference Range & Units 07/26/21 10:18  WBC 4.0 - 10.5 K/uL 6.7  RBC 3.87 - 5.11 Mil/uL 4.39  Hemoglobin 12.0 - 15.0 g/dL 11.9 (L)  HCT 36.0 - 46.0 % 37.1  MCV 78.0 - 100.0 fl 84.5  MCHC 30.0 - 36.0 g/dL 32.2  RDW 11.5 - 15.5 % 13.8  Platelets 150.0 - 400.0 K/uL 391.0    PREVIOUS GI EVALUATIONS:   Colonoscopy 2015 EGD 3 sessile polyp ranging between 3 to 7 mm in size were found in the cecum and sigmoid colon.  Metal clip embedded in the cecal pouch site  of previous polypectomy from May 2014  Pathology-  Surgical [P], 10 cm colon, polyp - HYPERPLASTIC POLYP(S). NO ADENOMATOUS CHANGE OR MALIGNANCY IDENTIFIED. 2. Surgical [P], cecum, polyp (2) - SESSILE SERRATED ADENOMA ADMIXED WITH COLONIC MUCOSAL FRAGMENTS WITH LYMPHOID AGGREGATE. - NO HIGH GRADE DYSPLASIA OR INVASIVE MALIGNANCY IDENTIFIED   Past Medical History:  Diagnosis Date   ANEMIA-NOS 04/23/2008   Aneurysm ([place]) 10/19/2017   Evaluated by [facility] 10/2017, 79mm, stable   Anginal pain ([place])    went to ED in april 2018 c/o chest pain over last 2 months ; had EKG, CXR  and troponin negative per physician suspected musculoskeletal  ; dc'd with ibuprofen  and recc f/u with outpatient stress test; see care  everywhere ED visit  ; patient denies recurrence of Chest pain since, endorses occ palpitations    Anxiety    Cancer ([place]) 2019   left breast ca- had bil mast   DEPRESSION 04/23/2008   DJD (degenerative joint disease)    Family history of breast cancer    Hyperplastic colon polyp    x2   Hypertension    HYPOTHYROIDISM 10/17/2007   Osteoarthritis    Palpitations    freq at night;  at pre-op states she drinks caffeine and that makes it worse; says the palpitations started after they took her thyroid    PAT 10/27/2009   Qualifier: Diagnosis of  By: [name], MD, [name]     Pneumonia    "in the past, havent had it in years"   PONV (postoperative nausea and vomiting)    only after 1979 surgery   RA (rheumatoid arthritis) ([place])    "problems in feet, hands, and knees"   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/05/2017   SVT (supraventricular tachycardia) ([place])    chronic    Tubulovillous adenoma of colon      Past Surgical History:  Procedure Laterality Date   ABDOMINAL HYSTERECTOMY     APPENDECTOMY     BUNIONECTOMY  10/2009 right & 02/03/10 left   COLONOSCOPY W/ POLYPECTOMY  2015   HAMMER TOE SURGERY     "all toes have pins, done with bunionectomy"   INGUINAL HERNIA REPAIR     KNEE CLOSED REDUCTION Left 12/05/2017   Procedure: CLOSED MANIPULATION LEFT KNEE;  Surgeon: [name], MD;  Location: [facility];  Service: Orthopedics;  Laterality: Left;   MASTECTOMY WITH RADIOACTIVE SEED GUIDED EXCISION AND AXILLARY SENTINEL LYMPH NODE BIOPSY Bilateral 08/21/2018   Procedure: BILATERAL SIMPLE MASTECTOMIES WITH LEFT AXILLARY RADIOACTIVE SEED GUIDED LYMPH NODE EXCISION AND LEFT AXILLARY SENTINEL LYMPH NODE BIOPSY;  Surgeon: Erroll Luna, MD;  Location: [facility];  Service: General;  Laterality: Bilateral;   neck fusion     x3   PORT-A-CATH REMOVAL Right 05/15/2019   Procedure: REMOVAL PORT-A-CATH;  Surgeon: Erroll Luna, MD;  Location: [facility];  Service: General;  Laterality:  Right;   PORTACATH PLACEMENT Right 09/25/2018   Procedure: INSERTION PORT-A-CATH WITH ULTRASOUND ERAS PATHWAY;  Surgeon: Erroll Luna, MD;  Location: [facility];  Service: General;  Laterality: Right;   THYROIDECTOMY  2001   for nodules   TOE AMPUTATION  1979   6th toe removed from each foot   TOTAL KNEE ARTHROPLASTY Left 10/21/2014   Procedure: LEFT TOTAL KNEE ARTHROPLASTY;  Surgeon: [name], MD;  Location: [facility];  Service: Orthopedics;  Laterality: Left;   TOTAL KNEE ARTHROPLASTY Right 12/05/2017   Procedure: RIGHT TOTAL KNEE ARTHROPLASTY;  Surgeon:  [name], MD;  Location: [facility];  Service: Orthopedics;  Laterality: Right;  70 mins   Family History  Problem Relation Age of Onset   Breast cancer Mother 24       metastatic to brain, died at 62   Dementia Father        died at 41   Heart attack Maternal Grandmother    Stroke Maternal Grandmother    Diabetes Maternal Grandmother    Heart disease Maternal Grandmother    Thyroid disease Maternal Grandmother    COPD Maternal Aunt    Cancer Paternal Uncle        details unk   Esophageal cancer Neg Hx    Rectal cancer Neg Hx    Stomach cancer Neg Hx    Social History   Tobacco Use   Smoking status: Former    Packs/day: 0.50    Years: 33.00    Pack years: 16.50    Types: Cigarettes    Quit date: 09/15/2011    Years since quitting: 10.0   Smokeless tobacco: Never  Vaping Use   Vaping Use: Never used  Substance Use Topics   Alcohol use: Yes    Comment: rare   Drug use: No   Current Outpatient Medications  Medication Sig Dispense Refill   acetaminophen (TYLENOL) 500 MG tablet Take 1,000 mg by mouth every 6 (six) hours as needed for moderate pain.     benazepril-hydrochlorthiazide (LOTENSIN HCT) 10-12.5 MG tablet Take 1 tablet by mouth daily. 30 tablet 5   buPROPion (WELLBUTRIN XL) 150 MG 24 hr tablet Take 1 tablet (150 mg total) by mouth daily. 90 tablet 1   busPIRone (BUSPAR) 15 MG tablet START  WITH 1/2 TABLET TWO TIMES DAILY X2 WEEKS THEN INCREASE TO 1 TABLET TWICE DAILY (Patient taking differently: Take 15 mg by mouth daily. START WITH 1/2 TABLET TWO TIMES DAILY X2 WEEKS THEN INCREASE TO 1 TABLET TWICE DAILY) 180 tablet 0   diphenhydrAMINE (BENADRYL) 25 MG tablet Take 25-50 mg by mouth every 6 (six) hours as needed for allergies.     gabapentin (NEURONTIN) 300 MG capsule Take 1 capsule (300 mg total) by mouth at bedtime. 90 capsule 3   ibuprofen (ADVIL) 800 MG tablet Take 1 tablet (800 mg total) by mouth every 8 (eight) hours as needed. 30 tablet 0   letrozole (FEMARA) 2.5 MG tablet TAKE 1 TABLET(2.5 MG) BY MOUTH DAILY 90 tablet 0   LORazepam (ATIVAN) 0.5 MG tablet Take 1 tablet (0.5 mg total) by mouth once as needed for up to 1 dose for anxiety. Take 1 tab 1 hour before scan and 1 tab 30 mins before scan 2 tablet 0   SYNTHROID 175 MCG tablet Take 1 tablet (175 mcg total) by mouth daily before breakfast. 30 tablet 5   traZODone (DESYREL) 50 MG tablet Take 0.5-1 tablets (25-50 mg total) by mouth at bedtime as needed for sleep. 90 tablet 1   Vitamin D, Ergocalciferol, (DRISDOL) 1.25 MG (50000 UNIT) CAPS capsule Take 1 capsule (50,000 Units total) by mouth every 7 (seven) days. 8 capsule 0   No current facility-administered medications for this visit.   Facility-Administered Medications Ordered in Other Visits  Medication Dose Route Frequency Provider Last Rate Last Admin   heparin lock flush 100 unit/mL  500 Units Intracatheter Once PRN Nicholas Lose, MD       sodium chloride flush (NS) 0.9 % injection 10 mL  10 mL Intracatheter PRN Nicholas Lose, MD  Allergies  Allergen Reactions   Compazine [Prochlorperazine Edisylate] Anxiety   Hydrocodone-Acetaminophen Itching   Latex Itching   Tramadol Hcl Itching and Nausea Only     Review of Systems: All other systems reviewed and negative except where noted in HPI.    PHYSICAL EXAM :    Wt Readings from Last 3 Encounters:   07/26/21 211 lb 4.8 oz (95.8 kg)  05/17/21 210 lb 12.8 oz (95.6 kg)  04/02/20 209 lb 8 oz (95 kg)    BP (!) 142/84    Pulse 70    Ht 5\' 4"  (1.626 m)    Wt 217 lb (98.4 kg)    BMI 37.25 kg/m  Constitutional:  Generally well appearing female in no acute distress. Psychiatric: Pleasant. Normal mood and affect. Behavior is normal. EENT: Pupils normal.  Conjunctivae are normal. No scleral icterus. Neck supple.  Cardiovascular: Normal rate, regular rhythm. No edema Pulmonary/chest: Effort normal and breath sounds normal. No wheezing, rales or rhonchi. Abdominal: Soft, nondistended, nontender. Bowel sounds active throughout. There are no masses palpable. No hepatomegaly. Neurological: Alert and oriented to person place and time. Skin: Skin is warm and dry. No rashes noted.  Tye Savoy, NP  09/21/2021, 8:31 AM

## 2021-09-22 LAB — IRON AND TIBC
Iron Saturation: 23 % (ref 15–55)
Iron: 75 ug/dL (ref 27–159)
Total Iron Binding Capacity: 330 ug/dL (ref 250–450)
UIBC: 255 ug/dL (ref 131–425)

## 2021-09-23 ENCOUNTER — Encounter: Payer: Self-pay | Admitting: Gastroenterology

## 2021-09-23 ENCOUNTER — Encounter: Payer: Self-pay | Admitting: Hematology and Oncology

## 2021-09-23 LAB — TISSUE TRANSGLUTAMINASE ABS,IGG,IGA
(tTG) Ab, IgA: <1 U/mL
(tTG) Ab, IgG: <1 U/mL

## 2021-09-23 LAB — IGG: IgG (Immunoglobin G), Serum: 1003 mg/dL (ref 600–1640)

## 2021-09-24 NOTE — Progress Notes (Signed)
Office Visit Note  Patient: Paula Jacobs             Date of Birth: 20-Apr-1961           MRN: [id number]             PCP: Caren Macadam, MD Referring: Caren Macadam, MD Visit Date: 10/07/2021 Occupation: @GUAROCC @  Subjective:  Pain in multiple joints.   History of Present Illness: Paula Jacobs is a 61 y.o. female seen in consultation per request of her PCP for elevated sedimentation rate.  Patient states she has history of arthritis since she was in her 76s.  She started having neck pain several years ago.  She states at 1 point she could not move her neck and was diagnosed with degenerative disease of C-spine.  She underwent C-spine fusion.  Since then she has had total 3 C-spine fusions.  Gradually she started having pain in her knee joints, hands and her feet.  She was under care of an orthopedic surgeon who diagnosed her with degenerative joint disease.  She states she took NSAIDs over the years.  She underwent left total knee replacement in 2016 and right total knee replacement in 2019 she continues to have pain and discomfort in her knee joints and also swelling.  She states few years ago she saw Dr. Despina Hidden for joint pain at that time he did lab work but no diagnosis was made.  He gave her prednisone intermittently and also anti-inflammatories and intermittently for discomfort.  She currently has discomfort in her left shoulder, bilateral hands, bilateral knee joints which are replaced, her ankles and her feet.  She has seen intermittent swelling in her hands, knee joints and her ankles.  She states she had some recent labs which showed elevated sedimentation rate and for that reason she was referred to me.  There is family history of osteoarthritis in her father.  There is no family history of autoimmune disease.  She is gravida 2, para 2, miscarriages 0.  There is no history of DVTs.  There is no history of psoriasis.  She has been experiencing left calf pain for the  last 3 days.  She states she is very much concerned that she may have a DVT.  Activities of Daily Living:  Patient reports morning stiffness for all day. Patient Denies nocturnal pain.  Difficulty dressing/grooming: Denies Difficulty climbing stairs: Reports Difficulty getting out of chair: Reports Difficulty using hands for taps, buttons, cutlery, and/or writing: Denies  Review of Systems  Constitutional:  Positive for fatigue.  HENT:  Negative for mouth sores, mouth dryness and nose dryness.   Eyes:  Negative for pain, itching and dryness.  Respiratory:  Negative for shortness of breath and difficulty breathing.   Cardiovascular:  Negative for chest pain and palpitations.  Gastrointestinal:  Negative for blood in stool, constipation and diarrhea.  Endocrine: Negative for increased urination.  Genitourinary:  Negative for difficulty urinating.  Musculoskeletal:  Positive for joint pain, joint pain, joint swelling, myalgias, morning stiffness, muscle tenderness and myalgias.  Skin:  Negative for color change, rash, redness and sensitivity to sunlight.  Allergic/Immunologic: Negative for susceptible to infections.  Neurological:  Negative for dizziness, numbness, headaches, memory loss and weakness.  Hematological:  Negative for bruising/bleeding tendency and swollen glands.  Psychiatric/Behavioral:  Positive for depressed mood and sleep disturbance. Negative for confusion. The patient is nervous/anxious.    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/05/2017   Aneurysm ([place]) 10/19/2017   Morbid obesity ([place]) 10/22/2014   Rheumatoid arthritis  ([place]) 09/26/2013   Hypertension 01/31/2011   Hypothyroidism 10/17/2007    Past Medical History:  Diagnosis Date   ANEMIA-NOS 04/23/2008   Aneurysm ([place]) 10/19/2017   Evaluated by MRA 10/2017, 20mm, stable   Anginal pain ([place])    went to ED in april 2018 c/o chest pain over last 2 months ; had EKG, CXR  and troponin negative per physician suspected musculoskeletal  ; dc'd with ibuprofen  and recc f/u with outpatient stress test; see care everywhere ED visit  ; patient denies recurrence of Chest pain since, endorses occ palpitations    Anxiety    Cancer ([place]) 2019   left breast ca- had bil mast   DEPRESSION 04/23/2008   DJD (degenerative joint disease)    Family history of breast cancer    Hyperplastic colon polyp    x2   Hypertension    HYPOTHYROIDISM 10/17/2007   Osteoarthritis    Palpitations    freq at night;  at pre-op states she drinks caffeine and that makes it worse; says the palpitations started after they took her thyroid    PAT 10/27/2009   Qualifier: Diagnosis of  By: [name], MD, [name]     Pneumonia    "in the past, havent had it in years"   PONV (postoperative nausea and vomiting)    only after 1979 surgery   RA (rheumatoid arthritis) ([place])    "problems in feet, hands, and knees"   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/05/2017   SVT (supraventricular tachycardia) ([facility])    chronic    Tubulovillous adenoma of colon     Family History  Problem Relation Age of Onset   Breast cancer Mother 26       metastatic to brain, died at 73   Dementia Father        died at 14   Cancer Sister    Schizophrenia Brother    Cancer Brother    COPD Maternal Aunt    Cancer Paternal Uncle        details unk   Heart attack Maternal Grandmother    Stroke Maternal Grandmother    Diabetes Maternal Grandmother    Heart disease Maternal Grandmother    Thyroid disease Maternal Grandmother    Esophageal cancer Neg Hx    Rectal cancer Neg Hx    Stomach cancer Neg Hx    Past Surgical  History:  Procedure Laterality Date   ABDOMINAL HYSTERECTOMY     APPENDECTOMY     BUNIONECTOMY  10/2009 right & 02/03/10 left   COLONOSCOPY W/ POLYPECTOMY  2015   HAMMER TOE SURGERY     "all toes have pins, done with bunionectomy"   INGUINAL HERNIA REPAIR     KNEE CLOSED REDUCTION Left 12/05/2017   Procedure: CLOSED MANIPULATION LEFT KNEE;  Surgeon: [name], MD;  Location: [facility];  Service: Orthopedics;  Laterality: Left;   MASTECTOMY WITH RADIOACTIVE SEED GUIDED EXCISION AND AXILLARY SENTINEL LYMPH NODE BIOPSY Bilateral 08/21/2018   Procedure: BILATERAL SIMPLE MASTECTOMIES WITH LEFT AXILLARY RADIOACTIVE SEED GUIDED LYMPH NODE  EXCISION AND LEFT AXILLARY SENTINEL LYMPH NODE BIOPSY;  Surgeon: Erroll Luna, MD;  Location: [place];  Service: General;  Laterality: Bilateral;   neck fusion     x3   PORT-A-CATH REMOVAL Right 05/15/2019   Procedure: REMOVAL PORT-A-CATH;  Surgeon: Erroll Luna, MD;  Location: [place];  Service: General;  Laterality: Right;   PORTACATH PLACEMENT Right 09/25/2018   Procedure: INSERTION PORT-A-CATH WITH ULTRASOUND ERAS PATHWAY;  Surgeon: Erroll Luna, MD;  Location: [facility];  Service: General;  Laterality: Right;   THYROIDECTOMY  2001   for nodules   TOE AMPUTATION  1979   6th toe removed from each foot   TOTAL KNEE ARTHROPLASTY Left 10/21/2014   Procedure: LEFT TOTAL KNEE ARTHROPLASTY;  Surgeon: [name], MD;  Location: [facility];  Service: Orthopedics;  Laterality: Left;   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 Married - 2006  1 son '92  1 dtr '94. Work - at [employer].   Immunization History  Administered Date(s) Administered   Influenza,inj,Quad PF,6+ Mos 06/02/2015, 10/12/2017    Influenza,inj,quad, With Preservative 10/12/2017   PFIZER(Purple Top)SARS-COV-2 Vaccination 11/21/2019, 12/18/2019   PPD Test 07/23/2015, 08/06/2015   Td 03/10/2010   Tdap 07/26/2021     Objective: Vital Signs: BP (!) 151/102 (BP Location: Right Arm, Patient Position: Sitting, Cuff Size: Large)    Pulse 90    Resp 13    Ht 5' 2.25" (1.581 m)    Wt 218 lb 12.8 oz (99.2 kg)    BMI 39.70 kg/m    Physical Exam Vitals and nursing note reviewed.  Constitutional:      Appearance: She is well-developed.  HENT:     Head: Normocephalic and atraumatic.  Eyes:     Conjunctiva/sclera: Conjunctivae normal.  Cardiovascular:     Rate and Rhythm: Normal rate and regular rhythm.     Heart sounds: Normal heart sounds.  Pulmonary:     Effort: Pulmonary effort is normal.     Breath sounds: Normal breath sounds.  Abdominal:     General: Bowel sounds are normal.     Palpations: Abdomen is soft.  Musculoskeletal:     Cervical back: Normal range of motion.  Lymphadenopathy:     Cervical: No cervical adenopathy.  Skin:    General: Skin is warm and dry.     Capillary Refill: Capillary refill takes less than 2 seconds.  Neurological:     Mental Status: She is alert and oriented to person, place, and time.  Psychiatric:        Behavior: Behavior normal.     Musculoskeletal Exam: She had limited range of motion of the cervical spine without discomfort.  She had good benefit of motion of bilateral shoulders with some discomfort range of motion of left shoulder joint.  Elbow joints and wrist joints with good range of motion.  She had prominence of left CMC joint.  PIP and DIP thickening was noted.  No synovitis was noted.  Hip joints with good range of motion.  Bilateral knee joints were replaced without any warmth swelling or effusion.  She had tenderness over the left calf.  Ankles were in good range of motion without any warmth or swelling.  She had bunionectomy scar  over bilateral great toe.  No  synovitis or tenderness was noted.  CDAI Exam: CDAI Score: -- Patient Global: --; Provider Global: -- Swollen: --; Tender: -- Joint Exam 10/07/2021   No joint exam has been documented for this visit   There is currently no information documented on the homunculus. Go to the Rheumatology activity and complete the homunculus joint exam.  Investigation: No additional findings.  Imaging: No results found.  Recent Labs: Lab Results  Component Value Date   WBC 6.7 07/26/2021   HGB 11.9 (L) 07/26/2021   PLT 391.0 07/26/2021   NA 138 07/26/2021   K 3.7 07/26/2021   CL 101 07/26/2021   CO2 30 07/26/2021   GLUCOSE 92 07/26/2021   BUN 21 07/26/2021   CREATININE 0.64 07/26/2021   BILITOT 0.5 07/26/2021   ALKPHOS 63 07/26/2021   AST 15 07/26/2021   ALT 14 07/26/2021   PROT 7.2 07/26/2021   ALBUMIN 4.2 07/26/2021   CALCIUM 9.4 07/26/2021   GFRAA >60 05/09/2019    Speciality Comments: No specialty comments available.  Procedures:  No procedures performed Allergies: Compazine [prochlorperazine edisylate], Hydrocodone-acetaminophen, Latex, and Tramadol hcl   Assessment / Plan:     Visit Diagnoses: Pain in both hands -she complains of pain and discomfort in her hands since she was in her 73s.  The symptoms have been gradually getting worse.  She is taking NSAIDs over the years.  No synovitis was noted.  Her main concern was left CMC joint which appears to be osteoarthritic.  Plan: XR Hand 2 View Right, XR Hand 2 View Left.  X-rays were consistent with osteoarthritis.  Status post total bilateral knee replacement - Left total knee replacement 2016, right total knee replacement 2019 by Dr. Alvan Dame.  She continues to have pain and discomfort in the bilateral knee joints.  No warmth swelling or effusion was noted.  Pain of left calf-she has been experiencing left calf pain for the last 3 days and is concerned about a DVT.  I will schedule ultrasound to rule out DVT.  Pain in both feet  -she complains of pain in her bilateral feet.  She had bilateral bunionectomies.  No synovitis was noted.  She had good range of motion in her ankles and her MTPs.  Plan: XR Foot 2 Views Right, XR Foot 2 Views Left.  X-ray showed bilateral first MTP postsurgical changes and osteoarthritis.  DDD (degenerative disc disease), cervical - S/p fusion x3.  She had limited range of motion of her cervical spine without any radiculopathy.  Elevated sed rate - 08/05/21: ESR 48, Mg 6.4, TSH 0.55, T4 1.04, T3 2.9, Vitamin B12 237, vitamin D 16.68, homocysteine 13.8, methylmalonic acid 141, Iron 74, Transferrin 288.  She had no synovitis on examination.  I will repeat sed rate today.  I will also obtain SPEP.  Primary hypertension -her blood pressure was elevated at 151/102.  She is on Benzepril-HCTZ.  I advised her to monitor blood pressure closely and follow-up with the PCP.  Brain aneurysm-patient states she sees a neurologist for the evaluation of brain aneurysm.  Malignant neoplasm of upper-outer quadrant of left breast in female, estrogen receptor positive ([place]) - 2019,bilateral mastectomy, CTX and RTX. She is on Letrozole. She is followed by Dr. Lindi Adie.  Primary insomnia -she has history of chronic insomnia and she is  on Trazadone  Anxiety and depression -controlled on on Loraepam, Wellbutrin XL 150 mg and buspirone 15 mg  Vitamin D deficiency-she takes vitamin D.  History of hypothyroidism - s/p thyroidectomy  Morbid obesity ([place])  Orders: Orders Placed This Encounter  Procedures   XR Hand 2 View Right   XR Hand 2 View Left   XR Foot 2 Views Right   XR Foot 2 Views Left   Sedimentation rate   Rheumatoid factor   Cyclic citrul peptide antibody, IgG   Serum protein electrophoresis with reflex   No orders of the defined types were placed in this encounter.    Follow-Up Instructions: Return for Pain in multiple joints.   Bo Merino, MD  Note - This record has been created  using Editor, commissioning.  Chart creation errors have been sought, but may not always  have been located. Such creation errors do not reflect on  the standard of medical care.

## 2021-09-29 ENCOUNTER — Encounter: Payer: BC Managed Care – PPO | Admitting: Gastroenterology

## 2021-10-06 NOTE — Progress Notes (Signed)
Reviewed and agree with documentation and assessment and plan. K. Veena Chicquita Mendel , MD   

## 2021-10-07 ENCOUNTER — Inpatient Hospital Stay (HOSPITAL_COMMUNITY): Admission: RE | Admit: 2021-10-07 | Payer: BC Managed Care – PPO | Source: Ambulatory Visit

## 2021-10-07 ENCOUNTER — Other Ambulatory Visit: Payer: Self-pay

## 2021-10-07 ENCOUNTER — Ambulatory Visit: Payer: Self-pay

## 2021-10-07 ENCOUNTER — Ambulatory Visit (INDEPENDENT_AMBULATORY_CARE_PROVIDER_SITE_OTHER): Payer: BC Managed Care – PPO | Admitting: Rheumatology

## 2021-10-07 ENCOUNTER — Encounter: Payer: Self-pay | Admitting: Rheumatology

## 2021-10-07 VITALS — BP 151/102 | HR 90 | Resp 13 | Ht 62.25 in | Wt 218.8 lb

## 2021-10-07 DIAGNOSIS — Z170 Estrogen receptor positive status [ER+]: Secondary | ICD-10-CM

## 2021-10-07 DIAGNOSIS — F5101 Primary insomnia: Secondary | ICD-10-CM

## 2021-10-07 DIAGNOSIS — M79671 Pain in right foot: Secondary | ICD-10-CM | POA: Diagnosis not present

## 2021-10-07 DIAGNOSIS — M5030 Other cervical disc degeneration, unspecified cervical region: Secondary | ICD-10-CM

## 2021-10-07 DIAGNOSIS — Z8639 Personal history of other endocrine, nutritional and metabolic disease: Secondary | ICD-10-CM

## 2021-10-07 DIAGNOSIS — I729 Aneurysm of unspecified site: Secondary | ICD-10-CM

## 2021-10-07 DIAGNOSIS — Z95828 Presence of other vascular implants and grafts: Secondary | ICD-10-CM

## 2021-10-07 DIAGNOSIS — F32A Depression, unspecified: Secondary | ICD-10-CM

## 2021-10-07 DIAGNOSIS — M79672 Pain in left foot: Secondary | ICD-10-CM | POA: Diagnosis not present

## 2021-10-07 DIAGNOSIS — I671 Cerebral aneurysm, nonruptured: Secondary | ICD-10-CM

## 2021-10-07 DIAGNOSIS — F419 Anxiety disorder, unspecified: Secondary | ICD-10-CM

## 2021-10-07 DIAGNOSIS — M79642 Pain in left hand: Secondary | ICD-10-CM | POA: Diagnosis not present

## 2021-10-07 DIAGNOSIS — M79662 Pain in left lower leg: Secondary | ICD-10-CM | POA: Diagnosis not present

## 2021-10-07 DIAGNOSIS — E559 Vitamin D deficiency, unspecified: Secondary | ICD-10-CM

## 2021-10-07 DIAGNOSIS — Z96653 Presence of artificial knee joint, bilateral: Secondary | ICD-10-CM | POA: Diagnosis not present

## 2021-10-07 DIAGNOSIS — Z96651 Presence of right artificial knee joint: Secondary | ICD-10-CM

## 2021-10-07 DIAGNOSIS — C50412 Malignant neoplasm of upper-outer quadrant of left female breast: Secondary | ICD-10-CM

## 2021-10-07 DIAGNOSIS — M79641 Pain in right hand: Secondary | ICD-10-CM | POA: Diagnosis not present

## 2021-10-07 DIAGNOSIS — R700 Elevated erythrocyte sedimentation rate: Secondary | ICD-10-CM

## 2021-10-07 DIAGNOSIS — Z803 Family history of malignant neoplasm of breast: Secondary | ICD-10-CM

## 2021-10-07 DIAGNOSIS — M069 Rheumatoid arthritis, unspecified: Secondary | ICD-10-CM

## 2021-10-07 DIAGNOSIS — I10 Essential (primary) hypertension: Secondary | ICD-10-CM

## 2021-10-07 NOTE — Patient Instructions (Signed)
[facility]  Address: [address] [place], [place] [address]  Phone: [phone]  Appointment: 10/07/2021 3:00pm

## 2021-10-11 ENCOUNTER — Other Ambulatory Visit: Payer: BC Managed Care – PPO

## 2021-10-12 ENCOUNTER — Encounter (HOSPITAL_COMMUNITY): Payer: BC Managed Care – PPO

## 2021-10-12 LAB — PROTEIN ELECTROPHORESIS, SERUM, WITH REFLEX
Albumin ELP: 3.8 g/dL (ref 3.8–4.8)
Alpha 1: 0.3 g/dL (ref 0.2–0.3)
Alpha 2: 0.8 g/dL (ref 0.5–0.9)
Beta 2: 0.4 g/dL (ref 0.2–0.5)
Beta Globulin: 0.4 g/dL (ref 0.4–0.6)
Gamma Globulin: 0.8 g/dL (ref 0.8–1.7)
Total Protein: 6.6 g/dL (ref 6.1–8.1)

## 2021-10-12 LAB — CYCLIC CITRUL PEPTIDE ANTIBODY, IGG: Cyclic Citrullin Peptide Ab: <16 UNITS

## 2021-10-12 LAB — SEDIMENTATION RATE: Sed Rate: 28 mm/h (ref 0–30)

## 2021-10-12 LAB — RHEUMATOID FACTOR: Rheumatoid fact SerPl-aCnc: <14 IU/mL (ref <14)

## 2021-10-12 NOTE — Progress Notes (Signed)
Sed rate, rheumatoid factor, anti-CCP and SPEP were normal.  We will discuss results at the follow-up visit.

## 2021-10-14 ENCOUNTER — Encounter (HOSPITAL_COMMUNITY): Payer: BC Managed Care – PPO

## 2021-10-15 ENCOUNTER — Other Ambulatory Visit: Payer: BC Managed Care – PPO

## 2021-10-18 ENCOUNTER — Encounter (HOSPITAL_COMMUNITY): Payer: BC Managed Care – PPO

## 2021-10-19 NOTE — Progress Notes (Signed)
Office Visit Note  Patient: Paula Jacobs             Date of Birth: 09/12/1961           MRN: [id number]             PCP: Caren Macadam, MD Referring: Caren Macadam, MD Visit Date: 10/28/2021 Occupation: _0 @  Subjective:  Pain in multiple joints  History of Present Illness: Paula Jacobs is a 61 y.o. female with history of pain in multiple joints.  She states she continues to have pain and stiffness in almost all of her joints.  She continues to have pain and discomfort in her bilateral hands and her bilateral feet.  She has not noticed any joint swelling.  She has a stiffness in her knee joints which are replaced.  Activities of Daily Living:  Patient reports morning stiffness for 30-60 minutes.   Patient Denies nocturnal pain.  Difficulty dressing/grooming: Denies Difficulty climbing stairs: Reports Difficulty getting out of chair: Denies Difficulty using hands for taps, buttons, cutlery, and/or writing: Denies  Review of Systems  Constitutional:  Negative for fatigue.  HENT:  Negative for mouth sores, mouth dryness and nose dryness.   Eyes:  Positive for itching. Negative for pain and dryness.  Respiratory:  Negative for shortness of breath and difficulty breathing.   Cardiovascular:  Negative for chest pain and palpitations.  Gastrointestinal:  Negative for blood in stool, constipation and diarrhea.  Endocrine: Negative for increased urination.  Genitourinary:  Negative for difficulty urinating.  Musculoskeletal:  Positive for joint pain, joint pain, joint swelling, myalgias, morning stiffness, muscle tenderness and myalgias.  Skin:  Negative for color change, rash and redness.  Allergic/Immunologic: Negative for susceptible to infections.  Neurological:  Negative for dizziness, numbness, headaches, memory loss and weakness.  Hematological:  Negative for bruising/bleeding tendency.  Psychiatric/Behavioral:  Negative for confusion.    PMFS  History:  Patient Active Problem List   Diagnosis Date Noted   Port-A-Cath in place 09/26/2018   Breast cancer, left ([place]) 08/22/2018   Breast cancer, stage 2, left ([place]) 08/21/2018   Genetic testing 08/08/2018   Family history of breast cancer    Malignant neoplasm of upper-outer quadrant of left breast in female, estrogen receptor positive ([place]) 07/19/2018   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/05/2017   Aneurysm ([place]) 10/19/2017   Morbid obesity ([place]) 10/22/2014   Rheumatoid arthritis ([place]) 09/26/2013   Hypertension 01/31/2011   Hypothyroidism 10/17/2007    Past Medical History:  Diagnosis Date   ANEMIA-NOS 04/23/2008   Aneurysm ([place]) 10/19/2017   Evaluated by MRA 10/2017, 47m, stable   [place]inal [place]    went to ED in april 2018 c/o chest pain over last 2 months ; had EKG, CXR  and troponin negative per physician suspected musculoskeletal  ; dc'd with ibuprofen  and recc f/u with outpatient stress test; see care everywhere ED visit  ; patient denies recurrence of Chest pain since, endorses occ palpitations    Anxiety    [place] ([facility]   left breast ca- had bil mast   DEPRESSION 04/23/2008   DJD (degenerative joint disease)    Family history of breast cancer    Hyperplastic colon polyp    x2   Hypertension    HYPOTHYROIDISM 10/17/2007   Osteoarthritis    Palpitations    freq at night;  at pre-op states she drinks caffeine and that makes it worse; says the palpitations started after they took her  thyroid    PAT 10/27/2009   Qualifier: Diagnosis of  By: [name], MD, [name]     Pneumonia    "in the past, havent had it in years"   PONV (postoperative nausea and vomiting)    only after 1979 surgery   RA (rheumatoid arthritis) ([place])    "problems in feet, hands, and knees"   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/05/2017   SVT (supraventricular tachycardia) (HCC)    chronic    Tubulovillous adenoma of colon     Family History  Problem Relation Age of Onset   Breast cancer Mother 64        metastatic to brain, died at 61   Dementia Father        died at 21   Cancer Sister    Schizophrenia Brother    Cancer Brother    COPD Maternal Aunt    Cancer Paternal Uncle        details unk   Heart attack Maternal Grandmother    Stroke Maternal Grandmother    Diabetes Maternal Grandmother    Heart disease Maternal Grandmother    Thyroid disease Maternal Grandmother    Esophageal cancer Neg Hx    Rectal cancer Neg Hx    Stomach cancer Neg Hx    Past Surgical History:  Procedure Laterality Date   ABDOMINAL HYSTERECTOMY     APPENDECTOMY     BUNIONECTOMY  10/2009 right & 02/03/10 left   COLONOSCOPY  10/22/2021   with biopsy   COLONOSCOPY W/ POLYPECTOMY  09/19/2013   HAMMER TOE SURGERY     "all toes have pins, done with bunionectomy"   INGUINAL HERNIA REPAIR     KNEE CLOSED REDUCTION Left 12/05/2017   Procedure: CLOSED MANIPULATION LEFT KNEE;  Surgeon: [name], MD;  Location: [facility];  Service: Orthopedics;  Laterality: Left;   MASTECTOMY WITH RADIOACTIVE SEED GUIDED EXCISION AND AXILLARY SENTINEL LYMPH NODE BIOPSY Bilateral 08/21/2018   Procedure: BILATERAL SIMPLE MASTECTOMIES WITH LEFT AXILLARY RADIOACTIVE SEED GUIDED LYMPH NODE EXCISION AND LEFT AXILLARY SENTINEL LYMPH NODE BIOPSY;  Surgeon: Erroll Luna, MD;  Location: [facility];  Service: General;  Laterality: Bilateral;   neck fusion     x3   PORT-A-CATH REMOVAL Right 05/15/2019   Procedure: REMOVAL PORT-A-CATH;  Surgeon: Erroll Luna, MD;  Location: [facility];  Service: General;  Laterality: Right;   PORTACATH PLACEMENT Right 09/25/2018   Procedure: INSERTION PORT-A-CATH WITH ULTRASOUND ERAS PATHWAY;  Surgeon: Erroll Luna, MD;  Location: [facility];  Service: General;  Laterality: Right;   THYROIDECTOMY  09/20/1999   for nodules   TOE AMPUTATION  09/19/1977   6th toe removed from each foot   TOTAL KNEE ARTHROPLASTY Left 10/21/2014   Procedure: LEFT TOTAL KNEE ARTHROPLASTY;   Surgeon: [name], MD;  Location: [facility];  Service: Orthopedics;  Laterality: Left;   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 Married - 2006  1 son '92  1 dtr '94. Work - at [employer].   Immunization History  Administered Date(s) Administered   Influenza,inj,Quad PF,6+ Mos 06/02/2015, 10/12/2017   Influenza,inj,quad, With Preservative 10/12/2017   PFIZER(Purple Top)SARS-COV-2 Vaccination 11/21/2019, 12/18/2019   PPD Test 07/23/2015, 08/06/2015   Td 03/10/2010  Tdap 07/26/2021     Objective: Vital Signs: BP (!) 145/88 (BP Location: Right Arm, Patient Position: Sitting, Cuff Size: Large)    Pulse 91    Ht _0  (1.575 m)    Wt 221 lb (100.2 kg)    BMI 40.42 kg/m    Physical Exam Vitals and nursing note reviewed.  Constitutional:      Appearance: She is well-developed.  HENT:     Head: Normocephalic and atraumatic.  Eyes:     Conjunctiva/sclera: Conjunctivae normal.  Cardiovascular:     Rate and Rhythm: Normal rate and regular rhythm.     Heart sounds: Normal heart sounds.  Pulmonary:     Effort: Pulmonary effort is normal.     Breath sounds: Normal breath sounds.  Abdominal:     General: Bowel sounds are normal.     Palpations: Abdomen is soft.  Musculoskeletal:     Cervical back: Normal range of motion.  Lymphadenopathy:     Cervical: No cervical adenopathy.  Skin:    General: Skin is warm and dry.     Capillary Refill: Capillary refill takes less than 2 seconds.  Neurological:     Mental Status: She is alert and oriented to person, place, and time.  Psychiatric:        Behavior: Behavior normal.     Musculoskeletal Exam: C-spine was in limited range of motion.  She had good mobility in her lumbar spine.  Shoulder joints, elbow joints,  wrist joints with good range of motion.  She had bilateral PIP and DIP thickening.  Hip joints and knee joints in good range of motion.  Her knee joints are replaced.  She had no tenderness over ankles or MTPs.  CDAI Exam: CDAI Score: -- Patient Global: --; Provider Global: -- Swollen: --; Tender: -- Joint Exam 10/28/2021   No joint exam has been documented for this visit   There is currently no information documented on the homunculus. Go to the Rheumatology activity and complete the homunculus joint exam.  Investigation: No additional findings.  Imaging: XR Foot 2 Views Left  Result Date: 10/07/2021 Postsurgical change was noted in the first metatarsal.  Hardware was noted in the first proximal phalanx.  PIP and DIP narrowing was noted.  No intertarsal, tibiotalar or subtalar joint space narrowing was noted. Impression: These findings are consistent with osteoarthritis of the foot.  XR Foot 2 Views Right  Result Date: 10/07/2021 Postsurgical change was noted in the first metatarsal.  Hardware was noted in the first proximal phalanx.  PIP and DIP narrowing was noted.  No intertarsal, tibiotalar or subtalar joint space narrowing was noted. Impression: These findings are consistent with osteoarthritis of the foot.  XR Hand 2 View Left  Result Date: 10/07/2021 CMC, PIP and DIP narrowing was noted.  No MCP, intercarpal or radiocarpal joint space narrowing was noted.  No erosive changes were noted. Impression: These findings are consistent with osteoarthritis of the hand.  XR Hand 2 View Right  Result Date: 10/07/2021 CMC, PIP and DIP narrowing was noted.  No MCP, intercarpal or radiocarpal joint space narrowing was noted.  No erosive changes were noted. Impression: These findings are consistent with osteoarthritis of the hand.   Recent Labs: Lab Results  Component Value Date   WBC 6.7 07/26/2021   HGB 11.9 (L) 07/26/2021   PLT 391.0 07/26/2021   NA 138 07/26/2021   K 3.7  07/26/2021   CL 101 07/26/2021   CO2 30 07/26/2021  GLUCOSE 92 07/26/2021   BUN 21 07/26/2021   CREATININE 0.64 07/26/2021   BILITOT 0.5 07/26/2021   ALKPHOS 63 07/26/2021   AST 15 07/26/2021   ALT 14 07/26/2021   PROT 6.6 10/07/2021   ALBUMIN 4.2 07/26/2021   CALCIUM 9.4 07/26/2021   GFRAA >60 05/09/2019   October 07, 2021 SPEP normal, ESR 28, RF negative, anti-CCP negative   Speciality Comments: No specialty comments available.  Procedures:  No procedures performed Allergies: Compazine [prochlorperazine edisylate], Hydrocodone-acetaminophen, Latex, and Tramadol hcl   Assessment / Plan:     Visit Diagnoses: Primary osteoarthritis of both hands - Clinical and radiographic findings were consistent with osteoarthritis.  Detailed counsel regarding osteoarthritis was provided.  X-ray findings and lab findings were discussed with the patient.  Joint protection muscle strengthening was discussed.  Exercises were demonstrated and a handout was given.  Status post total bilateral knee replacement - Left total knee replacement 2016 and right total knee replacement 2019 by Dr. Alvan Dame.  She gives history of chronic discomfort.  No synovitis was noted.  Lower extremity muscle strengthening exercises were discussed.  Weight loss diet and exercise was encouraged.  I advised her to join water aerobics.  Natural anti-inflammatories were discussed.  Primary osteoarthritis of both feet - Status post bilateral bunionectomies.  Clinical and radiographic findings were consistent with osteoarthritis.  X-ray findings were discussed with the patient.  Proper fitting shoes and feet exercises were discussed.  DDD (degenerative disc disease), cervical - S/p fusion x3.  She had limited range of motion for cervical spine.  She denies any radiculopathy.  Elevated sed rate - Repeat sed rate was normal.  All autoimmune work-up was negative.  Primary hypertension-blood pressure is a still elevated.  Monitoring  blood pressure was advised.  Other medical problems are listed as follows:  Brain aneurysm  Malignant neoplasm of upper-outer quadrant of left breast in female, estrogen receptor positive ([place])  Anxiety and depression  Primary insomnia  History of hypothyroidism  Vitamin D deficiency  Morbid obesity (HCC)-diet and exercise was discussed at length.  Orders: No orders of the defined types were placed in this encounter.  No orders of the defined types were placed in this encounter.    Follow-Up Instructions: Return in about 6 months (around 04/27/2022) for Osteoarthritis.   Bo Merino, MD  Note - This record has been created using Editor, commissioning.  Chart creation errors have been sought, but may not always  have been located. Such creation errors do not reflect on  the standard of medical care.

## 2021-10-22 ENCOUNTER — Ambulatory Visit (AMBULATORY_SURGERY_CENTER): Payer: BC Managed Care – PPO | Admitting: Gastroenterology

## 2021-10-22 ENCOUNTER — Other Ambulatory Visit: Payer: Self-pay

## 2021-10-22 ENCOUNTER — Encounter: Payer: Self-pay | Admitting: Gastroenterology

## 2021-10-22 VITALS — BP 161/72 | HR 85 | Temp 96.6°F | Resp 13 | Ht 64.0 in | Wt 217.0 lb

## 2021-10-22 DIAGNOSIS — Z860100 Personal history of colon polyps, unspecified: Secondary | ICD-10-CM

## 2021-10-22 DIAGNOSIS — Z1211 Encounter for screening for malignant neoplasm of colon: Secondary | ICD-10-CM | POA: Diagnosis not present

## 2021-10-22 DIAGNOSIS — D120 Benign neoplasm of cecum: Secondary | ICD-10-CM

## 2021-10-22 HISTORY — PX: COLONOSCOPY: SHX174

## 2021-10-22 MED ORDER — SODIUM CHLORIDE 0.9 % IV SOLN: 500.0000 mL | Freq: Once | INTRAVENOUS | Status: DC

## 2021-10-22 NOTE — Op Note (Signed)
[facility] Patient Name: Paula Jacobs Procedure Date: 10/22/2021 3:10 PM MRN: [id number] Endoscopist: [name] , MD Age: 61 Referring MD:  Date of Birth: 1961/08/07 Gender: Female Account #: [id number] Procedure:                Colonoscopy Indications:              Screening for colorectal malignant neoplasm Medicines:                Monitored Anesthesia Care Procedure:                Pre-Anesthesia Assessment:                           - Prior to the procedure, a History and Physical                            was performed, and patient medications and                            allergies were reviewed. The patient's tolerance of                            previous anesthesia was also reviewed. The risks                            and benefits of the procedure and the sedation                            options and risks were discussed with the patient.                            All questions were answered, and informed consent                            was obtained. Prior Anticoagulants: The patient has                            taken no previous anticoagulant or antiplatelet                            agents. ASA Grade Assessment: III - A patient with                            severe systemic disease. After reviewing the risks                            and benefits, the patient was deemed in                            satisfactory condition to undergo the procedure.                           After obtaining informed consent, the colonoscope  was passed under direct vision. Throughout the                            procedure, the patient's blood pressure, pulse, and                            oxygen saturations were monitored continuously. The                            PCF-HQ190L Colonoscope was introduced through the                            anus and advanced to the the cecum, identified by                             appendiceal orifice and ileocecal valve. The                            colonoscopy was performed without difficulty. The                            patient tolerated the procedure well. The quality                            of the bowel preparation was excellent. The                            ileocecal valve, appendiceal orifice, and rectum                            were photographed. Scope In: 3:25:38 PM Scope Out: 3:42:54 PM Scope Withdrawal Time: 0 hours 10 minutes 52 seconds  Total Procedure Duration: 0 hours 17 minutes 16 seconds  Findings:                 The perianal and digital rectal examinations were                            normal.                           A 3 mm polyp was found in the appendiceal orifice.                            The polyp was sessile. The polyp was removed with a                            cold snare. Resection and retrieval were complete.                           Scattered small-mouthed diverticula were found in                            the sigmoid colon, descending colon, transverse  colon and ascending colon.                           Non-bleeding external and internal hemorrhoids were                            found during retroflexion. The hemorrhoids were                            medium-sized. Complications:            No immediate complications. Estimated Blood Loss:     Estimated blood loss was minimal. Impression:               - One 3 mm polyp at the appendiceal orifice,                            removed with a cold snare. Resected and retrieved.                           - Diverticulosis in the sigmoid colon, in the                            descending colon, in the transverse colon and in                            the ascending colon.                           - Non-bleeding external and internal hemorrhoids. Recommendation:           - Patient has a contact number available for                             emergencies. The signs and symptoms of potential                            delayed complications were discussed with the                            patient. Return to normal activities tomorrow.                            Written discharge instructions were provided to the                            patient.                           - Resume previous diet.                           - Continue present medications.                           - Await pathology results.                           -  Repeat colonoscopy in 5-10 years for surveillance                            based on pathology results. [name], MD 10/22/2021 3:47:15 PM This report has been signed electronically.

## 2021-10-22 NOTE — Patient Instructions (Signed)
Handout on diverticulosis, polyps, and hemorrhoids provided   Await for pathology results.  YOU HAD AN ENDOSCOPIC PROCEDURE TODAY AT [facility]:   Refer to the procedure report that was given to you for any specific questions about what was found during the examination.  If the procedure report does not answer your questions, please call your gastroenterologist to clarify.  If you requested that your care partner not be given the details of your procedure findings, then the procedure report has been included in a sealed envelope for you to review at your convenience later.  YOU SHOULD EXPECT: Some feelings of bloating in the abdomen. Passage of more gas than usual.  Walking can help get rid of the air that was put into your GI tract during the procedure and reduce the bloating. If you had a lower endoscopy (such as a colonoscopy or flexible sigmoidoscopy) you may notice spotting of blood in your stool or on the toilet paper. If you underwent a bowel prep for your procedure, you may not have a normal bowel movement for a few days.  Please Note:  You might notice some irritation and congestion in your nose or some drainage.  This is from the oxygen used during your procedure.  There is no need for concern and it should clear up in a day or so.  SYMPTOMS TO REPORT IMMEDIATELY:  Following lower endoscopy (colonoscopy or flexible sigmoidoscopy):  Excessive amounts of blood in the stool  Significant tenderness or worsening of abdominal pains  Swelling of the abdomen that is new, acute  Fever of 100F or higher  For urgent or emergent issues, a gastroenterologist can be reached at any hour by calling [phone]. Do not use MyChart messaging for urgent concerns.    DIET:  We do recommend a small meal at first, but then you may proceed to your regular diet.  Drink plenty of fluids but you should avoid alcoholic beverages for 24 hours.  ACTIVITY:  You should plan to take it easy  for the rest of today and you should NOT DRIVE or use heavy machinery until tomorrow (because of the sedation medicines used during the test).    FOLLOW UP: Our staff will call the number listed on your records 48-72 hours following your procedure to check on you and address any questions or concerns that you may have regarding the information given to you following your procedure. If we do not reach you, we will leave a message.  We will attempt to reach you two times.  During this call, we will ask if you have developed any symptoms of COVID 19. If you develop any symptoms (ie: fever, flu-like symptoms, shortness of breath, cough etc.) before then, please call [phone].  If you test positive for Covid 19 in the 2 weeks post procedure, please call and report this information to [place].    If any biopsies were taken you will be contacted by phone or by letter within the next 1-3 weeks.  Please call us at [phone] if you have not heard about the biopsies in 3 weeks.    SIGNATURES/CONFIDENTIALITY: You and/or your care partner have signed paperwork which will be entered into your electronic medical record.  These signatures attest to the fact that that the information above on your After Visit Summary has been reviewed and is understood.  Full responsibility of the confidentiality of this discharge information lies with you and/or your care-partner.

## 2021-10-22 NOTE — Progress Notes (Signed)
To pacu, VSS. Report to [email or web address] 

## 2021-10-22 NOTE — Progress Notes (Signed)
[facility] History and Physical   Primary Care Physician:  Caren Macadam, MD   Reason for Procedure:  History of adenomatous colon polyps  Plan:    Surveillance colonoscopy with possible interventions as needed     HPI: Paula Jacobs is a very pleasant 61 y.o. female here for surveillance colonoscopy. Denies any nausea, vomiting, abdominal pain, melena or bright red blood per rectum  The risks and benefits as well as alternatives of endoscopic procedure(s) have been discussed and reviewed. All questions answered. The patient agrees to proceed.    Past Medical History:  Diagnosis Date   ANEMIA-NOS 04/23/2008   Aneurysm ([place]) 10/19/2017   Evaluated by [facility] 10/2017, 41mm, stable   Anginal pain ([place])    went to ED in april 2018 c/o chest pain over last 2 months ; had EKG, CXR  and troponin negative per physician suspected musculoskeletal  ; dc'd with ibuprofen  and recc f/u with outpatient stress test; see care everywhere ED visit  ; patient denies recurrence of Chest pain since, endorses occ palpitations    Anxiety    Cancer ([place]) 2019   left breast ca- had bil mast   DEPRESSION 04/23/2008   DJD (degenerative joint disease)    Family history of breast cancer    Hyperplastic colon polyp    x2   Hypertension    HYPOTHYROIDISM 10/17/2007   Osteoarthritis    Palpitations    freq at night;  at pre-op states she drinks caffeine and that makes it worse; says the palpitations started after they took her thyroid    PAT 10/27/2009   Qualifier: Diagnosis of  By: [name], MD, [name]     Pneumonia    "in the past, havent had it in years"   PONV (postoperative nausea and vomiting)    only after 1979 surgery   RA (rheumatoid arthritis) ([place])    "problems in feet, hands, and knees"   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/05/2017   SVT (supraventricular tachycardia) ([place])    chronic    Tubulovillous adenoma of colon     Past Surgical History:  Procedure Laterality  Date   ABDOMINAL HYSTERECTOMY     APPENDECTOMY     BUNIONECTOMY  10/2009 right & 02/03/10 left   COLONOSCOPY W/ POLYPECTOMY  2015   HAMMER TOE SURGERY     "all toes have pins, done with bunionectomy"   INGUINAL HERNIA REPAIR     KNEE CLOSED REDUCTION Left 12/05/2017   Procedure: CLOSED MANIPULATION LEFT KNEE;  Surgeon: [name], MD;  Location: [facility];  Service: Orthopedics;  Laterality: Left;   MASTECTOMY WITH RADIOACTIVE SEED GUIDED EXCISION AND AXILLARY SENTINEL LYMPH NODE BIOPSY Bilateral 08/21/2018   Procedure: BILATERAL SIMPLE MASTECTOMIES WITH LEFT AXILLARY RADIOACTIVE SEED GUIDED LYMPH NODE EXCISION AND LEFT AXILLARY SENTINEL LYMPH NODE BIOPSY;  Surgeon: Erroll Luna, MD;  Location: [facility];  Service: General;  Laterality: Bilateral;   neck fusion     x3   PORT-A-CATH REMOVAL Right 05/15/2019   Procedure: REMOVAL PORT-A-CATH;  Surgeon: Erroll Luna, MD;  Location: [facility];  Service: General;  Laterality: Right;   PORTACATH PLACEMENT Right 09/25/2018   Procedure: INSERTION PORT-A-CATH WITH ULTRASOUND ERAS PATHWAY;  Surgeon: Erroll Luna, MD;  Location: [facility];  Service: General;  Laterality: Right;   THYROIDECTOMY  2001   for nodules   TOE AMPUTATION  1979   6th toe removed from each foot   TOTAL  KNEE ARTHROPLASTY Left 10/21/2014   Procedure: LEFT TOTAL KNEE ARTHROPLASTY;  Surgeon: [name], MD;  Location: [facility];  Service: Orthopedics;  Laterality: Left;   TOTAL KNEE ARTHROPLASTY Right 12/05/2017   Procedure: RIGHT TOTAL KNEE ARTHROPLASTY;  Surgeon: [name], MD;  Location: [facility];  Service: Orthopedics;  Laterality: Right;  70 mins    Prior to Admission medications   Medication Sig Start Date End Date Taking? Authorizing Provider  acetaminophen (TYLENOL) 500 MG tablet Take 1,000 mg by mouth every 6 (six) hours as needed for moderate pain.   Yes [provider]  benazepril-hydrochlorthiazide (LOTENSIN HCT) 10-12.5 MG  tablet Take 1 tablet by mouth daily. 08/03/21  Yes Koberlein, Junell C, MD  buPROPion (WELLBUTRIN XL) 150 MG 24 hr tablet Take 1 tablet (150 mg total) by mouth daily. 08/03/21  Yes Koberlein, Junell C, MD  busPIRone (BUSPAR) 15 MG tablet START WITH 1/2 TABLET TWO TIMES DAILY X2 WEEKS THEN INCREASE TO 1 TABLET TWICE DAILY Patient taking differently: Take 15 mg by mouth daily. START WITH 1/2 TABLET TWO TIMES DAILY X2 WEEKS THEN INCREASE TO 1 TABLET TWICE DAILY 05/18/21  Yes Koberlein, Junell C, MD  diphenhydrAMINE (BENADRYL) 25 MG tablet Take 25-50 mg by mouth every 6 (six) hours as needed for allergies.   Yes [provider]  ibuprofen (ADVIL) 800 MG tablet Take 1 tablet (800 mg total) by mouth every 8 (eight) hours as needed. 05/15/19  Yes Cornett, Marcello Moores, MD  letrozole [facility]) 2.5 MG tablet TAKE 1 TABLET(2.5 MG) BY MOUTH DAILY 05/03/21  Yes Nicholas Lose, MD  LORazepam (ATIVAN) 0.5 MG tablet Take 1 tablet (0.5 mg total) by mouth once as needed for up to 1 dose for anxiety. Take 1 tab 1 hour before scan and 1 tab 30 mins before scan 05/04/21  Yes Nicholas Lose, MD  SYNTHROID 175 MCG tablet Take 1 tablet (175 mcg total) by mouth daily before breakfast. 08/03/21  Yes Koberlein, Junell C, MD  traZODone (DESYREL) 50 MG tablet Take 0.5-1 tablets (25-50 mg total) by mouth at bedtime as needed for sleep. 12/18/20  Yes Koberlein, Junell C, MD  gabapentin (NEURONTIN) 300 MG capsule Take 1 capsule (300 mg total) by mouth at bedtime. Patient not taking: Reported on 10/22/2021 05/17/21   Nicholas Lose, MD  Vitamin D, Ergocalciferol, (DRISDOL) 1.25 MG (50000 UNIT) CAPS capsule Take 1 capsule (50,000 Units total) by mouth every 7 (seven) days. Patient not taking: Reported on 10/22/2021 08/03/21   Caren Macadam, MD  prochlorperazine (COMPAZINE) 10 MG tablet Take 1 tablet (10 mg total) by mouth every 6 (six) hours as needed (Nausea or vomiting). Patient not taking: Reported on 11/18/2018 10/18/18 12/04/18  Nicholas Lose, MD    Current Outpatient Medications  Medication Sig Dispense Refill   acetaminophen (TYLENOL) 500 MG tablet Take 1,000 mg by mouth every 6 (six) hours as needed for moderate pain.     benazepril-hydrochlorthiazide (LOTENSIN HCT) 10-12.5 MG tablet Take 1 tablet by mouth daily. 30 tablet 5   buPROPion (WELLBUTRIN XL) 150 MG 24 hr tablet Take 1 tablet (150 mg total) by mouth daily. 90 tablet 1   busPIRone (BUSPAR) 15 MG tablet START WITH 1/2 TABLET TWO TIMES DAILY X2 WEEKS THEN INCREASE TO 1 TABLET TWICE DAILY (Patient taking differently: Take 15 mg by mouth daily. START WITH 1/2 TABLET TWO TIMES DAILY X2 WEEKS THEN INCREASE TO 1 TABLET TWICE DAILY) 180 tablet 0   diphenhydrAMINE (BENADRYL) 25 MG tablet Take 25-50  mg by mouth every 6 (six) hours as needed for allergies.     ibuprofen (ADVIL) 800 MG tablet Take 1 tablet (800 mg total) by mouth every 8 (eight) hours as needed. 30 tablet 0   letrozole (FEMARA) 2.5 MG tablet TAKE 1 TABLET(2.5 MG) BY MOUTH DAILY 90 tablet 0   LORazepam (ATIVAN) 0.5 MG tablet Take 1 tablet (0.5 mg total) by mouth once as needed for up to 1 dose for anxiety. Take 1 tab 1 hour before scan and 1 tab 30 mins before scan 2 tablet 0   SYNTHROID 175 MCG tablet Take 1 tablet (175 mcg total) by mouth daily before breakfast. 30 tablet 5   traZODone (DESYREL) 50 MG tablet Take 0.5-1 tablets (25-50 mg total) by mouth at bedtime as needed for sleep. 90 tablet 1   gabapentin (NEURONTIN) 300 MG capsule Take 1 capsule (300 mg total) by mouth at bedtime. (Patient not taking: Reported on 10/22/2021) 90 capsule 3   Vitamin D, Ergocalciferol, (DRISDOL) 1.25 MG (50000 UNIT) CAPS capsule Take 1 capsule (50,000 Units total) by mouth every 7 (seven) days. (Patient not taking: Reported on 10/22/2021) 8 capsule 0   Current Facility-Administered Medications  Medication Dose Route Frequency Provider Last Rate Last Admin   0.9 %  sodium chloride infusion  500 mL Intravenous Once Terin Dierolf, Venia Minks, MD       Facility-Administered Medications Ordered in Other Visits  Medication Dose Route Frequency Provider Last Rate Last Admin   sodium chloride flush (NS) 0.9 % injection 10 mL  10 mL Intracatheter PRN Nicholas Lose, MD        Allergies as of 10/22/2021 - Review Complete 10/22/2021  Allergen Reaction Noted   Compazine [prochlorperazine edisylate] Anxiety 11/18/2018   Hydrocodone-acetaminophen Itching    Latex Itching 09/13/2013   Tramadol hcl Itching and Nausea Only 06/24/2008    Family History  Problem Relation Age of Onset   Breast cancer Mother 69       metastatic to brain, died at 3   Dementia Father        died at 94   Cancer Sister    Schizophrenia Brother    Cancer Brother    COPD Maternal Aunt    Cancer Paternal Uncle        details unk   Heart attack Maternal Grandmother    Stroke Maternal Grandmother    Diabetes Maternal Grandmother    Heart disease Maternal Grandmother    Thyroid disease Maternal Grandmother    Esophageal cancer Neg Hx    Rectal cancer Neg Hx    Stomach cancer Neg Hx     Social History   Socioeconomic History   Marital status: Married    Spouse name: Not on file   Number of children: 2   Years of education: 14   Highest education level: Not on file  Occupational History    Employer: [employer]  Tobacco Use   Smoking status: Former    Packs/day: 0.50    Years: 33.00    Pack years: 16.50    Types: Cigarettes    Quit date: 09/15/2011    Years since quitting: 10.1   Smokeless tobacco: Never  Vaping Use   Vaping Use: Never used  Substance and Sexual Activity   Alcohol use: Yes    Comment: rare   Drug use: No   Sexual activity: Not Currently    Birth control/protection: Surgical  Other Topics Concern   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 Married - 2006  1 son '92  1 dtr '94. Work - at [employer].   Social Determinants of Health   Financial Resource Strain: Not on file   Food Insecurity: Not on file  Transportation Needs: Not on file  Physical Activity: Not on file  Stress: Not on file  Social Connections: Not on file  Intimate Partner Violence: Not on file    Review of Systems:  All other review of systems negative except as mentioned in the HPI.  Physical Exam: Vital signs in last 24 hours: BP (!) 159/81    Pulse 83    Temp (!) 96.6 F (35.9 C)    Ht 5\' 4"  (1.626 m)    Wt 217 lb (98.4 kg)    SpO2 95%    BMI 37.25 kg/m  General:   Alert, NAD Lungs:  Clear .   Heart:  Regular rate and rhythm Abdomen:  Soft, nontender and nondistended. Neuro/Psych:  Alert and cooperative. Normal mood and affect. A and O x 3  Reviewed labs, radiology imaging, old records and pertinent past GI work up  Patient is appropriate for planned procedure(s) and anesthesia in an ambulatory setting   K. Denzil Magnuson , MD [phone]

## 2021-10-26 ENCOUNTER — Telehealth: Payer: Self-pay

## 2021-10-26 NOTE — Telephone Encounter (Signed)
°  Follow up Call-  Call back number 10/22/2021  Post procedure Call Back phone  # [phone]  Permission to leave phone message Yes  Some recent data might be hidden     Patient questions:  Do you have a fever, pain , or abdominal swelling? No. Pain Score  0 *  Have you tolerated food without any problems? Yes.    Have you been able to return to your normal activities? Yes.    Do you have any questions about your discharge instructions: Diet   No. Medications  No. Follow up visit  No.  Do you have questions or concerns about your Care? No.  Actions: * If pain score is 4 or above: No action needed, pain <4.

## 2021-10-28 ENCOUNTER — Encounter: Payer: Self-pay | Admitting: Rheumatology

## 2021-10-28 ENCOUNTER — Other Ambulatory Visit: Payer: Self-pay

## 2021-10-28 ENCOUNTER — Ambulatory Visit (INDEPENDENT_AMBULATORY_CARE_PROVIDER_SITE_OTHER): Payer: BC Managed Care – PPO | Admitting: Rheumatology

## 2021-10-28 VITALS — BP 145/88 | HR 91 | Ht 62.0 in | Wt 221.0 lb

## 2021-10-28 DIAGNOSIS — E559 Vitamin D deficiency, unspecified: Secondary | ICD-10-CM

## 2021-10-28 DIAGNOSIS — M19071 Primary osteoarthritis, right ankle and foot: Secondary | ICD-10-CM | POA: Diagnosis not present

## 2021-10-28 DIAGNOSIS — M19041 Primary osteoarthritis, right hand: Secondary | ICD-10-CM | POA: Diagnosis not present

## 2021-10-28 DIAGNOSIS — Z170 Estrogen receptor positive status [ER+]: Secondary | ICD-10-CM

## 2021-10-28 DIAGNOSIS — F32A Depression, unspecified: Secondary | ICD-10-CM

## 2021-10-28 DIAGNOSIS — F5101 Primary insomnia: Secondary | ICD-10-CM

## 2021-10-28 DIAGNOSIS — Z96653 Presence of artificial knee joint, bilateral: Secondary | ICD-10-CM

## 2021-10-28 DIAGNOSIS — M19072 Primary osteoarthritis, left ankle and foot: Secondary | ICD-10-CM

## 2021-10-28 DIAGNOSIS — R700 Elevated erythrocyte sedimentation rate: Secondary | ICD-10-CM

## 2021-10-28 DIAGNOSIS — M5030 Other cervical disc degeneration, unspecified cervical region: Secondary | ICD-10-CM

## 2021-10-28 DIAGNOSIS — F419 Anxiety disorder, unspecified: Secondary | ICD-10-CM

## 2021-10-28 DIAGNOSIS — I10 Essential (primary) hypertension: Secondary | ICD-10-CM

## 2021-10-28 DIAGNOSIS — I671 Cerebral aneurysm, nonruptured: Secondary | ICD-10-CM

## 2021-10-28 DIAGNOSIS — M79662 Pain in left lower leg: Secondary | ICD-10-CM

## 2021-10-28 DIAGNOSIS — M19042 Primary osteoarthritis, left hand: Secondary | ICD-10-CM

## 2021-10-28 DIAGNOSIS — Z8639 Personal history of other endocrine, nutritional and metabolic disease: Secondary | ICD-10-CM

## 2021-10-28 DIAGNOSIS — C50412 Malignant neoplasm of upper-outer quadrant of left female breast: Secondary | ICD-10-CM

## 2021-10-28 NOTE — Patient Instructions (Addendum)
Hand Exercises Hand exercises can be helpful for almost anyone. These exercises can strengthen the hands, improve flexibility and movement, and increase blood flow to the hands. These results can make work and daily tasks easier. Hand exercises can be especially helpful for people who have joint pain from arthritis or have nerve damage from overuse (carpal tunnel syndrome). These exercises can also help people who have injured a hand. Exercises Most of these hand exercises are gentle stretching and motion exercises. It is usually safe to do them often throughout the day. Warming up your hands before exercise may help to reduce stiffness. You can do this with gentle massage or by placing your hands in warm water for 10-15 minutes. It is normal to feel some stretching, pulling, tightness, or mild discomfort as you begin new exercises. This will gradually improve. Stop an exercise right away if you feel sudden, severe pain or your pain gets worse. Ask your health care provider which exercises are best for you. Knuckle bend or "claw" fist  Stand or sit with your arm, hand, and all five fingers pointed straight up. Make sure to keep your wrist straight during the exercise. Gently bend your fingers down toward your palm until the tips of your fingers are touching the top of your palm. Keep your big knuckle straight and just bend the small knuckles in your fingers. Hold this position for __________ seconds. Straighten (extend) your fingers back to the starting position. Repeat this exercise 5-10 times with each hand. Full finger fist  Stand or sit with your arm, hand, and all five fingers pointed straight up. Make sure to keep your wrist straight during the exercise. Gently bend your fingers into your palm until the tips of your fingers are touching the middle of your palm. Hold this position for __________ seconds. Extend your fingers back to the starting position, stretching every joint fully. Repeat  this exercise 5-10 times with each hand. Straight fist Stand or sit with your arm, hand, and all five fingers pointed straight up. Make sure to keep your wrist straight during the exercise. Gently bend your fingers at the big knuckle, where your fingers meet your hand, and the middle knuckle. Keep the knuckle at the tips of your fingers straight and try to touch the bottom of your palm. Hold this position for __________ seconds. Extend your fingers back to the starting position, stretching every joint fully. Repeat this exercise 5-10 times with each hand. Tabletop  Stand or sit with your arm, hand, and all five fingers pointed straight up. Make sure to keep your wrist straight during the exercise. Gently bend your fingers at the big knuckle, where your fingers meet your hand, as far down as you can while keeping the small knuckles in your fingers straight. Think of forming a tabletop with your fingers. Hold this position for __________ seconds. Extend your fingers back to the starting position, stretching every joint fully. Repeat this exercise 5-10 times with each hand. Finger spread  Place your hand flat on a table with your palm facing down. Make sure your wrist stays straight as you do this exercise. Spread your fingers and thumb apart from each other as far as you can until you feel a gentle stretch. Hold this position for __________ seconds. Bring your fingers and thumb tight together again. Hold this position for __________ seconds. Repeat this exercise 5-10 times with each hand. Making circles  Stand or sit with your arm, hand, and all five fingers pointed  straight up. Make sure to keep your wrist straight during the exercise. Make a circle by touching the tip of your thumb to the tip of your index finger. Hold for __________ seconds. Then open your hand wide. Repeat this motion with your thumb and each finger on your hand. Repeat this exercise 5-10 times with each hand. Thumb  motion  Sit with your forearm resting on a table and your wrist straight. Your thumb should be facing up toward the ceiling. Keep your fingers relaxed as you move your thumb. Lift your thumb up as high as you can toward the ceiling. Hold for __________ seconds. Bend your thumb across your palm as far as you can, reaching the tip of your thumb for the small finger (pinkie) side of your palm. Hold for __________ seconds. Repeat this exercise 5-10 times with each hand. Grip strengthening  Hold a stress ball or other soft ball in the middle of your hand. Slowly increase the pressure, squeezing the ball as much as you can without causing pain. Think of bringing the tips of your fingers into the middle of your palm. All of your finger joints should bend when doing this exercise. Hold your squeeze for __________ seconds, then relax. Repeat this exercise 5-10 times with each hand. Contact a health care provider if: Your hand pain or discomfort gets much worse when you do an exercise. Your hand pain or discomfort does not improve within 2 hours after you exercise. If you have any of these problems, stop doing these exercises right away. Do not do them again unless your health care provider says that you can. Get help right away if: You develop sudden, severe hand pain or swelling. If this happens, stop doing these exercises right away. Do not do them again unless your health care provider says that you can. This information is not intended to replace advice given to you by your health care provider. Make sure you discuss any questions you have with your health care provider. Document Revised: 12/24/2020 Document Reviewed: 12/24/2020 Elsevier Patient Education  [place].    Heart Disease Prevention   Your inflammatory disease increases your risk of heart disease which includes heart attack, stroke, atrial fibrillation (irregular heartbeats), high blood pressure, heart failure and  atherosclerosis (plaque in the arteries).  It is important to reduce your risk by:   Keep blood pressure, cholesterol, and blood sugar at healthy levels   Smoking Cessation   Maintain a healthy weight  BMI 20-25   Eat a healthy diet  Plenty of fresh fruit, vegetables, and whole grains  Limit saturated fats, foods high in sodium, and added sugars  DASH and Mediterranean diet   Increase physical activity  Recommend moderate physically activity for 150 minutes per week/ 30 minutes a day for five days a week These can be broken up into three separate ten-minute sessions during the day.   Reduce Stress  Meditation, slow breathing exercises, yoga, coloring books  Dental visits twice a year

## 2021-11-04 ENCOUNTER — Encounter: Payer: Self-pay | Admitting: Gastroenterology

## 2021-11-10 NOTE — Progress Notes (Signed)
Office Visit Note  Patient: Paula Jacobs             Date of Birth: Apr 01, 1961           MRN: [id number]             PCP: Caren Macadam, MD Referring: Caren Macadam, MD Visit Date: 11/11/2021 Occupation: @GUAROCC @  Subjective:  Left shoulder pain  History of Present Illness: Paula Jacobs is a 61 y.o. female with a history of osteoarthritis and degenerative disc disease.  She states that she has been having off-and-on discomfort in her left shoulder for the last 1 year.  She woke up with severe pain and discomfort in her left shoulder joint a week ago.  She states the pain is progressively getting worse.  She is having difficulty raising her left arm.  She does not recall any history of injury.  She continues to have discomfort in her hands and her feet.  She states she has a spasm in her right hand off-and-on.  Activities of Daily Living:  Patient reports morning stiffness for all day.   Patient Reports nocturnal pain.  Difficulty dressing/grooming: Reports Difficulty climbing stairs: Denies Difficulty getting out of chair: Denies Difficulty using hands for taps, buttons, cutlery, and/or writing: Denies  Review of Systems  Constitutional:  Negative for fatigue.  HENT:  Negative for mouth sores, mouth dryness and nose dryness.   Eyes:  Positive for itching. Negative for pain and dryness.  Respiratory:  Negative for shortness of breath and difficulty breathing.   Cardiovascular:  Negative for chest pain and palpitations.  Gastrointestinal:  Negative for blood in stool, constipation and diarrhea.  Endocrine: Negative for increased urination.  Genitourinary:  Negative for difficulty urinating.  Musculoskeletal:  Positive for joint pain, joint pain, myalgias, morning stiffness, muscle tenderness and myalgias. Negative for joint swelling.  Skin:  Negative for color change, rash and redness.  Allergic/Immunologic: Negative for susceptible to infections.   Neurological:  Negative for dizziness, numbness, headaches, memory loss and weakness.  Hematological:  Negative for bruising/bleeding tendency.  Psychiatric/Behavioral:  Negative for confusion.    PMFS History:  Patient Active Problem List   Diagnosis Date Noted   Port-A-Cath in place 09/26/2018   Breast cancer, left ([place]) 08/22/2018   Breast cancer, stage 2, left ([place]) 08/21/2018   Genetic testing 08/08/2018   Family history of breast cancer    Malignant neoplasm of upper-outer quadrant of left breast in female, estrogen receptor positive ([place]) 07/19/2018   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/05/2017   Aneurysm ([place]) 10/19/2017   Morbid obesity ([place]) 10/22/2014   Rheumatoid arthritis ([place]) 09/26/2013   Hypertension 01/31/2011   Hypothyroidism 10/17/2007    Past Medical History:  Diagnosis Date   ANEMIA-NOS 04/23/2008   Aneurysm ([place]) 10/19/2017   Evaluated by [facility] 10/2017, 7mm, stable   Anginal pain ([place])    went to ED in april 2018 c/o chest pain over last 2 months ; had EKG, CXR  and troponin negative per physician suspected musculoskeletal  ; dc'd with ibuprofen  and recc f/u with outpatient stress test; see care everywhere ED visit  ; patient denies recurrence of Chest pain since, endorses occ palpitations    Anxiety    Cancer ([place]) 2019   left breast ca- had bil mast   DEPRESSION 04/23/2008   DJD (degenerative joint disease)    Family history of breast cancer    Hyperplastic colon polyp    x2   Hypertension  HYPOTHYROIDISM 10/17/2007   Osteoarthritis    Palpitations    freq at night;  at pre-op states she drinks caffeine and that makes it worse; says the palpitations started after they took her thyroid    PAT 10/27/2009   Qualifier: Diagnosis of  By: [name], MD, [name]     Pneumonia    "in the past, havent had it in years"   PONV (postoperative nausea and vomiting)    only after 1979 surgery   RA (rheumatoid arthritis) ([place])    "problems in feet, hands, and knees"   S/P left TKA  10/21/2014   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/05/2017   SVT (supraventricular tachycardia) ([facility])    chronic    Tubulovillous adenoma of colon     Family History  Problem Relation Age of Onset   Breast cancer Mother 78       metastatic to brain, died at 42   Dementia Father        died at 6   Cancer Sister    Schizophrenia Brother    Cancer Brother    COPD Maternal Aunt    Cancer Paternal Uncle        details unk   Heart attack Maternal Grandmother    Stroke Maternal Grandmother    Diabetes Maternal Grandmother    Heart disease Maternal Grandmother    Thyroid disease Maternal Grandmother    Esophageal cancer Neg Hx    Rectal cancer Neg Hx    Stomach cancer Neg Hx    Past Surgical History:  Procedure Laterality Date   ABDOMINAL HYSTERECTOMY     APPENDECTOMY     BUNIONECTOMY  10/2009 right & 02/03/10 left   COLONOSCOPY  10/22/2021   with biopsy   COLONOSCOPY W/ POLYPECTOMY  09/19/2013   HAMMER TOE SURGERY     "all toes have pins, done with bunionectomy"   INGUINAL HERNIA REPAIR     KNEE CLOSED REDUCTION Left 12/05/2017   Procedure: CLOSED MANIPULATION LEFT KNEE;  Surgeon: [name], MD;  Location: [facility];  Service: Orthopedics;  Laterality: Left;   MASTECTOMY WITH RADIOACTIVE SEED GUIDED EXCISION AND AXILLARY SENTINEL LYMPH NODE BIOPSY Bilateral 08/21/2018   Procedure: BILATERAL SIMPLE MASTECTOMIES WITH LEFT AXILLARY RADIOACTIVE SEED GUIDED LYMPH NODE EXCISION AND LEFT AXILLARY SENTINEL LYMPH NODE BIOPSY;  Surgeon: Erroll Luna, MD;  Location: [facility];  Service: General;  Laterality: Bilateral;   neck fusion     x3   PORT-A-CATH REMOVAL Right 05/15/2019   Procedure: REMOVAL PORT-A-CATH;  Surgeon: Erroll Luna, MD;  Location: [facility];  Service: General;  Laterality: Right;   PORTACATH PLACEMENT Right 09/25/2018   Procedure: INSERTION PORT-A-CATH WITH ULTRASOUND ERAS PATHWAY;  Surgeon: Erroll Luna, MD;  Location: [facility];  Service: General;   Laterality: Right;   THYROIDECTOMY  09/20/1999   for nodules   TOE AMPUTATION  09/19/1977   6th toe removed from each foot   TOTAL KNEE ARTHROPLASTY Left 10/21/2014   Procedure: LEFT TOTAL KNEE ARTHROPLASTY;  Surgeon: [name], MD;  Location: [facility];  Service: Orthopedics;  Laterality: Left;   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 Married - 2006  1 son '92  1 dtr '94. Work - at [employer].   Immunization  History  Administered Date(s) Administered   Influenza,inj,Quad PF,6+ Mos 06/02/2015, 10/12/2017   Influenza,inj,quad, With Preservative 10/12/2017   PFIZER(Purple Top)SARS-COV-2 Vaccination 11/21/2019, 12/18/2019   PPD Test 07/23/2015, 08/06/2015   Td 03/10/2010   Tdap 07/26/2021     Objective: Vital Signs: BP (!) 167/106 (BP Location: Right Arm, Patient Position: Sitting, Cuff Size: Large)    Pulse 83    Ht 5\' 2"  (1.575 m)    Wt 222 lb (100.7 kg)    BMI 40.60 kg/m    Physical Exam Vitals and nursing note reviewed.  Constitutional:      Appearance: She is well-developed.  HENT:     Head: Normocephalic and atraumatic.  Eyes:     Conjunctiva/sclera: Conjunctivae normal.  Cardiovascular:     Rate and Rhythm: Normal rate and regular rhythm.     Heart sounds: Normal heart sounds.  Pulmonary:     Effort: Pulmonary effort is normal.     Breath sounds: Normal breath sounds.  Abdominal:     General: Bowel sounds are normal.     Palpations: Abdomen is soft.  Musculoskeletal:     Cervical back: Normal range of motion.  Lymphadenopathy:     Cervical: No cervical adenopathy.  Skin:    General: Skin is warm and dry.     Capillary Refill: Capillary refill takes less than 2 seconds.  Neurological:     Mental Status: She is alert and oriented to  person, place, and time.  Psychiatric:        Behavior: Behavior normal.     Musculoskeletal Exam: She had some limitation with lateral rotation of the cervical spine without discomfort.  She has good range of motion of her right shoulder joint.  She had painful forward flexion, abduction and internal rotation of her left shoulder joint.  She had most discomfort with abduction of her left shoulder.  Elbow joints and wrist joints with good range of motion.  There was no tenderness over MCPs PIPs and DIPs.  Hip joints are in good range of motion.  Knee joints are replaced without any warmth swelling or effusion.  There was no tenderness over ankles or MTPs.  CDAI Exam: CDAI Score: -- Patient Global: --; Provider Global: -- Swollen: --; Tender: -- Joint Exam 11/11/2021   No joint exam has been documented for this visit   There is currently no information documented on the homunculus. Go to the Rheumatology activity and complete the homunculus joint exam.  Investigation: No additional findings.  Imaging: XR Shoulder Left  Result Date: 11/11/2021 No glenohumeral joint space narrowing was noted.  Acromioclavicular narrowing was noted.  No chondrocalcinosis was noted. Impression: These findings are consistent with acromioclavicular arthritis   Recent Labs: Lab Results  Component Value Date   WBC 6.7 07/26/2021   HGB 11.9 (L) 07/26/2021   PLT 391.0 07/26/2021   NA 138 07/26/2021   K 3.7 07/26/2021   CL 101 07/26/2021   CO2 30 07/26/2021   GLUCOSE 92 07/26/2021   BUN 21 07/26/2021   CREATININE 0.64 07/26/2021   BILITOT 0.5 07/26/2021   ALKPHOS 63 07/26/2021   AST 15 07/26/2021   ALT 14 07/26/2021   PROT 6.6 10/07/2021   ALBUMIN 4.2 07/26/2021   CALCIUM 9.4 07/26/2021   GFRAA >60 05/09/2019    Speciality Comments: No specialty comments available.  Procedures:  No procedures performed Allergies: Compazine [prochlorperazine edisylate], Hydrocodone-acetaminophen, Latex, and  Tramadol hcl   Assessment / Plan:     Visit Diagnoses:  Chronic left shoulder pain -patient gives history of left shoulder joint pain off and on for the last 1 year.  She states that for the last 1 week she has been having progressively increased pain in her left shoulder to the point she is having difficulty raising her arm.  Plan: XR Shoulder Left.  X-ray findings were reviewed with the patient.  X-ray showed acromioclavicular arthritis.  No glenohumeral narrowing was noted.  I advised topical diclofenac gel and exercises.  If her symptoms persist we can consider cortisone injection in the future.  Her blood pressure was elevated today therefore we avoided cortisone injection.  If her shoulder joint pain persists and the blood pressure is normal then she will contact us for cortisone injection.  I also offered physical therapy.  Patient will notify us if she wants to be referred.  Primary osteoarthritis of both hands -she has off-and-on discomfort in her hands.  She complains of discomfort in her right fifth finger.  No swelling or synovitis was noted.  X-rays were obtained at the last visit which were consistent with osteoarthritis.  Status post total bilateral knee replacement - Left total knee replacement 2016 and right total knee replacement 2019 by Dr. Alvan Dame.  She had good plane range of motion bilateral knee joints without any warmth swelling or effusion.  Primary osteoarthritis of both feet - Status post bilateral bunionectomies.  She had x-rays of her bilateral feet at the last visit.  Clinical and radiographic findings were consistent with osteoarthritis.  DDD (degenerative disc disease), cervical - S/p fusion x3.   Elevated sed rate - Repeat sed rate was normal.  All autoimmune work-up was negative.  Other medical problems are listed as follows:  Primary hypertension-her blood pressure is elevated today.  Patient states she has been taking ibuprofen.  I advised her to stop ibuprofen and  monitor blood pressure closely.  She will contact her PCP.  Brain aneurysm  Malignant neoplasm of upper-outer quadrant of left breast in female, estrogen receptor positive ([place])  Anxiety and depression  History of hypothyroidism  Primary insomnia  Morbid obesity ([place])  Vitamin D deficiency  Orders: Orders Placed This Encounter  Procedures   XR Shoulder Left   No orders of the defined types were placed in this encounter.    Follow-Up Instructions: Return for Osteoarthritis.   Bo Merino, MD  Note - This record has been created using Editor, commissioning.  Chart creation errors have been sought, but may not always  have been located. Such creation errors do not reflect on  the standard of medical care.

## 2021-11-11 ENCOUNTER — Other Ambulatory Visit: Payer: Self-pay

## 2021-11-11 ENCOUNTER — Ambulatory Visit (INDEPENDENT_AMBULATORY_CARE_PROVIDER_SITE_OTHER): Payer: BC Managed Care – PPO | Admitting: Rheumatology

## 2021-11-11 ENCOUNTER — Encounter: Payer: Self-pay | Admitting: Rheumatology

## 2021-11-11 ENCOUNTER — Ambulatory Visit: Payer: Self-pay

## 2021-11-11 VITALS — BP 167/106 | HR 83 | Ht 62.0 in | Wt 222.0 lb

## 2021-11-11 DIAGNOSIS — F5101 Primary insomnia: Secondary | ICD-10-CM

## 2021-11-11 DIAGNOSIS — I10 Essential (primary) hypertension: Secondary | ICD-10-CM

## 2021-11-11 DIAGNOSIS — Z96653 Presence of artificial knee joint, bilateral: Secondary | ICD-10-CM

## 2021-11-11 DIAGNOSIS — Z170 Estrogen receptor positive status [ER+]: Secondary | ICD-10-CM

## 2021-11-11 DIAGNOSIS — M19071 Primary osteoarthritis, right ankle and foot: Secondary | ICD-10-CM | POA: Diagnosis not present

## 2021-11-11 DIAGNOSIS — M5030 Other cervical disc degeneration, unspecified cervical region: Secondary | ICD-10-CM

## 2021-11-11 DIAGNOSIS — M19042 Primary osteoarthritis, left hand: Secondary | ICD-10-CM

## 2021-11-11 DIAGNOSIS — M25512 Pain in left shoulder: Secondary | ICD-10-CM | POA: Diagnosis not present

## 2021-11-11 DIAGNOSIS — M19072 Primary osteoarthritis, left ankle and foot: Secondary | ICD-10-CM

## 2021-11-11 DIAGNOSIS — R700 Elevated erythrocyte sedimentation rate: Secondary | ICD-10-CM

## 2021-11-11 DIAGNOSIS — I671 Cerebral aneurysm, nonruptured: Secondary | ICD-10-CM

## 2021-11-11 DIAGNOSIS — F32A Depression, unspecified: Secondary | ICD-10-CM

## 2021-11-11 DIAGNOSIS — M19041 Primary osteoarthritis, right hand: Secondary | ICD-10-CM

## 2021-11-11 DIAGNOSIS — E559 Vitamin D deficiency, unspecified: Secondary | ICD-10-CM

## 2021-11-11 DIAGNOSIS — F419 Anxiety disorder, unspecified: Secondary | ICD-10-CM

## 2021-11-11 DIAGNOSIS — Z8639 Personal history of other endocrine, nutritional and metabolic disease: Secondary | ICD-10-CM

## 2021-11-11 DIAGNOSIS — C50412 Malignant neoplasm of upper-outer quadrant of left female breast: Secondary | ICD-10-CM

## 2021-11-11 DIAGNOSIS — G8929 Other chronic pain: Secondary | ICD-10-CM

## 2021-11-11 NOTE — Patient Instructions (Signed)
Shoulder Exercises Ask your health care provider which exercises are safe for you. Do exercises exactly as told by your health care provider and adjust them as directed. It is normal to feel mild stretching, pulling, tightness, or discomfort as you do these exercises. Stop right away if you feel sudden pain or your pain gets worse. Do not begin these exercises until told by your health care provider. Stretching exercises External rotation and abduction This exercise is sometimes called corner stretch. This exercise rotates your arm outward (external rotation) and moves your arm out from your body (abduction). Stand in a doorway with one of your feet slightly in front of the other. This is called a staggered stance. If you cannot reach your forearms to the door frame, stand facing a corner of a room. Choose one of the following positions as told by your health care provider: Place your hands and forearms on the door frame above your head. Place your hands and forearms on the door frame at the height of your head. Place your hands on the door frame at the height of your elbows. Slowly move your weight onto your front foot until you feel a stretch across your chest and in the front of your shoulders. Keep your head and chest upright and keep your abdominal muscles tight. Hold for __________ seconds. To release the stretch, shift your weight to your back foot. Repeat __________ times. Complete this exercise __________ times a day. Extension, standing Stand and hold a broomstick, a cane, or a similar object behind your back. Your hands should be a little wider than shoulder width apart. Your palms should face away from your back. Keeping your elbows straight and your shoulder muscles relaxed, move the stick away from your body until you feel a stretch in your shoulders (extension). Avoid shrugging your shoulders while you move the stick. Keep your shoulder blades tucked down toward the middle of your  back. Hold for __________ seconds. Slowly return to the starting position. Repeat __________ times. Complete this exercise __________ times a day. Range-of-motion exercises Pendulum  Stand near a wall or a surface that you can hold onto for balance. Bend at the waist and let your left / right arm hang straight down. Use your other arm to support you. Keep your back straight and do not lock your knees. Relax your left / right arm and shoulder muscles, and move your hips and your trunk so your left / right arm swings freely. Your arm should swing because of the motion of your body, not because you are using your arm or shoulder muscles. Keep moving your hips and trunk so your arm swings in the following directions, as told by your health care provider: Side to side. Forward and backward. In clockwise and counterclockwise circles. Continue each motion for __________ seconds, or for as long as told by your health care provider. Slowly return to the starting position. Repeat __________ times. Complete this exercise __________ times a day. Shoulder flexion, standing  Stand and hold a broomstick, a cane, or a similar object. Place your hands a little more than shoulder width apart on the object. Your left / right hand should be palm up, and your other hand should be palm down. Keep your elbow straight and your shoulder muscles relaxed. Push the stick up with your healthy arm to raise your left / right arm in front of your body, and then over your head until you feel a stretch in your shoulder (flexion). Avoid   shrugging your shoulder while you raise your arm. Keep your shoulder blade tucked down toward the middle of your back. Hold for __________ seconds. Slowly return to the starting position. Repeat __________ times. Complete this exercise __________ times a day. Shoulder abduction, standing Stand and hold a broomstick, a cane, or a similar object. Place your hands a little more than shoulder  width apart on the object. Your left / right hand should be palm up, and your other hand should be palm down. Keep your elbow straight and your shoulder muscles relaxed. Push the object across your body toward your left / right side. Raise your left / right arm to the side of your body (abduction) until you feel a stretch in your shoulder. Do not raise your arm above shoulder height unless your health care provider tells you to do that. If directed, raise your arm over your head. Avoid shrugging your shoulder while you raise your arm. Keep your shoulder blade tucked down toward the middle of your back. Hold for __________ seconds. Slowly return to the starting position. Repeat __________ times. Complete this exercise __________ times a day. Internal rotation  Place your left / right hand behind your back, palm up. Use your other hand to dangle an exercise band, a towel, or a similar object over your shoulder. Grasp the band with your left / right hand so you are holding on to both ends. Gently pull up on the band until you feel a stretch in the front of your left / right shoulder. The movement of your arm toward the center of your body is called internal rotation. Avoid shrugging your shoulder while you raise your arm. Keep your shoulder blade tucked down toward the middle of your back. Hold for __________ seconds. Release the stretch by letting go of the band and lowering your hands. Repeat __________ times. Complete this exercise __________ times a day. Strengthening exercises External rotation  Sit in a stable chair without armrests. Secure an exercise band to a stable object at elbow height on your left / right side. Place a soft object, such as a folded towel or a small pillow, between your left / right upper arm and your body to move your elbow about 4 inches (10 cm) away from your side. Hold the end of the exercise band so it is tight and there is no slack. Keeping your elbow pressed  against the soft object, slowly move your forearm out, away from your abdomen (external rotation). Keep your body steady so only your forearm moves. Hold for __________ seconds. Slowly return to the starting position. Repeat __________ times. Complete this exercise __________ times a day. Shoulder abduction  Sit in a stable chair without armrests, or stand up. Hold a __________ weight in your left / right hand, or hold an exercise band with both hands. Start with your arms straight down and your left / right palm facing in, toward your body. Slowly lift your left / right hand out to your side (abduction). Do not lift your hand above shoulder height unless your health care provider tells you that this is safe. Keep your arms straight. Avoid shrugging your shoulder while you do this movement. Keep your shoulder blade tucked down toward the middle of your back. Hold for __________ seconds. Slowly lower your arm, and return to the starting position. Repeat __________ times. Complete this exercise __________ times a day. Shoulder extension Sit in a stable chair without armrests, or stand up. Secure an exercise band   to a stable object in front of you so it is at shoulder height. Hold one end of the exercise band in each hand. Your palms should face each other. Straighten your elbows and lift your hands up to shoulder height. Step back, away from the secured end of the exercise band, until the band is tight and there is no slack. Squeeze your shoulder blades together as you pull your hands down to the sides of your thighs (extension). Stop when your hands are straight down by your sides. Do not let your hands go behind your body. Hold for __________ seconds. Slowly return to the starting position. Repeat __________ times. Complete this exercise __________ times a day. Shoulder row Sit in a stable chair without armrests, or stand up. Secure an exercise band to a stable object in front of you so it  is at waist height. Hold one end of the exercise band in each hand. Position your palms so that your thumbs are facing the ceiling (neutral position). Bend each of your elbows to a 90-degree angle (right angle) and keep your upper arms at your sides. Step back until the band is tight and there is no slack. Slowly pull your elbows back behind you. Hold for __________ seconds. Slowly return to the starting position. Repeat __________ times. Complete this exercise __________ times a day. Shoulder press-ups  Sit in a stable chair that has armrests. Sit upright, with your feet flat on the floor. Put your hands on the armrests so your elbows are bent and your fingers are pointing forward. Your hands should be about even with the sides of your body. Push down on the armrests and use your arms to lift yourself off the chair. Straighten your elbows and lift yourself up as much as you comfortably can. Move your shoulder blades down, and avoid letting your shoulders move up toward your ears. Keep your feet on the ground. As you get stronger, your feet should support less of your body weight as you lift yourself up. Hold for __________ seconds. Slowly lower yourself back into the chair. Repeat __________ times. Complete this exercise __________ times a day. Wall push-ups  Stand so you are facing a stable wall. Your feet should be about one arm-length away from the wall. Lean forward and place your palms on the wall at shoulder height. Keep your feet flat on the floor as you bend your elbows and lean forward toward the wall. Hold for __________ seconds. Straighten your elbows to push yourself back to the starting position. Repeat __________ times. Complete this exercise __________ times a day. This information is not intended to replace advice given to you by your health care provider. Make sure you discuss any questions you have with your healthcare provider. Document Revised: 12/28/2018 Document  Reviewed: 10/05/2018 [employer] Patient Education  2022 [employer]  

## 2021-12-09 NOTE — Progress Notes (Signed)
? ?Office Visit Note ? ?Patient: Paula Jacobs             ?Date of Birth: [birth year]           ?MRN: [id number]             ?PCP: Caren Macadam, MD ?Referring: Caren Macadam, MD ?Visit Date: 12/23/2021 ?Occupation: '@GUAROCC'$ @ ? ?Subjective:  ?Pain in left shoulder ? ?History of Present Illness: Paula Jacobs is a 61 y.o. female with history of osteoarthritis.  She states she continues to have pain and discomfort in the left shoulder joint.  The pain is gradually getting worse.  She would like to have a cortisone injection today.  At the last visit we could not do a cortisone injection due to elevated blood pressure.  She has been experiencing nocturnal pain and also difficulty getting dressed due to shoulder discomfort.  She continues to have some stiffness in her hands and her feet.  Her bilateral total knee replacements are doing well.   ? ?Activities of Daily Living:  ?Patient reports morning stiffness for several hours.   ?Patient Reports nocturnal pain.  ?Difficulty dressing/grooming: Reports ?Difficulty climbing stairs: Denies ?Difficulty getting out of chair: Denies ?Difficulty using hands for taps, buttons, cutlery, and/or writing: Denies ? ?Review of Systems  ?Constitutional:  Negative for fatigue.  ?HENT:  Negative for mouth sores, mouth dryness and nose dryness.   ?Eyes:  Positive for itching. Negative for pain and dryness.  ?Respiratory:  Negative for shortness of breath and difficulty breathing.   ?Cardiovascular:  Negative for chest pain and palpitations.  ?Gastrointestinal:  Negative for blood in stool, constipation and diarrhea.  ?Endocrine: Negative for increased urination.  ?Genitourinary:  Negative for difficulty urinating.  ?Musculoskeletal:  Positive for joint pain, joint pain and morning stiffness. Negative for joint swelling, myalgias, muscle tenderness and myalgias.  ?Skin:  Negative for color change, rash and redness.  ?Allergic/Immunologic: Negative for susceptible to  infections.  ?Neurological:  Positive for headaches. Negative for dizziness, numbness, memory loss and weakness.  ?Hematological:  Negative for bruising/bleeding tendency.  ?Psychiatric/Behavioral:  Negative for confusion.   ? ?PMFS History:  ?Patient Active Problem List  ? Diagnosis Date Noted  ? Primary osteoarthritis of both hands 12/23/2021  ? Primary osteoarthritis of both feet 12/23/2021  ? DDD (degenerative disc disease), cervical 12/23/2021  ? Port-A-Cath in place 09/26/2018  ? Breast cancer, left ([place]) 08/22/2018  ? Breast cancer, stage 2, left ([place]) 08/21/2018  ? Genetic testing 08/08/2018  ? Family history of breast cancer   ? Malignant neoplasm of upper-outer quadrant of left breast in female, estrogen receptor positive ([place]) 07/19/2018  ? 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/05/2017  ? Aneurysm ([place]) 10/19/2017  ? Morbid obesity ([place]) 10/22/2014  ? Hypertension 01/31/2011  ? Hypothyroidism 10/17/2007  ?  ?Past Medical History:  ?Diagnosis Date  ? ANEMIA-NOS 04/23/2008  ? Aneurysm ([place]) 10/19/2017  ? Evaluated by [facility] 10/2017, 73m, stable  ? Anginal pain ([place]   ? went to ED in april 2018 c/o chest pain over last 2 months ; had EKG, CXR  and troponin negative per physician suspected musculoskeletal  ; dc'd with ibuprofen  and recc f/u with outpatient stress test; see care everywhere ED visit  ; patient denies recurrence of Chest pain since, endorses occ palpitations   ? Anxiety   ? Cancer ([facility] 2019  ? left breast ca- had bil mast  ? DEPRESSION 04/23/2008  ? DJD (degenerative joint disease)   ?  Family history of breast cancer   ? Hyperplastic colon polyp   ? x2  ? Hypertension   ? HYPOTHYROIDISM 10/17/2007  ? Osteoarthritis   ? Palpitations   ? freq at night;  at pre-op states she drinks caffeine and that makes it worse; says the palpitations started after they took her thyroid   ? PAT 10/27/2009  ? Qualifier: Diagnosis of  By: [name], MD, [name]    ? Pneumonia   ? "in the past, havent had it in years"  ? PONV (postoperative nausea  and vomiting)   ? only after 1979 surgery  ? RA (rheumatoid arthritis) ([place])   ? "problems in feet, hands, and knees"  ? 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  ? 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/05/2017  ? SVT (supraventricular tachycardia) ([place])   ? chronic   ? Tubulovillous adenoma of colon   ?  ?Family History  ?Problem Relation Age of Onset  ? Breast cancer Mother 39  ?     metastatic to brain, died at 25  ? Dementia Father   ?     died at 77  ? Cancer Sister   ? Schizophrenia Brother   ? Cancer Brother   ? COPD Maternal Aunt   ? Cancer Paternal Uncle   ?     details unk  ? Heart attack Maternal Grandmother   ? Stroke Maternal Grandmother   ? Diabetes Maternal Grandmother   ? Heart disease Maternal Grandmother   ? Thyroid disease Maternal Grandmother   ? Esophageal cancer Neg Hx   ? Rectal cancer Neg Hx   ? Stomach cancer Neg Hx   ? ?Past Surgical History:  ?Procedure Laterality Date  ? ABDOMINAL HYSTERECTOMY    ? APPENDECTOMY    ? BUNIONECTOMY  10/2009 right & 02/03/10 left  ? COLONOSCOPY  10/22/2021  ? with biopsy  ? COLONOSCOPY W/ POLYPECTOMY  09/19/2013  ? HAMMER TOE SURGERY    ? "all toes have pins, done with bunionectomy"  ? INGUINAL HERNIA REPAIR    ? KNEE CLOSED REDUCTION Left 12/05/2017  ? Procedure: CLOSED MANIPULATION LEFT KNEE;  Surgeon: [name], MD;  Location: [facility];  Service: Orthopedics;  Laterality: Left;  ? MASTECTOMY WITH RADIOACTIVE SEED GUIDED EXCISION AND AXILLARY SENTINEL LYMPH NODE BIOPSY Bilateral 08/21/2018  ? Procedure: BILATERAL SIMPLE MASTECTOMIES WITH LEFT AXILLARY RADIOACTIVE SEED GUIDED LYMPH NODE EXCISION AND LEFT AXILLARY SENTINEL LYMPH NODE BIOPSY;  Surgeon: Erroll Luna, MD;  Location: [facility];  Service: General;  Laterality: Bilateral;  ? neck fusion    ? x3  ? PORT-A-CATH REMOVAL Right 05/15/2019  ? Procedure: REMOVAL PORT-A-CATH;  Surgeon: Erroll Luna, MD;  Location: [facility];  Service: General;  Laterality: Right;  ? PORTACATH PLACEMENT Right 09/25/2018  ? Procedure: INSERTION  PORT-A-CATH WITH ULTRASOUND ERAS PATHWAY;  Surgeon: Erroll Luna, MD;  Location: [place];  Service: General;  Laterality: Right;  ? THYROIDECTOMY  09/20/1999  ? for nodules  ? TOE AMPUTATION  09/19/1977  ? 6th toe removed from each foot  ? TOTAL KNEE ARTHROPLASTY Left 10/21/2014  ? Procedure: LEFT TOTAL KNEE ARTHROPLASTY;  Surgeon: [name], MD;  Location: [facility];  Service: Orthopedics;  Laterality: Left;  ? TOTAL KNEE ARTHROPLASTY Right 12/05/2017  ? Procedure: RIGHT TOTAL KNEE ARTHROPLASTY;  Surgeon: [name], MD;  Location: [facility];  Service: Orthopedics;  Laterality: Right;  70 mins  ? ?Social History  ? ?Social History Narrative  ? 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  Married - 2006  1 son '92  1 dtr '94. Work - at [employer].  ? ?Immunization History  ?Administered Date(s) Administered  ? Influenza,inj,Quad PF,6+ Mos 06/02/2015, 10/12/2017  ? Influenza,inj,quad, With Preservative 10/12/2017  ? PFIZER(Purple Top)SARS-COV-2 Vaccination 11/21/2019, 12/18/2019  ? PPD Test 07/23/2015, 08/06/2015  ? Td 03/10/2010  ? Tdap 07/26/2021  ?  ? ?Objective: ?Vital Signs: BP (!) 146/91 (BP Location: Right Arm, Patient Position: Sitting, Cuff Size: Large)   Pulse (!) 102   Ht '5\' 2"'$  (1.575 m)   Wt 220 lb 12.8 oz (100.2 kg)   BMI 40.38 kg/m?   ? ?Physical Exam ?Vitals and nursing note reviewed.  ?Constitutional:   ?   Appearance: She is well-developed.  ?HENT:  ?   Head: Normocephalic and atraumatic.  ?Eyes:  ?   Conjunctiva/sclera: Conjunctivae normal.  ?Cardiovascular:  ?   Rate and Rhythm: Normal rate and regular rhythm.  ?   Heart sounds: Normal heart sounds.  ?Pulmonary:  ?   Effort: Pulmonary effort is normal.  ?   Breath sounds: Normal breath sounds.  ?Abdominal:  ?   General: Bowel sounds are normal.  ?   Palpations: Abdomen is soft.  ?Musculoskeletal:  ?   Cervical back: Normal range of motion.  ?Lymphadenopathy:  ?   Cervical: No cervical adenopathy.  ?Skin: ?   General: Skin is warm  and dry.  ?   Capillary Refill: Capillary refill takes less than 2 seconds.  ?Neurological:  ?   Mental Status: She is alert and oriented to person, place, and time.  ?Psychiatric:     ?   Behavior: Behavior

## 2021-12-23 ENCOUNTER — Ambulatory Visit (INDEPENDENT_AMBULATORY_CARE_PROVIDER_SITE_OTHER): Payer: BC Managed Care – PPO | Admitting: Rheumatology

## 2021-12-23 ENCOUNTER — Encounter: Payer: Self-pay | Admitting: Rheumatology

## 2021-12-23 VITALS — BP 146/91 | HR 102 | Ht 62.0 in | Wt 220.8 lb

## 2021-12-23 DIAGNOSIS — M19041 Primary osteoarthritis, right hand: Secondary | ICD-10-CM | POA: Diagnosis not present

## 2021-12-23 DIAGNOSIS — R700 Elevated erythrocyte sedimentation rate: Secondary | ICD-10-CM

## 2021-12-23 DIAGNOSIS — F5101 Primary insomnia: Secondary | ICD-10-CM

## 2021-12-23 DIAGNOSIS — I10 Essential (primary) hypertension: Secondary | ICD-10-CM

## 2021-12-23 DIAGNOSIS — E559 Vitamin D deficiency, unspecified: Secondary | ICD-10-CM

## 2021-12-23 DIAGNOSIS — G8929 Other chronic pain: Secondary | ICD-10-CM

## 2021-12-23 DIAGNOSIS — M19072 Primary osteoarthritis, left ankle and foot: Secondary | ICD-10-CM

## 2021-12-23 DIAGNOSIS — Z96653 Presence of artificial knee joint, bilateral: Secondary | ICD-10-CM

## 2021-12-23 DIAGNOSIS — M19071 Primary osteoarthritis, right ankle and foot: Secondary | ICD-10-CM

## 2021-12-23 DIAGNOSIS — M19042 Primary osteoarthritis, left hand: Secondary | ICD-10-CM

## 2021-12-23 DIAGNOSIS — C50412 Malignant neoplasm of upper-outer quadrant of left female breast: Secondary | ICD-10-CM

## 2021-12-23 DIAGNOSIS — F32A Depression, unspecified: Secondary | ICD-10-CM

## 2021-12-23 DIAGNOSIS — M25512 Pain in left shoulder: Secondary | ICD-10-CM

## 2021-12-23 DIAGNOSIS — I671 Cerebral aneurysm, nonruptured: Secondary | ICD-10-CM

## 2021-12-23 DIAGNOSIS — Z8639 Personal history of other endocrine, nutritional and metabolic disease: Secondary | ICD-10-CM

## 2021-12-23 DIAGNOSIS — M5030 Other cervical disc degeneration, unspecified cervical region: Secondary | ICD-10-CM

## 2021-12-23 DIAGNOSIS — F419 Anxiety disorder, unspecified: Secondary | ICD-10-CM

## 2021-12-23 DIAGNOSIS — Z170 Estrogen receptor positive status [ER+]: Secondary | ICD-10-CM

## 2021-12-23 MED ORDER — LIDOCAINE HCL 1 % IJ SOLN
1.5000 mL | INTRAMUSCULAR | Status: AC | PRN
  Administered 2021-12-23: 1.5 mL

## 2021-12-23 MED ORDER — TRIAMCINOLONE ACETONIDE 40 MG/ML IJ SUSP
40.0000 mg | INTRAMUSCULAR | Status: AC | PRN
  Administered 2021-12-23: 40 mg via INTRA_ARTICULAR

## 2021-12-23 NOTE — Addendum Note (Signed)
Addended by: Earnestine Mealing on: 12/23/2021 10:37 AM ? ? Modules accepted: Orders ? ?

## 2022-02-02 ENCOUNTER — Other Ambulatory Visit: Payer: Self-pay | Admitting: *Deleted

## 2022-02-02 MED ORDER — LETROZOLE 2.5 MG PO TABS: ORAL_TABLET | ORAL | 3 refills | Status: DC

## 2022-03-18 DIAGNOSIS — S8002XA Contusion of left knee, initial encounter: Secondary | ICD-10-CM | POA: Diagnosis not present

## 2022-03-18 DIAGNOSIS — Z96653 Presence of artificial knee joint, bilateral: Secondary | ICD-10-CM | POA: Diagnosis not present

## 2022-04-26 ENCOUNTER — Ambulatory Visit (INDEPENDENT_AMBULATORY_CARE_PROVIDER_SITE_OTHER): Payer: BC Managed Care – PPO | Admitting: Family Medicine

## 2022-04-26 ENCOUNTER — Encounter: Payer: Self-pay | Admitting: Family Medicine

## 2022-04-26 VITALS — BP 128/82 | HR 88 | Temp 98.1°F | Ht 62.0 in | Wt 218.2 lb

## 2022-04-26 DIAGNOSIS — Z23 Encounter for immunization: Secondary | ICD-10-CM | POA: Diagnosis not present

## 2022-04-26 DIAGNOSIS — F321 Major depressive disorder, single episode, moderate: Secondary | ICD-10-CM

## 2022-04-26 DIAGNOSIS — E559 Vitamin D deficiency, unspecified: Secondary | ICD-10-CM | POA: Diagnosis not present

## 2022-04-26 DIAGNOSIS — I10 Essential (primary) hypertension: Secondary | ICD-10-CM | POA: Diagnosis not present

## 2022-04-26 DIAGNOSIS — E039 Hypothyroidism, unspecified: Secondary | ICD-10-CM

## 2022-04-26 DIAGNOSIS — G4700 Insomnia, unspecified: Secondary | ICD-10-CM

## 2022-04-26 LAB — COMPREHENSIVE METABOLIC PANEL
ALT: 13 U/L (ref 0–35)
AST: 15 U/L (ref 0–37)
Albumin: 4.3 g/dL (ref 3.5–5.2)
Alkaline Phosphatase: 53 U/L (ref 39–117)
BUN: 14 mg/dL (ref 6–23)
CO2: 29 mEq/L (ref 19–32)
Calcium: 9.5 mg/dL (ref 8.4–10.5)
Chloride: 101 mEq/L (ref 96–112)
Creatinine, Ser: 0.72 mg/dL (ref 0.40–1.20)
GFR: 91.48 mL/min (ref >60.00)
Glucose, Bld: 86 mg/dL (ref 70–99)
Potassium: 3.3 mEq/L — ABNORMAL LOW (ref 3.5–5.1)
Sodium: 143 mEq/L (ref 135–145)
Total Bilirubin: 0.4 mg/dL (ref 0.2–1.2)
Total Protein: 7.3 g/dL (ref 6.0–8.3)

## 2022-04-26 LAB — LIPID PANEL
Cholesterol: 181 mg/dL (ref 0–200)
HDL: 51.3 mg/dL (ref >39.00)
LDL Cholesterol: 111 mg/dL — ABNORMAL HIGH (ref 0–99)
NonHDL: 129.69
Total CHOL/HDL Ratio: 4
Triglycerides: 95 mg/dL (ref 0.0–149.0)
VLDL: 19 mg/dL (ref 0.0–40.0)

## 2022-04-26 LAB — VITAMIN D 25 HYDROXY (VIT D DEFICIENCY, FRACTURES): VITD: 14.72 ng/mL — ABNORMAL LOW (ref 30.00–100.00)

## 2022-04-26 LAB — HEMOGLOBIN A1C: Hgb A1c MFr Bld: 5.4 % (ref 4.6–6.5)

## 2022-04-26 LAB — TSH: TSH: 5.33 u[IU]/mL (ref 0.35–5.50)

## 2022-04-26 MED ORDER — BENAZEPRIL-HYDROCHLOROTHIAZIDE 10-12.5 MG PO TABS: 1.0000 | ORAL_TABLET | Freq: Every day | ORAL | 1 refills | Status: DC

## 2022-04-26 MED ORDER — VITAMIN D (ERGOCALCIFEROL) 1.25 MG (50000 UNIT) PO CAPS: 50000.0000 [IU] | ORAL_CAPSULE | ORAL | 1 refills | Status: DC

## 2022-04-26 MED ORDER — TRAZODONE HCL 50 MG PO TABS: 25.0000 mg | ORAL_TABLET | Freq: Every evening | ORAL | 1 refills | Status: AC | PRN

## 2022-04-26 MED ORDER — SYNTHROID 175 MCG PO TABS: 175.0000 ug | ORAL_TABLET | Freq: Every day | ORAL | 1 refills | Status: DC

## 2022-04-26 MED ORDER — BUPROPION HCL ER (XL) 300 MG PO TB24: 300.0000 mg | ORAL_TABLET | Freq: Every day | ORAL | 1 refills | Status: DC

## 2022-04-26 NOTE — Patient Instructions (Addendum)
Calorie goal intake: 1600 calories per day  Goal protein intake: 80 grams per day  Goal fiber intake: 20 grams per day  Lower carbs to under 60 gram per day  Add a multivitamin daily.  Ask your insurance company if they cover [facility] or [place] for weight loss management.

## 2022-04-26 NOTE — Assessment & Plan Note (Signed)
Current hypertension medications:      Sig   benazepril-hydrochlorthiazide (LOTENSIN HCT) 10-12.5 MG tablet Take 1 tablet by mouth daily.    BP is well controlled on the above medication, will continue this, she needs a lipid panel and CMP.

## 2022-04-26 NOTE — Progress Notes (Signed)
Established Patient Office Visit  Subjective   Patient ID: Paula Jacobs, female    DOB: [birth year]  Age: 61 y.o. MRN: [id number]  Chief Complaint  Patient presents with   Establish Care    Patient is here to establish care and follow up. Patient reports she has a history of breast cancer, had a double mastectomy and has been doing well since.   Patient is reporting a history of chronic insomnia and is concerned that she is gaining weight, states that she is trying to watch her diet, states she is also engaging in exercise. We discussed options for treatment for this at length including dietary control and possible medications we could use to help her lose weight. I reviewed her medications, it is possible that the letrozole or the trazodone could be contributing to the weight gain.  Had a thyroidectomy and is on daily thyroid replacement medication. Needs her TSH checked.   HTN - BP performed in office today. Reviewed with patient, she thinks she is swelling a bit more than usual. No chest pain, no SOB, no headaches or palpitations. She reports she is compliant with her medications and denies any side effects.      Review of Systems  All other systems reviewed and are negative.     Objective:     BP 128/82 (BP Location: Right Arm, Patient Position: Sitting, Cuff Size: Large)   Pulse 88   Temp 98.1 F (36.7 C) (Oral)   Ht '5\' 2"'$  (1.575 m)   Wt 218 lb 3.2 oz (99 kg)   SpO2 96%   BMI 39.91 kg/m    Physical Exam Vitals reviewed.  Constitutional:      Appearance: Normal appearance. She is well-groomed. She is obese.  HENT:     Head: Normocephalic and atraumatic.  Eyes:     Extraocular Movements: Extraocular movements intact.     Conjunctiva/sclera: Conjunctivae normal.     Pupils: Pupils are equal, round, and reactive to light.  Cardiovascular:     Rate and Rhythm: Normal rate and regular rhythm.     Pulses: Normal pulses.     Heart sounds: S1 normal and S2  normal.  Pulmonary:     Effort: Pulmonary effort is normal.     Breath sounds: Normal breath sounds and air entry.  Abdominal:     General: Bowel sounds are normal.     Palpations: Abdomen is soft.  Musculoskeletal:        General: Normal range of motion.     Cervical back: Normal range of motion and neck supple.     Right lower leg: Edema (trace ankle edema) present.     Left lower leg: Edema (trace ankle edema) present.  Skin:    General: Skin is warm and dry.  Neurological:     Mental Status: She is alert and oriented to person, place, and time. Mental status is at baseline.     Gait: Gait is intact.  Psychiatric:        Mood and Affect: Mood and affect normal.        Speech: Speech normal.        Behavior: Behavior normal.        Judgment: Judgment normal.      No results found for any visits on 04/26/22.    The 10-year ASCVD risk score (Arnett DK, et al., 2019) is: 11.2%    Assessment & Plan:   Problem List Items Addressed This Visit  Cardiovascular and Mediastinum   Hypertension - Primary    Current hypertension medications:       Sig   benazepril-hydrochlorthiazide (LOTENSIN HCT) 10-12.5 MG tablet Take 1 tablet by mouth daily.  BP is well controlled on the above medication, will continue this, she needs a lipid panel and CMP.      Relevant Medications   benazepril-hydrochlorthiazide (LOTENSIN HCT) 10-12.5 MG tablet   Other Relevant Orders   Lipid Panel   CMP     Endocrine   Hypothyroidism    Needs new TSH today, continue the 175 mcg dose of levothyroxine for now, will adjust as needed depending on the results of her TSH      Relevant Medications   SYNTHROID 175 MCG tablet   Other Relevant Orders   TSH     Other   Morbid obesity ([place])    We had a long discussion about dietary control and the use of medication to help with her appetite control. I gave her specific goals for her calorie intake, carbs, protein and fiber requirements. I advised  that she call her insurance company to see if they cover weight loss services. If so she is to call me and I will start her on either [facility] or Saxenda for weight loss. I increased her wellbutrin to 300 mg daily to help reduce cravings and to help with her weight loss goals.       Relevant Orders   Hemoglobin A1c   Other Visit Diagnoses     Depression, major, single episode, moderate (HCC)       Relevant Medications   buPROPion (WELLBUTRIN XL) 300 MG 24 hr tablet   traZODone (DESYREL) 50 MG tablet   Essential hypertension       Relevant Medications   benazepril-hydrochlorthiazide (LOTENSIN HCT) 10-12.5 MG tablet   Insomnia, unspecified type       Relevant Medications   traZODone (DESYREL) 50 MG tablet   Vitamin D deficiency       Relevant Medications   Vitamin D, Ergocalciferol, (DRISDOL) 1.25 MG (50000 UNIT) CAPS capsule   Other Relevant Orders   Vitamin D, 25-hydroxy   Need for shingles vaccine       Relevant Orders   Zoster Recombinant (Shingrix ) (Completed)       Return in about 6 months (around 10/27/2022).    Farrel Conners, MD

## 2022-04-26 NOTE — Assessment & Plan Note (Addendum)
We had a long discussion about dietary control and the use of medication to help with her appetite control. I gave her specific goals for her calorie intake, carbs, protein and fiber requirements. I advised that she call her insurance company to see if they cover weight loss services. If so she is to call me and I will start her on either [facility] or Saxenda for weight loss. I increased her wellbutrin to 300 mg daily to help reduce cravings and to help with her weight loss goals.

## 2022-04-26 NOTE — Assessment & Plan Note (Signed)
Needs new TSH today, continue the 175 mcg dose of levothyroxine for now, will adjust as needed depending on the results of her TSH

## 2022-04-27 ENCOUNTER — Encounter (INDEPENDENT_AMBULATORY_CARE_PROVIDER_SITE_OTHER): Payer: Self-pay

## 2022-04-28 ENCOUNTER — Ambulatory Visit: Payer: BC Managed Care – PPO | Admitting: Rheumatology

## 2022-05-17 ENCOUNTER — Inpatient Hospital Stay: Payer: BC Managed Care – PPO | Attending: Hematology and Oncology | Admitting: Hematology and Oncology

## 2022-05-17 NOTE — Assessment & Plan Note (Deleted)
08/21/2018:Bilateral mastectomies: Left mastectomy: IDC grade 3, 2.1 cm, high-grade DCIS, margins negative, negative for LV I, 3/9 lymph nodes positive, ER 95% positive, PR 90% positive, HER-2 -1+, Ki-67 30% T2N1A stage IIa right mastectomy: Benign MammaPrint: High risk luminal type B Echocardiogram 09/18/2018: EF 55 to 60% Patient has been enrolled and upbeat clinical trial: No adverse effects related to the trial  Treatment plan: 1.Adjuvant chemotherapy with dose dense Adriamycin and Cytoxan x4 followed by weekly Taxol x1discontinued due to intolerance 2.Followed [facility] 3.Followed by adjuvant antiestrogen therapystarted 05/03/2019 ------------------------------------------------------------------------------------------------------------------ Current Treatment:Anastrozole 1 mg daily started 8/14/2020discontinued soon after because of nausea Currently on letrozole  Left lower chest wall discomfort: It is tender to touch I suspect may be costochondritis or intercostal myalgia.   CT chest 05/13/2021: No evidence of metastatic disease in the chest I recommended starting her on gabapentin 3 mg nightly.  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  She is excited about this new job which will offer her opportunity to exercise.  Breast cancer surveillance: 1.Breast exams 05/17/2022: Bilateral mastectomy scars tenderness in the left lower chest wall 2.No role of routine imaging studies because she had bilateral mastectomies   Return to clinic in 1 year for follow-up

## 2022-05-25 ENCOUNTER — Telehealth: Payer: Self-pay | Admitting: Family Medicine

## 2022-05-25 NOTE — Telephone Encounter (Signed)
Pt called to request refills, but when I checked her medication list, all 3 Rx's were started on 04/26/22.  Pt stated she would call back.

## 2022-05-26 NOTE — Telephone Encounter (Signed)
Left a detailed message requesting the patient call back with more information as I was calling for clarification as refills for Benazepril, Bupropion, Synthroid and Trazodone were all sent to [facility] on 8/8.

## 2022-06-10 NOTE — Progress Notes (Deleted)
Office Visit Note  Patient: Paula Jacobs             Date of Birth: [birth year]           MRN: [id number]             PCP: Farrel Conners, MD Referring: Caren Macadam, MD Visit Date: 06/24/2022 Occupation: '@GUAROCC'$ @  Subjective:  No chief complaint on file.   History of Present Illness: Paula Jacobs is a 61 y.o. female ***   Activities of Daily Living:  Patient reports morning stiffness for *** {minute/hour:19697}.   Patient {ACTIONS;DENIES/REPORTS:21021675::"Denies"} nocturnal pain.  Difficulty dressing/grooming: {ACTIONS;DENIES/REPORTS:21021675::"Denies"} Difficulty climbing stairs: {ACTIONS;DENIES/REPORTS:21021675::"Denies"} Difficulty getting out of chair: {ACTIONS;DENIES/REPORTS:21021675::"Denies"} Difficulty using hands for taps, buttons, cutlery, and/or writing: {ACTIONS;DENIES/REPORTS:21021675::"Denies"}  No Rheumatology ROS completed.   PMFS History:  Patient Active Problem List   Diagnosis Date Noted  . Primary osteoarthritis of both hands 12/23/2021  . Primary osteoarthritis of both feet 12/23/2021  . DDD (degenerative disc disease), cervical 12/23/2021  . Port-A-Cath in place 09/26/2018  . Breast cancer, left ([place]) 08/22/2018  . Breast cancer, stage 2, left ([place]) 08/21/2018  . Genetic testing 08/08/2018  . Family history of breast cancer   . Malignant neoplasm of upper-outer quadrant of left breast in female, estrogen receptor positive ([place]) 07/19/2018  . 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 Aneurysm ([place]) 10/19/2017  . Morbid obesity ([place]) 10/22/2014  . Hypertension 01/31/2011  . Hypothyroidism 10/17/2007    Past Medical History:  Diagnosis Date  . ANEMIA-NOS 04/23/2008  . Aneurysm ([place]) 10/19/2017   Evaluated by MRA 10/2017, 76m, stable  . Anginal pain ([place]    went to ED in april 2018 c/o chest pain over last 2 months ; had EKG, CXR  and troponin negative per physician suspected musculoskeletal  ; dc'd with ibuprofen  and recc f/u with  outpatient stress test; see care everywhere ED visit  ; patient denies recurrence of Chest pain since, endorses occ palpitations   . Anxiety   . Cancer ([facility] 2019   left breast ca- had bil mast  . DEPRESSION 04/23/2008  . DJD (degenerative joint disease)   . Family history of breast cancer   . Hyperplastic colon polyp    x2  . Hypertension   . HYPOTHYROIDISM 10/17/2007  . Osteoarthritis   . Palpitations    freq at night;  at pre-op states she drinks caffeine and that makes it worse; says the palpitations started after they took her thyroid   . PAT 10/27/2009   Qualifier: Diagnosis of  By: MAundra Dubin MD, [name]    . Pneumonia    "in the past, havent had it in years"  . PONV (postoperative nausea and vomiting)    only after 1979 surgery  . RA (rheumatoid arthritis) ([place]    "problems in feet, hands, and knees"  . 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 SVT (supraventricular tachycardia) (HCC)    chronic   . Tubulovillous adenoma of colon     Family History  Problem Relation Age of Onset  . Breast cancer Mother 435      metastatic to brain, died at 443 . Dementia Father        died at 738 . Cancer Sister   . Schizophrenia Brother   . Cancer Brother   . COPD Maternal Aunt   . Cancer Paternal Uncle        details unk  . Heart  attack Maternal Grandmother   . Stroke Maternal Grandmother   . Diabetes Maternal Grandmother   . Heart disease Maternal Grandmother   . Thyroid disease Maternal Grandmother   . Esophageal cancer Neg Hx   . Rectal cancer Neg Hx   . Stomach cancer Neg Hx    Past Surgical History:  Procedure Laterality Date  . ABDOMINAL HYSTERECTOMY    . APPENDECTOMY    . BUNIONECTOMY  10/2009 right & 02/03/10 left  . COLONOSCOPY  10/22/2021   with biopsy  . COLONOSCOPY W/ POLYPECTOMY  09/19/2013  . HAMMER TOE SURGERY     "all toes have pins, done with bunionectomy"  . INGUINAL HERNIA REPAIR    . KNEE CLOSED REDUCTION Left 12/05/2017   Procedure: CLOSED  MANIPULATION LEFT KNEE;  Surgeon: [name], MD;  [facility];  Service: Orthopedics;  Laterality: Left;  Marland Kitchen MASTECTOMY WITH RADIOACTIVE SEED GUIDED EXCISION AND AXILLARY SENTINEL LYMPH NODE BIOPSY Bilateral 08/21/2018   Procedure: BILATERAL SIMPLE MASTECTOMIES WITH LEFT AXILLARY RADIOACTIVE SEED GUIDED LYMPH NODE EXCISION AND LEFT AXILLARY SENTINEL LYMPH NODE BIOPSY;  Surgeon: Erroll Luna, MD;  Location: [facility];  Service: General;  Laterality: Bilateral;  . neck fusion     x3  . PORT-A-CATH REMOVAL Right 05/15/2019   Procedure: REMOVAL PORT-A-CATH;  Surgeon: Erroll Luna, MD;  Location: [facility];  Service: General;  Laterality: Right;  . PORTACATH PLACEMENT Right 09/25/2018   Procedure: INSERTION PORT-A-CATH WITH ULTRASOUND ERAS PATHWAY;  Surgeon: Erroll Luna, MD;  Location: [facility];  Service: General;  Laterality: Right;  . THYROIDECTOMY  09/20/1999   for nodules  . TOE AMPUTATION  09/19/1977   6th toe removed from each foot  . TOTAL KNEE ARTHROPLASTY Left 10/21/2014   Procedure: LEFT TOTAL KNEE ARTHROPLASTY;  Surgeon: [name], MD;  [facility];  Service: Orthopedics;  Laterality: Left;  . 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 Married - 2006  1 son '92  1 dtr '94. Work - at [employer].   Immunization History  Administered Date(s) Administered  . Influenza,inj,Quad PF,6+ Mos 06/02/2015, 10/12/2017  . Influenza,inj,quad, With Preservative 10/12/2017  . PFIZER(Purple Top)SARS-COV-2 Vaccination 11/21/2019, 12/18/2019  . PPD Test 07/23/2015, 08/06/2015  . Td 03/10/2010  . Tdap 07/26/2021  . Zoster Recombinat (Shingrix) 04/26/2022     Objective: Vital Signs: There were no vitals taken for this  visit.   Physical Exam   Musculoskeletal Exam: ***  CDAI Exam: CDAI Score: -- Patient Global: --; Provider Global: -- Swollen: --; Tender: -- Joint Exam 06/24/2022   No joint exam has been documented for this visit   There is currently no information documented on the homunculus. Go to the Rheumatology activity and complete the homunculus joint exam.  Investigation: No additional findings.  Imaging: No results found.  Recent Labs: Lab Results  Component Value Date   WBC 6.7 07/26/2021   HGB 11.9 (L) 07/26/2021   PLT 391.0 07/26/2021   NA 143 04/26/2022   K 3.3 (L) 04/26/2022   CL 101 04/26/2022   CO2 29 04/26/2022   GLUCOSE 86 04/26/2022   BUN 14 04/26/2022   CREATININE 0.72 04/26/2022   BILITOT 0.4 04/26/2022   ALKPHOS 53 04/26/2022   AST 15 04/26/2022  ALT 13 04/26/2022   PROT 7.3 04/26/2022   ALBUMIN 4.3 04/26/2022   CALCIUM 9.5 04/26/2022   GFRAA >60 05/09/2019    Speciality Comments: No specialty comments available.  Procedures:  No procedures performed Allergies: Compazine [prochlorperazine edisylate], Hydrocodone-acetaminophen, Latex, and Tramadol hcl   Assessment / Plan:     Visit Diagnoses: No diagnosis found.  Orders: No orders of the defined types were placed in this encounter.  No orders of the defined types were placed in this encounter.   Face-to-face time spent with patient was *** minutes. Greater than 50% of time was spent in counseling and coordination of care.  Follow-Up Instructions: No follow-ups on file.   Earnestine Mealing, CMA  Note - This record has been created using Editor, commissioning.  Chart creation errors have been sought, but may not always  have been located. Such creation errors do not reflect on  the standard of medical care.

## 2022-06-21 ENCOUNTER — Inpatient Hospital Stay: Payer: BC Managed Care – PPO | Admitting: Hematology and Oncology

## 2022-06-21 NOTE — Assessment & Plan Note (Deleted)
08/21/2018:Bilateral mastectomies: Left mastectomy: IDC grade 3, 2.1 cm, high-grade DCIS, margins negative, negative for LV I, 3/9 lymph nodes positive, ER 95% positive, PR 90% positive, HER-2 -1+, Ki-67 30% T2N1A stage IIa right mastectomy: Benign MammaPrint: High risk luminal type B Echocardiogram 09/18/2018: EF 55 to 60% Patient has been enrolled and upbeat clinical trial: No adverse effects related to the trial  Treatment plan: 1.Adjuvant chemotherapy with dose dense Adriamycin and Cytoxan x4 followed by weekly Taxol x1discontinued due to intolerance 2.Followed [facility] 3.Followed by adjuvant antiestrogen therapystarted 05/03/2019 ------------------------------------------------------------------------------------------------------------------ Current Treatment:Anastrozole 1 mg daily started 8/14/2020discontinued soon after because of nausea Currently on letrozole  Left lower chest wall discomfort: It is tender to touch I suspect may be costochondritis or intercostal myalgia.   CT chest 05/13/2021: No evidence of metastatic disease in the chest I recommended starting her on gabapentin 3 mg nightly.  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  She is excited about this new job which will offer her opportunity to exercise.  Breast cancer surveillance: 1.Breast exams 06/21/22: Bilateral mastectomy scars tenderness in the left lower chest wall 2.No role of routine imaging studies because she had bilateral mastectomies   Return to clinic in 1 year for follow-up

## 2022-06-24 ENCOUNTER — Ambulatory Visit: Payer: BC Managed Care – PPO | Admitting: Physician Assistant

## 2022-06-24 DIAGNOSIS — Z170 Estrogen receptor positive status [ER+]: Secondary | ICD-10-CM

## 2022-06-24 DIAGNOSIS — E559 Vitamin D deficiency, unspecified: Secondary | ICD-10-CM

## 2022-06-24 DIAGNOSIS — F419 Anxiety disorder, unspecified: Secondary | ICD-10-CM

## 2022-06-24 DIAGNOSIS — I671 Cerebral aneurysm, nonruptured: Secondary | ICD-10-CM

## 2022-06-24 DIAGNOSIS — Z96653 Presence of artificial knee joint, bilateral: Secondary | ICD-10-CM

## 2022-06-24 DIAGNOSIS — M19041 Primary osteoarthritis, right hand: Secondary | ICD-10-CM

## 2022-06-24 DIAGNOSIS — G8929 Other chronic pain: Secondary | ICD-10-CM

## 2022-06-24 DIAGNOSIS — R700 Elevated erythrocyte sedimentation rate: Secondary | ICD-10-CM

## 2022-06-24 DIAGNOSIS — F5101 Primary insomnia: Secondary | ICD-10-CM

## 2022-06-24 DIAGNOSIS — I10 Essential (primary) hypertension: Secondary | ICD-10-CM

## 2022-06-24 DIAGNOSIS — M19071 Primary osteoarthritis, right ankle and foot: Secondary | ICD-10-CM

## 2022-06-24 DIAGNOSIS — M5030 Other cervical disc degeneration, unspecified cervical region: Secondary | ICD-10-CM

## 2022-06-24 DIAGNOSIS — Z8639 Personal history of other endocrine, nutritional and metabolic disease: Secondary | ICD-10-CM

## 2022-06-27 NOTE — Progress Notes (Deleted)
Office Visit Note  Patient: Paula Jacobs             Date of Birth: 08-26-1961           MRN: [id number]             PCP: Farrel Conners, MD Referring: Caren Macadam, MD Visit Date: 06/29/2022 Occupation: '@GUAROCC'$ @  Subjective:    History of Present Illness: Paula Jacobs is a 61 y.o. female with history of osteoarthritis and DDD.   Activities of Daily Living:  Patient reports morning stiffness for *** {minute/hour:19697}.   Patient {ACTIONS;DENIES/REPORTS:21021675::"Denies"} nocturnal pain.  Difficulty dressing/grooming: {ACTIONS;DENIES/REPORTS:21021675::"Denies"} Difficulty climbing stairs: {ACTIONS;DENIES/REPORTS:21021675::"Denies"} Difficulty getting out of chair: {ACTIONS;DENIES/REPORTS:21021675::"Denies"} Difficulty using hands for taps, buttons, cutlery, and/or writing: {ACTIONS;DENIES/REPORTS:21021675::"Denies"}  No Rheumatology ROS completed.   PMFS History:  Patient Active Problem List   Diagnosis Date Noted  . Primary osteoarthritis of both hands 12/23/2021  . Primary osteoarthritis of both feet 12/23/2021  . DDD (degenerative disc disease), cervical 12/23/2021  . Port-A-Cath in place 09/26/2018  . Breast cancer, left ([place]) 08/22/2018  . Breast cancer, stage 2, left ([place]) 08/21/2018  . Genetic testing 08/08/2018  . Family history of breast cancer   . Malignant neoplasm of upper-outer quadrant of left breast in female, estrogen receptor positive ([place]) 07/19/2018  . 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 Aneurysm ([place]) 10/19/2017  . Morbid obesity ([place]) 10/22/2014  . Hypertension 01/31/2011  . Hypothyroidism 10/17/2007    Past Medical History:  Diagnosis Date  . ANEMIA-NOS 04/23/2008  . Aneurysm ([place]) 10/19/2017   Evaluated by MRA 10/2017, 35m, stable  . Anginal pain ([place]    went to ED in april 2018 c/o chest pain over last 2 months ; had EKG, CXR  and troponin negative per physician suspected musculoskeletal  ; dc'd with ibuprofen  and recc f/u  with outpatient stress test; see care everywhere ED visit  ; patient denies recurrence of Chest pain since, endorses occ palpitations   . Anxiety   . Cancer ([place] 2019   left breast ca- had bil mast  . DEPRESSION 04/23/2008  . DJD (degenerative joint disease)   . Family history of breast cancer   . Hyperplastic colon polyp    x2  . Hypertension   . HYPOTHYROIDISM 10/17/2007  . Osteoarthritis   . Palpitations    freq at night;  at pre-op states she drinks caffeine and that makes it worse; says the palpitations started after they took her thyroid   . PAT 10/27/2009   Qualifier: Diagnosis of  By: MAundra Dubin MD, [name]    . Pneumonia    "in the past, havent had it in years"  . PONV (postoperative nausea and vomiting)    only after 1979 surgery  . RA (rheumatoid arthritis) ([place]    "problems in feet, hands, and knees"  . 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 SVT (supraventricular tachycardia) (HCC)    chronic   . Tubulovillous adenoma of colon     Family History  Problem Relation Age of Onset  . Breast cancer Mother 462      metastatic to brain, died at 446 . Dementia Father        died at 757 . Cancer Sister   . Schizophrenia Brother   . Cancer Brother   . COPD Maternal Aunt   . Cancer Paternal Uncle        details unk  . Heart  attack Maternal Grandmother   . Stroke Maternal Grandmother   . Diabetes Maternal Grandmother   . Heart disease Maternal Grandmother   . Thyroid disease Maternal Grandmother   . Esophageal cancer Neg Hx   . Rectal cancer Neg Hx   . Stomach cancer Neg Hx    Past Surgical History:  Procedure Laterality Date  . ABDOMINAL HYSTERECTOMY    . APPENDECTOMY    . BUNIONECTOMY  10/2009 right & 02/03/10 left  . COLONOSCOPY  10/22/2021   with biopsy  . COLONOSCOPY W/ POLYPECTOMY  09/19/2013  . HAMMER TOE SURGERY     "all toes have pins, done with bunionectomy"  . INGUINAL HERNIA REPAIR    . KNEE CLOSED REDUCTION Left 12/05/2017   Procedure:  CLOSED MANIPULATION LEFT KNEE;  Surgeon: [name], MD;  [facility];  Service: Orthopedics;  Laterality: Left;  Marland Kitchen MASTECTOMY WITH RADIOACTIVE SEED GUIDED EXCISION AND AXILLARY SENTINEL LYMPH NODE BIOPSY Bilateral 08/21/2018   Procedure: BILATERAL SIMPLE MASTECTOMIES WITH LEFT AXILLARY RADIOACTIVE SEED GUIDED LYMPH NODE EXCISION AND LEFT AXILLARY SENTINEL LYMPH NODE BIOPSY;  Surgeon: Erroll Luna, MD;  Location: [facility];  Service: General;  Laterality: Bilateral;  . neck fusion     x3  . PORT-A-CATH REMOVAL Right 05/15/2019   Procedure: REMOVAL PORT-A-CATH;  Surgeon: Erroll Luna, MD;  Location: [facility];  Service: General;  Laterality: Right;  . PORTACATH PLACEMENT Right 09/25/2018   Procedure: INSERTION PORT-A-CATH WITH ULTRASOUND ERAS PATHWAY;  Surgeon: Erroll Luna, MD;  Location: [facility];  Service: General;  Laterality: Right;  . THYROIDECTOMY  09/20/1999   for nodules  . TOE AMPUTATION  09/19/1977   6th toe removed from each foot  . TOTAL KNEE ARTHROPLASTY Left 10/21/2014   Procedure: LEFT TOTAL KNEE ARTHROPLASTY;  Surgeon: [name], MD;  [facility];  Service: Orthopedics;  Laterality: Left;  . 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 Married - 2006  1 son '92  1 dtr '94. Work - at [employer].   Immunization History  Administered Date(s) Administered  . Influenza,inj,Quad PF,6+ Mos 06/02/2015, 10/12/2017  . Influenza,inj,quad, With Preservative 10/12/2017  . PFIZER(Purple Top)SARS-COV-2 Vaccination 11/21/2019, 12/18/2019  . PPD Test 07/23/2015, 08/06/2015  . Td 03/10/2010  . Tdap 07/26/2021  . Zoster Recombinat (Shingrix) 04/26/2022     Objective: Vital Signs: There were no vitals taken for  this visit.   Physical Exam Vitals and nursing note reviewed.  Constitutional:      Appearance: She is well-developed.  HENT:     Head: Normocephalic and atraumatic.  Eyes:     Conjunctiva/sclera: Conjunctivae normal.  Cardiovascular:     Rate and Rhythm: Normal rate and regular rhythm.     Heart sounds: Normal heart sounds.  Pulmonary:     Effort: Pulmonary effort is normal.     Breath sounds: Normal breath sounds.  Abdominal:     General: Bowel sounds are normal.     Palpations: Abdomen is soft.  Musculoskeletal:     Cervical back: Normal range of motion.  Skin:    General: Skin is warm and dry.     Capillary Refill: Capillary refill takes less than 2 seconds.  Neurological:     Mental Status: She is  alert and oriented to person, place, and time.  Psychiatric:        Behavior: Behavior normal.     Musculoskeletal Exam: ***  CDAI Exam: CDAI Score: -- Patient Global: --; Provider Global: -- Swollen: --; Tender: -- Joint Exam 06/29/2022   No joint exam has been documented for this visit   There is currently no information documented on the homunculus. Go to the Rheumatology activity and complete the homunculus joint exam.  Investigation: No additional findings.  Imaging: No results found.  Recent Labs: Lab Results  Component Value Date   WBC 6.7 07/26/2021   HGB 11.9 (L) 07/26/2021   PLT 391.0 07/26/2021   NA 143 04/26/2022   K 3.3 (L) 04/26/2022   CL 101 04/26/2022   CO2 29 04/26/2022   GLUCOSE 86 04/26/2022   BUN 14 04/26/2022   CREATININE 0.72 04/26/2022   BILITOT 0.4 04/26/2022   ALKPHOS 53 04/26/2022   AST 15 04/26/2022   ALT 13 04/26/2022   PROT 7.3 04/26/2022   ALBUMIN 4.3 04/26/2022   CALCIUM 9.5 04/26/2022   GFRAA >60 05/09/2019    Speciality Comments: No specialty comments available.  Procedures:  No procedures performed Allergies: Compazine [prochlorperazine edisylate], Hydrocodone-acetaminophen, Latex, and Tramadol hcl    Assessment / Plan:     Visit Diagnoses: No diagnosis found.  Orders: No orders of the defined types were placed in this encounter.  No orders of the defined types were placed in this encounter.   Face-to-face time spent with patient was *** minutes. Greater than 50% of time was spent in counseling and coordination of care.  Follow-Up Instructions: No follow-ups on file.   Earnestine Mealing, CMA  Note - This record has been created using Editor, commissioning.  Chart creation errors have been sought, but may not always  have been located. Such creation errors do not reflect on  the standard of medical care.

## 2022-06-29 ENCOUNTER — Ambulatory Visit: Payer: BC Managed Care – PPO | Attending: Physician Assistant | Admitting: Physician Assistant

## 2022-06-29 DIAGNOSIS — M5030 Other cervical disc degeneration, unspecified cervical region: Secondary | ICD-10-CM

## 2022-06-29 DIAGNOSIS — F32A Depression, unspecified: Secondary | ICD-10-CM

## 2022-06-29 DIAGNOSIS — I10 Essential (primary) hypertension: Secondary | ICD-10-CM

## 2022-06-29 DIAGNOSIS — R700 Elevated erythrocyte sedimentation rate: Secondary | ICD-10-CM

## 2022-06-29 DIAGNOSIS — E559 Vitamin D deficiency, unspecified: Secondary | ICD-10-CM

## 2022-06-29 DIAGNOSIS — C50412 Malignant neoplasm of upper-outer quadrant of left female breast: Secondary | ICD-10-CM

## 2022-06-29 DIAGNOSIS — M19072 Primary osteoarthritis, left ankle and foot: Secondary | ICD-10-CM

## 2022-06-29 DIAGNOSIS — I671 Cerebral aneurysm, nonruptured: Secondary | ICD-10-CM

## 2022-06-29 DIAGNOSIS — Z8639 Personal history of other endocrine, nutritional and metabolic disease: Secondary | ICD-10-CM

## 2022-06-29 DIAGNOSIS — Z96653 Presence of artificial knee joint, bilateral: Secondary | ICD-10-CM

## 2022-06-29 DIAGNOSIS — M19041 Primary osteoarthritis, right hand: Secondary | ICD-10-CM

## 2022-06-29 DIAGNOSIS — G8929 Other chronic pain: Secondary | ICD-10-CM

## 2022-06-29 DIAGNOSIS — F5101 Primary insomnia: Secondary | ICD-10-CM

## 2022-07-01 ENCOUNTER — Inpatient Hospital Stay: Payer: BC Managed Care – PPO | Attending: Hematology and Oncology | Admitting: Hematology and Oncology

## 2022-07-01 NOTE — Assessment & Plan Note (Deleted)
08/21/2018:Bilateral mastectomies: Left mastectomy: IDC grade 3, 2.1 cm, high-grade DCIS, margins negative, negative for LV I, 3/9 lymph nodes positive, ER 95% positive, PR 90% positive, HER-2 -1+, Ki-67 30% T2N1A stage IIa right mastectomy: Benign MammaPrint: High risk luminal type B Echocardiogram 09/18/2018: EF 55 to 60% Patient has been enrolled and upbeat clinical trial: No adverse effects related to the trial  Treatment plan: 1.Adjuvant chemotherapy with dose dense Adriamycin and Cytoxan x4 followed by weekly Taxol x1discontinued due to intolerance 2.Followed by adjuvantrad[facility] 3.Followed by adjuvant antiestrogen therapystarted 05/03/2019 ------------------------------------------------------------------------------------------------------------------ Current Treatment:Anastrozole 1 mg daily started 8/14/2020discontinued soon after because of nausea Currently on letrozole  Left lower chest wall discomfort: It is tender to touch I suspect may be costochondritis or intercostal myalgia.   CT chest 05/13/2021: No evidence of metastatic disease in the chest I recommended starting her on gabapentin 300 mg nightly.  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  She is excited about this new job which will offer her opportunity to exercise.  Breast cancer surveillance: 1.Breast exams 07/01/22: Bilateral mastectomy scars tenderness in the left lower chest wall 2.No role of routine imaging studies because she had bilateral mastectomies   Return to clinic in 1 year for follow-up

## 2022-07-05 DIAGNOSIS — R519 Headache, unspecified: Secondary | ICD-10-CM | POA: Diagnosis not present

## 2022-07-05 DIAGNOSIS — S0083XA Contusion of other part of head, initial encounter: Secondary | ICD-10-CM | POA: Diagnosis not present

## 2022-07-05 DIAGNOSIS — W1839XA Other fall on same level, initial encounter: Secondary | ICD-10-CM | POA: Diagnosis not present

## 2022-07-05 DIAGNOSIS — Y999 Unspecified external cause status: Secondary | ICD-10-CM | POA: Diagnosis not present

## 2022-07-06 DIAGNOSIS — R55 Syncope and collapse: Secondary | ICD-10-CM | POA: Diagnosis not present

## 2022-07-14 ENCOUNTER — Inpatient Hospital Stay: Payer: BC Managed Care – PPO | Admitting: Hematology and Oncology

## 2022-07-14 NOTE — Assessment & Plan Note (Deleted)
08/21/2018:Bilateral mastectomies: Left mastectomy: IDC grade 3, 2.1 cm, high-grade DCIS, margins negative, negative for LV I, 3/9 lymph nodes positive, ER 95% positive, PR 90% positive, HER-2 -1+, Ki-67 30% T2N1A stage IIa right mastectomy: Benign MammaPrint: High risk luminal type B Echocardiogram 09/18/2018: EF 55 to 60% Patient has been enrolled and upbeat clinical trial: No adverse effects related to the trial  Treatment plan: 1.Adjuvant chemotherapy with dose dense Adriamycin and Cytoxan x4 followed by weekly Taxol x1discontinued due to intolerance 2.Followed by adjuvantrad[facility] 3.Followed by adjuvant antiestrogen therapystarted 05/03/2019 ------------------------------------------------------------------------------------------------------------------ Current Treatment:Anastrozole 1 mg daily started 8/14/2020discontinued soon after because of nausea Currently on letrozole  Left lower chest wall discomfort: It is tender to touch I suspect may be costochondritis or intercostal myalgia.   CT chest 05/13/2021: No evidence of metastatic disease in the chest I recommended starting her on gabapentin 3 mg nightly.  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  She is excited about this new job which will offer her opportunity to exercise.   Breast cancer surveillance: 1.Breast exams 07/14/2022: Bilateral mastectomy scars tenderness in the left lower chest wall 2.No role of routine imaging studies because she had bilateral mastectomies   Return to clinic in 1 year for follow-up

## 2022-07-14 NOTE — Progress Notes (Incomplete)
Patient Care Team: Karie Georges, MD as PCP - General (Family Medicine) Stacey Drain, MD as Consulting Physician (Rheumatology) Durene Romans, MD as Consulting Physician (Orthopedic Surgery) Harriette Bouillon, MD as Consulting Physician (General Surgery) Serena Croissant, MD as Consulting Physician (Hematology and Oncology)  DIAGNOSIS:  Encounter Diagnosis  Name Primary?   Malignant neoplasm of upper-outer quadrant of left breast in female, estrogen receptor positive (HCC)     SUMMARY OF ONCOLOGIC HISTORY: Oncology History  Malignant neoplasm of upper-outer quadrant of left breast in female, estrogen receptor positive (HCC)  07/13/2018 Initial Diagnosis    Palpable left breast mass, 2 adjacent masses at 2 o'clock position 2 cm biopsy IDC grade 2-3, Ig DCIS, ER 95%, PR 90%, Ki-67 30%, HER-2 -1+; 2:30 position intramammary lymph node 1.3 cm biopsy positive; left axillary lymph node biopsy negative discordant, T2N1, stage IIa   07/25/2018 Cancer Staging   Staging form: Breast, AJCC 8th Edition - Clinical: Stage IIA (cT1c, cN1, cM0, G3, ER+, PR+, HER2-) - Signed by Serena Croissant, MD on 07/25/2018   08/08/2018 Genetic Testing   Genetic testing performed through [employer] Cancernext + RNA insight panel reported out on 08/08/2018 showed no pathogenic mutations. The CancerNext + RNA insight gene panel offered by [employer] includes sequencing and rearrangement analysis for the following 34 genes:   APC*, ATM*, BARD1, BMPR1A, BRCA1*, BRCA2*, BRIP1*, CDH1*, CDK4, CDKN2A, CHEK2*, DICER1,HOXB13, MLH1*, MRE11A, MSH2*, MSH6*, MUTYH*, NBN, NF1*, PALB2*, PMS2*, POLD1, POLE, PTEN*, RAD50, RAD51C*, RAD51D*, SMAD4, SMARCA4, STK11 and TP53* (sequencing and deletion/duplication); EPCAM and GREM1 (deletion/duplication only). DNA and RNA analyses performed for * genes   08/21/2018 Surgery   Bilateral mastectomies: Left mastectomy: IDC grade 3, 2.1 cm, high-grade DCIS, margins negative, negative for LV I,  3/9 lymph nodes positive, ER 95% positive, PR 90% positive, HER-2 -1+, Ki-67 30% T2N1A stage IIa right mastectomy: Benign   09/03/2018 Cancer Staging   Staging form: Breast, AJCC 8th Edition - Pathologic: Stage IIA (pT2, pN1a, cM0, G3, ER+, PR+, HER2-) - Signed by Serena Croissant, MD on 09/03/2018   09/03/2018 Miscellaneous   MammaPrint: High risk luminal type B, average 10-year risk of recurrence untreated 29%, predicted 5-year benefit of chemotherapy 94.6% distant metastasis free interval   09/26/2018 - 12/04/2018 Chemotherapy   Adjuvant chemotherapy with dose dense Adriamycin and Cytoxan x4 followed by weekly Taxol x1   03/19/2019 - 04/02/2019 Radiation Therapy   Adjuvant XRT at [facility] "Between 03/19/2019 and 04/02/2019 the patient received 5800 cGy in 29 fractions (of a planned 6000 cGy in 30 fractions) to the left chest wall mastectomy scar region. Treatment initially encompassed the left chest wall and left supraclavicular/axillary region delivering 5000 cGy in 25 fractions. The left chest wall was treated using opposed tangential field in field approach with a combination of 6 MV and 23 MV photons. The left supraclavicular/axillary region was treated using a RAO/PA technique with 23 MV photons. This was followed by a boost to the left chest wall mastectomy scar region delivering 800 cGy in 4 fractions (of a planned 1000 cGy in 5 fractions) using an en face technique with a 9 MeV electron beam. The treatment course was completed over 42 elapsed days."   04/2019 -  Anti-estrogen oral therapy   Anastrozole daily, discontinued soon after starting because of nausea, switched to letrozole 03/19/20     CHIEF COMPLIANT: Follow-up of left breast cancer  INTERVAL HISTORY: Paula Jacobs is a 61 y.o. with above-mentioned history of left breast cancer treated with bilateral  mastectomies, adjuvant chemotherapy, radiation, and is currently on antiestrogen therapy with anastrozole.    ALLERGIES:  is  allergic to compazine [prochlorperazine edisylate], hydrocodone-acetaminophen, latex, and tramadol hcl.  MEDICATIONS:  Current Outpatient Medications  Medication Sig Dispense Refill   acetaminophen (TYLENOL) 500 MG tablet Take 1,000 mg by mouth every 6 (six) hours as needed for moderate pain.     benazepril-hydrochlorthiazide (LOTENSIN HCT) 10-12.5 MG tablet Take 1 tablet by mouth daily. 90 tablet 1   buPROPion (WELLBUTRIN XL) 300 MG 24 hr tablet Take 1 tablet (300 mg total) by mouth daily. 90 tablet 1   diphenhydrAMINE (BENADRYL) 25 MG tablet Take 25-50 mg by mouth every 6 (six) hours as needed for allergies.     ibuprofen (ADVIL) 800 MG tablet Take 1 tablet (800 mg total) by mouth every 8 (eight) hours as needed. 30 tablet 0   letrozole (FEMARA) 2.5 MG tablet TAKE 1 TABLET(2.5 MG) BY MOUTH DAILY 90 tablet 3   SYNTHROID 175 MCG tablet Take 1 tablet (175 mcg total) by mouth daily before breakfast. 90 tablet 1   traZODone (DESYREL) 50 MG tablet Take 0.5-1 tablets (25-50 mg total) by mouth at bedtime as needed for sleep. 90 tablet 1   Vitamin D, Ergocalciferol, (DRISDOL) 1.25 MG (50000 UNIT) CAPS capsule Take 1 capsule (50,000 Units total) by mouth every 7 (seven) days. 12 capsule 1   No current facility-administered medications for this visit.   Facility-Administered Medications Ordered in Other Visits  Medication Dose Route Frequency Provider Last Rate Last Admin   sodium chloride flush (NS) 0.9 % injection 10 mL  10 mL Intracatheter PRN Nicholas Lose, MD        PHYSICAL EXAMINATION: ECOG PERFORMANCE STATUS: {CHL ONC ECOG FU:[id number]}  There were no vitals filed for this visit. There were no vitals filed for this visit.  BREAST:*** No palpable masses or nodules in either right or left breasts. No palpable axillary supraclavicular or infraclavicular adenopathy no breast tenderness or nipple discharge. (exam performed in the presence of a chaperone)  LABORATORY DATA:  I have reviewed  the data as listed    Latest Ref Rng & Units 04/26/2022    9:46 AM 10/07/2021    8:59 AM 07/26/2021   10:18 AM  CMP  Glucose 70 - 99 mg/dL 86   92   BUN 6 - 23 mg/dL 14   21   Creatinine 0.40 - 1.20 mg/dL 0.72   0.64   Sodium 135 - 145 mEq/L 143   138   Potassium 3.5 - 5.1 mEq/L 3.3   3.7   Chloride 96 - 112 mEq/L 101   101   CO2 19 - 32 mEq/L 29   30   Calcium 8.4 - 10.5 mg/dL 9.5   9.4   Total Protein 6.0 - 8.3 g/dL 7.3  6.6  7.2   Total Bilirubin 0.2 - 1.2 mg/dL 0.4   0.5   Alkaline Phos 39 - 117 U/L 53   63   AST 0 - 37 U/L 15   15   ALT 0 - 35 U/L 13   14     Lab Results  Component Value Date   WBC 6.7 07/26/2021   HGB 11.9 (L) 07/26/2021   HCT 37.1 07/26/2021   MCV 84.5 07/26/2021   PLT 391.0 07/26/2021   NEUTROABS 4.6 07/26/2021    ASSESSMENT & PLAN:  No problem-specific Assessment & Plan notes found for this encounter.    No orders of the defined  types were placed in this encounter.  The patient has a good understanding of the overall plan. she agrees with it. she will call with any problems that may develop before the next visit here. Total time spent: 30 mins including face to face time and time spent for planning, charting and co-ordination of care   Suzzette Righter,  07/14/22    I Gardiner Coins am scribing for Dr. Lindi Adie  ***

## 2022-07-26 ENCOUNTER — Telehealth (INDEPENDENT_AMBULATORY_CARE_PROVIDER_SITE_OTHER): Payer: BC Managed Care – PPO | Admitting: Family Medicine

## 2022-07-26 VITALS — Ht 62.0 in

## 2022-07-26 DIAGNOSIS — U071 COVID-19: Secondary | ICD-10-CM | POA: Diagnosis not present

## 2022-07-26 DIAGNOSIS — F321 Major depressive disorder, single episode, moderate: Secondary | ICD-10-CM

## 2022-07-26 MED ORDER — NIRMATRELVIR/RITONAVIR (PAXLOVID)TABLET: 3.0000 | ORAL_TABLET | Freq: Two times a day (BID) | ORAL | 0 refills | Status: AC

## 2022-07-26 MED ORDER — BUPROPION HCL ER (XL) 150 MG PO TB24: 150.0000 mg | ORAL_TABLET | Freq: Every day | ORAL | 0 refills | Status: DC

## 2022-07-26 MED ORDER — BENZONATATE 100 MG PO CAPS: ORAL_CAPSULE | ORAL | 0 refills | Status: DC

## 2022-07-26 NOTE — Patient Instructions (Signed)
HOME CARE TIPS:   -I sent the medication(s) we discussed to your pharmacy: Meds ordered this encounter  Medications   nirmatrelvir/ritonavir EUA (PAXLOVID) 20 x 150 MG & 10 x '100MG'$  TABS    Sig: Take 3 tablets by mouth 2 (two) times daily for 5 days. (Take nirmatrelvir 150 mg two tablets twice daily for 5 days and ritonavir 100 mg one tablet twice daily for 5 days) Patient GFR is > 80 last check    Dispense:  30 tablet    Refill:  0   buPROPion (WELLBUTRIN XL) 150 MG 24 hr tablet    Sig: Take 1 tablet (150 mg total) by mouth daily.    Dispense:  90 tablet    Refill:  0   benzonatate (TESSALON PERLES) 100 MG capsule    Sig: 1-2 capsules up to twice daily as needed for cough    Dispense:  30 capsule    Refill:  0     -I sent in the [place] treatment or referral you requested per our discussion. Please see the information provided below and discuss further with the pharmacist/treatment team.  -If taking Paxlovid, please review all medications, supplement and over the counter drugs with your pharmacist and ask them to check for any interactions.   -there is a chance of rebound illness with covid after improving. This can happen whether or not you take an antiviral treatment. If you become sick again with covid after getting better, please schedule a follow up virtual visit and isolate again.  -can use tylenol  if needed for fevers, aches and pains per instructions  -nasal saline sinus rinses twice daily  -stay hydrated, drink plenty of fluids and eat small healthy meals - avoid dairy  -can take 1000 IU (4mg) Vit D3 and 100-500 mg of Vit C daily per instructions  -follow up with your doctor in 2-3 days unless improving and feeling better  -stay home while sick, except to seek medical care. If you have COVID19, you will likely be contagious for 7-10 days. Flu or Influenza is likely contagious for about 7 days. Other respiratory viral infections remain contagious for 5-10+ days  depending on the virus and many other factors. Wear a good mask that fits snugly (such as N95 or KN95) if around others to reduce the risk of transmission.  It was nice to meet you today, and I really hope you are feeling better soon. I help [facility] out with telemedicine visits on Tuesdays and Thursdays and am happy to help if you need a follow up virtual visit on those days. Otherwise, if you have any concerns or questions following this visit please schedule a follow up visit with your Primary Care doctor or seek care at a local urgent care clinic to avoid delays in care.    Seek in person care or schedule a follow up video visit promptly if your symptoms worsen, new concerns arise or you are not improving with treatment. Call 911 and/or seek emergency care if your symptoms are severe or life threatening.   See the following link for the most recent information regarding Paxlovid:  [email or web address]   Nirmatrelvir; Ritonavir Tablets What is this medication? NIRMATRELVIR; RITONAVIR (NIR ma TREL vir; ri TOE na veer) treats mild to moderate COVID-19. It may help people who are at high risk of developing severe illness. This medication works by limiting the spread of the virus in your body. The FDA has allowed the emergency use of this medication. This medicine  may be used for other purposes; ask your health care provider or pharmacist if you have questions. COMMON BRAND NAME(S): PAXLOVID What should I tell my care team before I take this medication? They need to know if you have any of these conditions: Any allergies Any serious illness Kidney disease Liver disease An unusual or allergic reaction to nirmatrelvir, ritonavir, other medications, foods, dyes, or preservatives Pregnant or trying to get pregnant Breast-feeding How should I use this medication? This product contains 2 different medications that are packaged together. For the standard dose, take 2 pink tablets of nirmatrelvir  with 1 white tablet of ritonavir (3 tablets total) by mouth with water twice daily. Talk to your care team if you have kidney disease. You may need a different dose. Swallow the tablets whole. You can take it with or without food. If it upsets your stomach, take it with food. Take all of this medication unless your care team tells you to stop it early. Keep taking it even if you think you are better. Talk to your care team about the use of this medication in children. While it may be prescribed for children as young as 12 years for selected conditions, precautions do apply. Overdosage: If you think you have taken too much of this medicine contact a poison control center or emergency room at once. NOTE: This medicine is only for you. Do not share this medicine with others. What if I miss a dose? If you miss a dose, take it as soon as you can unless it is more than 8 hours late. If it is more than 8 hours late, skip the missed dose. Take the next dose at the normal time. Do not take extra or 2 doses at the same time to make up for the missed dose. What may interact with this medication? Do not take this medication with any of the following medications: Alfuzosin Certain medications for anxiety or sleep like midazolam, triazolam Certain medications for cancer like apalutamide, enzalutamide Certain medications for cholesterol like lovastatin, simvastatin Certain medications for irregular heart beat like amiodarone, dronedarone, flecainide, propafenone, quinidine Certain medications for pain like meperidine, piroxicam Certain medications for psychotic disorders like clozapine, lurasidone, pimozide Certain medications for seizures like carbamazepine, phenobarbital, phenytoin Colchicine Eletriptan Eplerenone Ergot alkaloids like dihydroergotamine, ergonovine, ergotamine, methylergonovine Finerenone Flibanserin Ivabradine Lomitapide Naloxegol Ranolazine Rifampin Sildenafil Silodosin [facility]'s  Wort Tolvaptan Ubrogepant Voclosporin This medication may also interact with the following medications: Bedaquiline Birth control pills Bosentan Certain antibiotics like erythromycin or clarithromycin Certain medications for blood pressure like amlodipine, diltiazem, felodipine, nicardipine, nifedipine Certain medications for cancer like abemaciclib, ceritinib, dasatinib, encorafenib, ibrutinib, ivosidenib, neratinib, nilotinib, venetoclax, vinblastine, vincristine Certain medications for cholesterol like atorvastatin, rosuvastatin Certain medications for depression like bupropion, trazodone Certain medications for fungal infections like isavuconazonium, itraconazole, ketoconazole, voriconazole Certain medications for hepatitis C like elbasvir; grazoprevir, dasabuvir; ombitasvir; paritaprevir; ritonavir, glecaprevir; pibrentasvir, sofosbuvir; velpatasvir; voxilaprevir Certain medications for HIV or AIDS Certain medications for irregular heartbeat like lidocaine Certain medications that treat or prevent blood clots like rivaroxaban, warfarin Digoxin Fentanyl Medications that lower your chance of fighting infection like cyclosporine, sirolimus, tacrolimus Methadone Quetiapine Rifabutin Salmeterol Steroid medications like betamethasone, budesonide, ciclesonide, dexamethasone, fluticasone, methylprednisone, mometasone, triamcinolone This list may not describe all possible interactions. Give your health care provider a list of all the medicines, herbs, non-prescription drugs, or dietary supplements you use. Also tell them if you smoke, drink alcohol, or use illegal drugs. Some items may interact with your medicine. What  should I watch for while using this medication? Your condition will be monitored carefully while you are receiving this medication. Visit your care team for regular checkups. Tell your care team if your symptoms do not start to get better or if they get worse. If you have  untreated HIV infection, this medication may lead to some HIV medications not working as well in the future. Birth control may not work properly while you are taking this medication. Talk to your care team about using an extra method of birth control. What side effects may I notice from receiving this medication? Side effects that you should report to your care team as soon as possible: Allergic reactions--skin rash, itching, hives, swelling of the face, lips, tongue, or throat Liver injury--right upper belly pain, loss of appetite, nausea, light-colored stool, dark yellow or brown urine, yellowing skin or eyes, unusual weakness or fatigue Redness, blistering, peeling, or loosening of the skin, including inside the mouth Side effects that usually do not require medical attention (report these to your care team if they continue or are bothersome): Change in taste Diarrhea General discomfort and fatigue Increase in blood pressure Muscle pain Nausea Stomach pain This list may not describe all possible side effects. Call your doctor for medical advice about side effects. You may report side effects to FDA at 1-800-FDA-1088. Where should I keep my medication? Keep out of the reach of children and pets. Store at room temperature between 20 and 25 degrees C (68 and 77 degrees F). Get rid of any unused medication after the expiration date. To get rid of medications that are no longer needed or have expired: Take the medication to a medication take-back program. Check with your pharmacy or law enforcement to find a location. If you cannot return the medication, check the label or package insert to see if the medication should be thrown out in the garbage or flushed down the toilet. If you are not sure, ask your care team. If it is safe to put it in the trash, take the medication out of the container. Mix the medication with cat litter, dirt, coffee grounds, or other unwanted substance. Seal the mixture  in a bag or container. Put it in the trash. NOTE: This sheet is a summary. It may not cover all possible information. If you have questions about this medicine, talk to your doctor, pharmacist, or health care provider.  2022 Elsevier/Gold Standard (2021-06-07 00:00:00)

## 2022-07-26 NOTE — Progress Notes (Signed)
Virtual Visit via Video Note  I connected with Remedy  on 07/26/22 at  3:00 PM EST by a video enabled telemedicine application and verified that I am speaking with the correct person using two identifiers.  Location patient: [place] Location provider:work or home office Persons participating in the virtual visit: patient, provider  I discussed the limitations and requested verbal permission for telemedicine visit. The patient expressed understanding and agreed to proceed.   HPI:  Acute telemedicine visit for Covid19: -Onset: 2 days ago, covid test positive -Symptoms include: sinus congestion, cough, headache, fever, body aches, emesis x 1 -Denies: diarrhea, CP, SOB, inability to tol oral intake -Has tried:tylenol, musinex -Pertinent past medical history: see below, has had covid twice before, GFR 91 in august -Pertinent medication allergies: Allergies  Allergen Reactions   Compazine [Prochlorperazine Edisylate] Anxiety   Hydrocodone-Acetaminophen Itching   Latex Itching   Tramadol Hcl Itching and Nausea Only  -COVID-19 vaccine status: only had two doses a few years ago Immunization History  Administered Date(s) Administered   Influenza,inj,Quad PF,6+ Mos 06/02/2015, 10/12/2017   Influenza,inj,quad, With Preservative 10/12/2017   PFIZER(Purple Top)SARS-COV-2 Vaccination 11/21/2019, 12/18/2019   PPD Test 07/23/2015, 08/06/2015   Td 03/10/2010   Tdap 07/26/2021   Zoster Recombinat (Shingrix) 04/26/2022    Depression: -take Wellbutrin '150mg'$  XL daily, recently her PCP upped the dose to '300mg'$  qd but she did not tolerate it  - it upset her stomach -she just ran out of the '150mg'$  dose and the new rx is for '300mg'$  -reports her mood is doing fine and is request rx back to the '150mg'$   ROS: See pertinent positives and negatives per HPI.  Past Medical History:  Diagnosis Date   ANEMIA-NOS 04/23/2008   Aneurysm ([place]) 10/19/2017   Evaluated by MRA 10/2017, 29m, stable   Anginal pain ([facility]     went to ED in april 2018 c/o chest pain over last 2 months ; had EKG, CXR  and troponin negative per physician suspected musculoskeletal  ; dc'd with ibuprofen  and recc f/u with outpatient stress test; see care everywhere ED visit  ; patient denies recurrence of Chest pain since, endorses occ palpitations    Anxiety    [place]   left breast ca- had bil mast   DEPRESSION 04/23/2008   DJD (degenerative joint disease)    Family history of breast cancer    Hyperplastic colon polyp    x2   Hypertension    HYPOTHYROIDISM 10/17/2007   Osteoarthritis    Palpitations    freq at night;  at pre-op states she drinks caffeine and that makes it worse; says the palpitations started after they took her thyroid    PAT 10/27/2009   Qualifier: Diagnosis of  By: MAundra Dubin MD, [name]     Pneumonia    "in the past, havent had it in years"   PONV (postoperative nausea and vomiting)    only after 1979 surgery   RA (rheumatoid arthritis) ([facility]    "problems in feet, hands, and knees"   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/05/2017   SVT (supraventricular tachycardia) ([place]    chronic    Tubulovillous adenoma of colon     Past Surgical History:  Procedure Laterality Date   ABDOMINAL HYSTERECTOMY     APPENDECTOMY     BUNIONECTOMY  10/2009 right & 02/03/10 left   COLONOSCOPY  10/22/2021   with biopsy   COLONOSCOPY W/ POLYPECTOMY  09/19/2013   HAMMER TOE  SURGERY     "all toes have pins, done with bunionectomy"   INGUINAL HERNIA REPAIR     KNEE CLOSED REDUCTION Left 12/05/2017   Procedure: CLOSED MANIPULATION LEFT KNEE;  Surgeon: [name], MD;  [facility];  Service: Orthopedics;  Laterality: Left;   MASTECTOMY WITH RADIOACTIVE SEED GUIDED EXCISION AND AXILLARY SENTINEL LYMPH NODE BIOPSY Bilateral 08/21/2018   Procedure: BILATERAL SIMPLE MASTECTOMIES WITH LEFT AXILLARY RADIOACTIVE SEED GUIDED LYMPH NODE EXCISION AND LEFT AXILLARY SENTINEL LYMPH NODE BIOPSY;  Surgeon: Erroll Luna, MD;   Location: [facility];  Service: General;  Laterality: Bilateral;   neck fusion     x3   PORT-A-CATH REMOVAL Right 05/15/2019   Procedure: REMOVAL PORT-A-CATH;  Surgeon: Erroll Luna, MD;  Location: [facility];  Service: General;  Laterality: Right;   PORTACATH PLACEMENT Right 09/25/2018   Procedure: INSERTION PORT-A-CATH WITH ULTRASOUND ERAS PATHWAY;  Surgeon: Erroll Luna, MD;  Location: [facility];  Service: General;  Laterality: Right;   THYROIDECTOMY  09/20/1999   for nodules   TOE AMPUTATION  09/19/1977   6th toe removed from each foot   TOTAL KNEE ARTHROPLASTY Left 10/21/2014   Procedure: LEFT TOTAL KNEE ARTHROPLASTY;  Surgeon: [name], MD;  [facility];  Service: Orthopedics;  Laterality: Left;   TOTAL KNEE ARTHROPLASTY Right 12/05/2017   Procedure: RIGHT TOTAL KNEE ARTHROPLASTY;  Surgeon: [name], MD;  [facility];  Service: Orthopedics;  Laterality: Right;  70 mins     Current Outpatient Medications:    acetaminophen (TYLENOL) 500 MG tablet, Take 1,000 mg by mouth every 6 (six) hours as needed for moderate pain., Disp: , Rfl:    benazepril-hydrochlorthiazide (LOTENSIN HCT) 10-12.5 MG tablet, Take 1 tablet by mouth daily., Disp: 90 tablet, Rfl: 1   benzonatate (TESSALON PERLES) 100 MG capsule, 1-2 capsules up to twice daily as needed for cough, Disp: 30 capsule, Rfl: 0   buPROPion (WELLBUTRIN XL) 150 MG 24 hr tablet, Take 1 tablet (150 mg total) by mouth daily., Disp: 90 tablet, Rfl: 0   diphenhydrAMINE (BENADRYL) 25 MG tablet, Take 25-50 mg by mouth every 6 (six) hours as needed for allergies., Disp: , Rfl:    ibuprofen (ADVIL) 800 MG tablet, Take 1 tablet (800 mg total) by mouth every 8 (eight) hours as needed., Disp: 30 tablet, Rfl: 0   letrozole (FEMARA) 2.5 MG tablet, TAKE 1 TABLET(2.5 MG) BY MOUTH DAILY, Disp: 90 tablet, Rfl: 3   nirmatrelvir/ritonavir EUA (PAXLOVID) 20 x 150 MG & 10 x '100MG'$  TABS, Take 3 tablets by mouth 2  (two) times daily for 5 days. (Take nirmatrelvir 150 mg two tablets twice daily for 5 days and ritonavir 100 mg one tablet twice daily for 5 days) Patient GFR is > 80 last check, Disp: 30 tablet, Rfl: 0   SYNTHROID 175 MCG tablet, Take 1 tablet (175 mcg total) by mouth daily before breakfast., Disp: 90 tablet, Rfl: 1   traZODone (DESYREL) 50 MG tablet, Take 0.5-1 tablets (25-50 mg total) by mouth at bedtime as needed for sleep., Disp: 90 tablet, Rfl: 1   Vitamin D, Ergocalciferol, (DRISDOL) 1.25 MG (50000 UNIT) CAPS capsule, Take 1 capsule (50,000 Units total) by mouth every 7 (seven) days., Disp: 12 capsule, Rfl: 1 No current facility-administered medications for this visit.  Facility-Administered Medications Ordered in Other Visits:    sodium chloride flush (NS) 0.9 % injection 10 mL, 10 mL, Intracatheter, PRN, Nicholas Lose, MD  EXAM:  VITALS per  patient if applicable:  GENERAL: alert, oriented, appears well and in no acute distress  HEENT: atraumatic, conjunttiva clear, no obvious abnormalities on inspection of external nose and ears  NECK: normal movements of the head and neck  LUNGS: on inspection no signs of respiratory distress, breathing rate appears normal, no obvious gross SOB, gasping or wheezing  CV: no obvious cyanosis  MS: moves all visible extremities without noticeable abnormality  PSYCH/NEURO: pleasant and cooperative, no obvious depression or anxiety, speech and thought processing grossly intact  ASSESSMENT AND PLAN:  Discussed the following assessment and plan:  COVID-19  Discussed treatment options, side effect and risk of drug interactions, ideal treatment window, potential complications, isolation and precautions for COVID-19.  Discussed possibility of rebound with or without antivirals. Checked for/reviewed last GFR - listed in HPI if available.  After lengthy discussion, the patient opted for treatment with Paxlovid due to being higher risk for complications  of covid or severe disease and other factors. Discussed EUA status of this drug and the fact that there is preliminary limited knowledge of risks/interactions/side effects per EUA document vs possible benefits and precautions. This information was shared with patient during the visit and also was provided in patient instructions. She plans to hold her trazadone as reports does not take on a regular basis anyway. Also, advised that patient discuss risks/interactions and use with pharmacist/treatment team as well. The patient did want a prescription for cough, Tessalon Rx sent.  Other symptomatic care measures summarized in patient instructions.  Depression, major, single episode, moderate (HCC) -discontinued the '300mg'$  dose of the Wellbutrin per her request -sent the '150mg'$  dose -advised of interaction with Paxlovid    Work/School slipped offered: declined Advised to seek prompt virtual visit or in person care if worsening, new symptoms arise, or if is not improving with treatment as expected per our conversation of expected course. Discussed options for follow up care. Did let this patient know that I do telemedicine on Tuesdays and Thursdays for [place] and those are the days I am logged into the system. Advised to schedule follow up visit with PCP, [place] virtual visits or [facility] if any further questions or concerns to avoid delays in care.   I discussed the assessment and treatment plan with the patient. The patient was provided an opportunity to ask questions and all were answered. The patient agreed with the plan and demonstrated an understanding of the instructions.     [name], DO

## 2022-07-27 ENCOUNTER — Ambulatory Visit: Payer: BC Managed Care – PPO | Admitting: Family Medicine

## 2022-08-04 ENCOUNTER — Ambulatory Visit: Payer: BC Managed Care – PPO | Admitting: Family Medicine

## 2022-09-01 ENCOUNTER — Ambulatory Visit: Payer: BC Managed Care – PPO | Admitting: Family Medicine

## 2022-09-13 ENCOUNTER — Ambulatory Visit: Payer: BC Managed Care – PPO | Admitting: Family Medicine

## 2022-09-20 ENCOUNTER — Other Ambulatory Visit: Payer: Self-pay | Admitting: Family Medicine

## 2022-09-20 DIAGNOSIS — I10 Essential (primary) hypertension: Secondary | ICD-10-CM

## 2022-09-20 DIAGNOSIS — E039 Hypothyroidism, unspecified: Secondary | ICD-10-CM

## 2022-09-27 NOTE — Progress Notes (Deleted)
Office Visit Note  Patient: Paula Jacobs             Date of Birth: 30-Sep-1960           MRN: [id number]             PCP: Farrel Conners, MD Referring: Farrel Conners, MD Visit Date: 10/11/2022 Occupation: @GUAROCC$ @  Subjective:  No chief complaint on file.   History of Present Illness: Paula Jacobs is a 62 y.o. female ***     Activities of Daily Living:  Patient reports morning stiffness for *** {minute/hour:19697}.   Patient {ACTIONS;DENIES/REPORTS:21021675::"Denies"} nocturnal pain.  Difficulty dressing/grooming: {ACTIONS;DENIES/REPORTS:21021675::"Denies"} Difficulty climbing stairs: {ACTIONS;DENIES/REPORTS:21021675::"Denies"} Difficulty getting out of chair: {ACTIONS;DENIES/REPORTS:21021675::"Denies"} Difficulty using hands for taps, buttons, cutlery, and/or writing: {ACTIONS;DENIES/REPORTS:21021675::"Denies"}  No Rheumatology ROS completed.   PMFS History:  Patient Active Problem List   Diagnosis Date Noted   Primary osteoarthritis of both hands 12/23/2021   Primary osteoarthritis of both feet 12/23/2021   DDD (degenerative disc disease), cervical 12/23/2021   Port-A-Cath in place 09/26/2018   Breast cancer, left ([place]) 08/22/2018   Breast cancer, stage 2, left ([place]) 08/21/2018   Genetic testing 08/08/2018   Family history of breast cancer    Malignant neoplasm of upper-outer quadrant of left breast in female, estrogen receptor positive ([place]) 07/19/2018   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/05/2017   Aneurysm ([place]) 10/19/2017   Morbid obesity ([place]) 10/22/2014   Hypertension 01/31/2011   Hypothyroidism 10/17/2007    Past Medical History:  Diagnosis Date   ANEMIA-NOS 04/23/2008   Aneurysm ([place]) 10/19/2017   Evaluated by MRA 10/2017, 59m, stable   [place]inal [place]    went to ED in april 2018 c/o chest pain over last 2 months ; had EKG, CXR  and troponin negative per physician suspected musculoskeletal  ; dc'd with ibuprofen  and recc f/u with outpatient stress  test; see care everywhere ED visit  ; patient denies recurrence of Chest pain since, endorses occ palpitations    Anxiety    Cancer ([place] 2019   left breast ca- had bil mast   DEPRESSION 04/23/2008   DJD (degenerative joint disease)    Family history of breast cancer    Hyperplastic colon polyp    x2   Hypertension    HYPOTHYROIDISM 10/17/2007   Osteoarthritis    Palpitations    freq at night;  at pre-op states she drinks caffeine and that makes it worse; says the palpitations started after they took her thyroid    PAT 10/27/2009   Qualifier: Diagnosis of  By: MAundra Dubin MD, [name]     Pneumonia    "in the past, havent had it in years"   PONV (postoperative nausea and vomiting)    only after 1979 surgery   RA (rheumatoid arthritis) ([place]    "problems in feet, hands, and knees"   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/05/2017   SVT (supraventricular tachycardia) (HCC)    chronic    Tubulovillous adenoma of colon     Family History  Problem Relation Age of Onset   Breast cancer Mother 46      metastatic to brain, died at 46  Dementia Father        died at 787  Cancer Sister    Schizophrenia Brother    Cancer Brother    COPD Maternal Aunt    Cancer Paternal Uncle        details unk  Heart attack Maternal Grandmother    Stroke Maternal Grandmother    Diabetes Maternal Grandmother    Heart disease Maternal Grandmother    Thyroid disease Maternal Grandmother    Esophageal cancer Neg Hx    Rectal cancer Neg Hx    Stomach cancer Neg Hx    Past Surgical History:  Procedure Laterality Date   ABDOMINAL HYSTERECTOMY     APPENDECTOMY     BUNIONECTOMY  10/2009 right & 02/03/10 left   COLONOSCOPY  10/22/2021   with biopsy   COLONOSCOPY W/ POLYPECTOMY  09/19/2013   HAMMER TOE SURGERY     "all toes have pins, done with bunionectomy"   INGUINAL HERNIA REPAIR     KNEE CLOSED REDUCTION Left 12/05/2017   Procedure: CLOSED MANIPULATION LEFT KNEE;  Surgeon: [name], MD;   [facility];  Service: Orthopedics;  Laterality: Left;   MASTECTOMY WITH RADIOACTIVE SEED GUIDED EXCISION AND AXILLARY SENTINEL LYMPH NODE BIOPSY Bilateral 08/21/2018   Procedure: BILATERAL SIMPLE MASTECTOMIES WITH LEFT AXILLARY RADIOACTIVE SEED GUIDED LYMPH NODE EXCISION AND LEFT AXILLARY SENTINEL LYMPH NODE BIOPSY;  Surgeon: Erroll Luna, MD;  Location: [facility];  Service: General;  Laterality: Bilateral;   neck fusion     x3   PORT-A-CATH REMOVAL Right 05/15/2019   Procedure: REMOVAL PORT-A-CATH;  Surgeon: Erroll Luna, MD;  Location: [facility];  Service: General;  Laterality: Right;   PORTACATH PLACEMENT Right 09/25/2018   Procedure: INSERTION PORT-A-CATH WITH ULTRASOUND ERAS PATHWAY;  Surgeon: Erroll Luna, MD;  Location: [facility];  Service: General;  Laterality: Right;   THYROIDECTOMY  09/20/1999   for nodules   TOE AMPUTATION  09/19/1977   6th toe removed from each foot   TOTAL KNEE ARTHROPLASTY Left 10/21/2014   Procedure: LEFT TOTAL KNEE ARTHROPLASTY;  Surgeon: [name], MD;  [facility];  Service: Orthopedics;  Laterality: Left;   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 Married - 2006  1 son '92  1 dtr '94. Work - at [employer].   Immunization History  Administered Date(s) Administered   Influenza,inj,Quad PF,6+ Mos 06/02/2015, 10/12/2017   Influenza,inj,quad, With Preservative 10/12/2017   PFIZER(Purple Top)SARS-COV-2 Vaccination 11/21/2019, 12/18/2019   PPD Test 07/23/2015, 08/06/2015   Td 03/10/2010   Tdap 07/26/2021   Zoster Recombinat (Shingrix) 04/26/2022     Objective: Vital Signs: There were no vitals taken for this visit.   Physical Exam   Musculoskeletal Exam: ***  CDAI  Exam: CDAI Score: -- Patient Global: --; Provider Global: -- Swollen: --; Tender: -- Joint Exam 10/11/2022   No joint exam has been documented for this visit   There is currently no information documented on the homunculus. Go to the Rheumatology activity and complete the homunculus joint exam.  Investigation: No additional findings.  Imaging: No results found.  Recent Labs: Lab Results  Component Value Date   WBC 6.7 07/26/2021   HGB 11.9 (L) 07/26/2021   PLT 391.0 07/26/2021   NA 143 04/26/2022   K 3.3 (L) 04/26/2022   CL 101 04/26/2022   CO2 29 04/26/2022   GLUCOSE 86 04/26/2022   BUN 14 04/26/2022   CREATININE 0.72 04/26/2022   BILITOT 0.4 04/26/2022   ALKPHOS 53 04/26/2022   AST 15  04/26/2022   ALT 13 04/26/2022   PROT 7.3 04/26/2022   ALBUMIN 4.3 04/26/2022   CALCIUM 9.5 04/26/2022   GFRAA >60 05/09/2019    Speciality Comments: No specialty comments available.  Procedures:  No procedures performed Allergies: Compazine [prochlorperazine edisylate], Hydrocodone-acetaminophen, Latex, and Tramadol hcl   Assessment / Plan:     Visit Diagnoses: No diagnosis found.  Orders: No orders of the defined types were placed in this encounter.  No orders of the defined types were placed in this encounter.   Face-to-face time spent with patient was *** minutes. Greater than 50% of time was spent in counseling and coordination of care.  Follow-Up Instructions: No follow-ups on file.   Earnestine Mealing, CMA  Note - This record has been created using Editor, commissioning.  Chart creation errors have been sought, but may not always  have been located. Such creation errors do not reflect on  the standard of medical care.

## 2022-10-04 DIAGNOSIS — M79641 Pain in right hand: Secondary | ICD-10-CM | POA: Diagnosis not present

## 2022-10-11 ENCOUNTER — Ambulatory Visit: Payer: BC Managed Care – PPO | Admitting: Rheumatology

## 2022-10-11 DIAGNOSIS — M5030 Other cervical disc degeneration, unspecified cervical region: Secondary | ICD-10-CM

## 2022-10-11 DIAGNOSIS — F419 Anxiety disorder, unspecified: Secondary | ICD-10-CM

## 2022-10-11 DIAGNOSIS — C50412 Malignant neoplasm of upper-outer quadrant of left female breast: Secondary | ICD-10-CM

## 2022-10-11 DIAGNOSIS — M19041 Primary osteoarthritis, right hand: Secondary | ICD-10-CM

## 2022-10-11 DIAGNOSIS — I10 Essential (primary) hypertension: Secondary | ICD-10-CM

## 2022-10-11 DIAGNOSIS — F5101 Primary insomnia: Secondary | ICD-10-CM

## 2022-10-11 DIAGNOSIS — Z96653 Presence of artificial knee joint, bilateral: Secondary | ICD-10-CM

## 2022-10-11 DIAGNOSIS — R700 Elevated erythrocyte sedimentation rate: Secondary | ICD-10-CM

## 2022-10-11 DIAGNOSIS — Z8639 Personal history of other endocrine, nutritional and metabolic disease: Secondary | ICD-10-CM

## 2022-10-11 DIAGNOSIS — I671 Cerebral aneurysm, nonruptured: Secondary | ICD-10-CM

## 2022-10-11 DIAGNOSIS — M19072 Primary osteoarthritis, left ankle and foot: Secondary | ICD-10-CM

## 2022-10-11 DIAGNOSIS — G8929 Other chronic pain: Secondary | ICD-10-CM

## 2022-10-11 DIAGNOSIS — E559 Vitamin D deficiency, unspecified: Secondary | ICD-10-CM

## 2022-10-12 ENCOUNTER — Telehealth: Payer: Self-pay | Admitting: Hematology and Oncology

## 2022-10-12 NOTE — Telephone Encounter (Signed)
Patient called to r/s missed appointment from October. Patient is now experiencing worsening bruising on arm. R/s patient to next available day where MD was not overbooked.

## 2022-10-14 ENCOUNTER — Ambulatory Visit (INDEPENDENT_AMBULATORY_CARE_PROVIDER_SITE_OTHER): Payer: BC Managed Care – PPO | Admitting: Family Medicine

## 2022-10-14 ENCOUNTER — Encounter: Payer: Self-pay | Admitting: Family Medicine

## 2022-10-14 VITALS — BP 132/78 | HR 90 | Temp 98.0°F | Ht 62.0 in | Wt 211.4 lb

## 2022-10-14 DIAGNOSIS — R233 Spontaneous ecchymoses: Secondary | ICD-10-CM | POA: Diagnosis not present

## 2022-10-14 DIAGNOSIS — R400 Somnolence: Secondary | ICD-10-CM

## 2022-10-14 DIAGNOSIS — E559 Vitamin D deficiency, unspecified: Secondary | ICD-10-CM | POA: Insufficient documentation

## 2022-10-14 DIAGNOSIS — Z23 Encounter for immunization: Secondary | ICD-10-CM | POA: Diagnosis not present

## 2022-10-14 LAB — CBC
HCT: 37.8 % (ref 36.0–46.0)
Hemoglobin: 12.3 g/dL (ref 12.0–15.0)
MCHC: 32.5 g/dL (ref 30.0–36.0)
MCV: 86.1 fl (ref 78.0–100.0)
Platelets: 390 10*3/uL (ref 150.0–400.0)
RBC: 4.39 Mil/uL (ref 3.87–5.11)
RDW: 14 % (ref 11.5–15.5)
WBC: 7.6 10*3/uL (ref 4.0–10.5)

## 2022-10-14 LAB — PROTIME-INR
INR: 1.1 ratio — ABNORMAL HIGH (ref 0.8–1.0)
Prothrombin Time: 11.7 s (ref 9.6–13.1)

## 2022-10-14 MED ORDER — VITAMIN D (ERGOCALCIFEROL) 1.25 MG (50000 UNIT) PO CAPS: 50000.0000 [IU] | ORAL_CAPSULE | ORAL | 1 refills | Status: DC

## 2022-10-14 NOTE — Patient Instructions (Signed)
Starleen Blue, or Zepbound -- ask your insurance about these medications, ask what needs to be documented in order to get these medications.

## 2022-10-14 NOTE — Progress Notes (Signed)
CBC is normal, her INR is just barely elevated-- not enough to cause bleeding but we will need to watch this over time. If she has any more bruising please have her let us know

## 2022-10-14 NOTE — Progress Notes (Signed)
Established Patient Office Visit  Subjective   Patient ID: Paula Jacobs, female    DOB: 1961-03-09  Age: 62 y.o. MRN: [id number]  Chief Complaint  Patient presents with   Bleeding/Bruising    Patient states she noticed a bruise on the left forearm 2 days ago, denies an injury    Pt is reporting a new bruise on her left arm that she noticed about 2 days ago and she doesn't know how she got it. States that it was concerning to her because she doesn't normally bruise without knowing where it came from. She denies any bleeding from anywhere else, no bruising on other parts of her body.   Patient reports and increase in fatigue and sleepiness, states that she has a history of insomnia. Patient does take trazodone at night but not when she is working because she she groggy when she wakes up so she only takes it on the weekends mostly. States that she is able to fall asleep ok but she has multiple nighttime awakenings, states she is not sure why she is waking up, there is no pain, no anxiety, no need to urinate. States she has never been evaluated for sleep apnea.   Patient expresses a desire to lose weight. She states that it is interfering with her ability to engage in physical activity and is affecting her health more. She is hoping she would be a candidate for the GLP agonist therapy.    Current Outpatient Medications  Medication Instructions   acetaminophen (TYLENOL) 1,000 mg, Oral, Every 6 hours PRN   benazepril-hydrochlorthiazide (LOTENSIN HCT) 10-12.5 MG tablet 1 tablet, Oral, Daily   benzonatate (TESSALON PERLES) 100 MG capsule 1-2 capsules up to twice daily as needed for cough   buPROPion (WELLBUTRIN XL) 150 mg, Oral, Daily   diphenhydrAMINE (BENADRYL) 25-50 mg, Oral, Every 6 hours PRN   ibuprofen (ADVIL) 800 mg, Oral, Every 8 hours PRN   letrozole (FEMARA) 2.5 MG tablet TAKE 1 TABLET(2.5 MG) BY MOUTH DAILY   SYNTHROID 175 MCG tablet TAKE 1 TABLET BY MOUTH EVERYDAY BEFORE  BREAKFAST   traZODone (DESYREL) 25-50 mg, Oral, At bedtime PRN   Vitamin D (Ergocalciferol) (DRISDOL) 50,000 Units, Oral, Every 7 days     Patient Active Problem List   Diagnosis Date Noted   Vitamin D deficiency 10/14/2022   Primary osteoarthritis of both hands 12/23/2021   Primary osteoarthritis of both feet 12/23/2021   DDD (degenerative disc disease), cervical 12/23/2021   Port-A-Cath in place 09/26/2018   Breast cancer, left ([place]) 08/22/2018   Breast cancer, stage 2, left ([place]) 08/21/2018   Genetic testing 08/08/2018   Family history of breast cancer    Malignant neoplasm of upper-outer quadrant of left breast in female, estrogen receptor positive ([place]) 07/19/2018   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/05/2017   Aneurysm ([place]) 10/19/2017   Morbid obesity ([place]) 10/22/2014   Hypertension 01/31/2011   Hypothyroidism 10/17/2007      Review of Systems  All other systems reviewed and are negative.     Objective:     BP 132/78 (BP Location: Right Arm, Patient Position: Sitting, Cuff Size: Large)   Pulse 90   Temp 98 F (36.7 C) (Oral)   Ht '5\' 2"'$  (1.575 m)   Wt 211 lb 6.4 oz (95.9 kg)   SpO2 98%   BMI 38.67 kg/m  BP Readings from Last 3 Encounters:  10/14/22 132/78  04/26/22 128/82  12/23/21 (!) 146/91      Physical Exam Vitals  reviewed.  Constitutional:      Appearance: Normal appearance. She is well-groomed. She is obese.  HENT:     Head: Normocephalic and atraumatic.  Eyes:     Conjunctiva/sclera: Conjunctivae normal.  Pulmonary:     Effort: Pulmonary effort is normal.     Breath sounds: Normal air entry.  Skin:    Findings: Bruising (1 bruise seen on the left forearm, there are no petechiae or purpura noted.) present.  Neurological:     Mental Status: She is alert and oriented to person, place, and time. Mental status is at baseline.     Gait: Gait is intact.  Psychiatric:        Mood and Affect: Mood and affect normal.        Speech: Speech normal.        Behavior:  Behavior normal.        Judgment: Judgment normal.      No results found for any visits on 10/14/22.  Last vitamin D -- results reviewed with patient Lab Results  Component Value Date   VD25OH 14.72 (L) 04/26/2022      The 10-year ASCVD risk score (Arnett DK, et al., 2019) is: 12.9%    Assessment & Plan:   Problem List Items Addressed This Visit       Unprioritized   Vitamin D deficiency    Pt did not get the vitamin D supplements that were called in, I will send a new rx and I advised that this could be contributing to her fatigue.  New rx sent. Will recheck vitamin D level in 6 months.      Relevant Medications   Vitamin D, Ergocalciferol, (DRISDOL) 1.25 MG (50000 UNIT) CAPS capsule   Other Visit Diagnoses     Daytime somnolence    -  Primary   Relevant Orders   Chronic, recurrent, pt has never been evaluated for OSA, will place referral to pulmonary for sleep study.  Ambulatory referral to Pulmonology   Easy bruising       Relevant Orders   No signficant bleeding otherwise, will order CBC and INR to rule out any issues.  CBC (no diff)   Protime-INR   Immunization due       Relevant Orders   Zoster Recombinant (Shingrix )     I advised patient to call her insurance company to ask about coverage for weight loss services. Pt is to contact me to let me know if it would be an option.   Return in about 6 months (around 04/14/2023).    Farrel Conners, MD

## 2022-10-14 NOTE — Assessment & Plan Note (Signed)
Pt did get the vitamin D supplements that were called in, I will send a new rx and I advised that this could be contributing to her fatigue.  New rx sent

## 2022-10-17 ENCOUNTER — Telehealth: Payer: Self-pay | Admitting: Family Medicine

## 2022-10-17 NOTE — Telephone Encounter (Signed)
See results note. 

## 2022-10-17 NOTE — Telephone Encounter (Signed)
Pt returning call for lab results  

## 2022-10-28 ENCOUNTER — Institutional Professional Consult (permissible substitution): Payer: BC Managed Care – PPO | Admitting: Nurse Practitioner

## 2022-11-03 ENCOUNTER — Inpatient Hospital Stay: Payer: BC Managed Care – PPO | Admitting: Hematology and Oncology

## 2022-11-03 NOTE — Assessment & Plan Note (Deleted)
08/21/2018:Bilateral mastectomies: Left mastectomy: IDC grade 3, 2.1 cm, high-grade DCIS, margins negative, negative for LV I, 3/9 lymph nodes positive, ER 95% positive, PR 90% positive, HER-2 -1+, Ki-67 30% T2N1A stage IIa right mastectomy: Benign MammaPrint: High risk luminal type B Echocardiogram 09/18/2018: EF 55 to 60% Patient has been enrolled and upbeat clinical trial: No adverse effects related to the trial   Treatment plan: 1.  Adjuvant chemotherapy with dose dense Adriamycin and Cytoxan x4 followed by weekly Taxol x1 discontinued due to intolerance 2.  Followed by adjuvant radiation completed 04/20/2019 at [facility] 3.  Followed by adjuvant antiestrogen therapy started 05/03/2019 ------------------------------------------------------------------------------------------------------------------ Current Treatment: Anastrozole 1 mg daily started 05/03/2019 discontinued soon after because of nausea Currently on letrozole   Left lower chest wall discomfort: It is tender to touch I suspect may be costochondritis or intercostal myalgia.    CT chest 05/13/2021: No evidence of metastatic disease in the chest I recommended starting her on gabapentin 300 mg nightly.   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  She is excited about this new job which will offer her opportunity to exercise.    Breast cancer surveillance: 1.  Breast exams 11/03/2022: Bilateral mastectomy scars tenderness in the left lower chest wall 2.  No role of routine imaging studies because she had bilateral mastectomies    Return to clinic in 1 year for follow-up

## 2022-11-07 NOTE — Progress Notes (Signed)
Patient Care Team: Farrel Conners, MD as PCP - General (Family Medicine) Hurley Cisco, MD as Consulting Physician (Rheumatology) [name], MD as Consulting Physician (Orthopedic Surgery) Erroll Luna, MD as Consulting Physician (General Surgery) Nicholas Lose, MD as Consulting Physician (Hematology and Oncology)  DIAGNOSIS: No diagnosis found.  SUMMARY OF ONCOLOGIC HISTORY: Oncology History  Malignant neoplasm of upper-outer quadrant of left breast in female, estrogen receptor positive ([place])  07/13/2018 Initial Diagnosis    Palpable left breast mass, 2 adjacent masses at 2 o'clock position 2 cm biopsy IDC grade 2-3, Ig DCIS, ER 95%, PR 90%, Ki-67 30%, HER-2 -1+; 2:30 position intramammary lymph node 1.3 cm biopsy positive; left axillary lymph node biopsy negative discordant, T2N1, stage IIa   07/25/2018 Cancer Staging   Staging form: Breast, AJCC 8th Edition - Clinical: Stage IIA (cT1c, cN1, cM0, G3, ER+, PR+, HER2-) - Signed by Nicholas Lose, MD on 07/25/2018   08/08/2018 Genetic Testing   Genetic testing performed through [employer]'s Cancernext + RNA insight panel reported out on 08/08/2018 showed no pathogenic mutations. The CancerNext + RNA insight gene panel offered by [employer] includes sequencing and rearrangement analysis for the following 34 genes:   APC*, ATM*, BARD1, BMPR1A, BRCA1*, BRCA2*, BRIP1*, CDH1*, CDK4, CDKN2A, CHEK2*, DICER1,HOXB13, MLH1*, MRE11A, MSH2*, MSH6*, MUTYH*, NBN, NF1*, PALB2*, PMS2*, POLD1, POLE, PTEN*, RAD50, RAD51C*, RAD51D*, SMAD4, SMARCA4, STK11 and TP53* (sequencing and deletion/duplication); EPCAM and GREM1 (deletion/duplication only). DNA and RNA analyses performed for * genes   08/21/2018 Surgery   Bilateral mastectomies: Left mastectomy: IDC grade 3, 2.1 cm, high-grade DCIS, margins negative, negative for LV I, 3/9 lymph nodes positive, ER 95% positive, PR 90% positive, HER-2 -1+, Ki-67 30% T2N1A stage IIa right mastectomy: Benign    09/03/2018 Cancer Staging   Staging form: Breast, AJCC 8th Edition - Pathologic: Stage IIA (pT2, pN1a, cM0, G3, ER+, PR+, HER2-) - Signed by Nicholas Lose, MD on 09/03/2018   09/03/2018 Miscellaneous   MammaPrint: High risk luminal type B, average 10-year risk of recurrence untreated 29%, predicted 5-year benefit of chemotherapy 94.6% distant metastasis free interval   09/26/2018 - 12/04/2018 Chemotherapy   Adjuvant chemotherapy with dose dense Adriamycin and Cytoxan x4 followed by weekly Taxol x1   03/19/2019 - 04/02/2019 Radiation Therapy   Adjuvant XRT at [facility] "Between 03/19/2019 and 04/02/2019 the patient received 5800 cGy in 29 fractions (of a planned 6000 cGy in 30 fractions) to the left chest wall mastectomy scar region. Treatment initially encompassed the left chest wall and left supraclavicular/axillary region delivering 5000 cGy in 25 fractions. The left chest wall was treated using opposed tangential field in field approach with a combination of 6 MV and 23 MV photons. The left supraclavicular/axillary region was treated using a RAO/PA technique with 23 MV photons. This was followed by a boost to the left chest wall mastectomy scar region delivering 800 cGy in 4 fractions (of a planned 1000 cGy in 5 fractions) using an en face technique with a 9 MeV electron beam. The treatment course was completed over 42 elapsed days."   04/2019 -  Anti-estrogen oral therapy   Anastrozole daily, discontinued soon after starting because of nausea, switched to letrozole 03/19/20     CHIEF COMPLIANT:   INTERVAL HISTORY: Paula Jacobs is a   ALLERGIES:  is allergic to compazine [prochlorperazine edisylate], hydrocodone-acetaminophen, latex, and tramadol hcl.  MEDICATIONS:  Current Outpatient Medications  Medication Sig Dispense Refill   acetaminophen (TYLENOL) 500 MG tablet Take 1,000 mg by mouth  every 6 (six) hours as needed for moderate pain.     benazepril-hydrochlorthiazide (LOTENSIN HCT)  10-12.5 MG tablet TAKE 1 TABLET BY MOUTH EVERYDAY 90 tablet 1   benzonatate (TESSALON PERLES) 100 MG capsule 1-2 capsules up to twice daily as needed for cough 30 capsule 0   buPROPion (WELLBUTRIN XL) 150 MG 24 hr tablet Take 1 tablet (150 mg total) by mouth daily. 90 tablet 0   diphenhydrAMINE (BENADRYL) 25 MG tablet Take 25-50 mg by mouth every 6 (six) hours as needed for allergies.     ibuprofen (ADVIL) 800 MG tablet Take 1 tablet (800 mg total) by mouth every 8 (eight) hours as needed. 30 tablet 0   letrozole (FEMARA) 2.5 MG tablet TAKE 1 TABLET(2.5 MG) BY MOUTH DAILY 90 tablet 3   SYNTHROID 175 MCG tablet TAKE 1 TABLET BY MOUTH EVERYDAY BEFORE BREAKFAST 90 tablet 1   traZODone (DESYREL) 50 MG tablet Take 0.5-1 tablets (25-50 mg total) by mouth at bedtime as needed for sleep. 90 tablet 1   Vitamin D, Ergocalciferol, (DRISDOL) 1.25 MG (50000 UNIT) CAPS capsule Take 1 capsule (50,000 Units total) by mouth every 7 (seven) days. 12 capsule 1   No current facility-administered medications for this visit.   Facility-Administered Medications Ordered in Other Visits  Medication Dose Route Frequency Provider Last Rate Last Admin   sodium chloride flush (NS) 0.9 % injection 10 mL  10 mL Intracatheter PRN Nicholas Lose, MD        PHYSICAL EXAMINATION: ECOG PERFORMANCE STATUS: {CHL ONC ECOG [id number]  There were no vitals filed for this visit. There were no vitals filed for this visit.  BREAST:*** No palpable masses or nodules in either right or left breasts. No palpable axillary supraclavicular or infraclavicular adenopathy no breast tenderness or nipple discharge. (exam performed in the presence of a chaperone)  LABORATORY DATA:  I have reviewed the data as listed    Latest Ref Rng & Units 04/26/2022    9:46 AM 10/07/2021    8:59 AM 07/26/2021   10:18 AM  CMP  Glucose 70 - 99 mg/dL 86   92   BUN 6 - 23 mg/dL 14   21   Creatinine 0.40 - 1.20 mg/dL 0.72   0.64   Sodium 135 - 145 mEq/L  143   138   Potassium 3.5 - 5.1 mEq/L 3.3   3.7   Chloride 96 - 112 mEq/L 101   101   CO2 19 - 32 mEq/L 29   30   Calcium 8.4 - 10.5 mg/dL 9.5   9.4   Total Protein 6.0 - 8.3 g/dL 7.3  6.6  7.2   Total Bilirubin 0.2 - 1.2 mg/dL 0.4   0.5   Alkaline Phos 39 - 117 U/L 53   63   AST 0 - 37 U/L 15   15   ALT 0 - 35 U/L 13   14     Lab Results  Component Value Date   WBC 7.6 10/14/2022   HGB 12.3 10/14/2022   HCT 37.8 10/14/2022   MCV 86.1 10/14/2022   PLT 390.0 10/14/2022   NEUTROABS 4.6 07/26/2021    ASSESSMENT & PLAN:  No problem-specific Assessment & Plan notes found for this encounter.    No orders of the defined types were placed in this encounter.  The patient has a good understanding of the overall plan. she agrees with it. she will call with any problems that may develop before the next visit  here. Total time spent: 30 mins including face to face time and time spent for planning, charting and co-ordination of care   Suzzette Righter, [place] 11/07/22    I Gardiner Coins am acting as a Education administrator for [employer]  ***

## 2022-11-07 NOTE — Assessment & Plan Note (Signed)
08/21/2018:Bilateral mastectomies: Left mastectomy: IDC grade 3, 2.1 cm, high-grade DCIS, margins negative, negative for LV I, 3/9 lymph nodes positive, ER 95% positive, PR 90% positive, HER-2 -1+, Ki-67 30% T2N1A stage IIa right mastectomy: Benign MammaPrint: High risk luminal type B Echocardiogram 09/18/2018: EF 55 to 60% Patient has been enrolled and upbeat clinical trial: No adverse effects related to the trial   Treatment plan: 1.  Adjuvant chemotherapy with dose dense Adriamycin and Cytoxan x4 followed by weekly Taxol x1 discontinued due to intolerance 2.  Followed by adjuvant radiation completed 04/20/2019 at [facility] 3.  Followed by adjuvant antiestrogen therapy started 05/03/2019 ------------------------------------------------------------------------------------------------------------------ Current Treatment: Anastrozole 1 mg daily started 05/03/2019 discontinued soon after because of nausea Currently on letrozole   Left lower chest wall discomfort: It is tender to touch I suspect may be costochondritis or intercostal myalgia.    CT chest 05/13/2021: No evidence of metastatic disease in the chest I recommended starting her on gabapentin 300 mg nightly.   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  She is excited about this new job which will offer her opportunity to exercise.  Breast cancer surveillance: 1.  Breast exams 11/08/22: Bilateral mastectomy scars tenderness in the left lower chest wall 2.  No role of routine imaging studies because she had bilateral mastectomies    Return to clinic in 1 year for follow-up

## 2022-11-08 ENCOUNTER — Inpatient Hospital Stay: Payer: BC Managed Care – PPO | Attending: Hematology and Oncology | Admitting: Hematology and Oncology

## 2022-11-08 ENCOUNTER — Other Ambulatory Visit: Payer: Self-pay

## 2022-11-08 VITALS — BP 149/89 | HR 66 | Temp 97.3°F | Resp 18 | Wt 220.4 lb

## 2022-11-08 DIAGNOSIS — C50412 Malignant neoplasm of upper-outer quadrant of left female breast: Secondary | ICD-10-CM | POA: Diagnosis not present

## 2022-11-08 DIAGNOSIS — Z170 Estrogen receptor positive status [ER+]: Secondary | ICD-10-CM

## 2022-11-08 DIAGNOSIS — Z79811 Long term (current) use of aromatase inhibitors: Secondary | ICD-10-CM | POA: Insufficient documentation

## 2022-11-08 DIAGNOSIS — Z780 Asymptomatic menopausal state: Secondary | ICD-10-CM

## 2022-11-10 NOTE — Progress Notes (Deleted)
Office Visit Note  Patient: Paula Jacobs             Date of Birth: March 05, 1961           MRN: [id number]             PCP: Farrel Conners, MD Referring: Farrel Conners, MD Visit Date: 11/24/2022 Occupation: @GUAROCC$ @  Subjective:  No chief complaint on file.   History of Present Illness: Paula Jacobs is a 62 y.o. female ***     Activities of Daily Living:  Patient reports morning stiffness for *** {minute/hour:19697}.   Patient {ACTIONS;DENIES/REPORTS:21021675::"Denies"} nocturnal pain.  Difficulty dressing/grooming: {ACTIONS;DENIES/REPORTS:21021675::"Denies"} Difficulty climbing stairs: {ACTIONS;DENIES/REPORTS:21021675::"Denies"} Difficulty getting out of chair: {ACTIONS;DENIES/REPORTS:21021675::"Denies"} Difficulty using hands for taps, buttons, cutlery, and/or writing: {ACTIONS;DENIES/REPORTS:21021675::"Denies"}  No Rheumatology ROS completed.   PMFS History:  Patient Active Problem List   Diagnosis Date Noted   Vitamin D deficiency 10/14/2022   Primary osteoarthritis of both hands 12/23/2021   Primary osteoarthritis of both feet 12/23/2021   DDD (degenerative disc disease), cervical 12/23/2021   Port-A-Cath in place 09/26/2018   Breast cancer, left ([place]) 08/22/2018   Breast cancer, stage 2, left ([place]) 08/21/2018   Genetic testing 08/08/2018   Family history of breast cancer    Malignant neoplasm of upper-outer quadrant of left breast in female, estrogen receptor positive ([place]) 07/19/2018   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/05/2017   Aneurysm ([place]) 10/19/2017   Morbid obesity ([place]) 10/22/2014   Hypertension 01/31/2011   Hypothyroidism 10/17/2007    Past Medical History:  Diagnosis Date   ANEMIA-NOS 04/23/2008   Aneurysm ([place]) 10/19/2017   Evaluated by MRA 10/2017, 33m, stable   Anginal pain ([place]    went to ED in april 2018 c/o chest pain over last 2 months ; had EKG, CXR  and troponin negative per physician suspected musculoskeletal  ; dc'd with ibuprofen   and recc f/u with outpatient stress test; see care everywhere ED visit  ; patient denies recurrence of Chest pain since, endorses occ palpitations    Anxiety    Cancer ([place] 2019   left breast ca- had bil mast   DEPRESSION 04/23/2008   DJD (degenerative joint disease)    Family history of breast cancer    Hyperplastic colon polyp    x2   Hypertension    HYPOTHYROIDISM 10/17/2007   Osteoarthritis    Palpitations    freq at night;  at pre-op states she drinks caffeine and that makes it worse; says the palpitations started after they took her thyroid    PAT 10/27/2009   Qualifier: Diagnosis of  By: MAundra Dubin MD, [name]     Pneumonia    "in the past, havent had it in years"   PONV (postoperative nausea and vomiting)    only after 1979 surgery   RA (rheumatoid arthritis) ([place]    "problems in feet, hands, and knees"   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/05/2017   SVT (supraventricular tachycardia)    chronic    Tubulovillous adenoma of colon     Family History  Problem Relation Age of Onset   Breast cancer Mother 448      metastatic to brain, died at 466  Dementia Father        died at 739  Cancer Sister    Schizophrenia Brother    Cancer Brother    COPD Maternal Aunt    Cancer Paternal Uncle  details unk   Heart attack Maternal Grandmother    Stroke Maternal Grandmother    Diabetes Maternal Grandmother    Heart disease Maternal Grandmother    Thyroid disease Maternal Grandmother    Esophageal cancer Neg Hx    Rectal cancer Neg Hx    Stomach cancer Neg Hx    Past Surgical History:  Procedure Laterality Date   ABDOMINAL HYSTERECTOMY     APPENDECTOMY     BUNIONECTOMY  10/2009 right & 02/03/10 left   COLONOSCOPY  10/22/2021   with biopsy   COLONOSCOPY W/ POLYPECTOMY  09/19/2013   HAMMER TOE SURGERY     "all toes have pins, done with bunionectomy"   INGUINAL HERNIA REPAIR     KNEE CLOSED REDUCTION Left 12/05/2017   Procedure: CLOSED MANIPULATION LEFT KNEE;   Surgeon: [name], MD;  Location: [facility];  Service: Orthopedics;  Laterality: Left;   MASTECTOMY WITH RADIOACTIVE SEED GUIDED EXCISION AND AXILLARY SENTINEL LYMPH NODE BIOPSY Bilateral 08/21/2018   Procedure: BILATERAL SIMPLE MASTECTOMIES WITH LEFT AXILLARY RADIOACTIVE SEED GUIDED LYMPH NODE EXCISION AND LEFT AXILLARY SENTINEL LYMPH NODE BIOPSY;  Surgeon: Erroll Luna, MD;  Location: [facility];  Service: General;  Laterality: Bilateral;   neck fusion     x3   PORT-A-CATH REMOVAL Right 05/15/2019   Procedure: REMOVAL PORT-A-CATH;  Surgeon: Erroll Luna, MD;  Location: [facility];  Service: General;  Laterality: Right;   PORTACATH PLACEMENT Right 09/25/2018   Procedure: INSERTION PORT-A-CATH WITH ULTRASOUND ERAS PATHWAY;  Surgeon: Erroll Luna, MD;  Location: [facility];  Service: General;  Laterality: Right;   THYROIDECTOMY  09/20/1999   for nodules   TOE AMPUTATION  09/19/1977   6th toe removed from each foot   TOTAL KNEE ARTHROPLASTY Left 10/21/2014   Procedure: LEFT TOTAL KNEE ARTHROPLASTY;  Surgeon: [name], MD;  Location: [facility];  Service: Orthopedics;  Laterality: Left;   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 Married - 2006  1 son '92  1 dtr '94. Work - at [employer].   Immunization History  Administered Date(s) Administered   Influenza,inj,Quad PF,6+ Mos 06/02/2015, 10/12/2017   Influenza,inj,quad, With Preservative 10/12/2017   PFIZER(Purple Top)SARS-COV-2 Vaccination 11/21/2019, 12/18/2019   PPD Test 07/23/2015, 08/06/2015   Td 03/10/2010   Tdap 07/26/2021   Zoster Recombinat (Shingrix) 04/26/2022, 10/14/2022     Objective: Vital Signs: There were no vitals taken for this visit.   Physical Exam    Musculoskeletal Exam: ***  CDAI Exam: CDAI Score: -- Patient Global: --; Provider Global: -- Swollen: --; Tender: -- Joint Exam 11/24/2022   No joint exam has been documented for this visit   There is currently no information documented on the homunculus. Go to the Rheumatology activity and complete the homunculus joint exam.  Investigation: No additional findings.  Imaging: No results found.  Recent Labs: Lab Results  Component Value Date   WBC 7.6 10/14/2022   HGB 12.3 10/14/2022   PLT 390.0 10/14/2022   NA 143 04/26/2022   K 3.3 (L) 04/26/2022   CL 101 04/26/2022   CO2 29 04/26/2022   GLUCOSE 86 04/26/2022   BUN 14 04/26/2022   CREATININE 0.72 04/26/2022   BILITOT 0.4 04/26/2022   ALKPHOS 53 04/26/2022  AST 15 04/26/2022   ALT 13 04/26/2022   PROT 7.3 04/26/2022   ALBUMIN 4.3 04/26/2022   CALCIUM 9.5 04/26/2022   GFRAA >60 05/09/2019    Speciality Comments: No specialty comments available.  Procedures:  No procedures performed Allergies: Compazine [prochlorperazine edisylate], Hydrocodone-acetaminophen, Latex, and Tramadol hcl   Assessment / Plan:     Visit Diagnoses: Primary osteoarthritis of both hands  Status post total bilateral knee replacement - Left total knee replacement 2016 and right total knee replacement 2019 by Dr. Alvan Dame.  Primary osteoarthritis of both feet  DDD (degenerative disc disease), cervical - S/p fusion x3.  Elevated sed rate  Primary hypertension  Brain aneurysm  Malignant neoplasm of upper-outer quadrant of left breast in female, estrogen receptor positive ([place])  History of hypothyroidism  Anxiety and depression  Primary insomnia  Vitamin D deficiency  Morbid obesity ([place])  Orders: No orders of the defined types were placed in this encounter.  No orders of the defined types were placed in this encounter.   Face-to-face time spent with patient was *** minutes. Greater than 50% of time was spent in  counseling and coordination of care.  Follow-Up Instructions: No follow-ups on file.   Ofilia Neas, PA-C  Note - This record has been created using Dragon software.  Chart creation errors have been sought, but may not always  have been located. Such creation errors do not reflect on  the standard of medical care.

## 2022-11-22 ENCOUNTER — Telehealth: Payer: Self-pay

## 2022-11-22 ENCOUNTER — Other Ambulatory Visit: Payer: Self-pay

## 2022-11-22 NOTE — Telephone Encounter (Signed)
Pt called and LVM reporting when she saw MD, they discussed how letrozole has affected her hands and pain involved there. She states in VM since stopping letrozole, pain as resolved. She would like to discuss alternate AI. Attempted to call pt to offer visit with out NP, Wilber Bihari. She did not answer. LVM for call back .

## 2022-11-22 NOTE — Progress Notes (Signed)
error 

## 2022-11-22 NOTE — Telephone Encounter (Signed)
Pt returned call and accepted appt with Wilber Bihari, NP 11/24/22 at 1415 to further discuss alternatives to letrozole.

## 2022-11-24 ENCOUNTER — Inpatient Hospital Stay: Payer: BC Managed Care – PPO | Attending: Hematology and Oncology | Admitting: Adult Health

## 2022-11-24 ENCOUNTER — Ambulatory Visit: Payer: BC Managed Care – PPO | Admitting: Rheumatology

## 2022-11-24 DIAGNOSIS — E559 Vitamin D deficiency, unspecified: Secondary | ICD-10-CM

## 2022-11-24 DIAGNOSIS — F32A Depression, unspecified: Secondary | ICD-10-CM

## 2022-11-24 DIAGNOSIS — I10 Essential (primary) hypertension: Secondary | ICD-10-CM

## 2022-11-24 DIAGNOSIS — I671 Cerebral aneurysm, nonruptured: Secondary | ICD-10-CM

## 2022-11-24 DIAGNOSIS — Z8639 Personal history of other endocrine, nutritional and metabolic disease: Secondary | ICD-10-CM

## 2022-11-24 DIAGNOSIS — F5101 Primary insomnia: Secondary | ICD-10-CM

## 2022-11-24 DIAGNOSIS — M19071 Primary osteoarthritis, right ankle and foot: Secondary | ICD-10-CM

## 2022-11-24 DIAGNOSIS — Z96653 Presence of artificial knee joint, bilateral: Secondary | ICD-10-CM

## 2022-11-24 DIAGNOSIS — M5030 Other cervical disc degeneration, unspecified cervical region: Secondary | ICD-10-CM

## 2022-11-24 DIAGNOSIS — Z170 Estrogen receptor positive status [ER+]: Secondary | ICD-10-CM

## 2022-11-24 DIAGNOSIS — R700 Elevated erythrocyte sedimentation rate: Secondary | ICD-10-CM

## 2022-11-24 DIAGNOSIS — M19041 Primary osteoarthritis, right hand: Secondary | ICD-10-CM

## 2023-01-26 ENCOUNTER — Other Ambulatory Visit: Payer: Self-pay | Admitting: Family Medicine

## 2023-01-26 ENCOUNTER — Other Ambulatory Visit: Payer: Self-pay | Admitting: Hematology and Oncology

## 2023-02-01 ENCOUNTER — Other Ambulatory Visit: Payer: Self-pay | Admitting: Hematology and Oncology

## 2023-06-21 ENCOUNTER — Encounter: Payer: Self-pay | Admitting: Family Medicine

## 2023-06-21 ENCOUNTER — Telehealth: Payer: Self-pay | Admitting: *Deleted

## 2023-06-21 ENCOUNTER — Ambulatory Visit: Payer: BC Managed Care – PPO | Admitting: Family Medicine

## 2023-06-21 VITALS — BP 152/100 | HR 90 | Temp 98.8°F | Ht 62.0 in | Wt 217.2 lb

## 2023-06-21 DIAGNOSIS — Z13220 Encounter for screening for lipoid disorders: Secondary | ICD-10-CM | POA: Diagnosis not present

## 2023-06-21 DIAGNOSIS — R350 Frequency of micturition: Secondary | ICD-10-CM | POA: Diagnosis not present

## 2023-06-21 DIAGNOSIS — C50912 Malignant neoplasm of unspecified site of left female breast: Secondary | ICD-10-CM

## 2023-06-21 DIAGNOSIS — B070 Plantar wart: Secondary | ICD-10-CM

## 2023-06-21 DIAGNOSIS — E559 Vitamin D deficiency, unspecified: Secondary | ICD-10-CM

## 2023-06-21 DIAGNOSIS — E039 Hypothyroidism, unspecified: Secondary | ICD-10-CM

## 2023-06-21 DIAGNOSIS — I10 Essential (primary) hypertension: Secondary | ICD-10-CM

## 2023-06-21 DIAGNOSIS — F321 Major depressive disorder, single episode, moderate: Secondary | ICD-10-CM

## 2023-06-21 LAB — POC URINALSYSI DIPSTICK (AUTOMATED)
Bilirubin, UA: NEGATIVE
Blood, UA: NEGATIVE
Glucose, UA: NEGATIVE
Ketones, UA: NEGATIVE
Leukocytes, UA: NEGATIVE
Nitrite, UA: NEGATIVE
Protein, UA: NEGATIVE
Spec Grav, UA: 1.015 (ref 1.010–1.025)
Urobilinogen, UA: 0.2 U/dL
pH, UA: 7 (ref 5.0–8.0)

## 2023-06-21 LAB — VITAMIN D 25 HYDROXY (VIT D DEFICIENCY, FRACTURES): VITD: 23.59 ng/mL — ABNORMAL LOW (ref 30.00–100.00)

## 2023-06-21 LAB — COMPREHENSIVE METABOLIC PANEL
ALT: 12 U/L (ref 0–35)
AST: 15 U/L (ref 0–37)
Albumin: 4.2 g/dL (ref 3.5–5.2)
Alkaline Phosphatase: 65 U/L (ref 39–117)
BUN: 12 mg/dL (ref 6–23)
CO2: 30 meq/L (ref 19–32)
Calcium: 9.4 mg/dL (ref 8.4–10.5)
Chloride: 102 meq/L (ref 96–112)
Creatinine, Ser: 0.81 mg/dL (ref 0.40–1.20)
GFR: 78.78 mL/min (ref >60.00)
Glucose, Bld: 88 mg/dL (ref 70–99)
Potassium: 3.8 meq/L (ref 3.5–5.1)
Sodium: 140 meq/L (ref 135–145)
Total Bilirubin: 0.3 mg/dL (ref 0.2–1.2)
Total Protein: 7.3 g/dL (ref 6.0–8.3)

## 2023-06-21 LAB — LIPID PANEL
Cholesterol: 164 mg/dL (ref 0–200)
HDL: 52.3 mg/dL (ref >39.00)
LDL Cholesterol: 92 mg/dL (ref 0–99)
NonHDL: 111.81
Total CHOL/HDL Ratio: 3
Triglycerides: 98 mg/dL (ref 0.0–149.0)
VLDL: 19.6 mg/dL (ref 0.0–40.0)

## 2023-06-21 LAB — HEMOGLOBIN A1C: Hgb A1c MFr Bld: 5 % (ref 4.6–6.5)

## 2023-06-21 LAB — TSH: TSH: 17.11 u[IU]/mL — ABNORMAL HIGH (ref 0.35–5.50)

## 2023-06-21 MED ORDER — WEGOVY 0.5 MG/0.5ML ~~LOC~~ SOAJ
0.5000 mg | SUBCUTANEOUS | 0 refills | Status: DC
Start: 2023-06-21 — End: 2024-04-16

## 2023-06-21 MED ORDER — ONDANSETRON 4 MG PO TBDP
4.0000 mg | ORAL_TABLET | Freq: Three times a day (TID) | ORAL | 2 refills | Status: AC | PRN
Start: 2023-06-21 — End: ?

## 2023-06-21 MED ORDER — WEGOVY 0.25 MG/0.5ML ~~LOC~~ SOAJ
0.2500 mg | SUBCUTANEOUS | 0 refills | Status: DC
Start: 2023-06-21 — End: 2024-04-16

## 2023-06-21 MED ORDER — WEGOVY 1 MG/0.5ML ~~LOC~~ SOAJ
1.0000 mg | SUBCUTANEOUS | 0 refills | Status: DC
Start: 2023-06-21 — End: 2024-04-16

## 2023-06-21 MED ORDER — BUPROPION HCL ER (XL) 150 MG PO TB24
150.0000 mg | ORAL_TABLET | Freq: Every day | ORAL | 1 refills | Status: DC
Start: 2023-06-21 — End: 2024-06-26

## 2023-06-21 MED ORDER — SYNTHROID 175 MCG PO TABS
175.0000 ug | ORAL_TABLET | Freq: Every day | ORAL | 1 refills | Status: DC
Start: 2023-06-21 — End: 2023-06-26

## 2023-06-21 MED ORDER — BENAZEPRIL-HYDROCHLOROTHIAZIDE 20-25 MG PO TABS
1.0000 | ORAL_TABLET | Freq: Every day | ORAL | 1 refills | Status: DC
Start: 2023-06-21 — End: 2024-06-26

## 2023-06-21 NOTE — Assessment & Plan Note (Signed)
S/p thyroidectomy, needs new TSH and refills, she reports chronic obesity and difficulty losing weight since her thyroidectomy. No new symptoms-- no constipation, hair loss of cold intolerance

## 2023-06-21 NOTE — Assessment & Plan Note (Signed)
Chronic ,uncontrolled, will increase her medication to the next dose, rx sent. I will see her back in 2 months for BP recheck. Ordered CMP today. Her UA was negative for infection today, her increased urination may be from her BP medication or some other etiology.

## 2023-06-21 NOTE — Progress Notes (Signed)
Established Patient Office Visit  Subjective   Patient ID: Paula Jacobs, female    DOB: May 22, 1961  Age: 62 y.o. MRN: [id number]  Chief Complaint  Patient presents with   Medical Management of Chronic Issues   Cough    Productive with white sputum x2 weeks, worse at night, tried OTC medications with some relief   Back Pain    Patient complains of left flank pain x1 month, denies dysuria   Urinary Frequency    X1 month    Patient is here for follow up today.  Patient is reporting a 1 month history of increased urinary frequency. Pt reports no pain, dysuria, fever, no back pain, no foul odor or blood in the urine.   HTN-- BP is elevated today, pt reports that she is taking mucinex for a lingering cold/cough. States that she thinks this might be causing the increase in her blood pressure. States that advil also raises her blood pressure. She denies any chest pain, no dizziness or headaches, no SOB. Pt is not checking her blood pressure at home. We discussed increasing her medication and she is agreeable.   Cough-- pt reports she had a bad URI and has been having this lingering cough, no tightness in the chest, no Sob. States that it is improving but she is still coughing, especially at night. States that she did not have COVID.   Pt is reporting "spots on her feet" for many years, since she was a teenager. States that they have grown in size over the years. States that she has had them burned off in the past but they came right back. States that they get very painful and there is sometimes a hard knot that she is able to dig out then it feels better for a while. Would like a referral to podiatry.    Current Outpatient Medications  Medication Instructions   acetaminophen (TYLENOL) 1,000 mg, Oral, Every 6 hours PRN   benazepril-hydrochlorthiazide (LOTENSIN HCT) 20-25 MG tablet 1 tablet, Oral, Daily   benzonatate (TESSALON PERLES) 100 MG capsule 1-2 capsules up to twice daily as  needed for cough   buPROPion (WELLBUTRIN XL) 150 mg, Oral, Daily   diphenhydrAMINE (BENADRYL) 25-50 mg, Oral, Every 6 hours PRN   ibuprofen (ADVIL) 800 mg, Oral, Every 8 hours PRN   letrozole (FEMARA) 2.5 MG tablet TAKE 1 TABLET(2.5 MG) BY MOUTH DAILY   ondansetron (ZOFRAN-ODT) 4 mg, Oral, Every 8 hours PRN   Synthroid 175 mcg, Oral, Daily before breakfast   traZODone (DESYREL) 25-50 mg, Oral, At bedtime PRN   Vitamin D (Ergocalciferol) (DRISDOL) 50,000 Units, Oral, Every 7 days   Wegovy 0.25 mg, Subcutaneous, Weekly   Wegovy 0.5 mg, Subcutaneous, Weekly   Wegovy 1 mg, Subcutaneous, Weekly    Patient Active Problem List   Diagnosis Date Noted   Vitamin D deficiency 10/14/2022   Primary osteoarthritis of both hands 12/23/2021   Primary osteoarthritis of both feet 12/23/2021   DDD (degenerative disc disease), cervical 12/23/2021   Port-A-Cath in place 09/26/2018   Breast cancer, left (HCC) 08/22/2018   Breast cancer, stage 2, left (HCC) 08/21/2018   Genetic testing 08/08/2018   Family history of breast cancer    Malignant neoplasm of upper-outer quadrant of left breast in female, estrogen receptor positive (HCC) 07/19/2018   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/05/2017   Aneurysm (HCC) 10/19/2017   Morbid obesity (HCC) 10/22/2014   Essential hypertension 01/31/2011   Hypothyroidism 10/17/2007      Review  of Systems  All other systems reviewed and are negative.     Objective:     BP (!) 152/100 (BP Location: Right Arm, Patient Position: Sitting, Cuff Size: Large)   Pulse 90   Temp 98.8 F (37.1 C) (Oral)   Ht 5\' 2"  (1.575 m)   Wt 217 lb 3.2 oz (98.5 kg)   SpO2 98%   BMI 39.73 kg/m    Physical Exam Vitals reviewed.  Constitutional:      Appearance: Normal appearance. She is well-groomed and normal weight.  Eyes:     Conjunctiva/sclera: Conjunctivae normal.  Neck:     Thyroid: No thyromegaly.  Cardiovascular:     Rate and Rhythm: Normal rate and regular rhythm.     Pulses:  Normal pulses.     Heart sounds: S1 normal and S2 normal.  Pulmonary:     Effort: Pulmonary effort is normal.     Breath sounds: Normal breath sounds and air entry.  Abdominal:     General: Bowel sounds are normal.  Musculoskeletal:     Right lower leg: No edema.     Left lower leg: No edema.  Skin:    Findings: Lesion (1 lesion on the right foot and 1 lesion o nthe left foot, plantar surface, mid foot laterally on both sides. they are hard to touch, round, with shallow skin denuding in the center) present.  Neurological:     Mental Status: She is alert and oriented to person, place, and time. Mental status is at baseline.     Gait: Gait is intact.  Psychiatric:        Mood and Affect: Mood and affect normal.        Speech: Speech normal.        Behavior: Behavior normal.        Judgment: Judgment normal.      Results for orders placed or performed in visit on 06/21/23  POCT Urinalysis Dipstick (Automated)  Result Value Ref Range   Color, UA yellow    Clarity, UA clear    Glucose, UA Negative Negative   Bilirubin, UA negative    Ketones, UA negative    Spec Grav, UA 1.015 1.010 - 1.025   Blood, UA negative    pH, UA 7.0 5.0 - 8.0   Protein, UA Negative Negative   Urobilinogen, UA 0.2 0.2 or 1.0 E.U./dL   Nitrite, UA negative    Leukocytes, UA Negative Negative      The 10-year ASCVD risk score (Arnett DK, et al., 2019) is: 10.5%    Assessment & Plan:  Urinary frequency -     POCT Urinalysis Dipstick (Automated)  Essential hypertension Assessment & Plan: Chronic ,uncontrolled, will increase her medication to the next dose, rx sent. I will see her back in 2 months for BP recheck. Ordered CMP today. Her UA was negative for infection today, her increased urination may be from her BP medication or some other etiology.   Orders: -     Comprehensive metabolic panel -     Benazepril-hydroCHLOROthiazide; Take 1 tablet by mouth daily.  Dispense: 90 tablet; Refill:  1  Hypothyroidism, unspecified type Assessment & Plan: S/p thyroidectomy, needs new TSH and refills, she reports chronic obesity and difficulty losing weight since her thyroidectomy. No new symptoms-- no constipation, hair loss of cold intolerance  Orders: -     Synthroid; Take 1 tablet (175 mcg total) by mouth daily before breakfast.  Dispense: 90 tablet; Refill: 1 -  TSH  Vitamin D deficiency -     VITAMIN D 25 Hydroxy (Vit-D Deficiency, Fractures); Future  Lipid screening -     Lipid panel  Depression, major, single episode, moderate (HCC) -     buPROPion HCl ER (XL); Take 1 tablet (150 mg total) by mouth daily.  Dispense: 90 tablet; Refill: 1  Breast cancer, stage 2, left (HCC)  Morbid obesity (HCC) Assessment & Plan: I have had an extensive 30 minute conversation today with the patient about healthy eating habits, exercise, calorie and carb goals for sustainable and successful weight loss. I gave the patient caloric and protein daily intake values as well as described the importance of increasing fiber and water intake. I discussed weight loss medications that could be used in the treatment of this patient. Handouts on low carb eating were given to the patient.    Patient wants to start GLP medication and I do think that with her co morbid conditions she would be an excellent candidate for the medication. [facility] sent to pharmacy. Risks/benefits discussed with patient today. I gave her calorie, protein and carb goals and handouts of low carb diet were given. RTC 2 months  Orders: -     Hemoglobin A1c -     Wegovy; Inject 0.25 mg into the skin once a week.  Dispense: 2 mL; Refill: 0 -     Wegovy; Inject 0.5 mg into the skin once a week.  Dispense: 2 mL; Refill: 0 -     Wegovy; Inject 1 mg into the skin once a week.  Dispense: 2 mL; Refill: 0 -     Ondansetron; Take 1 tablet (4 mg total) by mouth every 8 (eight) hours as needed for nausea or vomiting.  Dispense: 20 tablet;  Refill: 2  Plantar wart of both feet Seen on both feet, will send to podiatry  -     Ambulatory referral to Podiatry     Return in about 10 weeks (around 08/30/2023) for HTN recheck-- ok to put in 15 minute slot.    Karie Georges, MD

## 2023-06-21 NOTE — Assessment & Plan Note (Addendum)
I have had an extensive 30 minute conversation today with the patient about healthy eating habits, exercise, calorie and carb goals for sustainable and successful weight loss. I gave the patient caloric and protein daily intake values as well as described the importance of increasing fiber and water intake. I discussed weight loss medications that could be used in the treatment of this patient. Handouts on low carb eating were given to the patient.    Patient wants to start GLP medication and I do think that with her co morbid conditions she would be an excellent candidate for the medication. [facility] sent to pharmacy. Risks/benefits discussed with patient today. I gave her calorie, protein and carb goals and handouts of low carb diet were given. RTC 2 months

## 2023-06-21 NOTE — Telephone Encounter (Signed)
[facility] faxed a note stating Wegovy 0.25mg  needs a PA.  PA was sent to [email or web address]-Key: [id number] pending review by insurance.

## 2023-06-21 NOTE — Patient Instructions (Addendum)
Total calories per day: 1800  Total protein: 100 grams per day  Total carbs: less than 100 grams per day

## 2023-06-22 ENCOUNTER — Other Ambulatory Visit: Payer: Self-pay | Admitting: Family Medicine

## 2023-06-22 ENCOUNTER — Other Ambulatory Visit (HOSPITAL_COMMUNITY): Payer: Self-pay

## 2023-06-22 DIAGNOSIS — E559 Vitamin D deficiency, unspecified: Secondary | ICD-10-CM

## 2023-06-22 MED ORDER — VITAMIN D (ERGOCALCIFEROL) 1.25 MG (50000 UNIT) PO CAPS: 50000.0000 [IU] | ORAL_CAPSULE | ORAL | 1 refills | Status: DC

## 2023-06-23 ENCOUNTER — Telehealth: Payer: Self-pay | Admitting: Family Medicine

## 2023-06-23 DIAGNOSIS — E039 Hypothyroidism, unspecified: Secondary | ICD-10-CM

## 2023-06-23 NOTE — Telephone Encounter (Signed)
Pt called to speak to CMA regarding her lab results.  CMA was unavailable. Pt asked that CMA call her back at her earliest convenience.

## 2023-06-26 MED ORDER — LEVOTHYROXINE SODIUM 200 MCG PO TABS
200.0000 ug | ORAL_TABLET | Freq: Every day | ORAL | 1 refills | Status: DC
Start: 2023-06-26 — End: 2024-06-26

## 2023-06-26 NOTE — Telephone Encounter (Signed)
Spoke with the patient and she stated for some reason she is not able to get into the Mychart.  Patient was informed of the results and stated she does take Levothyroxine on a empty stomach and waits for 2 hours for a meal or other medications.  Patient stated she questioned if this could be the reason for a low energy level and requested the Rx be sent to [facility].  Message sent to PCP.

## 2023-06-26 NOTE — Telephone Encounter (Signed)
Patient informed of the message below.  A follow up appt was scheduled for 12/11 with PCP and to complete labs also.

## 2023-06-26 NOTE — Telephone Encounter (Signed)
Ok I sent a 200 mcg dose of levothyroxine for her to start. She will need a repeat TSH in 8 weeks, please order this for her and have her come in for a lab appt for the lab. Yes this is likely the reason for her low energy as her thyroid level was not where it should be.

## 2023-06-29 ENCOUNTER — Ambulatory Visit: Payer: BC Managed Care – PPO | Admitting: Podiatry

## 2023-06-29 DIAGNOSIS — M778 Other enthesopathies, not elsewhere classified: Secondary | ICD-10-CM

## 2023-06-29 DIAGNOSIS — M7752 Other enthesopathy of left foot: Secondary | ICD-10-CM | POA: Diagnosis not present

## 2023-06-29 MED ORDER — TRIAMCINOLONE ACETONIDE 10 MG/ML IJ SUSP
10.0000 mg | Freq: Once | INTRAMUSCULAR | Status: AC
Start: 2023-06-29 — End: 2023-06-29
  Administered 2023-06-29: 10 mg via INTRA_ARTICULAR

## 2023-06-30 NOTE — Progress Notes (Signed)
Subjective:   Patient ID: Paula Jacobs, female   DOB: 62 y.o.   MRN: [id number]   HPI Patient presents with areas of intense discomfort plantar aspect both feet with the right foot being in the mid foot lateral side with fluid buildup and associated lesion and the left being around the fifth metatarsal head with fluid buildup and lesion that are painful.  She has been trying to trim these for years and it is getting worse as time goes on patient does not smoke likes to be active   Review of Systems  All other systems reviewed and are negative.       Objective:  Physical Exam Vitals and nursing note reviewed.  Constitutional:      Appearance: She is well-developed.  Pulmonary:     Effort: Pulmonary effort is normal.  Musculoskeletal:        General: Normal range of motion.  Skin:    General: Skin is warm.  Neurological:     Mental Status: She is alert.     Neurovascular status was found to be intact muscle strength found to be adequate exquisite discomfort fluid buildup around the fifth metatarsal base and around the fifth metatarsal head left with deep lesion formation also noted associated with the pain.  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

## 2023-07-03 ENCOUNTER — Telehealth: Payer: Self-pay | Admitting: Family Medicine

## 2023-07-03 NOTE — Telephone Encounter (Signed)
Patient is requesting a call back.  She states that a form is supposed to be coming from [facility] trying to get a PA for [facility].  Supposed to have came about a week and a half ago so she wants to make sure it came through.

## 2023-07-04 NOTE — Telephone Encounter (Signed)
Called the pharmacy for insurance information as a fax has not been received.  Prior auth for [facility] 0.25mg  was sent to [email or web address]-Key: [id number] pending review by insurance.

## 2023-07-14 DIAGNOSIS — C50911 Malignant neoplasm of unspecified site of right female breast: Secondary | ICD-10-CM | POA: Diagnosis not present

## 2023-07-14 DIAGNOSIS — C50912 Malignant neoplasm of unspecified site of left female breast: Secondary | ICD-10-CM | POA: Diagnosis not present

## 2023-08-02 ENCOUNTER — Other Ambulatory Visit: Payer: Self-pay | Admitting: *Deleted

## 2023-08-02 DIAGNOSIS — Z170 Estrogen receptor positive status [ER+]: Secondary | ICD-10-CM

## 2023-08-02 NOTE — Progress Notes (Signed)
Received call from pt with complaint of left breast lymphedema.  Pt denies recent injury, trauma, redness, or warmth.  Per MD pt needing appt with lymphedema PT.  Orders placed.

## 2023-08-02 NOTE — Therapy (Addendum)
OUTPATIENT PHYSICAL THERAPY  UPPER EXTREMITY ONCOLOGY EVALUATION  Patient Name: Paula Jacobs MRN: [id number] DOB:09-08-1961, 62 y.o., female Today's Date: 08/03/2023  END OF SESSION:  PT End of Session - 08/03/23 0909     Visit Number 1    Number of Visits 9    Date for PT Re-Evaluation 10/09/23    Authorization Type unknown at eval    PT Start Time 0800    PT Stop Time 0850    PT Time Calculation (min) 50 min    Activity Tolerance Patient tolerated treatment well    Behavior During Therapy [facility] for tasks assessed/performed             Past Medical History:  Diagnosis Date   ANEMIA-NOS 04/23/2008   Aneurysm (HCC) 10/19/2017   Evaluated by [facility] 10/2017, 2mm, stable   Anginal pain (HCC)    went to ED in april 2018 c/o chest pain over last 2 months ; had EKG, CXR  and troponin negative per physician suspected musculoskeletal  ; dc'd with ibuprofen  and recc f/u with outpatient stress test; see care everywhere ED visit  ; patient denies recurrence of Chest pain since, endorses occ palpitations    Anxiety    Cancer (HCC) 2019   left breast ca- had bil mast   DEPRESSION 04/23/2008   DJD (degenerative joint disease)    Family history of breast cancer    Hyperplastic colon polyp    x2   Hypertension    HYPOTHYROIDISM 10/17/2007   Osteoarthritis    Palpitations    freq at night;  at pre-op states she drinks caffeine and that makes it worse; says the palpitations started after they took her thyroid    PAT 10/27/2009   Qualifier: Diagnosis of  By: Shirlee Latch, MD, [name]     Pneumonia    "in the past, havent had it in years"   PONV (postoperative nausea and vomiting)    only after 1979 surgery   RA (rheumatoid arthritis) (HCC)    "problems in feet, hands, and knees"   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/05/2017   SVT (supraventricular tachycardia) (HCC)    chronic    Tubulovillous adenoma of colon    Past Surgical History:  Procedure Laterality Date   ABDOMINAL  HYSTERECTOMY     APPENDECTOMY     BUNIONECTOMY  10/2009 right & 02/03/10 left   COLONOSCOPY  10/22/2021   with biopsy   COLONOSCOPY W/ POLYPECTOMY  09/19/2013   HAMMER TOE SURGERY     "all toes have pins, done with bunionectomy"   INGUINAL HERNIA REPAIR     KNEE CLOSED REDUCTION Left 12/05/2017   Procedure: CLOSED MANIPULATION LEFT KNEE;  Surgeon: Durene Romans, MD;  Location: [facility];  Service: Orthopedics;  Laterality: Left;   MASTECTOMY WITH RADIOACTIVE SEED GUIDED EXCISION AND AXILLARY SENTINEL LYMPH NODE BIOPSY Bilateral 08/21/2018   Procedure: BILATERAL SIMPLE MASTECTOMIES WITH LEFT AXILLARY RADIOACTIVE SEED GUIDED LYMPH NODE EXCISION AND LEFT AXILLARY SENTINEL LYMPH NODE BIOPSY;  Surgeon: Harriette Bouillon, MD;  Location: [facility];  Service: General;  Laterality: Bilateral;   neck fusion     x3   PORT-A-CATH REMOVAL Right 05/15/2019   Procedure: REMOVAL PORT-A-CATH;  Surgeon: Harriette Bouillon, MD;  Location: [facility];  Service: General;  Laterality: Right;   PORTACATH PLACEMENT Right 09/25/2018   Procedure: INSERTION PORT-A-CATH WITH ULTRASOUND ERAS PATHWAY;  Surgeon: Harriette Bouillon, MD;  Location: [facility];  Service: General;  Laterality: Right;   THYROIDECTOMY  09/20/1999   for nodules   TOE AMPUTATION  09/19/1977   6th toe removed from each foot   TOTAL KNEE ARTHROPLASTY Left 10/21/2014   Procedure: LEFT TOTAL KNEE ARTHROPLASTY;  Surgeon: Shelda Pal, MD;  Location: [facility];  Service: Orthopedics;  Laterality: Left;   TOTAL KNEE ARTHROPLASTY Right 12/05/2017   Procedure: RIGHT TOTAL KNEE ARTHROPLASTY;  Surgeon: Durene Romans, MD;  Location: [facility];  Service: Orthopedics;  Laterality: Right;  70 mins   Patient Active Problem List   Diagnosis Date Noted   Vitamin D deficiency 10/14/2022   Primary osteoarthritis of both hands 12/23/2021   Primary osteoarthritis of both feet 12/23/2021   DDD (degenerative disc disease), cervical 12/23/2021    Port-A-Cath in place 09/26/2018   Breast cancer, left (HCC) 08/22/2018   Breast cancer, stage 2, left (HCC) 08/21/2018   Genetic testing 08/08/2018   Family history of breast cancer    Malignant neoplasm of upper-outer quadrant of left breast in female, estrogen receptor positive (HCC) 07/19/2018   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/05/2017   Aneurysm (HCC) 10/19/2017   Morbid obesity (HCC) 10/22/2014   Essential hypertension 01/31/2011   Hypothyroidism 10/17/2007    PCP: Nira Conn, MD  REFERRING PROVIDER: Serena Croissant, MD  REFERRING DIAG:  Diagnosis  C50.412,Z17.0 (ICD-10-CM) - Malignant neoplasm of upper-outer quadrant of left breast in female, estrogen receptor positive (HCC)    THERAPY DIAG:  Malignant neoplasm of upper-outer quadrant of left breast in female, estrogen receptor positive (HCC)  Postmastectomy lymphedema syndrome  Pain in both upper arms  ONSET DATE: 07/09/18  Rationale for Evaluation and Treatment: Rehabilitation  SUBJECTIVE:                                                                                                                                                                                           SUBJECTIVE STATEMENT: EVAL:  I just have a lot of heaviness and pain in the arm and chest.  Both of them are equal.  It has been feeling like this since surgery.  The best way I can describe it in the chest is it feels like spasms.  Those episodes last around around 2-3x per week.  I will take mm relaxers then.  I have tried gabapentin.  It does have a burning pain to it.    PERTINENT HISTORY: Patient underwent a bilateral mastectomy with a left targeted node dissection (3/9 positive nodes) on 08/21/18. Completed chemo and radiation.  She has a history of bilateral knee replacements (right in 5/19 and left in 2016) and she has had 3 cervical fusions.  OA shoulders bil.   PAIN:  Are you having pain? Yes - not currently - It can hurt during a normal day with a  wrong movement but most of the pain comes during those episodes.  NPRS scale: 10/10 Pain location: bilateral chest region  Pain orientation: Bilateral  PAIN TYPE: aching, burning, and tight Pain description: intermittent  Aggravating factors: no real pattern Relieving factors: mm relaxers  PRECAUTIONS: Lt lymphedema risk   RED FLAGS: None   WEIGHT BEARING RESTRICTIONS: No  FALLS:  Has patient fallen in last 6 months? 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 No limitations   LEISURE: I can't do as much due to my bil TKR   HAND DOMINANCE: right   PRIOR LEVEL OF FUNCTION: Independent  PATIENT GOALS: see if I can get some of pain gone and the arms   OBJECTIVE: Note: Objective measures were completed at Evaluation unless otherwise noted.  COGNITION: Overall cognitive status: Within functional limits for tasks assessed   PALPATION: Tightness bil pect and chest wall Lt>Rt  OBSERVATIONS / OTHER ASSESSMENTS: various chest wall pockets most likely skin  POSTURE: rounded shoulders  UPPER EXTREMITY AROM/PROM:  A/PROM RIGHT   eval   Shoulder extension 50  Shoulder flexion 150  Shoulder abduction 150  Shoulder internal rotation   Shoulder external rotation 90    (Blank rows = not tested)  A/PROM LEFT   eval  Shoulder extension 50  Shoulder flexion 155 - front of shoulder pull  Shoulder abduction 147 - shoulder pain only   Shoulder internal rotation   Shoulder external rotation 90- shoulder OA pain    (Blank rows = not tested)  CERVICAL AROM: All within normal limits:   UPPER EXTREMITY STRENGTH: not tested   LYMPHEDEMA ASSESSMENTS: see pertinent hx section LYMPHEDEMA ASSESSMENTS:   LANDMARK RIGHT  eval  At axilla    15 cm proximal to olecranon process 44.2  10 cm proximal to olecranon process 43.7  Olecranon process 27.5  15 cm  proximal to ulnar styloid process 26.4  10 cm proximal to ulnar styloid process 22.2  Just proximal to ulnar styloid process 15.9  Across hand at thumb web space 19.3  At base of 2nd digit 6.2  (Blank rows = not tested)  LANDMARK LEFT  eval  At axilla  43.4  15 cm proximal to olecranon process 45.5  10 cm proximal to olecranon process 44.5  Olecranon process 27.3  15 cm proximal to ulnar styloid process 27.3  10 cm proximal to ulnar styloid process 22.5  Just proximal to ulnar styloid process 16.3  Across hand at thumb web space 18.8  At base of 2nd digit 5.9  (Blank rows = not tested) Rt: Lt: 2815 Difference of 89ml  Length 7  L-DEX LYMPHEDEMA SCREENING: The patient was assessed using the L-Dex machine today. Score was 5.6 in the green zone without baseline   QUICK DASH SURVEY: 22.73%   TODAY'S TREATMENT:  DATE: 08/03/23 Eval performed Education on lymphedema and results of all measurements  Gave script for [facility] Discussed POC  PATIENT EDUCATION:  Education details: per today's note Person educated: Patient Education method: Chief Technology Officer Education comprehension: verbalized understanding  HOME EXERCISE PROGRAM: Not issued yet   ASSESSMENT:  CLINICAL IMPRESSION: Patient is a 62 y.o. female who was seen today for physical therapy evaluation and treatment for her continued chest pain and possible questions of lymphedema almost 5 years after her bil mastectomy, chemotherapy, radiation, and Lt SLNB with positive nodes.  She has intermittent, random chest wall spasms bilaterally and equally 2-3x per week but notes no real pain otherwise.  She has a complaint of heaviness in bil arms and a bad Lt shoulder due to OA limiting her exercise.  Her chest wall is tight but her incisions seem to move well.  Her  circumferences had none larger than 2cm difference, her volume difference was only 89ml, and her SOZO was in green.  She seems to not have chronic lymphedema but does notice that her arm feels a bit heavy after walking so she will get a sleeve and glove for exercise and as needed.  We discussed aquatics which pt is interested in so we will do 4 weeks here for massage and STM and then 4 weeks with aquatics.    OBJECTIVE IMPAIRMENTS: decreased activity tolerance, decreased knowledge of condition, decreased knowledge of use of DME, and decreased ROM. , pain  ACTIVITY LIMITATIONS: carrying and lifting  PARTICIPATION LIMITATIONS: cleaning and community activity  PERSONAL FACTORS: Fitness, Time since onset of injury/illness/exacerbation, and 1-2 comorbidities: radiation to the axilla, hx ofSLNB  are also affecting patient's functional outcome.   REHAB POTENTIAL: Good  CLINICAL DECISION MAKING: Stable/uncomplicated  EVALUATION COMPLEXITY: Moderate  GOALS: Goals reviewed with patient? Yes  SHORT TERM GOALS: Target date: 08/31/23   Baseline: Pt will be ind with initial stretches to continue for chest wall mobility  Goal status: INITIAL  2.  Pt will go to [facility] to get a compression sleeve and glove  Baseline:  Goal status: INITIAL   LONG TERM GOALS: Target date: 10/09/23  Pt will report decreased chest wall spasm to 0-1x per week  Baseline:  Goal status: INITIAL  2.  Pt will be ind with final HEP to include stretches and aquatic exercise  Baseline:  Goal status: INITIAL   PLAN:  PT FREQUENCY: 1x/week due to pts schedule   PT DURATION: 12 weeks  PLANNED INTERVENTIONS: 97164- PT Re-evaluation, 97110-Therapeutic exercises, 97530- Therapeutic activity, 97112- Neuromuscular re-education, 97535- Self Care, Patient/Family education, Taping, Dry Needling, Manual lymph drainage, DME instructions, Therapeutic exercises, Therapeutic activity, Neuromuscular re-education, Gait  training, and Self Care  PLAN FOR NEXT SESSION: start chest wall STM/shoulder PROM, exercise instruction   Idamae Lusher, PT 08/03/2023, 9:10 AM  PHYSICAL THERAPY DISCHARGE SUMMARY  Visits from Start of Care: 1  Current functional level related to goals / functional outcomes: No return after eval    Plan: Patient agrees to discharge.  Patient goals were not met.    Gwenevere Abbot, PT

## 2023-08-03 ENCOUNTER — Ambulatory Visit: Payer: BC Managed Care – PPO | Attending: Hematology and Oncology | Admitting: Rehabilitation

## 2023-08-03 ENCOUNTER — Encounter: Payer: Self-pay | Admitting: Rehabilitation

## 2023-08-03 ENCOUNTER — Other Ambulatory Visit: Payer: Self-pay

## 2023-08-03 DIAGNOSIS — M79622 Pain in left upper arm: Secondary | ICD-10-CM | POA: Insufficient documentation

## 2023-08-03 DIAGNOSIS — Z170 Estrogen receptor positive status [ER+]: Secondary | ICD-10-CM | POA: Insufficient documentation

## 2023-08-03 DIAGNOSIS — C50412 Malignant neoplasm of upper-outer quadrant of left female breast: Secondary | ICD-10-CM | POA: Insufficient documentation

## 2023-08-03 DIAGNOSIS — I972 Postmastectomy lymphedema syndrome: Secondary | ICD-10-CM | POA: Insufficient documentation

## 2023-08-03 DIAGNOSIS — M79621 Pain in right upper arm: Secondary | ICD-10-CM | POA: Insufficient documentation

## 2023-08-09 NOTE — Progress Notes (Deleted)
Office Visit Note  Patient: Paula Jacobs             Date of Birth: 1961-02-15           MRN: [id number]             PCP: Karie Georges, MD Referring: Karie Georges, MD Visit Date: 08/16/2023 Occupation: @GUAROCC @  Subjective:  No chief complaint on file.   History of Present Illness: Paula Jacobs is a 62 y.o. female ***     Activities of Daily Living:  Patient reports morning stiffness for *** {minute/hour:19697}.   Patient {ACTIONS;DENIES/REPORTS:21021675::"Denies"} nocturnal pain.  Difficulty dressing/grooming: {ACTIONS;DENIES/REPORTS:21021675::"Denies"} Difficulty climbing stairs: {ACTIONS;DENIES/REPORTS:21021675::"Denies"} Difficulty getting out of chair: {ACTIONS;DENIES/REPORTS:21021675::"Denies"} Difficulty using hands for taps, buttons, cutlery, and/or writing: {ACTIONS;DENIES/REPORTS:21021675::"Denies"}  No Rheumatology ROS completed.   PMFS History:  Patient Active Problem List   Diagnosis Date Noted   Vitamin D deficiency 10/14/2022   Primary osteoarthritis of both hands 12/23/2021   Primary osteoarthritis of both feet 12/23/2021   DDD (degenerative disc disease), cervical 12/23/2021   Port-A-Cath in place 09/26/2018   Breast cancer, left (HCC) 08/22/2018   Breast cancer, stage 2, left (HCC) 08/21/2018   Genetic testing 08/08/2018   Family history of breast cancer    Malignant neoplasm of upper-outer quadrant of left breast in female, estrogen receptor positive (HCC) 07/19/2018   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/05/2017   Aneurysm (HCC) 10/19/2017   Morbid obesity (HCC) 10/22/2014   Essential hypertension 01/31/2011   Hypothyroidism 10/17/2007    Past Medical History:  Diagnosis Date   ANEMIA-NOS 04/23/2008   Aneurysm (HCC) 10/19/2017   Evaluated by [facility] 10/2017, 2mm, stable   Anginal pain (HCC)    went to ED in april 2018 c/o chest pain over last 2 months ; had EKG, CXR  and troponin negative per physician suspected musculoskeletal  ; dc'd with  ibuprofen  and recc f/u with outpatient stress test; see care everywhere ED visit  ; patient denies recurrence of Chest pain since, endorses occ palpitations    Anxiety    Cancer (HCC) 2019   left breast ca- had bil mast   DEPRESSION 04/23/2008   DJD (degenerative joint disease)    Family history of breast cancer    Hyperplastic colon polyp    x2   Hypertension    HYPOTHYROIDISM 10/17/2007   Osteoarthritis    Palpitations    freq at night;  at pre-op states she drinks caffeine and that makes it worse; says the palpitations started after they took her thyroid    PAT 10/27/2009   Qualifier: Diagnosis of  By: Shirlee Latch, MD, [name]     Pneumonia    "in the past, havent had it in years"   PONV (postoperative nausea and vomiting)    only after 1979 surgery   RA (rheumatoid arthritis) (HCC)    "problems in feet, hands, and knees"   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/05/2017   SVT (supraventricular tachycardia) (HCC)    chronic    Tubulovillous adenoma of colon     Family History  Problem Relation Age of Onset   Breast cancer Mother 2       metastatic to brain, died at 48   Dementia Father        died at 73   Cancer Sister    Schizophrenia Brother    Cancer Brother    COPD Maternal Aunt    Cancer Paternal Uncle  details unk   Heart attack Maternal Grandmother    Stroke Maternal Grandmother    Diabetes Maternal Grandmother    Heart disease Maternal Grandmother    Thyroid disease Maternal Grandmother    Esophageal cancer Neg Hx    Rectal cancer Neg Hx    Stomach cancer Neg Hx    Past Surgical History:  Procedure Laterality Date   ABDOMINAL HYSTERECTOMY     APPENDECTOMY     BUNIONECTOMY  10/2009 right & 02/03/10 left   COLONOSCOPY  10/22/2021   with biopsy   COLONOSCOPY W/ POLYPECTOMY  09/19/2013   HAMMER TOE SURGERY     "all toes have pins, done with bunionectomy"   INGUINAL HERNIA REPAIR     KNEE CLOSED REDUCTION Left 12/05/2017   Procedure: CLOSED MANIPULATION  LEFT KNEE;  Surgeon: Durene Romans, MD;  Location: [facility];  Service: Orthopedics;  Laterality: Left;   MASTECTOMY WITH RADIOACTIVE SEED GUIDED EXCISION AND AXILLARY SENTINEL LYMPH NODE BIOPSY Bilateral 08/21/2018   Procedure: BILATERAL SIMPLE MASTECTOMIES WITH LEFT AXILLARY RADIOACTIVE SEED GUIDED LYMPH NODE EXCISION AND LEFT AXILLARY SENTINEL LYMPH NODE BIOPSY;  Surgeon: Harriette Bouillon, MD;  Location: [facility];  Service: General;  Laterality: Bilateral;   neck fusion     x3   PORT-A-CATH REMOVAL Right 05/15/2019   Procedure: REMOVAL PORT-A-CATH;  Surgeon: Harriette Bouillon, MD;  Location: [facility];  Service: General;  Laterality: Right;   PORTACATH PLACEMENT Right 09/25/2018   Procedure: INSERTION PORT-A-CATH WITH ULTRASOUND ERAS PATHWAY;  Surgeon: Harriette Bouillon, MD;  Location: [facility];  Service: General;  Laterality: Right;   THYROIDECTOMY  09/20/1999   for nodules   TOE AMPUTATION  09/19/1977   6th toe removed from each foot   TOTAL KNEE ARTHROPLASTY Left 10/21/2014   Procedure: LEFT TOTAL KNEE ARTHROPLASTY;  Surgeon: Shelda Pal, MD;  Location: [facility];  Service: Orthopedics;  Laterality: Left;   TOTAL KNEE ARTHROPLASTY Right 12/05/2017   Procedure: RIGHT TOTAL KNEE ARTHROPLASTY;  Surgeon: Durene Romans, MD;  Location: [facility];  Service: Orthopedics;  Laterality: Right;  70 mins   Social History   Social History Narrative   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 Married - 2006  1 son '92  1 dtr '94. Work - [employer].   Immunization History  Administered Date(s) Administered   Influenza,inj,Quad PF,6+ Mos 06/02/2015, 10/12/2017   Influenza,inj,quad, With Preservative 10/12/2017   PFIZER(Purple Top)SARS-COV-2 Vaccination 11/21/2019, 12/18/2019   PPD Test 07/23/2015, 08/06/2015   Td 03/10/2010   Tdap 07/26/2021   Zoster Recombinant(Shingrix) 04/26/2022, 10/14/2022     Objective: Vital Signs: There were no vitals taken for this visit.   Physical  Exam   Musculoskeletal Exam: ***  CDAI Exam: CDAI Score: -- Patient Global: --; Provider Global: -- Swollen: --; Tender: -- Joint Exam 08/16/2023   No joint exam has been documented for this visit   There is currently no information documented on the homunculus. Go to the Rheumatology activity and complete the homunculus joint exam.  Investigation: No additional findings.  Imaging: No results found.  Recent Labs: Lab Results  Component Value Date   WBC 7.6 10/14/2022   HGB 12.3 10/14/2022   PLT 390.0 10/14/2022   NA 140 06/21/2023   K 3.8 06/21/2023   CL 102 06/21/2023   CO2 30 06/21/2023   GLUCOSE 88 06/21/2023   BUN 12 06/21/2023   CREATININE 0.81 06/21/2023   BILITOT 0.3 06/21/2023   ALKPHOS 65 06/21/2023  AST 15 06/21/2023   ALT 12 06/21/2023   PROT 7.3 06/21/2023   ALBUMIN 4.2 06/21/2023   CALCIUM 9.4 06/21/2023   GFRAA >60 05/09/2019    Speciality Comments: No specialty comments available.  Procedures:  No procedures performed Allergies: Compazine [prochlorperazine edisylate], Hydrocodone-acetaminophen, Latex, and Tramadol hcl   Assessment / Plan:     Visit Diagnoses: No diagnosis found.  Orders: No orders of the defined types were placed in this encounter.  No orders of the defined types were placed in this encounter.   Face-to-face time spent with patient was *** minutes. Greater than 50% of time was spent in counseling and coordination of care.  Follow-Up Instructions: No follow-ups on file.   Pollyann Savoy, MD  Note - This record has been created using Animal nutritionist.  Chart creation errors have been sought, but may not always  have been located. Such creation errors do not reflect on  the standard of medical care.

## 2023-08-16 ENCOUNTER — Ambulatory Visit: Payer: BC Managed Care – PPO | Admitting: Rheumatology

## 2023-08-21 NOTE — Progress Notes (Unsigned)
Office Visit Note  Patient: Paula Jacobs             Date of Birth: June 20, 1961           MRN: [id number]             PCP: Karie Georges, MD Referring: Karie Georges, MD Visit Date: 08/24/2023 Occupation: @GUAROCC @  Subjective:  No chief complaint on file.   History of Present Illness: Paula Jacobs is a 62 y.o. female ***     Activities of Daily Living:  Patient reports morning stiffness for *** {minute/hour:19697}.   Patient {ACTIONS;DENIES/REPORTS:21021675::"Denies"} nocturnal pain.  Difficulty dressing/grooming: {ACTIONS;DENIES/REPORTS:21021675::"Denies"} Difficulty climbing stairs: {ACTIONS;DENIES/REPORTS:21021675::"Denies"} Difficulty getting out of chair: {ACTIONS;DENIES/REPORTS:21021675::"Denies"} Difficulty using hands for taps, buttons, cutlery, and/or writing: {ACTIONS;DENIES/REPORTS:21021675::"Denies"}  No Rheumatology ROS completed.   PMFS History:  Patient Active Problem List   Diagnosis Date Noted   Vitamin D deficiency 10/14/2022   Primary osteoarthritis of both hands 12/23/2021   Primary osteoarthritis of both feet 12/23/2021   DDD (degenerative disc disease), cervical 12/23/2021   Port-A-Cath in place 09/26/2018   Breast cancer, left (HCC) 08/22/2018   Breast cancer, stage 2, left (HCC) 08/21/2018   Genetic testing 08/08/2018   Family history of breast cancer    Malignant neoplasm of upper-outer quadrant of left breast in female, estrogen receptor positive (HCC) 07/19/2018   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/05/2017   Aneurysm (HCC) 10/19/2017   Morbid obesity (HCC) 10/22/2014   Essential hypertension 01/31/2011   Hypothyroidism 10/17/2007    Past Medical History:  Diagnosis Date   ANEMIA-NOS 04/23/2008   Aneurysm (HCC) 10/19/2017   Evaluated by [facility] 10/2017, 2mm, stable   Anginal pain (HCC)    went to ED in april 2018 c/o chest pain over last 2 months ; had EKG, CXR  and troponin negative per physician suspected musculoskeletal  ; dc'd with  ibuprofen  and recc f/u with outpatient stress test; see care everywhere ED visit  ; patient denies recurrence of Chest pain since, endorses occ palpitations    Anxiety    Cancer (HCC) 2019   left breast ca- had bil mast   DEPRESSION 04/23/2008   DJD (degenerative joint disease)    Family history of breast cancer    Hyperplastic colon polyp    x2   Hypertension    HYPOTHYROIDISM 10/17/2007   Osteoarthritis    Palpitations    freq at night;  at pre-op states she drinks caffeine and that makes it worse; says the palpitations started after they took her thyroid    PAT 10/27/2009   Qualifier: Diagnosis of  By: Shirlee Latch, MD, [name]     Pneumonia    "in the past, havent had it in years"   PONV (postoperative nausea and vomiting)    only after 1979 surgery   RA (rheumatoid arthritis) (HCC)    "problems in feet, hands, and knees"   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/05/2017   SVT (supraventricular tachycardia) (HCC)    chronic    Tubulovillous adenoma of colon     Family History  Problem Relation Age of Onset   Breast cancer Mother 84       metastatic to brain, died at 51   Dementia Father        died at 54   Cancer Sister    Schizophrenia Brother    Cancer Brother    COPD Maternal Aunt    Cancer Paternal Uncle  details unk   Heart attack Maternal Grandmother    Stroke Maternal Grandmother    Diabetes Maternal Grandmother    Heart disease Maternal Grandmother    Thyroid disease Maternal Grandmother    Esophageal cancer Neg Hx    Rectal cancer Neg Hx    Stomach cancer Neg Hx    Past Surgical History:  Procedure Laterality Date   ABDOMINAL HYSTERECTOMY     APPENDECTOMY     BUNIONECTOMY  10/2009 right & 02/03/10 left   COLONOSCOPY  10/22/2021   with biopsy   COLONOSCOPY W/ POLYPECTOMY  09/19/2013   HAMMER TOE SURGERY     "all toes have pins, done with bunionectomy"   INGUINAL HERNIA REPAIR     KNEE CLOSED REDUCTION Left 12/05/2017   Procedure: CLOSED MANIPULATION  LEFT KNEE;  Surgeon: Durene Romans, MD;  Location: [facility];  Service: Orthopedics;  Laterality: Left;   MASTECTOMY WITH RADIOACTIVE SEED GUIDED EXCISION AND AXILLARY SENTINEL LYMPH NODE BIOPSY Bilateral 08/21/2018   Procedure: BILATERAL SIMPLE MASTECTOMIES WITH LEFT AXILLARY RADIOACTIVE SEED GUIDED LYMPH NODE EXCISION AND LEFT AXILLARY SENTINEL LYMPH NODE BIOPSY;  Surgeon: Harriette Bouillon, MD;  Location: [facility];  Service: General;  Laterality: Bilateral;   neck fusion     x3   PORT-A-CATH REMOVAL Right 05/15/2019   Procedure: REMOVAL PORT-A-CATH;  Surgeon: Harriette Bouillon, MD;  Location: [facility];  Service: General;  Laterality: Right;   PORTACATH PLACEMENT Right 09/25/2018   Procedure: INSERTION PORT-A-CATH WITH ULTRASOUND ERAS PATHWAY;  Surgeon: Harriette Bouillon, MD;  Location: [facility];  Service: General;  Laterality: Right;   THYROIDECTOMY  09/20/1999   for nodules   TOE AMPUTATION  09/19/1977   6th toe removed from each foot   TOTAL KNEE ARTHROPLASTY Left 10/21/2014   Procedure: LEFT TOTAL KNEE ARTHROPLASTY;  Surgeon: Shelda Pal, MD;  Location: [facility];  Service: Orthopedics;  Laterality: Left;   TOTAL KNEE ARTHROPLASTY Right 12/05/2017   Procedure: RIGHT TOTAL KNEE ARTHROPLASTY;  Surgeon: Durene Romans, MD;  Location: [facility];  Service: Orthopedics;  Laterality: Right;  70 mins   Social History   Social History Narrative   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 Married - 2006  1 son '92  1 dtr '94. Work - [employer].   Immunization History  Administered Date(s) Administered   Influenza,inj,Quad PF,6+ Mos 06/02/2015, 10/12/2017   Influenza,inj,quad, With Preservative 10/12/2017   PFIZER(Purple Top)SARS-COV-2 Vaccination 11/21/2019, 12/18/2019   PPD Test 07/23/2015, 08/06/2015   Td 03/10/2010   Tdap 07/26/2021   Zoster Recombinant(Shingrix) 04/26/2022, 10/14/2022     Objective: Vital Signs: There were no vitals taken for this visit.   Physical  Exam   Musculoskeletal Exam: ***  CDAI Exam: CDAI Score: -- Patient Global: --; Provider Global: -- Swollen: --; Tender: -- Joint Exam 08/24/2023   No joint exam has been documented for this visit   There is currently no information documented on the homunculus. Go to the Rheumatology activity and complete the homunculus joint exam.  Investigation: No additional findings.  Imaging: No results found.  Recent Labs: Lab Results  Component Value Date   WBC 7.6 10/14/2022   HGB 12.3 10/14/2022   PLT 390.0 10/14/2022   NA 140 06/21/2023   K 3.8 06/21/2023   CL 102 06/21/2023   CO2 30 06/21/2023   GLUCOSE 88 06/21/2023   BUN 12 06/21/2023   CREATININE 0.81 06/21/2023   BILITOT 0.3 06/21/2023   ALKPHOS 65 06/21/2023  AST 15 06/21/2023   ALT 12 06/21/2023   PROT 7.3 06/21/2023   ALBUMIN 4.2 06/21/2023   CALCIUM 9.4 06/21/2023   GFRAA >60 05/09/2019    Speciality Comments: No specialty comments available.  Procedures:  No procedures performed Allergies: Compazine [prochlorperazine edisylate], Hydrocodone-acetaminophen, Latex, and Tramadol hcl   Assessment / Plan:     Visit Diagnoses: No diagnosis found.  Orders: No orders of the defined types were placed in this encounter.  No orders of the defined types were placed in this encounter.   Face-to-face time spent with patient was *** minutes. Greater than 50% of time was spent in counseling and coordination of care.  Follow-Up Instructions: No follow-ups on file.   Ellen Henri, CMA  Note - This record has been created using Animal nutritionist.  Chart creation errors have been sought, but may not always  have been located. Such creation errors do not reflect on  the standard of medical care.

## 2023-08-23 ENCOUNTER — Encounter (HOSPITAL_BASED_OUTPATIENT_CLINIC_OR_DEPARTMENT_OTHER): Payer: BC Managed Care – PPO | Admitting: Physical Therapy

## 2023-08-24 ENCOUNTER — Ambulatory Visit: Payer: BC Managed Care – PPO | Admitting: Rheumatology

## 2023-08-24 DIAGNOSIS — M19071 Primary osteoarthritis, right ankle and foot: Secondary | ICD-10-CM

## 2023-08-24 DIAGNOSIS — F32A Depression, unspecified: Secondary | ICD-10-CM

## 2023-08-24 DIAGNOSIS — Z96653 Presence of artificial knee joint, bilateral: Secondary | ICD-10-CM

## 2023-08-24 DIAGNOSIS — M19041 Primary osteoarthritis, right hand: Secondary | ICD-10-CM

## 2023-08-24 DIAGNOSIS — M5030 Other cervical disc degeneration, unspecified cervical region: Secondary | ICD-10-CM

## 2023-08-24 DIAGNOSIS — Z170 Estrogen receptor positive status [ER+]: Secondary | ICD-10-CM

## 2023-08-24 DIAGNOSIS — F5101 Primary insomnia: Secondary | ICD-10-CM

## 2023-08-24 DIAGNOSIS — I10 Essential (primary) hypertension: Secondary | ICD-10-CM

## 2023-08-24 DIAGNOSIS — Z8639 Personal history of other endocrine, nutritional and metabolic disease: Secondary | ICD-10-CM

## 2023-08-24 DIAGNOSIS — E559 Vitamin D deficiency, unspecified: Secondary | ICD-10-CM

## 2023-08-24 DIAGNOSIS — R700 Elevated erythrocyte sedimentation rate: Secondary | ICD-10-CM

## 2023-08-24 DIAGNOSIS — I671 Cerebral aneurysm, nonruptured: Secondary | ICD-10-CM

## 2023-08-24 DIAGNOSIS — G8929 Other chronic pain: Secondary | ICD-10-CM

## 2023-08-28 ENCOUNTER — Ambulatory Visit: Payer: BC Managed Care – PPO | Attending: Hematology and Oncology | Admitting: Rehabilitation

## 2023-08-28 ENCOUNTER — Encounter: Payer: Self-pay | Admitting: Rehabilitation

## 2023-08-28 DIAGNOSIS — Z170 Estrogen receptor positive status [ER+]: Secondary | ICD-10-CM | POA: Insufficient documentation

## 2023-08-28 DIAGNOSIS — C50412 Malignant neoplasm of upper-outer quadrant of left female breast: Secondary | ICD-10-CM | POA: Insufficient documentation

## 2023-08-28 DIAGNOSIS — M79622 Pain in left upper arm: Secondary | ICD-10-CM | POA: Insufficient documentation

## 2023-08-28 DIAGNOSIS — I972 Postmastectomy lymphedema syndrome: Secondary | ICD-10-CM | POA: Insufficient documentation

## 2023-08-28 DIAGNOSIS — M79621 Pain in right upper arm: Secondary | ICD-10-CM | POA: Insufficient documentation

## 2023-08-30 ENCOUNTER — Ambulatory Visit: Payer: BC Managed Care – PPO | Admitting: Family Medicine

## 2023-08-30 ENCOUNTER — Encounter (HOSPITAL_BASED_OUTPATIENT_CLINIC_OR_DEPARTMENT_OTHER): Payer: BC Managed Care – PPO | Admitting: Physical Therapy

## 2023-09-04 ENCOUNTER — Ambulatory Visit: Payer: BC Managed Care – PPO

## 2023-09-06 ENCOUNTER — Ambulatory Visit (HOSPITAL_BASED_OUTPATIENT_CLINIC_OR_DEPARTMENT_OTHER): Payer: BC Managed Care – PPO | Admitting: Physical Therapy

## 2023-09-11 ENCOUNTER — Ambulatory Visit: Payer: BC Managed Care – PPO | Admitting: Rehabilitation

## 2023-09-15 ENCOUNTER — Ambulatory Visit (HOSPITAL_BASED_OUTPATIENT_CLINIC_OR_DEPARTMENT_OTHER): Payer: BC Managed Care – PPO | Admitting: Physical Therapy

## 2023-11-09 ENCOUNTER — Inpatient Hospital Stay: Payer: BC Managed Care – PPO | Attending: Hematology and Oncology | Admitting: Hematology and Oncology

## 2023-11-09 DIAGNOSIS — C50412 Malignant neoplasm of upper-outer quadrant of left female breast: Secondary | ICD-10-CM | POA: Diagnosis not present

## 2023-11-09 DIAGNOSIS — Z170 Estrogen receptor positive status [ER+]: Secondary | ICD-10-CM

## 2023-11-09 MED ORDER — LETROZOLE 2.5 MG PO TABS: ORAL_TABLET | ORAL | 3 refills | Status: DC

## 2023-11-09 NOTE — Assessment & Plan Note (Signed)
08/21/2018:Bilateral mastectomies: Left mastectomy: IDC grade 3, 2.1 cm, high-grade DCIS, margins negative, negative for LV I, 3/9 lymph nodes positive, ER 95% positive, PR 90% positive, HER-2 -1+, Ki-67 30% T2N1A stage IIa right mastectomy: Benign MammaPrint: High risk luminal type B Echocardiogram 09/18/2018: EF 55 to 60% Patient has been enrolled and upbeat clinical trial: No adverse effects related to the trial   Treatment plan: 1.  Adjuvant chemotherapy with dose dense Adriamycin and Cytoxan x4 followed by weekly Taxol x1 discontinued due to intolerance 2.  Followed by adjuvant radiation completed 04/20/2019 at [facility] 3.  Followed by adjuvant antiestrogen therapy started 05/03/2019 ------------------------------------------------------------------------------------------------------------------ Current Treatment: Anastrozole 1 mg daily started 05/03/2019 discontinued soon after because of nausea Currently on letrozole  Chest wall discomfort:CT chest 05/13/2021: No evidence of metastatic disease in the chest. On gabapentin 300 mg nightly.  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  She is excited about this new job which will offer her opportunity to exercise.   Breast cancer surveillance: 1.  Breast exams 11/08/22: Bilateral mastectomy scars tenderness in the left lower chest wall 2.  No role of routine imaging studies because she had bilateral mastectomies    Return to clinic in 1 year for follow-up

## 2023-11-09 NOTE — Progress Notes (Signed)
HEMATOLOGY-ONCOLOGY TELEPHONE VISIT PROGRESS NOTE  I connected with our patient on 11/09/23 at  9:30 AM EST by telephone and verified that I am speaking with the correct person using two identifiers.  I discussed the limitations, risks, security and privacy concerns of performing an evaluation and management service by telephone and the availability of in person appointments.  I also discussed with the patient that there may be a patient responsible charge related to this service. The patient expressed understanding and agreed to proceed.   History of Present Illness: Follow-up on antiestrogen therapy  History of Present Illness   Paula Jacobs is a 63 year old female who presents with concerns about weight gain and ongoing chest wall pain.  She is experiencing significant weight gain, which she attributes to her hormone therapy with letrozole. She has not started any new weight loss medications due to insurance coverage issues, including semaglutide [facility]) injections.  She has ongoing chest wall pain that varies in intensity, with periods of severe pain and times when it is absent. Previous scans have ruled out other causes. She was previously prescribed gabapentin for this pain but was unable to continue taking it.  She mentions that letrozole may also be contributing to increased joint pain, although she has a pre-existing condition of arthritis.  She is on letrozole for hormone therapy and inquires about refills, confirming the pharmacy details.  She has a history of an aneurysm that is monitored annually, and she believes it is time for her next check-up.        Oncology History  Malignant neoplasm of upper-outer quadrant of left breast in female, estrogen receptor positive (HCC)  07/13/2018 Initial Diagnosis    Palpable left breast mass, 2 adjacent masses at 2 o'clock position 2 cm biopsy IDC grade 2-3, Ig DCIS, ER 95%, PR 90%, Ki-67 30%, HER-2 -1+; 2:30 position intramammary  lymph node 1.3 cm biopsy positive; left axillary lymph node biopsy negative discordant, T2N1, stage IIa   07/25/2018 Cancer Staging   Staging form: Breast, AJCC 8th Edition - Clinical: Stage IIA (cT1c, cN1, cM0, G3, ER+, PR+, HER2-) - Signed by Serena Croissant, MD on 07/25/2018   08/08/2018 Genetic Testing   Genetic testing performed through [employer]'s Cancernext + RNA insight panel reported out on 08/08/2018 showed no pathogenic mutations. The CancerNext + RNA insight gene panel offered by [employer] includes sequencing and rearrangement analysis for the following 34 genes:   APC*, ATM*, BARD1, BMPR1A, BRCA1*, BRCA2*, BRIP1*, CDH1*, CDK4, CDKN2A, CHEK2*, DICER1,HOXB13, MLH1*, MRE11A, MSH2*, MSH6*, MUTYH*, NBN, NF1*, PALB2*, PMS2*, POLD1, POLE, PTEN*, RAD50, RAD51C*, RAD51D*, SMAD4, SMARCA4, STK11 and TP53* (sequencing and deletion/duplication); EPCAM and GREM1 (deletion/duplication only). DNA and RNA analyses performed for * genes   08/21/2018 Surgery   Bilateral mastectomies: Left mastectomy: IDC grade 3, 2.1 cm, high-grade DCIS, margins negative, negative for LV I, 3/9 lymph nodes positive, ER 95% positive, PR 90% positive, HER-2 -1+, Ki-67 30% T2N1A stage IIa right mastectomy: Benign   09/03/2018 Cancer Staging   Staging form: Breast, AJCC 8th Edition - Pathologic: Stage IIA (pT2, pN1a, cM0, G3, ER+, PR+, HER2-) - Signed by Serena Croissant, MD on 09/03/2018   09/03/2018 Miscellaneous   MammaPrint: High risk luminal type B, average 10-year risk of recurrence untreated 29%, predicted 5-year benefit of chemotherapy 94.6% distant metastasis free interval   09/26/2018 - 12/04/2018 Chemotherapy   Adjuvant chemotherapy with dose dense Adriamycin and Cytoxan x4 followed by weekly Taxol x1   03/19/2019 - 04/02/2019 Radiation Therapy  Adjuvant XRT at [facility] "Between 03/19/2019 and 04/02/2019 the patient received 5800 cGy in 29 fractions (of a planned 6000 cGy in 30 fractions) to the left chest wall  mastectomy scar region. Treatment initially encompassed the left chest wall and left supraclavicular/axillary region delivering 5000 cGy in 25 fractions. The left chest wall was treated using opposed tangential field in field approach with a combination of 6 MV and 23 MV photons. The left supraclavicular/axillary region was treated using a RAO/PA technique with 23 MV photons. This was followed by a boost to the left chest wall mastectomy scar region delivering 800 cGy in 4 fractions (of a planned 1000 cGy in 5 fractions) using an en face technique with a 9 MeV electron beam. The treatment course was completed over 42 elapsed days."   04/2019 -  Anti-estrogen oral therapy   Anastrozole daily, discontinued soon after starting because of nausea, switched to letrozole 03/19/20     REVIEW OF SYSTEMS:   Constitutional: Denies fevers, chills or abnormal weight loss All other systems were reviewed with the patient and are negative. Observations/Objective:     Assessment Plan:  Malignant neoplasm of upper-outer quadrant of left breast in female, estrogen receptor positive (HCC) 08/21/2018:Bilateral mastectomies: Left mastectomy: IDC grade 3, 2.1 cm, high-grade DCIS, margins negative, negative for LV I, 3/9 lymph nodes positive, ER 95% positive, PR 90% positive, HER-2 -1+, Ki-67 30% T2N1A stage IIa right mastectomy: Benign MammaPrint: High risk luminal type B Echocardiogram 09/18/2018: EF 55 to 60% Patient has been enrolled and upbeat clinical trial: No adverse effects related to the trial   Treatment plan: 1.  Adjuvant chemotherapy with dose dense Adriamycin and Cytoxan x4 followed by weekly Taxol x1 discontinued due to intolerance 2.  Followed by adjuvant radiation completed 04/20/2019 at [facility] 3.  Followed by adjuvant antiestrogen therapy started 05/03/2019 ------------------------------------------------------------------------------------------------------------------ Current Treatment: Anastrozole  1 mg daily started 05/03/2019 discontinued soon after because of nausea Currently on letrozole  Chest wall discomfort:CT chest 05/13/2021: No evidence of metastatic disease in the chest.  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  She is excited about this new job which will offer her opportunity to exercise.   Breast cancer surveillance: 1.  Breast exams 11/08/22: Bilateral mastectomy scars tenderness in the left lower chest wall 2.  No role of routine imaging studies because she had bilateral mastectomies    Return to clinic in 1 year for follow-up --------------------------------- Assessment and Plan    Weight Gain Significant weight gain attributed to letrozole and menopause. Discussed structured weight loss program ([employer]) and compounded weight loss injections, which are more affordable than Wegovy. Potential side effects include gastroparesis and nausea, generally well-tolerated. - Recommend [employer] for weight loss - Discuss compounded weight loss injections through [employer]  Arthritis Joint pain likely exacerbated by weight gain and letrozole.  Hormone Therapy On letrozole for hormone therapy. Inquired about refills. - Send a year's worth of letrozole prescription to [facility]  Chest Wall Pain Intermittent, sometimes severe chest wall pain. Previous scans ruled out other causes. Unable to continue gabapentin. - Discontinue gabapentin  Aneurysm Aneurysm monitored annually. Due for annual check-up. - Schedule MRI for aneurysm monitoring through general physician  Follow-up - Follow up in one year.          I discussed the assessment and treatment plan with the patient. The patient was provided an opportunity to ask questions and all were  answered. The patient agreed with the plan and demonstrated an understanding of the instructions. The patient was advised to call back or seek  an in-person evaluation if the symptoms worsen or if the condition fails to improve as anticipated.   I provided 20 minutes of non-face-to-face time during this encounter.  This includes time for charting and coordination of care   Tamsen Meek, MD

## 2024-01-02 DIAGNOSIS — J208 Acute bronchitis due to other specified organisms: Secondary | ICD-10-CM | POA: Diagnosis not present

## 2024-01-02 DIAGNOSIS — H6123 Impacted cerumen, bilateral: Secondary | ICD-10-CM | POA: Diagnosis not present

## 2024-02-23 DIAGNOSIS — W010XXA Fall on same level from slipping, tripping and stumbling without subsequent striking against object, initial encounter: Secondary | ICD-10-CM | POA: Diagnosis not present

## 2024-02-23 DIAGNOSIS — M25462 Effusion, left knee: Secondary | ICD-10-CM | POA: Diagnosis not present

## 2024-02-23 DIAGNOSIS — S8992XA Unspecified injury of left lower leg, initial encounter: Secondary | ICD-10-CM | POA: Diagnosis not present

## 2024-02-23 DIAGNOSIS — M25562 Pain in left knee: Secondary | ICD-10-CM | POA: Diagnosis not present

## 2024-04-15 ENCOUNTER — Ambulatory Visit: Payer: Self-pay

## 2024-04-15 NOTE — Telephone Encounter (Signed)
 FYI Only or Action Required?: FYI only for provider.  Patient was last seen in primary care on 06/21/2023 by Ozell Heron HERO, MD.  Called Nurse Triage reporting Weight Loss.  Symptoms began about a month ago.  Interventions attempted: Nothing.  Symptoms are: unchanged.  Triage Disposition: See Physician Within 24 Hours  Patient/caregiver understands and will follow disposition?: Yes   Copied from CRM #[id number]. Topic: Clinical - Red Word Triage >> Apr 15, 2024 12:21 PM Corin V wrote: [facility] that prompted transfer to Nurse Triage: Patient has had sudden weight loss. She has lost 18 pounds in the last month. She has history of breast cancer and is very concerned about that amount of weight loss. No changes in diet or exercise. She is unsure if it is just stress.  Reason for Disposition  [1] Night sweats occur (e.g., drenching sweat that occurs at night and has to change bed clothes or bed sheets) AND [2] cause unknown  Answer Assessment - Initial Assessment Questions 1. MAIN CONCERN: What is your main concern today?     Unintended Weight Loss  2. WEIGHT LOSS: How much weight have you lost?  (e.g., lbs., kgs.)  Over what period of time have you lost this weight?  (e.g., number of days, weeks, months, years)     Over one month, loss of 18 lbs.  3. BASELINE WEIGHT: What is your baseline or normal weight? (e.g., How much do you usually weigh?)     230 lbs  4. CAUSE: What do you think is causing the weight loss? (e.g., depression, anxiety, medicine side effect, pain, trouble swallowing, substance or alcohol use problem, eating disorder)     Unsure  5. PRIOR EVALUATION: Have you been evaluated by a doctor for your weight loss? If Yes, ask When was your last visit? What did your doctor (or NP/PA) tell you about the possible cause?     Orthopedics for a fall at work.  6. HEART FAILURE TREATMENT: Do you have heart failure? If Yes, ask: Have you taken new or extra  water  pills (diuretics) recently? (e.g., furosemide ; bumetanide). What is your target weight?     No  7. OTHER SYMPTOMS: Do you have any other symptoms? (e.g., anxiety or depression, blood in stool, breathing difficulty, diarrhea, fever, trouble swallowing)     Body Aches, Malaise  8. PREGNANCY: Is there any chance you are pregnant? When was your last menstrual period?     No and No  Protocols used: Weight Loss - Unintended-A-AH

## 2024-04-15 NOTE — Telephone Encounter (Signed)
 Appt on 7/29 with Dr Micheal

## 2024-04-15 NOTE — Telephone Encounter (Signed)
 Noted- ok to close.

## 2024-04-16 ENCOUNTER — Ambulatory Visit: Payer: Self-pay | Admitting: Family Medicine

## 2024-04-16 ENCOUNTER — Encounter: Payer: Self-pay | Admitting: Family Medicine

## 2024-04-16 ENCOUNTER — Ambulatory Visit (INDEPENDENT_AMBULATORY_CARE_PROVIDER_SITE_OTHER): Admitting: Family Medicine

## 2024-04-16 VITALS — BP 140/80 | HR 73 | Temp 98.0°F | Wt 212.2 lb

## 2024-04-16 DIAGNOSIS — R634 Abnormal weight loss: Secondary | ICD-10-CM | POA: Diagnosis not present

## 2024-04-16 DIAGNOSIS — E039 Hypothyroidism, unspecified: Secondary | ICD-10-CM

## 2024-04-16 DIAGNOSIS — Z853 Personal history of malignant neoplasm of breast: Secondary | ICD-10-CM | POA: Diagnosis not present

## 2024-04-16 LAB — COMPREHENSIVE METABOLIC PANEL WITH GFR
ALT: 11 U/L (ref 0–35)
AST: 16 U/L (ref 0–37)
Albumin: 4.5 g/dL (ref 3.5–5.2)
Alkaline Phosphatase: 58 U/L (ref 39–117)
BUN: 17 mg/dL (ref 6–23)
CO2: 30 meq/L (ref 19–32)
Calcium: 9.4 mg/dL (ref 8.4–10.5)
Chloride: 99 meq/L (ref 96–112)
Creatinine, Ser: 0.71 mg/dL (ref 0.40–1.20)
GFR: 91.75 mL/min (ref >60.00)
Glucose, Bld: 86 mg/dL (ref 70–99)
Potassium: 3.8 meq/L (ref 3.5–5.1)
Sodium: 140 meq/L (ref 135–145)
Total Bilirubin: 0.5 mg/dL (ref 0.2–1.2)
Total Protein: 7 g/dL (ref 6.0–8.3)

## 2024-04-16 LAB — SEDIMENTATION RATE: Sed Rate: 12 mm/h (ref 0–30)

## 2024-04-16 LAB — URINALYSIS
Bilirubin Urine: NEGATIVE
Hgb urine dipstick: NEGATIVE
Ketones, ur: NEGATIVE
Leukocytes,Ua: NEGATIVE
Nitrite: NEGATIVE
Specific Gravity, Urine: 1.02 (ref 1.000–1.030)
Total Protein, Urine: NEGATIVE
Urine Glucose: NEGATIVE
Urobilinogen, UA: 1 (ref 0.0–1.0)
pH: 6.5 (ref 5.0–8.0)

## 2024-04-16 LAB — CBC WITH DIFFERENTIAL/PLATELET
Basophils Absolute: 0.1 K/uL (ref 0.0–0.1)
Basophils Relative: 0.8 % (ref 0.0–3.0)
Eosinophils Absolute: 0.2 K/uL (ref 0.0–0.7)
Eosinophils Relative: 2.8 % (ref 0.0–5.0)
HCT: 37 % (ref 36.0–46.0)
Hemoglobin: 11.9 g/dL — ABNORMAL LOW (ref 12.0–15.0)
Lymphocytes Relative: 30.5 % (ref 12.0–46.0)
Lymphs Abs: 2 K/uL (ref 0.7–4.0)
MCHC: 32.2 g/dL (ref 30.0–36.0)
MCV: 83.6 fl (ref 78.0–100.0)
Monocytes Absolute: 0.5 K/uL (ref 0.1–1.0)
Monocytes Relative: 6.8 % (ref 3.0–12.0)
Neutro Abs: 4 K/uL (ref 1.4–7.7)
Neutrophils Relative %: 59.1 % (ref 43.0–77.0)
Platelets: 418 K/uL — ABNORMAL HIGH (ref 150.0–400.0)
RBC: 4.43 Mil/uL (ref 3.87–5.11)
RDW: 13.4 % (ref 11.5–15.5)
WBC: 6.7 K/uL (ref 4.0–10.5)

## 2024-04-16 LAB — TSH: TSH: 0.93 u[IU]/mL (ref 0.35–5.50)

## 2024-04-16 NOTE — Progress Notes (Signed)
 Established Patient Office Visit  Subjective   Patient ID: Paula Jacobs, female    DOB: [birth year]  Age: 63 y.o. MRN: [id number]  Chief Complaint  Patient presents with   Weight Loss    HPI   Paula Jacobs is seen today with unintentional weight loss.  Her past medical history significant for hypertension, hypothyroidism, degenerative arthritis, history of left breast cancer 2019 which was treated with chemotherapy and radiation and she had bilateral mastectomy then.  She is seen with weight loss reported of 230 pounds to 212 pounds in 1 month with declining appetite.  She also relates some diffuse bodyaches upper and lower extremities.  Possible low-grade fever.  Denies any headaches, abdominal pain, cough, shortness of breath, chest pain, night sweats, or lymphadenopathy.  No recent bloody stools, hematuria, nausea, vomiting, or diarrhea.  Current medications include levothyroxine , Wellbutrin  XL 150 mg daily, and Femara .  Colonoscopy up-to-date.  Previously took Ozempic but no GLP-1 use currently.  Past Medical History:  Diagnosis Date   ANEMIA-NOS 04/23/2008   Aneurysm (HCC) 10/19/2017   Evaluated by [facility] 10/2017, 2mm, stable   Anginal pain (HCC)    went to ED in april 2018 c/o chest pain over last 2 months ; had EKG, CXR  and troponin negative per physician suspected musculoskeletal  ; dc'd with ibuprofen   and recc f/u with outpatient stress test; see care everywhere ED visit  ; patient denies recurrence of Chest pain since, endorses occ palpitations    Anxiety    Cancer (HCC) 2019   left breast ca- had bil mast   DEPRESSION 04/23/2008   DJD (degenerative joint disease)    Family history of breast cancer    Hyperplastic colon polyp    x2   Hypertension    HYPOTHYROIDISM 10/17/2007   Osteoarthritis    Palpitations    freq at night;  at pre-op states she drinks caffeine and that makes it worse; says the palpitations started after they took her thyroid     PAT 10/27/2009    Qualifier: Diagnosis of  By: Rolan, MD, [name]     Pneumonia    in the past, havent had it in years   PONV (postoperative nausea and vomiting)    only after 1979 surgery   RA (rheumatoid arthritis) (HCC)    problems in feet, hands, and knees   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/05/2017   SVT (supraventricular tachycardia) (HCC)    chronic    Tubulovillous adenoma of colon    Past Surgical History:  Procedure Laterality Date   ABDOMINAL HYSTERECTOMY     APPENDECTOMY     BUNIONECTOMY  10/2009 right & 02/03/10 left   COLONOSCOPY  10/22/2021   with biopsy   COLONOSCOPY W/ POLYPECTOMY  09/19/2013   HAMMER TOE SURGERY     all toes have pins, done with bunionectomy   INGUINAL HERNIA REPAIR     KNEE CLOSED REDUCTION Left 12/05/2017   Procedure: CLOSED MANIPULATION LEFT KNEE;  Surgeon: Ernie Cough, MD;  Location: [facility];  Service: Orthopedics;  Laterality: Left;   MASTECTOMY WITH RADIOACTIVE SEED GUIDED EXCISION AND AXILLARY SENTINEL LYMPH NODE BIOPSY Bilateral 08/21/2018   Procedure: BILATERAL SIMPLE MASTECTOMIES WITH LEFT AXILLARY RADIOACTIVE SEED GUIDED LYMPH NODE EXCISION AND LEFT AXILLARY SENTINEL LYMPH NODE BIOPSY;  Surgeon: Vanderbilt Ned, MD;  Location: [facility];  Service: General;  Laterality: Bilateral;   neck fusion     x3   PORT-A-CATH REMOVAL Right 05/15/2019  Procedure: REMOVAL PORT-A-CATH;  Surgeon: Vanderbilt Ned, MD;  Location: [facility];  Service: General;  Laterality: Right;   PORTACATH PLACEMENT Right 09/25/2018   Procedure: INSERTION PORT-A-CATH WITH ULTRASOUND ERAS PATHWAY;  Surgeon: Vanderbilt Ned, MD;  Location: [facility];  Service: General;  Laterality: Right;   THYROIDECTOMY  09/20/1999   for nodules   TOE AMPUTATION  09/19/1977   6th toe removed from each foot   TOTAL KNEE ARTHROPLASTY Left 10/21/2014   Procedure: LEFT TOTAL KNEE ARTHROPLASTY;  Surgeon: Donnice JONETTA Car, MD;  Location: [facility];  Service: Orthopedics;   Laterality: Left;   TOTAL KNEE ARTHROPLASTY Right 12/05/2017   Procedure: RIGHT TOTAL KNEE ARTHROPLASTY;  Surgeon: Car Donnice, MD;  Location: [facility];  Service: Orthopedics;  Laterality: Right;  70 mins    reports that she quit smoking about 12 years ago. Her smoking use included cigarettes. She started smoking about 45 years ago. She has a 16.5 pack-year smoking history. She has been exposed to tobacco smoke. She has never used smokeless tobacco. She reports current alcohol use. She reports that she does not use drugs. family history includes Breast cancer (age of onset: 1) in her mother; COPD in her maternal aunt; Cancer in her brother, paternal uncle, and sister; Dementia in her father; Diabetes in her maternal grandmother; Heart attack in her maternal grandmother; Heart disease in her maternal grandmother; Schizophrenia in her brother; Stroke in her maternal grandmother; Thyroid  disease in her maternal grandmother. Allergies  Allergen Reactions   Compazine  [Prochlorperazine  Edisylate] Anxiety   Hydrocodone -Acetaminophen  Itching   Latex Itching   Tramadol Hcl Itching and Nausea Only    Review of Systems  Constitutional:  Positive for weight loss. Negative for chills.  Respiratory:  Negative for cough and shortness of breath.   Cardiovascular:  Negative for chest pain.  Gastrointestinal:  Negative for abdominal pain, blood in stool, constipation, diarrhea, melena, nausea and vomiting.  Genitourinary:  Negative for dysuria and hematuria.  Neurological:  Negative for headaches.      Objective:     BP (!) 140/80 (BP Location: Left Arm, Patient Position: Sitting, Cuff Size: Large)   Pulse 73   Temp 98 F (36.7 C) (Oral)   Wt 212 lb 3.2 oz (96.3 kg)   SpO2 95%   BMI 38.81 kg/m  BP Readings from Last 3 Encounters:  04/16/24 (!) 140/80  06/21/23 (!) 152/100  11/08/22 (!) 149/89   Wt Readings from Last 3 Encounters:  04/16/24 212 lb 3.2 oz (96.3 kg)  06/21/23 217 lb 3.2 oz  (98.5 kg)  11/08/22 220 lb 7 oz (100 kg)      Physical Exam Vitals reviewed.  Constitutional:      General: She is not in acute distress.    Appearance: She is well-developed.  HENT:     Mouth/Throat:     Mouth: Mucous membranes are moist.     Pharynx: Oropharynx is clear.  Eyes:     Pupils: Pupils are equal, round, and reactive to light.  Neck:     Thyroid : No thyromegaly.     Vascular: No JVD.  Cardiovascular:     Rate and Rhythm: Normal rate and regular rhythm.     Heart sounds:     No gallop.  Pulmonary:     Effort: Pulmonary effort is normal. No respiratory distress.     Breath sounds: Normal breath sounds. No wheezing or rales.  Abdominal:     Palpations: Abdomen is soft. There  is no mass.     Tenderness: There is no abdominal tenderness. There is no guarding or rebound.  Musculoskeletal:     Cervical back: Neck supple.  Lymphadenopathy:     Cervical: No cervical adenopathy.  Neurological:     Mental Status: She is alert.      No results found for any visits on 04/16/24.    The 10-year ASCVD risk score (Arnett DK, et al., 2019) is: 7.9%    Assessment & Plan:   Problem List Items Addressed This Visit       Unprioritized   Hypothyroidism   Relevant Orders   TSH   Other Visit Diagnoses       Unintentional weight loss    -  Primary   Relevant Orders   CBC with Differential/Platelet   CMP   TSH   Sedimentation rate   Urinalysis     History of left breast cancer         63 year old female seen with 1 month history of declining appetite and unintentional weight loss of almost 20 pounds.  Only other real symptom is diffuse aches.  This is not confined to neck and upper back but diffuse body aches.  -Obtain lab work as above - Given her past history of breast cancer and unintentional weight loss of 20 pounds set up CT chest, abdomen, and pelvis to further assess - Recommend close follow-up with primary if above unrevealing  No follow-ups on file.     Wolm Scarlet, MD

## 2024-04-17 ENCOUNTER — Telehealth: Payer: Self-pay

## 2024-04-17 NOTE — Telephone Encounter (Signed)
 Noted, Please see result note

## 2024-04-17 NOTE — Telephone Encounter (Signed)
 Copied from CRM [id number]. Topic: Clinical - Lab/Test Results >> Apr 17, 2024  9:12 AM [name] wrote: Reason for CRM: Patient called in regarding lab results, relay message to patient, patient stated she had no further questions and understood

## 2024-04-19 ENCOUNTER — Other Ambulatory Visit: Payer: Self-pay | Admitting: Family Medicine

## 2024-04-19 ENCOUNTER — Telehealth: Payer: Self-pay | Admitting: *Deleted

## 2024-04-19 DIAGNOSIS — E559 Vitamin D deficiency, unspecified: Secondary | ICD-10-CM

## 2024-04-19 NOTE — Telephone Encounter (Signed)
 Copied from CRM [id number]. Topic: General - Other >> Apr 19, 2024 11:02 AM [name] wrote: Reason for CRM: Consuelo from [facility] called in regarding patients authorization for scheduled ct next week, if have not been completed needs it by  Paula Jacobs  [phone]

## 2024-04-23 ENCOUNTER — Ambulatory Visit: Admitting: Family Medicine

## 2024-04-24 ENCOUNTER — Other Ambulatory Visit

## 2024-05-02 ENCOUNTER — Other Ambulatory Visit

## 2024-05-08 ENCOUNTER — Other Ambulatory Visit

## 2024-05-08 ENCOUNTER — Inpatient Hospital Stay: Admission: RE | Admit: 2024-05-08 | Source: Ambulatory Visit

## 2024-05-13 DIAGNOSIS — M1612 Unilateral primary osteoarthritis, left hip: Secondary | ICD-10-CM | POA: Diagnosis not present

## 2024-05-14 ENCOUNTER — Telehealth: Payer: Self-pay | Admitting: *Deleted

## 2024-05-14 ENCOUNTER — Other Ambulatory Visit: Payer: Self-pay | Admitting: *Deleted

## 2024-05-14 MED ORDER — LETROZOLE 2.5 MG PO TABS
ORAL_TABLET | ORAL | 3 refills | Status: AC
Start: 2024-05-14 — End: ?

## 2024-05-14 NOTE — Telephone Encounter (Signed)
 Pt returned call back stating she is having unexplained weight loss and would like CT scan placed. Advised pt that she has a current CT scan that she has to call and reschedule that was placed by her PCP related to her unexplained weight loss. Advised that if there is any cancer related issues found on the scan, her oncologist will f/u. Pt verbalized understanding and stated that she will call and schedule missed CT appt.

## 2024-05-14 NOTE — Telephone Encounter (Signed)
 Pt called and left vm that she is having unexplained weight lose and requesting imaging and needed refill for antiestrogen. Called pt yesterday and this morning and unable to leave a message due to vm being full. It appears that pt has had several appt for CT of chest and abd not have been cancelled or pt has no showed for. Awaiting callback from pt. Refill has been done.

## 2024-05-21 ENCOUNTER — Encounter: Payer: Self-pay | Admitting: Family Medicine

## 2024-05-23 ENCOUNTER — Ambulatory Visit
Admission: RE | Admit: 2024-05-23 | Discharge: 2024-05-23 | Disposition: A | Discharge status: Home / Self Care | Source: Ambulatory Visit | Attending: Family Medicine | Admitting: Family Medicine

## 2024-05-23 DIAGNOSIS — R634 Abnormal weight loss: Secondary | ICD-10-CM | POA: Diagnosis not present

## 2024-05-23 DIAGNOSIS — J439 Emphysema, unspecified: Secondary | ICD-10-CM | POA: Diagnosis not present

## 2024-05-23 DIAGNOSIS — Z853 Personal history of malignant neoplasm of breast: Secondary | ICD-10-CM

## 2024-05-23 MED ORDER — IOPAMIDOL (ISOVUE-370) INJECTION 76%: 75.0000 mL | Freq: Once | INTRAVENOUS | Status: DC | PRN

## 2024-05-23 MED ORDER — IOPAMIDOL (ISOVUE-370) INJECTION 76%
75.0000 mL | Freq: Once | INTRAVENOUS | Status: AC | PRN
  Administered 2024-05-23: 75 mL via INTRAVENOUS

## 2024-06-26 ENCOUNTER — Other Ambulatory Visit: Payer: Self-pay | Admitting: Family Medicine

## 2024-06-26 DIAGNOSIS — E039 Hypothyroidism, unspecified: Secondary | ICD-10-CM

## 2024-06-26 DIAGNOSIS — I10 Essential (primary) hypertension: Secondary | ICD-10-CM

## 2024-06-26 DIAGNOSIS — F321 Major depressive disorder, single episode, moderate: Secondary | ICD-10-CM

## 2024-07-01 ENCOUNTER — Other Ambulatory Visit: Payer: Self-pay | Admitting: Family Medicine

## 2024-07-01 NOTE — Telephone Encounter (Unsigned)
 Copied from CRM [id number]. Topic: Clinical - Medication Refill >> Jul 01, 2024  4:48 PM Nessti S wrote: Medication: buPROPion  (WELLBUTRIN  XL) 150 MG 24 hr tablet, benazepril -hydrochlorthiazide (LOTENSIN  HCT) 20-25 MG tablet, SYNTHROID  200 MCG tablet, Vitamin D , Ergocalciferol , (DRISDOL ) 1.25 MG (50000 UNIT) CAPS capsule, letrozole  (FEMARA ) 2.5 MG tablet  Has the patient contacted their pharmacy? Yes (Agent: If no, request that the patient contact the pharmacy for the refill. If patient does not wish to contact the pharmacy document the reason why and proceed with request.) (Agent: If yes, when and what did the pharmacy advise?)  This is the patient's preferred pharmacy:  [facility] [place], [place] - [address] [place] [address] Phone: [phone] Fax: [phone]  Is this the correct pharmacy for this prescription? Yes If no, delete pharmacy and type the correct one.   Has the prescription been filled recently? Yes  Is the patient out of the medication? No  Has the patient been seen for an appointment in the last year OR does the patient have an upcoming appointment? Yes  Can we respond through MyChart? No  Agent: Please be advised that Rx refills may take up to 3 business days. We ask that you follow-up with your pharmacy.

## 2024-08-08 DIAGNOSIS — H6122 Impacted cerumen, left ear: Secondary | ICD-10-CM | POA: Diagnosis not present

## 2024-08-08 DIAGNOSIS — H66001 Acute suppurative otitis media without spontaneous rupture of ear drum, right ear: Secondary | ICD-10-CM | POA: Diagnosis not present
